# Patient Record
Sex: Female | Born: 1935 | Race: White | Hispanic: No | Smoking: Never smoker
Health system: Southern US, Community
[De-identification: ages and names within clinical notes are randomized; demographics above are authoritative.]

## PROBLEM LIST (undated history)

## (undated) ENCOUNTER — Emergency Department (HOSPITAL_COMMUNITY): Admission: EM | Payer: Self-pay | Source: Home / Self Care

## (undated) DIAGNOSIS — J449 Chronic obstructive pulmonary disease, unspecified: Secondary | ICD-10-CM

## (undated) DIAGNOSIS — I4892 Unspecified atrial flutter: Secondary | ICD-10-CM

## (undated) DIAGNOSIS — K219 Gastro-esophageal reflux disease without esophagitis: Secondary | ICD-10-CM

## (undated) DIAGNOSIS — Z0389 Encounter for observation for other suspected diseases and conditions ruled out: Secondary | ICD-10-CM

## (undated) DIAGNOSIS — C44301 Unspecified malignant neoplasm of skin of nose: Secondary | ICD-10-CM

## (undated) DIAGNOSIS — M25512 Pain in left shoulder: Secondary | ICD-10-CM

## (undated) DIAGNOSIS — I5042 Chronic combined systolic (congestive) and diastolic (congestive) heart failure: Secondary | ICD-10-CM

## (undated) DIAGNOSIS — F419 Anxiety disorder, unspecified: Secondary | ICD-10-CM

## (undated) DIAGNOSIS — I350 Nonrheumatic aortic (valve) stenosis: Secondary | ICD-10-CM

## (undated) DIAGNOSIS — I639 Cerebral infarction, unspecified: Secondary | ICD-10-CM

## (undated) DIAGNOSIS — J189 Pneumonia, unspecified organism: Secondary | ICD-10-CM

## (undated) DIAGNOSIS — M1990 Unspecified osteoarthritis, unspecified site: Secondary | ICD-10-CM

## (undated) DIAGNOSIS — M419 Scoliosis, unspecified: Secondary | ICD-10-CM

## (undated) DIAGNOSIS — I4821 Permanent atrial fibrillation: Secondary | ICD-10-CM

## (undated) DIAGNOSIS — S06309A Unspecified focal traumatic brain injury with loss of consciousness of unspecified duration, initial encounter: Secondary | ICD-10-CM

## (undated) DIAGNOSIS — IMO0001 Reserved for inherently not codable concepts without codable children: Secondary | ICD-10-CM

## (undated) DIAGNOSIS — J309 Allergic rhinitis, unspecified: Secondary | ICD-10-CM

## (undated) DIAGNOSIS — IMO0002 Reserved for concepts with insufficient information to code with codable children: Secondary | ICD-10-CM

## (undated) HISTORY — DX: Permanent atrial fibrillation: I48.21

## (undated) HISTORY — DX: Pain in left shoulder: M25.512

## (undated) HISTORY — PX: TONSILLECTOMY: SUR1361

## (undated) HISTORY — DX: Encounter for observation for other suspected diseases and conditions ruled out: Z03.89

## (undated) HISTORY — PX: MOHS SURGERY: SUR867

## (undated) HISTORY — DX: Nonrheumatic aortic (valve) stenosis: I35.0

## (undated) HISTORY — DX: Reserved for inherently not codable concepts without codable children: IMO0001

## (undated) HISTORY — DX: Chronic combined systolic (congestive) and diastolic (congestive) heart failure: I50.42

## (undated) HISTORY — PX: DILATION AND CURETTAGE OF UTERUS: SHX78

## (undated) HISTORY — DX: Cerebral infarction, unspecified: I63.9

## (undated) HISTORY — PX: CATARACT EXTRACTION W/ INTRAOCULAR LENS  IMPLANT, BILATERAL: SHX1307

## (undated) HISTORY — DX: Unspecified atrial flutter: I48.92

---

## 1997-08-26 ENCOUNTER — Ambulatory Visit (HOSPITAL_COMMUNITY): Admission: RE | Admit: 1997-08-26 | Discharge: 1997-08-26 | Payer: Self-pay | Admitting: Internal Medicine

## 1998-03-21 ENCOUNTER — Ambulatory Visit (HOSPITAL_BASED_OUTPATIENT_CLINIC_OR_DEPARTMENT_OTHER): Admission: RE | Admit: 1998-03-21 | Discharge: 1998-03-21 | Payer: Self-pay | Admitting: Plastic Surgery

## 1998-07-01 HISTORY — PX: SHOULDER ARTHROSCOPY: SHX128

## 1998-09-12 ENCOUNTER — Encounter: Payer: Self-pay | Admitting: Internal Medicine

## 1998-09-12 ENCOUNTER — Ambulatory Visit (HOSPITAL_COMMUNITY): Admission: RE | Admit: 1998-09-12 | Discharge: 1998-09-12 | Payer: Self-pay | Admitting: Internal Medicine

## 1999-01-08 ENCOUNTER — Other Ambulatory Visit: Admission: RE | Admit: 1999-01-08 | Discharge: 1999-01-08 | Payer: Self-pay | Admitting: Internal Medicine

## 1999-09-26 ENCOUNTER — Encounter: Payer: Self-pay | Admitting: Internal Medicine

## 1999-09-26 ENCOUNTER — Ambulatory Visit (HOSPITAL_COMMUNITY): Admission: RE | Admit: 1999-09-26 | Discharge: 1999-09-26 | Payer: Self-pay | Admitting: Internal Medicine

## 2000-01-18 ENCOUNTER — Ambulatory Visit (HOSPITAL_COMMUNITY): Admission: RE | Admit: 2000-01-18 | Discharge: 2000-01-18 | Payer: Self-pay | Admitting: Internal Medicine

## 2000-09-15 ENCOUNTER — Ambulatory Visit (HOSPITAL_COMMUNITY): Admission: RE | Admit: 2000-09-15 | Discharge: 2000-09-15 | Payer: Self-pay | Admitting: General Surgery

## 2001-02-06 ENCOUNTER — Other Ambulatory Visit: Admission: RE | Admit: 2001-02-06 | Discharge: 2001-02-06 | Payer: Self-pay | Admitting: Internal Medicine

## 2001-05-15 ENCOUNTER — Encounter: Admission: RE | Admit: 2001-05-15 | Discharge: 2001-05-15 | Payer: Self-pay | Admitting: Internal Medicine

## 2001-05-15 ENCOUNTER — Encounter: Payer: Self-pay | Admitting: Internal Medicine

## 2001-06-03 ENCOUNTER — Encounter: Admission: RE | Admit: 2001-06-03 | Discharge: 2001-06-03 | Payer: Self-pay | Admitting: Internal Medicine

## 2001-06-03 ENCOUNTER — Encounter: Payer: Self-pay | Admitting: Internal Medicine

## 2001-07-01 HISTORY — PX: HERNIA REPAIR: SHX51

## 2001-12-10 ENCOUNTER — Encounter: Admission: RE | Admit: 2001-12-10 | Discharge: 2001-12-10 | Payer: Self-pay | Admitting: Orthopedic Surgery

## 2001-12-10 ENCOUNTER — Encounter: Payer: Self-pay | Admitting: Orthopedic Surgery

## 2001-12-15 ENCOUNTER — Ambulatory Visit (HOSPITAL_BASED_OUTPATIENT_CLINIC_OR_DEPARTMENT_OTHER): Admission: RE | Admit: 2001-12-15 | Discharge: 2001-12-15 | Payer: Self-pay | Admitting: Orthopedic Surgery

## 2002-07-06 ENCOUNTER — Ambulatory Visit (HOSPITAL_BASED_OUTPATIENT_CLINIC_OR_DEPARTMENT_OTHER): Admission: RE | Admit: 2002-07-06 | Discharge: 2002-07-06 | Payer: Self-pay | Admitting: Orthopaedic Surgery

## 2003-11-15 ENCOUNTER — Other Ambulatory Visit: Admission: RE | Admit: 2003-11-15 | Discharge: 2003-11-15 | Payer: Self-pay | Admitting: Obstetrics and Gynecology

## 2004-02-13 ENCOUNTER — Encounter: Admission: RE | Admit: 2004-02-13 | Discharge: 2004-02-13 | Payer: Self-pay | Admitting: Orthopedic Surgery

## 2004-02-15 ENCOUNTER — Ambulatory Visit (HOSPITAL_COMMUNITY): Admission: RE | Admit: 2004-02-15 | Discharge: 2004-02-15 | Payer: Self-pay | Admitting: Orthopedic Surgery

## 2004-02-15 ENCOUNTER — Ambulatory Visit (HOSPITAL_BASED_OUTPATIENT_CLINIC_OR_DEPARTMENT_OTHER): Admission: RE | Admit: 2004-02-15 | Discharge: 2004-02-15 | Payer: Self-pay | Admitting: Orthopedic Surgery

## 2005-11-08 ENCOUNTER — Encounter: Admission: RE | Admit: 2005-11-08 | Discharge: 2005-11-08 | Payer: Self-pay | Admitting: Internal Medicine

## 2005-11-27 ENCOUNTER — Encounter: Admission: RE | Admit: 2005-11-27 | Discharge: 2005-11-27 | Payer: Self-pay | Admitting: Internal Medicine

## 2006-09-18 ENCOUNTER — Ambulatory Visit (HOSPITAL_COMMUNITY): Admission: RE | Admit: 2006-09-18 | Discharge: 2006-09-18 | Payer: Self-pay | Admitting: Internal Medicine

## 2006-12-25 ENCOUNTER — Encounter: Admission: RE | Admit: 2006-12-25 | Discharge: 2006-12-25 | Payer: Self-pay | Admitting: Internal Medicine

## 2008-01-07 ENCOUNTER — Encounter: Admission: RE | Admit: 2008-01-07 | Discharge: 2008-01-07 | Payer: Self-pay | Admitting: *Deleted

## 2009-01-13 ENCOUNTER — Encounter: Admission: RE | Admit: 2009-01-13 | Discharge: 2009-01-13 | Payer: Self-pay | Admitting: *Deleted

## 2009-06-28 ENCOUNTER — Ambulatory Visit: Payer: Self-pay | Admitting: Internal Medicine

## 2009-06-28 ENCOUNTER — Telehealth: Payer: Self-pay | Admitting: Internal Medicine

## 2009-06-28 ENCOUNTER — Ambulatory Visit (HOSPITAL_BASED_OUTPATIENT_CLINIC_OR_DEPARTMENT_OTHER): Admission: RE | Admit: 2009-06-28 | Discharge: 2009-06-28 | Payer: Self-pay | Admitting: Internal Medicine

## 2009-06-28 ENCOUNTER — Ambulatory Visit: Payer: Self-pay | Admitting: Radiology

## 2009-06-28 DIAGNOSIS — M5430 Sciatica, unspecified side: Secondary | ICD-10-CM

## 2009-06-28 DIAGNOSIS — Z85828 Personal history of other malignant neoplasm of skin: Secondary | ICD-10-CM

## 2009-06-28 DIAGNOSIS — R0989 Other specified symptoms and signs involving the circulatory and respiratory systems: Secondary | ICD-10-CM | POA: Insufficient documentation

## 2009-06-28 DIAGNOSIS — R519 Headache, unspecified: Secondary | ICD-10-CM | POA: Insufficient documentation

## 2009-06-28 DIAGNOSIS — M25519 Pain in unspecified shoulder: Secondary | ICD-10-CM | POA: Insufficient documentation

## 2009-06-28 DIAGNOSIS — J309 Allergic rhinitis, unspecified: Secondary | ICD-10-CM | POA: Insufficient documentation

## 2009-06-28 DIAGNOSIS — R0609 Other forms of dyspnea: Secondary | ICD-10-CM

## 2009-06-28 DIAGNOSIS — R51 Headache: Secondary | ICD-10-CM

## 2009-07-04 ENCOUNTER — Ambulatory Visit: Payer: Self-pay | Admitting: Internal Medicine

## 2009-07-05 ENCOUNTER — Encounter: Payer: Self-pay | Admitting: Internal Medicine

## 2009-07-19 ENCOUNTER — Encounter: Payer: Self-pay | Admitting: Internal Medicine

## 2009-08-03 ENCOUNTER — Encounter: Payer: Self-pay | Admitting: Internal Medicine

## 2009-08-03 ENCOUNTER — Encounter: Admission: RE | Admit: 2009-08-03 | Discharge: 2009-08-03 | Payer: Self-pay | Admitting: Sports Medicine

## 2009-08-28 ENCOUNTER — Encounter: Admission: RE | Admit: 2009-08-28 | Discharge: 2009-08-28 | Payer: Self-pay | Admitting: Sports Medicine

## 2009-10-09 ENCOUNTER — Encounter: Payer: Self-pay | Admitting: Internal Medicine

## 2009-10-09 LAB — CONVERTED CEMR LAB
Basophils Absolute: 0.1 10*3/uL (ref 0.0–0.1)
Basophils Relative: 1 % (ref 0–1)
Bilirubin, Direct: 0.1 mg/dL (ref 0.0–0.3)
Calcium: 9.5 mg/dL (ref 8.4–10.5)
Casts: NONE SEEN /lpf
Cholesterol: 247 mg/dL — ABNORMAL HIGH (ref 0–200)
Eosinophils Absolute: 0.3 10*3/uL (ref 0.0–0.7)
Glucose, Bld: 97 mg/dL (ref 70–99)
HDL: 48 mg/dL (ref >39)
Lymphs Abs: 1 10*3/uL (ref 0.7–4.0)
Monocytes Relative: 16 % — ABNORMAL HIGH (ref 3–12)
Neutro Abs: 5.3 10*3/uL (ref 1.7–7.7)
Platelets: 370 10*3/uL (ref 150–400)
Protein, ur: NEGATIVE mg/dL
RBC / HPF: NONE SEEN (ref <3)
Squamous Epithelial / LPF: NONE SEEN /lpf
Total Bilirubin: 0.5 mg/dL (ref 0.3–1.2)
Total CHOL/HDL Ratio: 5.1
Triglycerides: 150 mg/dL — ABNORMAL HIGH (ref <150)
Urine Glucose: NEGATIVE mg/dL
Urobilinogen, UA: 0.2 (ref 0.0–1.0)
WBC: 8 10*3/uL (ref 4.0–10.5)
pH: 6.5 (ref 5.0–8.0)

## 2009-10-10 ENCOUNTER — Telehealth: Payer: Self-pay | Admitting: Internal Medicine

## 2009-10-12 ENCOUNTER — Ambulatory Visit (HOSPITAL_BASED_OUTPATIENT_CLINIC_OR_DEPARTMENT_OTHER): Admission: RE | Admit: 2009-10-12 | Discharge: 2009-10-12 | Payer: Self-pay | Admitting: Internal Medicine

## 2009-10-12 ENCOUNTER — Telehealth: Payer: Self-pay | Admitting: Internal Medicine

## 2009-10-12 ENCOUNTER — Ambulatory Visit: Payer: Self-pay | Admitting: Internal Medicine

## 2009-10-12 ENCOUNTER — Ambulatory Visit: Payer: Self-pay | Admitting: Interventional Radiology

## 2009-10-12 DIAGNOSIS — R079 Chest pain, unspecified: Secondary | ICD-10-CM

## 2009-10-25 ENCOUNTER — Ambulatory Visit (HOSPITAL_COMMUNITY): Admission: RE | Admit: 2009-10-25 | Discharge: 2009-10-25 | Payer: Self-pay | Admitting: Internal Medicine

## 2009-10-26 ENCOUNTER — Telehealth (INDEPENDENT_AMBULATORY_CARE_PROVIDER_SITE_OTHER): Payer: Self-pay | Admitting: *Deleted

## 2009-10-31 ENCOUNTER — Ambulatory Visit (HOSPITAL_COMMUNITY): Admission: RE | Admit: 2009-10-31 | Discharge: 2009-10-31 | Payer: Self-pay | Admitting: Internal Medicine

## 2009-10-31 ENCOUNTER — Ambulatory Visit: Payer: Self-pay

## 2009-10-31 ENCOUNTER — Ambulatory Visit: Payer: Self-pay | Admitting: Cardiology

## 2009-11-02 ENCOUNTER — Ambulatory Visit: Payer: Self-pay | Admitting: Internal Medicine

## 2009-11-03 ENCOUNTER — Encounter: Payer: Self-pay | Admitting: Internal Medicine

## 2009-11-06 ENCOUNTER — Telehealth: Payer: Self-pay | Admitting: Internal Medicine

## 2009-11-16 ENCOUNTER — Ambulatory Visit: Payer: Self-pay | Admitting: Internal Medicine

## 2009-11-16 LAB — CONVERTED CEMR LAB

## 2010-01-03 ENCOUNTER — Encounter: Admission: RE | Admit: 2010-01-03 | Discharge: 2010-01-03 | Payer: Self-pay | Admitting: Internal Medicine

## 2010-01-08 ENCOUNTER — Telehealth: Payer: Self-pay | Admitting: Internal Medicine

## 2010-01-09 ENCOUNTER — Encounter: Payer: Self-pay | Admitting: Internal Medicine

## 2010-01-09 ENCOUNTER — Encounter: Admission: RE | Admit: 2010-01-09 | Discharge: 2010-01-09 | Payer: Self-pay | Admitting: Internal Medicine

## 2010-03-29 ENCOUNTER — Ambulatory Visit: Payer: Self-pay | Admitting: Internal Medicine

## 2010-03-29 DIAGNOSIS — F411 Generalized anxiety disorder: Secondary | ICD-10-CM

## 2010-03-29 LAB — CONVERTED CEMR LAB
Calcium: 9.6 mg/dL (ref 8.4–10.5)
Creatinine, Ser: 0.79 mg/dL (ref 0.40–1.20)
HCT: 45.1 % (ref 36.0–46.0)
MCHC: 33.5 g/dL (ref 30.0–36.0)
Magnesium: 2.1 mg/dL (ref 1.5–2.5)
Platelets: 283 10*3/uL (ref 150–400)
WBC: 8.1 10*3/uL (ref 4.0–10.5)

## 2010-03-30 ENCOUNTER — Encounter: Payer: Self-pay | Admitting: Internal Medicine

## 2010-04-04 ENCOUNTER — Ambulatory Visit: Payer: Self-pay | Admitting: Internal Medicine

## 2010-04-05 ENCOUNTER — Telehealth: Payer: Self-pay | Admitting: Internal Medicine

## 2010-04-09 ENCOUNTER — Telehealth: Payer: Self-pay | Admitting: Internal Medicine

## 2010-04-18 ENCOUNTER — Encounter: Payer: Self-pay | Admitting: Internal Medicine

## 2010-04-18 ENCOUNTER — Ambulatory Visit: Payer: Self-pay | Admitting: Internal Medicine

## 2010-05-01 ENCOUNTER — Telehealth: Payer: Self-pay | Admitting: Internal Medicine

## 2010-05-18 ENCOUNTER — Ambulatory Visit: Payer: Self-pay | Admitting: Internal Medicine

## 2010-05-18 ENCOUNTER — Encounter: Payer: Self-pay | Admitting: Internal Medicine

## 2010-05-23 ENCOUNTER — Telehealth: Payer: Self-pay | Admitting: Internal Medicine

## 2010-05-31 DIAGNOSIS — M25512 Pain in left shoulder: Secondary | ICD-10-CM

## 2010-05-31 HISTORY — DX: Pain in left shoulder: M25.512

## 2010-06-08 ENCOUNTER — Encounter
Admission: RE | Admit: 2010-06-08 | Discharge: 2010-06-08 | Payer: Self-pay | Source: Home / Self Care | Attending: Sports Medicine | Admitting: Sports Medicine

## 2010-06-08 ENCOUNTER — Encounter: Payer: Self-pay | Admitting: Internal Medicine

## 2010-06-26 ENCOUNTER — Encounter: Payer: Self-pay | Admitting: Internal Medicine

## 2010-06-27 ENCOUNTER — Telehealth: Payer: Self-pay | Admitting: Internal Medicine

## 2010-06-29 ENCOUNTER — Encounter
Admission: RE | Admit: 2010-06-29 | Discharge: 2010-06-29 | Payer: Self-pay | Source: Home / Self Care | Attending: Internal Medicine | Admitting: Internal Medicine

## 2010-06-29 LAB — HM MAMMOGRAPHY

## 2010-07-03 ENCOUNTER — Telehealth: Payer: Self-pay | Admitting: Internal Medicine

## 2010-07-12 ENCOUNTER — Ambulatory Visit
Admission: RE | Admit: 2010-07-12 | Discharge: 2010-07-12 | Payer: Self-pay | Source: Home / Self Care | Attending: Internal Medicine | Admitting: Internal Medicine

## 2010-07-22 ENCOUNTER — Encounter: Payer: Self-pay | Admitting: Internal Medicine

## 2010-07-22 ENCOUNTER — Encounter: Payer: Self-pay | Admitting: Obstetrics and Gynecology

## 2010-07-22 ENCOUNTER — Encounter: Payer: Self-pay | Admitting: *Deleted

## 2010-08-02 NOTE — Progress Notes (Signed)
Summary: Blood pressure status update  Phone Note Call from Patient Call back at Home Phone [phone]   Caller: Patient Call For: D. Thomos Lemons DO Summary of Call: patient called and left voice message stating she was advised to call back regarding her blood pressure. She states her blood pressure was 100/80, 81/64, 133/65 on Saturday and yesterday 106/71. Her message states the medication is causing her severe fatigue and she wanted to know if she should continue with taking the medication as prescribed or should she stop Initial call taken by: Glendell Docker CMA,  July 03, 2010 11:50 AM  Follow-up for Phone Call        I suggest pt go back to taking 25 mg of metoprolol.  needs OV within 1 week Follow-up by: D. Thomos Lemons DO,  July 03, 2010 6:12 PM  Additional Follow-up for Phone Call Additional follow up Details #1::        call was returned to patient at [phone], she has been informed per Dr Artist Pais instructions. She has scheduled follow up for Thursday 1/12 @ 11 am with Dr Artist Pais Additional Follow-up by: Glendell Docker CMA,  July 04, 2010 10:54 AM    New/Updated Medications: METOPROLOL SUCCINATE 25 MG XR24H-TAB (METOPROLOL SUCCINATE) one by mouth once daily Prescriptions: METOPROLOL SUCCINATE 25 MG XR24H-TAB (METOPROLOL SUCCINATE) one by mouth once daily  #30 x 1   Entered and Authorized by:   D. Thomos Lemons DO   Signed by:   D. Thomos Lemons DO on 07/03/2010   Method used:   Electronically to        [id number] (retail)       [address]       [place], [place]  [address]       Ph: [phone] or [phone]       Fax: [phone]   RxID:   [id number]

## 2010-08-02 NOTE — Miscellaneous (Signed)
Summary: Orders Update pft charges  Clinical Lists Changes  Orders: Added new Service order of Carbon Monoxide diffusing w/capacity (94720) - Signed Added new Service order of Lung Volumes (94240) - Signed Added new Service order of Spirometry (Pre & Post) (94060) - Signed 

## 2010-08-02 NOTE — Progress Notes (Signed)
Summary: Mammogram Follow up   Phone Note Outgoing Call   Summary of Call: please make sure pt was contacted re:  abnormal mammogram Initial call taken by: D. Thomos Lemons DO,  January 08, 2010 11:57 PM  Follow-up for Phone Call        call placed to patient at [phone], she states she was contacted, and is scheduled to follow up today 01/09/2010. She was asked to call back with an update when completed. Patient verbalized understanding and agrees Follow-up by: Glendell Docker CMA,  January 09, 2010 8:41 AM

## 2010-08-02 NOTE — Progress Notes (Signed)
  Phone Note Outgoing Call   Summary of Call: call pt - bone scan and rib xray suggests left 11 th rib fracture Initial call taken by: D. Thomos Lemons DO,  October 26, 2009 8:10 AM  Follow-up for Phone Call        Pt aware Follow-up by: Payton Spark CMA,  October 26, 2009 9:12 AM

## 2010-08-02 NOTE — Op Note (Signed)
Summary: Lumbar Injection/[facility]  Lumbar Injection/[facility]   Imported By: Lanelle Bal 07/31/2009 13:41:29  _____________________________________________________________________  External Attachment:    Type:   Image     Comment:   External Document

## 2010-08-02 NOTE — Assessment & Plan Note (Signed)
Summary: RIGHT LEG PAIN/HEA   Vital Signs:  Patient profile:   77 year old female Weight:      148.75 pounds BMI:     28.21 O2 Sat:      98 % on Room air Temp:     97.8 degrees F oral Pulse rate:   72 / minute Pulse rhythm:   regular Resp:     18 per minute BP sitting:   126 / 72  (right arm) Cuff size:   regular  Vitals Entered By: Glendell Docker CMA (July 04, 2009 9:11 AM)  O2 Flow:  Room air  Primary Care Provider:  D. Thomos Lemons DO  CC:  Right Leg Pain.  History of Present Illness: Right Leg Pain- form knee down to thigh sharp constant pain for the past 2 weeks with increase in intensity.  no sign improvement with prednisone.  Preventive Screening-Counseling & Management  Alcohol-Tobacco     Alcohol drinks/day: 1     Alcohol type: all     Alcohol Counseling: not indicated; patient does not drink     Smoking Status: never     Tobacco Counseling: not indicated; no tobacco use  Caffeine-Diet-Exercise     Caffeine use/day: None     Caffeine Counseling: not indicated; caffeine use is not excessive or problematic     Does Patient Exercise: yes     Type of exercise: walking  Allergies (verified): No Known Drug Allergies  Past History:  Past Medical History: Chickenpox Allergic rhinitis Skin cancer, hx of (nose)  Headache  Past Surgical History: Hernia 2003   Social History: Caffeine use/day:  None  Physical Exam  General:  alert, well-developed, and well-nourished.   Lungs:  normal respiratory effort, normal breath sounds, no crackles, and no wheezes.   Heart:  normal rate, regular rhythm, and no gallop.   Msk:  tenderness of right sciatic notch.  mild decrease in ROM of left shoulder  right knee - normal ROM,  no swelling,  no joint instabilty Neurologic:  right patellar reflex is diminished.   Impression & Recommendations:  Problem # 1:  SCIATICA, RIGHT (ICD-724.3) Assessment Unchanged  no improvement with prednisone.  she reports right  leg is getting weaker.  patellar reflex is diminished on right side.  arrange MRI of LS spine.  she is claustrophobic.  low dose alprazolam 15-20 mins before MRI.    Orders: Orthopedic Referral (Ortho) Radiology Referral (Radiology)  Problem # 2:  SHOULDER PAIN, LEFT (ICD-719.41)  left shoulder x ray shows - There are prominent degenerative changes of the [facility] joint with preservation of the subacromial space.  If there is clinical concern regarding a rotator cuff injury, MRI may be of help.  she has hx of right shoulder surgery.  she has been relying on left arm.    Refer to [facility] ortho for further evaluation.  I would like for them to also review MRI of LS spine and evaluate right sciatica  Orders: Orthopedic Referral (Ortho)  Complete Medication List: 1)  Multivitamins Tabs (Multiple vitamin) .... Once daily 2)  Calcium 1500 Mg Tabs (Calcium carbonate) .... Once daily 3)  Fexofenadine Hcl 180 Mg Tabs (Fexofenadine hcl) .Marland Kitchen.. 1 once daily as needed allergies 4)  Prednisone 10 Mg Tabs (Prednisone) .... 3 tabs by mouth once daily x 3 days, 2 tabs by mouth once daily x 3 days, 1 tab by mouth once daily x 3 days 5)  Alprazolam 0.25 Mg Tabs (Alprazolam) .... One tab  15-20 mins before mri Prescriptions: ALPRAZOLAM 0.25 MG TABS (ALPRAZOLAM) one tab 15-20 mins before MRI  #5 x 0   Entered and Authorized by:   D. Thomos Lemons DO   Signed by:   D. Thomos Lemons DO on 07/04/2009   Method used:   Print then Give to Patient   RxID:   [id number]    Immunization History:  Tetanus/Td Immunization History:    Tetanus/Td:  historical (10/13/2000)  Influenza Immunization History:    Influenza:  historical (05/16/2009)  Pneumovax Immunization History:    Pneumovax:  historical (11/14/1998)  Zostavax History:    Zostavax # 1:  zostavax (01/20/2007)    Preventive Care Screening  Last Flu Shot:    Date:  05/16/2009    Results:  Historical   Bone Density:    Date:  03/24/2007     Results:  normal std dev  Colonoscopy:    Date:  03/24/2007    Results:  normal   Last Tetanus Booster:    Date:  10/13/2000    Results:  Historical   Last Pneumovax:    Date:  11/14/1998    Results:  Historical    Current Allergies (reviewed today): No known allergies

## 2010-08-02 NOTE — Assessment & Plan Note (Signed)
Summary: FOLLOW UP/[facility]   Vital Signs:  Patient profile:   77 year old female Height:      61 inches Weight:      143 pounds BMI:     27.12 O2 Sat:      96 % on Room air Pulse rate:   78 / minute BP sitting:   130 / 82  (left arm) Cuff size:   regular  Vitals Entered By: Payton Spark CMA (May 18, 2010 10:07 AM)  O2 Flow:  Room air CC: 1 mo f/u.    Primary Care Provider:  [name] DO  CC:  1 mo f/u. Marland Kitchen  History of Present Illness:  77 y/o white female with htn and afib for follow pt not taking aspirin regularly.  some stomach  upset  BP at home 130/82  Current Medications (verified): 1)  Fexofenadine Hcl 180 Mg Tabs (Fexofenadine Hcl) .... Take 1 Tablet By Mouth Once A Day As Needed 2)  Metoprolol Succinate 25 Mg Xr24h-Tab (Metoprolol Succinate) .... One By Mouth Once Daily 3)  Ecotrin 325 Mg Tbec (Aspirin) .... One By Mouth Once Daily  Allergies (verified): No Known Drug Allergies  Past History:  Past Medical History: Chickenpox  Allergic rhinitis  Skin cancer, hx of (nose)    Headache  Diagnosed with mild asthma 2000 - tried advair after bronchospasm after exposure to cats  Past Surgical History: Hernia 2003    Decompression, left median nerve  Right shoulder arthroscopic acromioplasty  Family History: Unknown       Social History: Married Never Smoked Alcohol use-yes Drug use-no Regular exercise-yes        Physical Exam  General:  alert, well-developed, and well-nourished.   Lungs:  normal respiratory effort and normal breath sounds.   Heart:  normal rate, no gallop, and irregular rhythm.   Extremities:  No lower extremity edema   Impression & Recommendations:  Problem # 1:  ATRIAL FIBRILLATION (ICD-427.31) Assessment Unchanged pt not able to take aspirin consistently due to stomach upset rate is controlled pt advised to reconsider taking coumadin   The following medications were removed from the medication list:  Ecotrin 325 Mg Tbec (Aspirin) ..... One by mouth once daily Her updated medication list for this problem includes:    Metoprolol Succinate 50 Mg Xr24h-tab (Metoprolol succinate) ..... One by mouth once daily  Problem # 2:  ALLERGIC RHINITIS (ICD-477.9) Assessment: Improved  Her updated medication list for this problem includes:    Fexofenadine Hcl 180 Mg Tabs (Fexofenadine hcl) .Marland Kitchen... Take 1 tablet by mouth once a day as needed  Complete Medication List: 1)  Fexofenadine Hcl 180 Mg Tabs (Fexofenadine hcl) .... Take 1 tablet by mouth once a day as needed 2)  Metoprolol Succinate 50 Mg Xr24h-tab (Metoprolol succinate) .... One by mouth once daily  Other Orders: EKG w/ Interpretation (93000)  Patient Instructions: 1)  Please schedule a follow-up appointment in 2 months. Prescriptions: FEXOFENADINE HCL 180 MG TABS (FEXOFENADINE HCL) Take 1 tablet by mouth once a day as needed  #90 x 3   Entered and Authorized by:   D. Thomos Lemons DO   Signed by:   D. Thomos Lemons DO on 05/18/2010   Method used:   Electronically to        [facility]* (retail)             ,          Ph: [phone]  Fax: [phone]   RxID:   [id number] METOPROLOL SUCCINATE 50 MG XR24H-TAB (METOPROLOL SUCCINATE) one by mouth once daily  #30 x 3   Entered and Authorized by:   D. Thomos Lemons DO   Signed by:   D. Thomos Lemons DO on 05/18/2010   Method used:   Electronically to        [id number] (retail)       [address]       [place], [place]  [address]       Ph: [phone] or [phone]       Fax: [phone]   RxID:   [id number]    Orders Added: 1)  EKG w/ Interpretation [93000] 2)  Est. Patient Level III [84166]

## 2010-08-02 NOTE — Assessment & Plan Note (Signed)
Summary: BLOOD PRESSURE F/U-DK   Vital Signs:  Patient profile:   77 year old female Height:      61 inches Weight:      145.50 pounds BMI:     27.59 O2 Sat:      95 % on Room air Temp:     97.9 degrees F oral Pulse rate:   77 / minute Resp:     18 per minute BP sitting:   122 / 70  (right arm) Cuff size:   regular  Vitals Entered By: Glendell Docker CMA (July 12, 2010 11:35 AM)  O2 Flow:  Room air CC: Blood Pressure follow up, atrial fibrillation follow-up Is Patient Diabetic? No Pain Assessment Patient in pain? no      Comments discuss blood pressure, and would like to stop taking medication, she tore left rotator cuff on December 6th, she states she is having severe     Primary Care Provider:  D. Thomos Lemons DO  CC:  Blood Pressure follow up and atrial fibrillation follow-up.  History of Present Illness:       This is an 77 year old female who presents for atrial fibrillation follow-up.  The patient has no history of palpitations, shortness of breath, chest pain with exertion, and syncope.  The patient reports not on coumadin-patient refused.  metoprolol causing fatigue  injured left rotator cuff 12/6  Preventive Screening-Counseling & Management  Alcohol-Tobacco     Smoking Status: never  Allergies (verified): No Known Drug Allergies  Past History:  Past Medical History: Chickenpox  Allergic rhinitis  Skin cancer, hx of (nose)    Headache   Diagnosed with mild asthma 2000 - tried advair after bronchospasm after exposure to cats  Past Surgical History: Hernia 2003    Decompression, left median nerve  Right shoulder arthroscopic acromioplasty   Family History: Unknown        Social History: Married Never Smoked Alcohol use-yes  Drug use-no Regular exercise-yes        Physical Exam  General:  alert, well-developed, and well-nourished.   Lungs:  normal respiratory effort and normal breath sounds.   Heart:  normal rate, no gallop, and  irregular rhythm.   Extremities:  No lower extremity edema   Impression & Recommendations:  Problem # 1:  ATRIAL FIBRILLATION (ICD-427.31) pt having difficulty tolerating metoprolol due to fatigue and ? skin irritation change to bisoprolol pt does not want to take coumadin she stopped aspirin due to stomach upset  The following medications were removed from the medication list:    Metoprolol Succinate 25 Mg Xr24h-tab (Metoprolol succinate) ..... One by mouth once daily Her updated medication list for this problem includes:    Bisoprolol Fumarate 5 Mg Tabs (Bisoprolol fumarate) .Marland Kitchen... 1/2 tab by mouth once daily  Complete Medication List: 1)  Bisoprolol Fumarate 5 Mg Tabs (Bisoprolol fumarate) .... 1/2 tab by mouth once daily  Patient Instructions: 1)  Please schedule a follow-up appointment in 1 month. Prescriptions: BISOPROLOL FUMARATE 5 MG TABS (BISOPROLOL FUMARATE) 1/2 tab by mouth once daily  #30 x 2   Entered and Authorized by:   D. Thomos Lemons DO   Signed by:   D. Thomos Lemons DO on 07/12/2010   Method used:   Electronically to        [id number] (retail)       [address]       [place], [place]  [address]       Ph: [phone]  or [phone]       Fax: [phone]   RxID:   [id number]    Orders Added: 1)  Est. Patient Level III [47096]    Current Allergies (reviewed today): No known allergies

## 2010-08-02 NOTE — Miscellaneous (Signed)
Summary: Orders Update  Clinical Lists Changes  Problems: Added new problem of HEALTH MAINTENANCE EXAM (ICD-V70.0) Orders: Added new Test order of TLB-CBC Platelet - w/Differential (85025-CBCD) - Signed Added new Test order of TLB-BMP (Basic Metabolic Panel-BMET) (80048-METABOL) - Signed Added new Test order of TLB-Lipid Panel (80061-LIPID) - Signed Added new Test order of TLB-Hepatic/Liver Function Pnl (80076-HEPATIC) - Signed Added new Test order of TLB-TSH (Thyroid Stimulating Hormone) (84443-TSH) - Signed Added new Test order of T-Urinalysis (04540-98119) - Signed

## 2010-08-02 NOTE — Assessment & Plan Note (Signed)
Summary: Follow up [facility]   Vital Signs:  Patient profile:   77 year old female Height:      61 inches Weight:      141 pounds BMI:     26.74 O2 Sat:      93 % on Room air Temp:     97.9 degrees F oral Pulse rate:   76 / minute Pulse rhythm:   regular Resp:     26 per minute BP sitting:   120 / 70  (right arm)  Vitals Entered By: Glendell Docker CMA (October 12, 2009 2:40 PM)  O2 Flow:  Room air CC: Rm 2- CPX   Primary Care Provider:  [name] DO  CC:  Rm 2- CPX.  History of Present Illness: 77 y/o white female for f/u.   pt seen by ortho. MRI of LS spine reviewed she had several epidual injection with no sign improvement pain now intermittent. worse with prolonged sitting  also c/o left upper rib pain.  upper and lower ribs are tender to touch no rash  Allergies (verified): No Known Drug Allergies  Past History:  Past Medical History: Chickenpox Allergic rhinitis Skin cancer, hx of (nose)   Headache  Past Surgical History: Hernia 2003    Social History: Married Never Smoked Alcohol use-yes Drug use-no Regular exercise-yes    Review of Systems       she c/o chronic fatigue  Physical Exam  General:  alert, well-developed, and well-nourished.   Chest Wall:  left upper rib pain near axilla Lungs:  normal respiratory effort, normal breath sounds, no crackles, and no wheezes.   Heart:  normal rate, regular rhythm, no murmur, and no gallop.   Abdomen:  soft, non-tender, and normal bowel sounds.   Msk:  tenderness along sacral notch / piriformis muscle Extremities:  No lower extremity edema  Psych:  normally interactive, good eye contact, not anxious appearing, and not depressed appearing.     Impression & Recommendations:  Problem # 1:  RIB PAIN, LEFT SIDED (ICD-786.50) Pt notes left upper rib tenderness.  no skin rash.  check cxr and bone scan.  she notes chronic fatigue Orders: T-2 View CXR, Same Day (71020.5TC) Radiology Referral  (Radiology)  Problem # 2:  DYSPNEA ON EXERTION (ICD-786.09) Persistent DOE.  check 2 D Echo and PFTs  Orders: Echo Referral (Echo) Pulmonary Referral (Pulmonary)  Problem # 3:  SCIATICA, RIGHT (ICD-724.3) Assessment: Unchanged Pt seen by ortho.  no signifcant improvement with epidural injections.  trial of low dose gabapentin.  we discussed stretching exercises for piriformis  Complete Medication List: 1)  Multivitamins Tabs (Multiple vitamin) .... Once daily 2)  Calcium 1500 Mg Tabs (Calcium carbonate) .... Once daily 3)  Fexofenadine Hcl 180 Mg Tabs (Fexofenadine hcl) .Marland Kitchen.. 1 once daily as needed allergies 4)  Gabapentin 100 Mg Caps (Gabapentin) .... One by mouth qhs  Patient Instructions: 1)  Please schedule a follow-up appointment in 2 months. Prescriptions: GABAPENTIN 100 MG CAPS (GABAPENTIN) one by mouth qhs  #30 x 2   Entered and Authorized by:   D. Thomos Lemons DO   Signed by:   D. Thomos Lemons DO on 10/12/2009   Method used:   Electronically to        [facility] (retail)       [address]       [place], [place]  [address]       Ph: [phone] or [phone]  Fax: [phone]   RxID:   [id number]   Current Allergies (reviewed today): No known allergies

## 2010-08-02 NOTE — Assessment & Plan Note (Signed)
Summary: np6/ flutter./ pt has medicare. uhc/ gd   Visit Type:  Initial Consult/ a flutter Primary Ellyn Rubiano:  [name] DO  CC:  asa bothers pt stomach- sob.  History of Present Illness: Caroline Myers is referred today by Dr. Artist Pais for evaluation of atrial fibrillation.  She has been well in the past.  She denies HTN, thyroid problems ETOH abuse or CHF.  The patient does not have palpitations.  She does have some subjective dyspnea at times but also notes that she walks about 30 minutes a day 4-7 times a week.  She was found on routine followup to be in atrial fibrillation.  She is referred for additional evaluation.  She has not had syncope.  No peripheral edema.  A recent 2D echo demonstrated normal LV function with mild LAE. No valvular problems. Note a normal TSH.  Current Medications (verified): 1)  Multivitamins  Tabs (Multiple Vitamin) .... Once Daily 2)  Fexofenadine Hcl 180 Mg Tabs (Fexofenadine Hcl) .... Take 1 Tablet By Mouth Once A Day As Needed 3)  Eye-Vite Plus Lutein  Caps (Multiple Vitamins-Minerals) .... Take 1 Capsule By Mouth Two Times A Day 4)  Proair Hfa 108 (90 Base) Mcg/act Aers (Albuterol Sulfate) .... 2 Puffs 4 Times A Day As Needed 5)  Diltiazem Hcl 30 Mg Tabs (Diltiazem Hcl) .... One By Mouth Two Times A Day  Allergies (verified): No Known Drug Allergies  Past History:  Past Medical History: Last updated: 11/16/2009 Chickenpox Allergic rhinitis  Skin cancer, hx of (nose)   Headache Diagnosed with mild asthma 2000 - tried advair after bronchospasm after exposure to cats  Past Surgical History: Last updated: 04/03/2010 Hernia 2003    Decompression, left median nerve Right shoulder arthroscopic acromioplasty  Family History: Last updated: 11/16/2009 Unknown   Social History: Last updated: 11/16/2009 Married Never Smoked Alcohol use-yes Drug use-no Regular exercise-yes     Review of Systems       All systems reviewed and negative except as  noted in the HPI.  Vital Signs:  Patient profile:   77 year old female Height:      61 inches Weight:      142 pounds BMI:     26.93 Pulse rate:   811 / minute BP sitting:   110 / 69  (left arm) Cuff size:   regular  Vitals Entered By: Burnett Kanaris, CNA (April 04, 2010 9:38 AM)  Physical Exam  General:  alert, well-developed, and well-nourished.   Head:  normocephalic and atraumatic.   Eyes:  pupils equal, pupils round, and pupils reactive to light.   Mouth:  Oral mucosa and oropharynx without lesions or exudates.  Neck:  No deformities, masses, or tenderness noted.no carotid bruits.   Lungs:  normal respiratory effort, normal breath sounds, no crackles, and no wheezes.   Heart:  normal rate, regular rhythm, no murmur, and no gallop.   Abdomen:  soft, non-tender, and normal bowel sounds.   Pulses:  pulses normal in all 4 extremities Extremities:  No clubbing or cyanosis. Neurologic:  Alert and oriented x 3.   EKG  Procedure date:  04/04/2010  Findings:      Atrial fibrillation with a controlled ventricular response rate of: 81.  Impression & Recommendations:  Problem # 1:  ATRIAL FIBRILLATION (ICD-427.31) Her symptoms are minimal at this point.   I have discussed in detail the importance of rate control and prevention of stroke.  I have recommended we change her cardizem to  120/day (long acting). Also, I have discussed the different anti-coagulation options with the patient and have recommended she start coumadin.  She will be referred initially to our coumadin clinic though Dr. Artist Pais may wish to manage this in the future.  Of note the computer generator interpretation of her arrhythmia was atrial flutter and while she may also have flutter, her rhythm is coarse atrial fibrillation. The following medications were removed from the medication list:    Sb Aspirin Ec 325 Mg Tbec (Aspirin) ..... One by mouth once daily Her updated medication list for this problem includes:     Warfarin Sodium 3 Mg Tabs (Warfarin sodium) ..... Once daily  Problem # 2:  DYSPNEA ON EXERTION (ICD-786.09) Her symptoms appear to be very mild.  Her atrial fib could be partially responsible and to this end I have asked her to increase the cardizem to hopefully improve her rate control and reduce her very mild dyspnea. The following medications were removed from the medication list:    Sb Aspirin Ec 325 Mg Tbec (Aspirin) ..... One by mouth once daily    Diltiazem Hcl 30 Mg Tabs (Diltiazem hcl) ..... One by mouth two times a day Her updated medication list for this problem includes:    Cardizem Cd 120 Mg Xr24h-cap (Diltiazem hcl coated beads) ..... Once daily  Other Orders: EKG w/ Interpretation (93000)  Patient Instructions: 1)  Your physician recommends that you schedule a follow-up appointment in: 4 months 2)  Your physician has recommended you make the following change in your medication: Start Coumadin 3 mg daily. Coumadin clinic will call you to schedule an appointment to check blood work. Stop Diltiazem. Start Cardizem 120 mg dialy.  Prescriptions: CARDIZEM CD 120 MG XR24H-CAP (DILTIAZEM HCL COATED BEADS) once daily  #90 x 3   Entered by:   Claris Gladden RN   Authorized by:   Laren Boom, MD, [facility]   Signed by:   Claris Gladden RN on 04/04/2010   Method used:   Electronically to        [id number] (retail)       [address]       [place], [place]  [address]       Ph: [phone] or [phone]       Fax: [phone]   RxID:   [id number] WARFARIN SODIUM 3 MG TABS (WARFARIN SODIUM) once daily  #90 x 3   Entered by:   Claris Gladden RN   Authorized by:   Laren Boom, MD, [facility]   Signed by:   Claris Gladden RN on 04/04/2010   Method used:   Electronically to        [facility] (retail)       [address]       [place], [place]  [address]       Ph: [phone] or [phone]       Fax: [phone]   RxID:   [id number]

## 2010-08-02 NOTE — Assessment & Plan Note (Signed)
Summary: 1 month follow up/mhf   Vital Signs:  Patient profile:   77 year old female Weight:      142.50 pounds BMI:     27.02 O2 Sat:      95 % on Room air Temp:     98.0 degrees F oral Pulse rate:   67 / minute Pulse rhythm:   irregular Resp:     22 per minute BP sitting:   110 / 70  (left arm) Cuff size:   regular  Vitals Entered By: Glendell Docker CMA (April 18, 2010 10:09 AM)  O2 Flow:  Room air CC: 1 month follow up Is Patient Diabetic? No Pain Assessment Patient in pain? no        Primary Care [place] Stannard:  [name] DO  CC:  1 month follow up.  History of Present Illness: 77 y/o white female for f/u pt prev found to have afib / aflutter pt seen by Dr. Ladona Ridgel he recommended pt start coumadin pt having reservations about starting anticoagulation  pt started on cardizem she did not switch to once daily cardizem  she is still having intermittent shortness of breath  Preventive Screening-Counseling & Management  Alcohol-Tobacco     Smoking Status: never  Allergies: No Known Drug Allergies  Past History:  Past Medical History: Chickenpox  Allergic rhinitis  Skin cancer, hx of (nose)    Headache Diagnosed with mild asthma 2000 - tried advair after bronchospasm after exposure to cats  Family History: Unknown      Social History: Married Never Smoked Alcohol use-yes Drug use-no Regular exercise-yes       Physical Exam  General:  alert, well-developed, and well-nourished.   Lungs:  normal respiratory effort and normal breath sounds.   Heart:  normal rate, irregular rhythm, and no gallop.   Extremities:  No lower extremity edema   Impression & Recommendations:  Problem # 1:  ATRIAL FIBRILLATION (ICD-427.31) pt does not want to take coumadin.  we discussed risk of stroke.  [place] score is 1.  use aspirin.   pt never started higher dose of cardizem.   switch to metoprolol for rate control.  The following medications were removed from  the medication list:    Warfarin Sodium 3 Mg Tabs (Warfarin sodium) ..... Once daily    Cardizem Cd 120 Mg Xr24h-cap (Diltiazem hcl coated beads) ..... Once daily Her updated medication list for this problem includes:    Metoprolol Succinate 25 Mg Xr24h-tab (Metoprolol succinate) ..... One by mouth once daily    Ecotrin 325 Mg Tbec (Aspirin) ..... One by mouth once daily  Orders: EKG w/ Interpretation (93000)  Complete Medication List: 1)  Fexofenadine Hcl 180 Mg Tabs (Fexofenadine hcl) .... Take 1 tablet by mouth once a day as needed 2)  Metoprolol Succinate 25 Mg Xr24h-tab (Metoprolol succinate) .... One by mouth once daily 3)  Ecotrin 325 Mg Tbec (Aspirin) .... One by mouth once daily  Patient Instructions: 1)  Please schedule a follow-up appointment in 1 month. Prescriptions: METOPROLOL SUCCINATE 25 MG XR24H-TAB (METOPROLOL SUCCINATE) one by mouth once daily  #30 x 2   Entered and Authorized by:   D. Thomos Lemons DO   Signed by:   D. Thomos Lemons DO on 04/18/2010   Method used:   Electronically to        [facility] (ret[facility])       [address]       [place], [place]  [address]  Ph: [phone] or [phone]       Fax: [phone]   RxID:   [id number]    Orders Added: 1)  EKG w/ Interpretation [93000] 2)  Est. Patient Level III [88416]

## 2010-08-02 NOTE — Progress Notes (Signed)
Summary: Discontinue with Metoprolo  Phone Note Call from Patient Call back at Home Phone [phone]   Caller: Patient Call For: D. Thomos Lemons DO Reason for Call: Referral Summary of Call: patient called and left voice message stating she would like to stop taking the medication Metoprolol that was prescribed for A-Fib. Her message states that she has had a increase over the past month in itching, light headed, and and she has not been sleeping well since she has started the medication.  Initial call taken by: Glendell Docker CMA,  June 27, 2010 2:22 PM  Follow-up for Phone Call        (pharmacy was contacted and corrected refills on below medication to 0 refills)  Please call patient and let her know that she may stop metoprolol, but should go back on cardizem as listed below and follow up with Dr.  Artist Pais in 2 weeks.  Check BP daily, call if BP < 100/80. Follow-up by: Lemont Fillers FNP,  June 27, 2010 4:41 PM  Additional Follow-up for Phone Call Additional follow up Details #1::        call was returned to patient at [phone], she has been advised per Sandford Craze instructions. Patient has verbalized understanding and agrees as instructed Additional Follow-up by: Glendell Docker CMA,  June 27, 2010 4:48 PM    New/Updated Medications: CARDIZEM 30 MG TABS (DILTIAZEM HCL) one tablet by mouth two times a day Prescriptions: CARDIZEM 30 MG TABS (DILTIAZEM HCL) one tablet by mouth two times a day  #60 x 9   Entered and Authorized by:   Lemont Fillers FNP   Signed by:   Lemont Fillers FNP on 06/27/2010   Method used:   Electronically to        [facility]       [address]       [place], [place]  [address]       Ph: [phone] or [phone]       Fax: [phone]   RxID:   [id number]

## 2010-08-02 NOTE — Progress Notes (Signed)
  Phone Note Outgoing Call   Summary of Call: call pt - cxr stable .  no rib abnormality noted by radiologist Initial call taken by: D. Thomos Lemons DO,  October 12, 2009 5:22 PM  Follow-up for Phone Call        Advised pt no rib abdnormalities  Follow-up by: Lannette Donath,  October 13, 2009 8:32 AM

## 2010-08-02 NOTE — Progress Notes (Signed)
Summary: Symptoms  Phone Note Outgoing Call   Summary of Call: call pt - is she having any symtpoms of urinary tract infection? Initial call taken by: D. Thomos Lemons DO,  October 10, 2009 1:25 PM  Follow-up for Phone Call        attempted to contact patient at [phone] line busy x 3  Follow-up by: Glendell Docker CMA,  October 10, 2009 3:06 PM  Additional Follow-up for Phone Call Additional follow up Details #1::        call placed to patient at [phone],patient husband states she was not available he expects her shortly and will have her call when she gets in Additional Follow-up by: Glendell Docker CMA,  October 10, 2009 4:14 PM    Additional Follow-up for Phone Call Additional follow up Details #2::    SW pt, she is not having symptoms of UTI but did mention she is on the last dosage of Clotimazole for thrush in her month. Said thrush seems better but she doesn't feel like she's completely over it Diane Tomerlin  October 10, 2009 4:38 PM  Additional Follow-up for Phone Call Additional follow up Details #3:: Details for Additional Follow-up Action Taken: I advise pt increase fluid intake and start cranberry tabs  Additional Follow-up by: D. Thomos Lemons DO,  October 11, 2009 12:27 PM  patient advised per Dr Artist Pais instructions Glendell Docker CMA  October 11, 2009 2:06 PM

## 2010-08-02 NOTE — Letter (Signed)
   [facility] [address] [place], [place]  [address]  [place] Phone: [phone]      March 30, 2010   [facility] [address] [place], [place] [address]  RE:  LAB RESULTS  Dear  Ms. Leinbach,  The following is an interpretation of your most recent lab tests.  Please take note of any instructions provided or changes to medications that have resulted from your lab work.  ELECTROLYTES:  Good - no changes needed  KIDNEY FUNCTION TESTS:  Good - no changes needed   THYROID STUDIES:  Thyroid studies normal TSH: 1.779     CBC:  Good - no changes needed       Sincerely Yours,    Dr. Thomos Lemons  Appended Document:  mailed

## 2010-08-02 NOTE — Consult Note (Signed)
Summary: [facility]  [facility]   Imported By: Lanelle Bal 07/17/2009 12:46:34  _____________________________________________________________________  External Attachment:    Type:   Image     Comment:   External Document

## 2010-08-02 NOTE — Assessment & Plan Note (Signed)
Summary: FOLLOW UP OF PFT AND ECHO / TF,CMA   Vital Signs:  Patient profile:   77 year old female Height:      61 inches Weight:      143 pounds BMI:     27.12 O2 Sat:      96 % on Room air Temp:     98.3 degrees F oral Pulse rate:   68 / minute Pulse rhythm:   irregular Resp:     16 per minute BP sitting:   106 / 76  (right arm) Cuff size:   regular  Vitals Entered By: Glendell Docker CMA (Nov 16, 2009 10:06 AM)  O2 Flow:  Room air CC: Rm 3- Follow up on test results     Last PAP Result Declined   Primary Care Provider:  [name] DO  CC:  Rm 3- Follow up on test results.  History of Present Illness: 77 year old white female for followup regarding left rib pain and shortness of breath Bone scan revealed uptake in inferior anterior left ribs.  This was felt to be secondary to left 11th rib fracture.  Patient still occasionally getting pain left chest area and sternum.  We reviewed results of pulmonary function tests and 2-D echocardiogram  Patient report previous concern for asthma.  She was tried on Advair in the past with no improvement. She has started walking program and is feeling better  IMPRESSION: Focal uptake within the inferior anterior left ribs.  When correlated with plain films, felt to be attributed to an nondisplaced anterior left eleventh rib fracture.  2-D echo-     - Normal LV size and systolic function, EF 55%. Mild focal basal       septal hypertrophy. Mild aortic regurgitation. Normal RV size and       systolic function, normal PA pressure.       grade 2 diastolic dysfunction       PFT's shows moderate obstructive lung disease.  especially small airway.  No signficant response to bronchodilator.  she can perform most ADL w/o issue.  history of right leg pain secondary to lumbar radiculopathy - gabapentin helps         Allergies (verified): No Known Drug Allergies  Past History:  Past Medical History: Chickenpox Allergic rhinitis    Skin cancer, hx of (nose)   Headache Diagnosed with mild asthma 2000 - tried advair after bronchospasm after exposure to cats  Past Surgical History: Hernia 2003     Family History: Unknown   Social History: Married Never Smoked Alcohol use-yes Drug use-no Regular exercise-yes     Physical Exam  General:  alert, well-developed, and well-nourished.   Lungs:  normal respiratory effort, normal breath sounds, no crackles, and no wheezes.   Heart:  normal rate, regular rhythm, no murmur, and no gallop.   Extremities:  No lower extremity edema  Neurologic:  cranial nerves II-XII intact and gait normal.   Psych:  normally interactive, good eye contact, not anxious appearing, and not depressed appearing.     Impression & Recommendations:  Problem # 1:  DYSPNEA ON EXERTION (ICD-786.09)  dyspnea on exertion is multifactorial. She has grade 2 diastolic dysfunction and moderate obstruction - PFTs. Unfortunately I do not think her blood pressure will tolerate ARB or calcium channel blocker. She has previous history of asthma. She did not respond to Advair in the past. we discussed regular walking program (mild interval training)  Her updated medication list for this problem includes:  Proair Hfa 108 (90 Base) Mcg/act Aers (Albuterol sulfate) .Marland Kitchen... 2 puffs 4 times a day as needed  Orders: Prescription Created Electronically [phone])  Problem # 2:  RIB PAIN, LEFT SIDED (ICD-786.50) bone  scan and rib x rays consistent with nondisplaced left 11th rib fracture.   rule out osteoporosis - schedule DEXA scan  Problem # 3:  SCIATICA, RIGHT (ICD-724.3) good response to gabapentin. Continue as needed  Complete Medication List: 1)  Multivitamins Tabs (Multiple vitamin) .... Once daily 2)  Calcium 1500 Mg Tabs (Calcium carbonate) .... Once daily 3)  Fexofenadine Hcl 180 Mg Tabs (Fexofenadine hcl) .Marland Kitchen.. 1 once daily as needed allergies 4)  Gabapentin 100 Mg Caps (Gabapentin) .... One by  mouth qhs 5)  Eye-vite Plus Lutein Caps (Multiple vitamins-minerals) .... Take 1 capsule by mouth two times a day 6)  Proair Hfa 108 (90 Base) Mcg/act Aers (Albuterol sulfate) .... 2 puffs 4 times a day as needed  Patient Instructions: 1)  Schedule screening DEXA scan 2)  Please schedule a follow-up appointment in 6 months. Prescriptions: PROAIR HFA 108 (90 BASE) MCG/ACT AERS (ALBUTEROL SULFATE) 2 puffs 4 times a day as needed  #1 x 3   Entered and Authorized by:   D. Thomos Lemons DO   Signed by:   D. Thomos Lemons DO on 11/17/2009   Method used:   Electronically to        [facility]       [address]       [place], [place]  [address]       Ph: [phone] or [phone]       Fax: [phone]   RxID:   [id number]   Current Allergies (reviewed today): No known allergies    Preventive Care Screening  Pap Smear:    Date:  11/16/2009    Results:  Declined

## 2010-08-02 NOTE — Progress Notes (Signed)
Summary: Don't want to take Coumadin  Phone Note Call from Patient Call back at Home Phone [phone]   Caller: Patient Reason for Call: Talk to Nurse Details for Reason: Don't want to take coumadin.  Summary of Call: Spoke with Ms. Bologna to set up New Coumadin appt.  Pt stated she is not going to take this med.  Wants to talk with her PCP.  Initial call taken by: Omar Person,  April 09, 2010 9:43 AM  Follow-up for Phone Call        has f/u appt w/ Dr. Artist Pais 10/27 Follow-up by: Claris Gladden RN,  April 09, 2010 9:50 AM  Additional Follow-up for Phone Call Additional follow up Details #1::        spoke w/pt and she expressed concerns about wanting a second opinion. explained to her that her rhythm has changed since last EKG in December and that there is greater risk of stroke w/A-Fib.  She expressed understanding. She also has been under a lot of pressure lately r/t spouse illness. she will f/u w/Dr. Artist Pais and adv [place] if she wants to start coumadin or another medicine.  Additional Follow-up by: Claris Gladden RN,  April 09, 2010 10:57 AM    Additional Follow-up for Phone Call Additional follow up Details #2::    Dr Ladona Ridgel aware Dennis Bast, RN, BSN  April 09, 2010 11:11 AM

## 2010-08-02 NOTE — Progress Notes (Signed)
Summary: Allegra Refill  Phone Note Call from Patient Call back at Home Phone [phone]   Caller: Patient Call For: D. Thomos Lemons DO Summary of Call: patient called and left voice message requesting a refill on Allegra. Initial call taken by: Glendell Docker CMA,  May 01, 2010 2:58 PM  Follow-up for Phone Call        call was placed to patient at [phone], no answer. A detailed voice message was left for patient informing her  rx was filled in September. She should call her pharmacy for refill Follow-up by: Glendell Docker CMA,  May 01, 2010 3:08 PM

## 2010-08-02 NOTE — Progress Notes (Signed)
Summary: EKG request  Phone Note Call from Patient Call back at Home Phone [phone]   Caller: Patient Summary of Call: Mailed copy of EKG from 06/28/09 & 03/29/10 per pt request to home address Initial call taken by: Lannette Donath,  April 05, 2010 1:36 PM

## 2010-08-02 NOTE — Assessment & Plan Note (Signed)
Summary: aniexity/dt   Vital Signs:  Patient profile:   77 year old female Height:      61 inches Weight:      143.50 pounds BMI:     27.21 O2 Sat:      97 % on Room air Temp:     97.6 degrees F oral Pulse rhythm:   irregular Resp:     16 per minute BP sitting:   110 / 80  (left arm) Cuff size:   regular  Vitals Entered By: Glendell Docker CMA (March 29, 2010 10:40 AM)  O2 Flow:  Room air Is Patient Diabetic? No Pain Assessment Patient in pain? no      Comments refill on Allegra, discuss anxiety- concerned with the thought that she could faint at anytime, mammogram-   Primary Care Provider:  [name] DO   History of Present Illness: 77 y/o white female for f/u pt c/o of anxiety " a lot going on" happily married overall her children doing if she is going somewhere, she tends to worry  her symptoms are chronic  still having intermittent shortness of breath  Preventive Screening-Counseling & Management  Alcohol-Tobacco     Smoking Status: never  EKG  Procedure date:  03/29/2010  Findings:      abnormal:  Atrial flutter with a ventricular response rate of:  87 RSR or QR pattern in V1 suggests right ventricular conduction delay. anteroseptal infarct, age undetermined.  Allergies (verified): No Known Drug Allergies  Past History:  Past Medical History: Chickenpox  Allergic rhinitis  Skin cancer, hx of (nose)   Headache Diagnosed with mild asthma 2000 - tried advair after bronchospasm after exposure to cats  Past Surgical History: Hernia 2003      Family History: Unknown     Social History: Married Never Smoked Alcohol use-yes Drug use-no Regular exercise-yes      Physical Exam  General:  alert, well-developed, and well-nourished.   Lungs:  normal respiratory effort, normal breath sounds, no crackles, and no wheezes.   Heart:  normal rate, no gallop, and irregular rhythm.   Psych:  slightly anxious.     Impression &  Recommendations:  Problem # 1:  ATRIAL FLUTTER (ICD-427.32) 77 y/o presents with anxiety symptoms.  afib or flutter may be contributing.  start cardizem for rate control.  refer to Dr. Ladona Ridgel for further eval and tx Her updated medication list for this problem includes:    Warfarin Sodium 3 Mg Tabs (Warfarin sodium) ..... Once daily    Cardizem Cd 120 Mg Xr24h-cap (Diltiazem hcl coated beads) ..... Once daily  Orders: T-Basic Metabolic Panel [phone]) T-Magnesium [phone]) T-TSH [phone]) T-T4, Free [phone]) T-CBC No Diff ([id number]) Cardiology Referral (Cardiology)  Problem # 2:  ANXIETY (ICD-300.00) If persistent symptoms after tx for a fib , we discused starting SSRI  Complete Medication List: 1)  Multivitamins Tabs (Multiple vitamin) .... Once daily 2)  Fexofenadine Hcl 180 Mg Tabs (Fexofenadine hcl) .... Take 1 tablet by mouth once a day as needed 3)  Eye-vite Plus Lutein Caps (Multiple vitamins-minerals) .... Take 1 capsule by mouth two times a day 4)  Proair Hfa 108 (90 Base) Mcg/act Aers (Albuterol sulfate) .... 2 puffs 4 times a day as needed 5)  Warfarin Sodium 3 Mg Tabs (Warfarin sodium) .... Once daily 6)  Cardizem Cd 120 Mg Xr24h-cap (Diltiazem hcl coated beads) .... Once daily  Other Orders: EKG w/ Interpretation (93000) Influenza Vaccine MCR (56387) Flu Vaccine 80yrs + MEDICARE PATIENTS (  [id number])  Patient Instructions: 1)  Please schedule a follow-up appointment in 1 month. Prescriptions: FEXOFENADINE HCL 180 MG TABS (FEXOFENADINE HCL) Take 1 tablet by mouth once a day as needed  #90 x 3   Entered and Authorized by:   D. Thomos Lemons DO   Signed by:   D. Thomos Lemons DO on 03/29/2010   Method used:   Electronically to        [facility]       [address]       [place], [place]  [address]       Ph: [phone] or [phone]       Fax: [phone]   RxID:   [id number] DILTIAZEM HCL 30 MG TABS (DILTIAZEM HCL) one by mouth two  times a day  #60 x 1   Entered and Authorized by:   D. Thomos Lemons DO   Signed by:   D. Thomos Lemons DO on 03/29/2010   Method used:   Electronically to        [facility]       [address]       [place], [place]  [address]       Ph: [phone] or [phone]       Fax: [phone]   RxID:   [id number]   Current Allergies (reviewed today): No known allergies      Immunizations Administered:  Influenza Vaccine # 1:    Vaccine Type: Fluvax MCR    Site: left deltoid    Mfr: [employer]oSmithKline    Dose: 0.5 ml    Route: IM    Given by: Glendell Docker CMA    Exp. Date: 12/29/2010    Lot #: [id number]    VIS given: 01/23/10 version given March 29, 2010.  Flu Vaccine Consent Questions:    Do you have a history of severe allergic reactions to this vaccine? no    Any prior history of allergic reactions to egg and/or gelatin? no    Do you have a sensitivity to the preservative Thimersol? no    Do you have a past history of Guillan-Barre Syndrome? no    Do you currently have an acute febrile illness? no    Have you ever had a severe reaction to latex? no    Vaccine information given and explained to patient? yes    Are you currently pregnant? no     Vital Signs:  Patient Profile:   77 year old female Height:     61 inches Weight:      143.50 pounds BMI:     27.21 O2 Sat:      97 % Temp:     97.6 degrees F oral Pulse rhythm:   irregular Resp:     16 per minute BP sitting:   110 / 80 Cuff size:   regular

## 2010-08-02 NOTE — Progress Notes (Signed)
Summary: EKG COPY  Phone Note Call from Patient Call back at Home Phone [phone]   Caller: Patient Summary of Call: patient called  and left voice message requesting a copy of her EKG report mailed to her at her home address. Copy of report printed and mailed to patient to address on file Initial call taken by: Glendell Docker CMA,  May 23, 2010 2:03 PM

## 2010-08-02 NOTE — Progress Notes (Signed)
Summary: PFT/Echo results  Phone Note Call from Patient Call back at [phone]   Caller: Patient Call For: D. Thomos Lemons DO Summary of Call: Patient called wanting to know the results of her PFT and Echo. Please advise?  Mervin Kung CMA  Nov 06, 2009 4:19 PM   Follow-up for Phone Call        PFT's shows moderate obstructive lung disease.  especially small airway.  No signficant response to bronchodilator. 2 D echo - showed normal systolic function,  ejection fraction is normal.  It did show diastolic dysfunction  I suggest f/u appointment within one week where I can explain findings in more detail Follow-up by: D. Thomos Lemons DO,  Nov 06, 2009 4:42 PM  Additional Follow-up for Phone Call Additional follow up Details #1::        Left message with pt's husband for pt to call me back.  Mervin Kung CMA  Nov 07, 2009 3:45 PM   Spoke to pt and advised her we need to schedule f/u to discuss results in detail.  Appt. scheduled for 11/16/09 @ 10am.  Mervin Kung CMA  Nov 08, 2009 9:07 AM

## 2010-08-13 ENCOUNTER — Encounter: Payer: Self-pay | Admitting: Internal Medicine

## 2010-08-13 ENCOUNTER — Ambulatory Visit (INDEPENDENT_AMBULATORY_CARE_PROVIDER_SITE_OTHER): Payer: Medicare Other | Admitting: Internal Medicine

## 2010-08-13 DIAGNOSIS — M25519 Pain in unspecified shoulder: Secondary | ICD-10-CM

## 2010-08-13 DIAGNOSIS — I4891 Unspecified atrial fibrillation: Secondary | ICD-10-CM

## 2010-08-22 ENCOUNTER — Other Ambulatory Visit: Payer: Self-pay | Admitting: Internal Medicine

## 2010-08-22 ENCOUNTER — Ambulatory Visit (INDEPENDENT_AMBULATORY_CARE_PROVIDER_SITE_OTHER): Payer: Medicare Other | Admitting: Family

## 2010-08-22 ENCOUNTER — Encounter: Payer: Self-pay | Admitting: Family

## 2010-08-22 ENCOUNTER — Telehealth: Payer: Self-pay | Admitting: Family

## 2010-08-22 ENCOUNTER — Ambulatory Visit (HOSPITAL_BASED_OUTPATIENT_CLINIC_OR_DEPARTMENT_OTHER)
Admission: RE | Admit: 2010-08-22 | Discharge: 2010-08-22 | Disposition: A | Discharge status: Home / Self Care | Payer: Medicare Other | Source: Ambulatory Visit | Attending: Internal Medicine | Admitting: Internal Medicine

## 2010-08-22 DIAGNOSIS — R059 Cough, unspecified: Secondary | ICD-10-CM | POA: Insufficient documentation

## 2010-08-22 DIAGNOSIS — R0602 Shortness of breath: Secondary | ICD-10-CM | POA: Insufficient documentation

## 2010-08-22 DIAGNOSIS — R05 Cough: Secondary | ICD-10-CM

## 2010-08-22 DIAGNOSIS — I509 Heart failure, unspecified: Secondary | ICD-10-CM

## 2010-08-22 DIAGNOSIS — I517 Cardiomegaly: Secondary | ICD-10-CM | POA: Insufficient documentation

## 2010-08-22 DIAGNOSIS — J90 Pleural effusion, not elsewhere classified: Secondary | ICD-10-CM | POA: Insufficient documentation

## 2010-08-27 ENCOUNTER — Telehealth: Payer: Self-pay | Admitting: Family

## 2010-08-28 NOTE — Assessment & Plan Note (Signed)
Summary: chest congestion and coughing/ss--rm 5   Vital Signs:  Patient profile:   77 year old female Height:      61 inches Weight:      147 pounds BMI:     27.88 O2 Sat:      94 % on Room air Temp:     98.7 degrees F oral Pulse rate:   66 / minute Pulse rhythm:   regular Resp:     18 per minute BP sitting:   116 / 80  (right arm) Cuff size:   regular  Vitals Entered By: Mervin Kung CMA (AAMA) (August 22, 2010 1:20 PM)  O2 Flow:  Room air CC: Pt states she has had a dry cough, shortness of breath and runny nose x 1 week Is Patient Diabetic? No Pain Assessment Patient in pain? no      Comments Has tried fexofenadine with minimal relief.  Nicki Guadalajara Fergerson CMA Duncan Dull)  August 22, 2010 1:26 PM    Primary Care Provider:  [name] DO  CC:  Pt states she has had a dry cough and shortness of breath and runny nose x 1 week.  History of Present Illness: Ms.  Sandell is an 77 year old female who presents today with complaint of dry cough. Symptoms started about 1 week ago.   Cough is associated with shortness of breath, DOE and clear nasal discharge. Cough is not associated with wheezing or fever. Patient describes her SOB as moderate to severe in nature. Symptoms are improved by rest.  Allergies (verified): No Known Drug Allergies  Past History:  Past Medical History: Last updated: 07/12/2010 Chickenpox  Allergic rhinitis  Skin cancer, hx of (nose)    Headache   Diagnosed with mild asthma 2000 - tried advair after bronchospasm after exposure to cats  Past Surgical History: Last updated: 07/12/2010 Hernia 2003    Decompression, left median nerve  Right shoulder arthroscopic acromioplasty   Review of Systems       several episodes of heart "pounding" in the lat 1-2 days- brief.  +pleuritic chest pain worse with inspiration  Physical Exam  General:  Well-developed,well-nourished,in no acute distress; alert,appropriate and cooperative throughout  examination Head:  Normocephalic and atraumatic without obvious abnormalities. No apparent alopecia or balding. Lungs:  diminished breath sounds at bases.  No wheezing, no crackles.  Mild SOB noted after ambulation, but no increased work of breathing.  Heart:  grade 2 systolic murmur, U9/W1, RRR Extremities:  No peripheral edema is noted.     Impression & Recommendations:  Problem # 1:  CHF, MILD (ICD-428.0) Assessment New CXR today notes + pulmonary vascular congestion. O2 sat stable on RA- 94%.  EKG performed today in the office notes NSR- rate controlled with beta blocker. Review of 2-d echo performed last June notes aortic sclerosis, grade 2 diastolic dysfunction and normal LVEF.   Will plan to repeat 2-D echo, initiate furosemide (40 mg today followed by 20mg  daily). Pt to f/u in 1 week with Dr. Artist Pais.  (Will need BMET that day to check potassium.)Will also arrange a follow up consultation with [facility].  Pt with known hx of PAF/flutter who has reportedly declined anticoagulation or aspirin therapy.  Pt instructed to monitor weight as outlined in pt instructions. Her updated medication list for this problem includes:    Bisoprolol Fumarate 5 Mg Tabs (Bisoprolol fumarate) .Marland Kitchen... 1/2 tab by mouth once daily    Furosemide 40 Mg Tabs (Furosemide) ..... One tablet by mouth daily  Orders: Misc. Referral (Misc. Ref) Cardiology Referral (Cardiology) Prescription Created Electronically [phone])  Complete Medication List: 1)  Bisoprolol Fumarate 5 Mg Tabs (Bisoprolol fumarate) .... 1/2 tab by mouth once daily 2)  Multivitamins Tabs (Multiple vitamin) .... Take 1 tablet by mouth once a day 3)  Furosemide 40 Mg Tabs (Furosemide) .... One tablet by mouth daily  Other Orders: CXR- 2view (CXR)  Patient Instructions: 1)  Take 40mg  of furosemide today, followed by 1/2 tablet (20mg ) once daily.  2)  You will be contacted about your echocardiogram. 3)  You will be contacted about your  referral with Dr. Jens Som. 4)  Call if you develop worsening shortness of breath- go to ER if severe. 5)  Weigh yourself daily and call if you gain greater than 2-3 pounds. Prescriptions: FUROSEMIDE 40 MG TABS (FUROSEMIDE) one tablet by mouth daily  #30 x 0   Entered and Authorized by:   Lemont Fillers FNP   Signed by:   Lemont Fillers FNP on 08/22/2010   Method used:   Electronically to        [id number] (retail)       [address]       [place], [place]  [address]       Ph: [phone] or [phone]       Fax: [phone]   RxID:   [id number]    Orders Added: 1)  CXR- 2view [CXR] 2)  Misc. Referral [Misc. Ref] 3)  Cardiology Referral [Cardiology] 4)  Est. Patient Level IV [66440] 5)  Prescription Created Electronically [id number]    Current Allergies (reviewed today): No known allergies

## 2010-08-28 NOTE — Assessment & Plan Note (Signed)
Summary: 1 mth follow up/ss   Vital Signs:  Patient profile:   77 year old female Height:      61 inches Weight:      147.75 pounds O2 Sat:      98 % on Room air Temp:     97.3 degrees F oral Pulse rate:   73 / minute Resp:     18 per minute BP sitting:   100 / 70  (right arm) Cuff size:   regular  Vitals Entered By: Glendell Docker CMA (August 13, 2010 10:02 AM)  O2 Flow:  Room air CC: 1 Month Follow up , atrial fibrillation follow-up Is Patient Diabetic? No Pain Assessment Patient in pain? no      Comments blood pressue medication is working well, still has some irritation   Primary Care Provider:  D. Thomos Lemons DO  CC:  1 Month Follow up  and atrial fibrillation follow-up.  History of Present Illness:       This is an 77 year old female who presents for atrial fibrillation follow-up.  The patient has no history of shortness of breath, chest pain with exertion, and syncope.  TThe patient reports not on coumadin-patient refused.  Review of offending agents reveals no caffeine use.  she is tolerating bisoprolol well  shoulder pain - she is not sure whether she should consider shoulder surgery.  mild improvement with PT  Preventive Screening-Counseling & Management  Alcohol-Tobacco     Smoking Status: never  Allergies (verified): No Known Drug Allergies  Past History:  Past Medical History: Chickenpox  Allergic rhinitis  Skin cancer, hx of (nose)    Headache   Diagnosed with mild asthma 2000 - tried advair after bronchospasm after exposure to cats Left shoulder pain with rotator cuff tear  05-2010 (Dr. [name] Has)   MRI of left shoulder  Atrial fibrillation - pt does not want anticoagulation  Physical Exam  General:  alert, well-developed, and well-nourished.   Lungs:  normal respiratory effort and normal breath sounds.   Heart:  normal rate, no gallop, and irregular rhythm.   Extremities:  No lower extremity edema   Impression &  Recommendations:  Problem # 1:  ATRIAL FIBRILLATION (ICD-427.31) rate controlled. she does not want to take coumadin she did not tolerate aspirin due to GI side effects  Her updated medication list for this problem includes:    Bisoprolol Fumarate 5 Mg Tabs (Bisoprolol fumarate) .Marland Kitchen... 1/2 tab by mouth once daily  Problem # 2:  SHOULDER PAIN, LEFT (ICD-719.41) patient is followed by Dr. [name] Has patient process of completing physical therapy Patient unsure whether she wants to proceed with left shoulder surgery Patient considering second opinion  She had MRI of left shoulder on June 08, 2010 It showed moderate glenohumeral osteoarthritis and severe a.c. joint osteoarthritis with acquired type III acromion Full-thickness critical zones to their native tendon tear measuring 4 x 4 mm with severe supraspinatus tendinopathy and fraying Essentially complete subscapularis tear with 2.5 cm of retraction. Severe fatty atrophy Long head biceps tendon tear Glenohumeral effusion and synovitis  Complete Medication List: 1)  Bisoprolol Fumarate 5 Mg Tabs (Bisoprolol fumarate) .... 1/2 tab by mouth once daily 2)  Multivitamins Tabs (Multiple vitamin) .... Take 1 tablet by mouth once a day 3)  Furosemide 40 Mg Tabs (Furosemide) .... One tablet by mouth daily  Patient Instructions: 1)  Please schedule a follow-up appointment in 6 months - (30 minutes) Prescriptions: BISOPROLOL FUMARATE 5 MG TABS (BISOPROLOL FUMARATE) 1/2  tab by mouth once daily  #30 x 5   Entered and Authorized by:   D. Thomos Lemons DO   Signed by:   D. Thomos Lemons DO on 08/13/2010   Method used:   Electronically to        [id number] ([facility]       [address]       [place], [place]  [address]       Ph: [phone] or [phone]       Fax: [phone]   RxID:   [id number]    Orders Added: 1)  Est. Patient Level III [59563]       Current Allergies (reviewed today): No known allergies

## 2010-08-30 ENCOUNTER — Ambulatory Visit: Payer: Medicare Other | Admitting: Internal Medicine

## 2010-08-31 ENCOUNTER — Ambulatory Visit (INDEPENDENT_AMBULATORY_CARE_PROVIDER_SITE_OTHER): Payer: Medicare Other | Admitting: Internal Medicine

## 2010-08-31 ENCOUNTER — Encounter: Payer: Self-pay | Admitting: Internal Medicine

## 2010-08-31 DIAGNOSIS — R079 Chest pain, unspecified: Secondary | ICD-10-CM

## 2010-08-31 DIAGNOSIS — I4891 Unspecified atrial fibrillation: Secondary | ICD-10-CM

## 2010-08-31 DIAGNOSIS — I509 Heart failure, unspecified: Secondary | ICD-10-CM

## 2010-08-31 LAB — CONVERTED CEMR LAB
Creatinine, Ser: 1.1 mg/dL (ref 0.40–1.20)
Potassium: 4.6 meq/L (ref 3.5–5.3)
Sodium: 139 meq/L (ref 135–145)

## 2010-09-03 ENCOUNTER — Telehealth: Payer: Self-pay | Admitting: Internal Medicine

## 2010-09-04 ENCOUNTER — Other Ambulatory Visit (HOSPITAL_COMMUNITY): Payer: Medicare Other

## 2010-09-04 ENCOUNTER — Ambulatory Visit (HOSPITAL_COMMUNITY): Payer: Medicare Other | Attending: Internal Medicine

## 2010-09-04 ENCOUNTER — Telehealth: Payer: Self-pay | Admitting: Internal Medicine

## 2010-09-04 DIAGNOSIS — I359 Nonrheumatic aortic valve disorder, unspecified: Secondary | ICD-10-CM | POA: Insufficient documentation

## 2010-09-04 DIAGNOSIS — R0609 Other forms of dyspnea: Secondary | ICD-10-CM | POA: Insufficient documentation

## 2010-09-04 DIAGNOSIS — I509 Heart failure, unspecified: Secondary | ICD-10-CM

## 2010-09-04 DIAGNOSIS — R0989 Other specified symptoms and signs involving the circulatory and respiratory systems: Secondary | ICD-10-CM | POA: Insufficient documentation

## 2010-09-06 NOTE — Progress Notes (Signed)
Summary: ? questions  Phone Note Call from Patient Call back at Home Phone [phone]   Caller: Patient Call For: D. Thomos Lemons DO Summary of Call: pt called wanting to speak to [facility] about how long she should be on diaretic. Also, she would like to know if she is contagious? please assist. Initial call taken by: Elba Barman,  August 27, 2010 12:03 PM  Follow-up for Phone Call        Spoke to pt and scheduled 1 week f/u with Dr Artist Pais for 08/30/10 @ 2:15pm.  Advised pt her CXR did not show bronchitis and she does not appear to be contagious unless she develops a fever. Advised pt I would call her tomorrow re: need for diuretic. Please advise. Nicki Guadalajara Fergerson CMA Duncan Dull)  August 27, 2010 2:24 PM   Additional Follow-up for Phone Call Additional follow up Details #1::        Patient should continue lasix 20mg  (half tablet) once daily until she sees Dr. Artist Pais. Additional Follow-up by: Lemont Fillers FNP,  August 27, 2010 8:11 PM    Additional Follow-up for Phone Call Additional follow up Details #2::    Left message with pt's husband to have pt return my call. Nicki Guadalajara Fergerson CMA Duncan Dull)  August 28, 2010 9:28 AM   pt returned phone call. [phone]. please assist.  Elba Barman,  August 28, 2010 2:46 PM  Returned pt call and received busy signal. Will try again later. Nicki Guadalajara Fergerson CMA Duncan Dull)  August 28, 2010 3:17 PM   Pt notified. Nicki Guadalajara Fergerson CMA Duncan Dull)  August 28, 2010 3:46 PM

## 2010-09-11 NOTE — Progress Notes (Signed)
Summary: Lab results  Phone Note Outgoing Call   Summary of Call: please call patient-electrolytes, kidney function, congestive heart failure blood tests all normal Initial call taken by: D. Thomos Lemons DO,  September 03, 2010 12:56 PM  Follow-up for Phone Call        call placed to patient at [phone], line busy x3  Glendell Docker [facility]  September 03, 2010 5:38 PM   Additional Follow-up for Phone Call Additional follow up Details #1::        call placed to patient at [phone] , she was informed per Dr Artist Pais instructions Additional Follow-up by: Glendell Docker CMA,  September 04, 2010 8:51 AM

## 2010-09-11 NOTE — Progress Notes (Signed)
Summary: Diuretic  Phone Note Call from Patient Call back at Home Phone [phone]   Caller: Patient Call For: D. Thomos Lemons DO Summary of Call: patient called and left voice message wanting to know if she needed to continue with the Diuretic. She states she is only taking 1/2 right now. Initial call taken by: Glendell Docker CMA,  September 04, 2010 11:07 AM  Follow-up for Phone Call        yes continue taking diurectic.   plz clarify whether pt taking 1/2 of 40 mg or 1/2 of 20 mg of lasix Follow-up by: D. Thomos Lemons DO,  September 04, 2010 2:25 PM  Additional Follow-up for Phone Call Additional follow up Details #1::        call placed to patient at [phone], no answer. A detailed voice message was left for patient to return call regarding diuretic Additional Follow-up by: Glendell Docker CMA,  September 04, 2010 3:27 PM    Additional Follow-up for Phone Call Additional follow up Details #2::    call returned by patient, she has verified that she is taking 20mg  of Lasix. She wanted to make Dr Artist Pais aware that she has her echo done today Follow-up by: Glendell Docker CMA,  September 04, 2010 5:16 PM  Additional Follow-up for Phone Call Additional follow up Details #3:: Details for Additional Follow-up Action Taken: call pt - 2D echo results will be reviewed by cardiologist at her ov with Dr. Jens Som Additional Follow-up by: D. Thomos Lemons DO,  September 06, 2010 4:47 PM  call placed to patient at [phone], she was informed per Dr Artist Pais instructions Glendell Docker CMA  September 06, 2010 4:57 PM

## 2010-09-11 NOTE — Progress Notes (Signed)
  Phone Note Outgoing Call   Summary of Call: Please call patient and arrange a follow up appointment with Dr. Artist Pais in 1 week. Initial call taken by: Lemont Fillers FNP,  August 22, 2010 3:51 PM  Follow-up for Phone Call        have called each day and have been unable to reach patient Caroline Myers  September 07, 2010 8:22 AM

## 2010-09-12 ENCOUNTER — Encounter: Payer: Self-pay | Admitting: Cardiology

## 2010-09-12 ENCOUNTER — Institutional Professional Consult (permissible substitution) (INDEPENDENT_AMBULATORY_CARE_PROVIDER_SITE_OTHER): Payer: Medicare Other | Admitting: Cardiology

## 2010-09-12 ENCOUNTER — Telehealth: Payer: Self-pay | Admitting: Internal Medicine

## 2010-09-12 DIAGNOSIS — I4891 Unspecified atrial fibrillation: Secondary | ICD-10-CM

## 2010-09-12 DIAGNOSIS — I359 Nonrheumatic aortic valve disorder, unspecified: Secondary | ICD-10-CM

## 2010-09-18 NOTE — Progress Notes (Signed)
Summary: Medication question  Phone Note Call from Patient Call back at Home Phone [phone]   Caller: Patient Call For: D. Thomos Lemons DO Summary of Call: patient called and left voice message stating she was seen by Dr Jens Som today. She states she asked him about continuting the Furosemide  and was advised to check with Dr Artist Pais. Patient states that she has had a dry mouth and unusual tingling in her body, and she would like to stop taking the medication if it is ok with Dr Artist Pais Initial call taken by: Glendell Docker CMA,  September 12, 2010 11:24 AM  Follow-up for Phone Call        I suggest she take 1/2 dose of furosemide or 10 mg Follow-up by: D. Thomos Lemons DO,  September 12, 2010 12:00 PM  Additional Follow-up for Phone Call Additional follow up Details #1::        call placed to patient at [phone], she was informed per Dr Artist Pais instructions. Additional Follow-up by: Glendell Docker CMA,  September 12, 2010 12:47 PM    New/Updated Medications: FUROSEMIDE 20 MG TABS (FUROSEMIDE) one half tab by mouth once daily

## 2010-09-18 NOTE — Assessment & Plan Note (Signed)
Summary: re-est pt seen Caroline Myers in past. dx: mild chf. per Caroline Myers office...   Visit Type:  Initial Consult Primary Provider:  [name] DO   History of Present Illness: 77 year old female previously seen by Dr. Ladona Myers for followup of atrial fibrillation and mild congestive heart failure. She apparently had a cardiac catheterization in 2008 but I do not have those records available. Apparently no significant coronary disease noted. Last echocardiogram was performed in March 2012 and revealed normal LV function with mild left ventricular hypertrophy. There was mild aortic stenosis with mild to moderate aortic insufficiency. The left atrium was mildly dilated. Patient does have dyspnea on exertion which has been present for 2-3 years. It is relieved with rest. There is no orthopnea, PND, pedal edema, palpitations, syncope. She also notices a chest heaviness with exertion relieved with rest. It is not pleuritic. It can also occur with lying on her left side.  Current Medications (verified): 1)  Bisoprolol Fumarate 5 Mg Tabs (Bisoprolol Fumarate) .... 1/2 Tab By Mouth Once Daily 2)  Furosemide 20 Mg Tabs (Furosemide) .... One By Mouth Once Daily  Allergies (verified): No Known Drug Allergies  Past History:  Past Medical History: Chickenpox  Allergic rhinitis  Skin cancer, hx of (nose)     Diagnosed with mild asthma 2000 - tried advair after bronchospasm after exposure to cats Left shoulder pain with rotator cuff tear  05-2010 (Dr. [name] Has) Atrial fibrillation - pt does not want anticoagulation Aortic stenosis/aortic insufficiency  Past Surgical History: Reviewed history from 07/12/2010 and no changes required. Hernia 2003    Decompression, left median nerve  Right shoulder arthroscopic acromioplasty   Social History: Reviewed history from 07/12/2010 and no changes required. Married Never Smoked Alcohol use-yes  Drug use-no Regular exercise-yes        Review of Systems   no fevers or chills, productive cough, hemoptysis, dysphasia, odynophagia, melena, hematochezia, dysuria, hematuria, rash, seizure activity, orthopnea, PND, pedal edema, claudication. Remaining systems are negative.   Vital Signs:  Patient profile:   77 year old female Height:      61 inches Weight:      143 pounds BMI:     27.12 Pulse rate:   68 / minute Pulse rhythm:   regular Resp:     18 per minute BP sitting:   120 / 70  (left arm) Cuff size:   large  Vitals Entered By: Vikki Ports (September 12, 2010 9:20 AM)  Physical Exam  General:  Well developed/well nourished in NAD Skin warm/dry Patient not depressed No peripheral clubbing Back-normal HEENT-normal/normal eyelids Neck supple/normal carotid upstroke bilaterally; no bruits; no JVD; no thyromegaly chest - CTA/ normal expansion CV - RRR/normal S1 and S2; no murmurs, rubs or gallops;  PMI nondisplaced Abdomen -NT/ND, no HSM, no mass, + bowel sounds, no bruit 2+ femoral pulses, no bruits Ext-no edema, chords, 2+ DP Neuro-grossly nonfocal     EKG  Procedure date:  08/22/2010  Findings:      Sinus with first degree AV block and incomplete right bundle branch block.  Impression & Recommendations:  Problem # 1:  ATRIAL FIBRILLATION (ICD-427.31) Pt has documented paroxysmal atrial fibrillation. I do not think she is particularly symptomatic. We had long discussions in the office today (> 30 minutes spent with patient) concerning this arrhythmia. We will continue with bisoprolol for rate control. We also discussed the risk of an embolic event including CVA. Her embolic risk factors include age greater than 76 and female sex. I  therefore recommended Coumadin. We also discussed Pradaxa. She declined both of these therapies and understands the risk of an embolic event. I recommended enteric-coated aspirin 325 mg p.o. daily. She was hesitant as her husband has a history of ulcer disease and she was concerned with her risk.  However she is agreeable to aspirin at this point. Note previous TSH normal. The following medications were removed from the medication list:    Aspirin Ec 325 Mg Tbec (Aspirin) ..... One by mouth once daily Her updated medication list for this problem includes:    Bisoprolol Fumarate 5 Mg Tabs (Bisoprolol fumarate) .Marland Kitchen... 1/2 tab by mouth once daily    Aspirin Ec 325 Mg Tbec (Aspirin) .Marland Kitchen... Take one tablet by mouth daily  Problem # 2:  CHEST PAIN (ICD-786.50) We will obtain previous catheterization results from 2008. I recommended a Myoview but the patient declined. The following medications were removed from the medication list:    Aspirin Ec 325 Mg Tbec (Aspirin) ..... One by mouth once daily Her updated medication list for this problem includes:    Bisoprolol Fumarate 5 Mg Tabs (Bisoprolol fumarate) .Marland Kitchen... 1/2 tab by mouth once daily    Aspirin Ec 325 Mg Tbec (Aspirin) .Marland Kitchen... Take one tablet by mouth daily  Problem # 3:  CHF, MILD (ICD-428.0) Patient will continue on low-dose diuretic. She does not wish to continue this long-term. If her dyspnea does not improve then this can be discontinued. I've asked her to followup with her primary care concerning this. Note a previous BNP was only mildly elevated. The following medications were removed from the medication list:    Aspirin Ec 325 Mg Tbec (Aspirin) ..... One by mouth once daily Her updated medication list for this problem includes:    Bisoprolol Fumarate 5 Mg Tabs (Bisoprolol fumarate) .Marland Kitchen... 1/2 tab by mouth once daily    Furosemide 20 Mg Tabs (Furosemide) ..... One by mouth once daily    Aspirin Ec 325 Mg Tbec (Aspirin) .Marland Kitchen... Take one tablet by mouth daily  Problem # 4:  ANXIETY (ICD-300.00) Patient very anxious in the office today.  Patient Instructions: 1)  Your physician has recommended you make the following change in your medication: START ASPIRIN 325MG  ONCE DAILY 2)  Your physician wants you to follow-up in:6 MONTHS   You will  receive a reminder letter in the mail two months in advance. If you don't receive a letter, please call our office to schedule the follow-up appointment. Prescriptions: BISOPROLOL FUMARATE 5 MG TABS (BISOPROLOL FUMARATE) 1/2 tab by mouth once daily  #90 x 4   Entered by:   Deliah Goody, RN   Authorized by:   Ferman Hamming, MD, [facility]   Signed by:   Deliah Goody, RN on 09/12/2010   Method used:   Faxed to ...       [facility] (mail-order)             , [place]         Ph: [phone]       Fax: [phone]   RxID:   [id number]

## 2010-09-18 NOTE — Assessment & Plan Note (Signed)
Summary: 1 week follow up.   Vital Signs:  Patient profile:   77 year old female Height:      61 inches Weight:      144.75 pounds BMI:     27.45 O2 Sat:      96 % on Room air Temp:     98.0 degrees F oral Pulse rate:   68 / minute Resp:     18 per minute BP sitting:   100 / 70  (right arm) Cuff size:   regular  Vitals Entered By: Glendell Docker CMA (August 31, 2010 10:15 AM)  O2 Flow:  Room air CC: 1 Week Follow up Is Patient Diabetic? No Comments no concerns   Primary Care Provider:  [name] DO  CC:  1 Week Follow up.  History of Present Illness:   77 year old white female with paroxysmal atrial fibrillation for followup  Patient previously seen by nurse practitioner for dry cough and dyspnea on exertion Chest x-ray was suggestive of volume overload - patient started on Lasix 40 mg   dyspnea has improved   she mentions she has had on and off chest pain   patient experienced similar symptoms in the past and was worked up by cardiologist  she reports negative workup.  Preventive Screening-Counseling & Management  Alcohol-Tobacco     Smoking Status: never  Allergies (verified): No Known Drug Allergies  Past History:  Past Medical History: Chickenpox   Allergic rhinitis  Skin cancer, hx of (nose)    Headache   Diagnosed with mild asthma 2000 - tried advair after bronchospasm after exposure to cats Left shoulder pain with rotator cuff tear  05-2010 (Dr. [name] Has)   MRI of left shoulder  Atrial fibrillation - pt does not want anticoagulation  Physical Exam  General:  alert, well-developed, and well-nourished.   Lungs:  normal respiratory effort, normal breath sounds, no crackles, and no wheezes.   Heart:  normal rate, regular rhythm, and no gallop.     Impression & Recommendations:  Problem # 1:  CHF, MILD (ICD-428.0) Assessment Improved  Her updated medication list for this problem includes:    Bisoprolol Fumarate 5 Mg Tabs (Bisoprolol fumarate)  .Marland Kitchen... 1/2 tab by mouth once daily    Furosemide 20 Mg Tabs (Furosemide) ..... One half tab by mouth once daily    Aspirin Ec 325 Mg Tbec (Aspirin) .Marland Kitchen... Take one tablet by mouth daily  Orders: T-Basic Metabolic Panel [phone]) T-BNP  (B Natriuretic Peptide) [phone])  Problem # 2:  ATRIAL FIBRILLATION (ICD-427.31) rate controlled. declines anticoagulation pt afraid of asprin therapy due to risk of bleeding she has not taken on regular basis pt agrees to try aspirin  Her updated medication list for this problem includes:    Bisoprolol Fumarate 5 Mg Tabs (Bisoprolol fumarate) .Marland Kitchen... 1/2 tab by mouth once daily    Aspirin Ec 325 Mg Tbec (Aspirin) .Marland Kitchen... Take one tablet by mouth daily  Problem # 3:  CHEST PAIN (ICD-786.50) agree with cardiology eval  Complete Medication List: 1)  Bisoprolol Fumarate 5 Mg Tabs (Bisoprolol fumarate) .... 1/2 tab by mouth once daily 2)  Furosemide 20 Mg Tabs (Furosemide) .... One half tab by mouth once daily 3)  Aspirin Ec 325 Mg Tbec (Aspirin) .... Take one tablet by mouth daily  Patient Instructions: 1)  Please schedule a follow-up appointment in 2 months. Prescriptions: FUROSEMIDE 20 MG TABS (FUROSEMIDE) one by mouth once daily  #90 x 1   Entered and Authorized  by:   [name] DO   Signed by:   D. Thomos Lemons DO on 08/31/2010   Method used:   Electronically to        [id number] (retail)       [address]       [place], [place]  [address]       Ph: [phone] or [phone]       Fax: [phone]   RxID:   [id number]    Orders Added: 1)  T-Basic Metabolic Panel [phone] 2)  T-BNP  (B Natriuretic Peptide) [[phone]] 3)  Est. Patient Level III [34742]    Current Allergies (reviewed today): No known allergies

## 2010-09-24 ENCOUNTER — Telehealth: Payer: Self-pay | Admitting: *Deleted

## 2010-09-24 NOTE — Telephone Encounter (Signed)
Patient called and she is requesting clarification on her Furosemide. She would like to know if she is to take 10mg  or 20mg 

## 2010-09-25 NOTE — Telephone Encounter (Signed)
Take 10 mg

## 2010-09-25 NOTE — Telephone Encounter (Signed)
Call returned to patient at [phone], she was informed per Dr Artist Pais instructions,  She is to take 10mg . Patient has verbalized understanding

## 2010-10-01 ENCOUNTER — Encounter: Payer: Self-pay | Admitting: Internal Medicine

## 2010-10-12 ENCOUNTER — Encounter: Payer: Self-pay | Admitting: Family

## 2010-10-12 ENCOUNTER — Encounter: Payer: Self-pay | Admitting: Internal Medicine

## 2010-10-12 ENCOUNTER — Ambulatory Visit (INDEPENDENT_AMBULATORY_CARE_PROVIDER_SITE_OTHER): Payer: Medicare Other | Admitting: Family

## 2010-10-12 VITALS — BP 124/74 | HR 60 | Temp 98.1°F | Resp 20 | Ht 60.98 in | Wt 143.0 lb

## 2010-10-12 DIAGNOSIS — Z0000 Encounter for general adult medical examination without abnormal findings: Secondary | ICD-10-CM | POA: Insufficient documentation

## 2010-10-12 DIAGNOSIS — J40 Bronchitis, not specified as acute or chronic: Secondary | ICD-10-CM | POA: Insufficient documentation

## 2010-10-12 MED ORDER — BENZONATATE 100 MG PO CAPS: 100.0000 mg | ORAL_CAPSULE | Freq: Four times a day (QID) | ORAL | Status: AC | PRN

## 2010-10-12 MED ORDER — AZITHROMYCIN 250 MG PO TABS: ORAL_TABLET | ORAL | Status: DC

## 2010-10-12 MED ORDER — AZITHROMYCIN 250 MG PO TABS: ORAL_TABLET | ORAL | Status: AC

## 2010-10-12 NOTE — Progress Notes (Signed)
  Subjective:    Patient ID: Caroline Myers, female    DOB: Aug 22, 1923, 77 y.o.   MRN: [id number]  HPI  [name] is a 77 yr old female who presents today with chief complaint of cough.  She reports that her symptoms started yesterday and is associated pleuritic chest pain.   She denies associated fever. Husband was recently treated for bronchitis and is taking abx.  She took allegra yesterday for her allergy symptoms, but has not taken anything for cough.  Cough is coarse but she has not visualized any of the sputum.  She denies previous history of similar symptoms.  Reports that she has never been a smoker.     Review of Systems    see HPI  Past Medical History  Diagnosis Date  . Allergy     allergic rhinitis  . Cancer     history of skin cancer of the nose  . Asthma 2000    mild. tried Advair after bronchospasm after exposure to cats  . Shoulder pain, left 05/2010    with rotator cuff tear--Dr [name] Has  . A-fib     pt does not want anticoagulation  . Routine general medical examination at a health care facility   . Aortic stenosis     with aortic insufficiency    History   Social History  . Marital Status: Married    Spouse Name: N/A    Number of Children: N/A  . Years of Education: N/A   Occupational History  . Not on file.   Social History Main Topics  . Smoking status: Never Smoker   . Smokeless tobacco: Never Used  . Alcohol Use: Yes  . Drug Use: No  . Sexually Active: Not on file   Other Topics Concern  . Not on file   Social History Narrative   Regular exercise: yes    Past Surgical History  Procedure Date  . Hernia repair 2003  . Acromioplasty     right shoulder, arthroscopic    No family history on file.  No Known Allergies  Current Outpatient Prescriptions on File Prior to Visit  Medication Sig Dispense Refill  . aspirin 325 MG EC tablet Take 325 mg by mouth daily.        . bisoprolol (ZEBETA) 5 MG tablet Take 1/2 tablet by mouth  daily.       . furosemide (LASIX) 20 MG tablet Take 1/2 tablet by mouth daily.         BP 124/74  Pulse 60  Temp(Src) 98.1 F (36.7 C) (Oral)  Resp 20  Ht 5' 0.98" (1.549 m)  Wt 143 lb (64.864 kg)  BMI 27.03 kg/m2  SpO2 98%    Objective:   Physical Exam  Constitutional:       Elderly white female, awake, alert and in NAD  HENT:  Head: Normocephalic and atraumatic.  Right Ear: Tympanic membrane normal.  Left Ear: Tympanic membrane normal.  Mouth/Throat: Oropharynx is clear and moist. No posterior oropharyngeal erythema.  Eyes: Conjunctivae are normal. Pupils are equal, round, and reactive to light.  Cardiovascular: Normal rate and regular rhythm.   Pulmonary/Chest: Effort normal and breath sounds normal.          Assessment & Plan:

## 2010-10-12 NOTE — Patient Instructions (Signed)
Call if symptoms worsen or do not improve. Follow up with Dr. Artist Pais in 1 week.

## 2010-10-12 NOTE — Assessment & Plan Note (Signed)
77 yr old female with symptoms consistent with bronchitis.  I did recommend that she undergo chest x-ray today which she declines.  Will treat with zithromax and PRN tessalon.  Pt was instructed to go to the ED over the weekend if symptoms worsen, otherwise, follow up in 1 week with Dr. Artist Pais.  Pt verbalizes understanding.

## 2010-10-19 ENCOUNTER — Encounter: Payer: Self-pay | Admitting: Internal Medicine

## 2010-10-19 ENCOUNTER — Ambulatory Visit: Payer: Medicare Other | Admitting: Internal Medicine

## 2010-10-19 ENCOUNTER — Ambulatory Visit (INDEPENDENT_AMBULATORY_CARE_PROVIDER_SITE_OTHER): Payer: Medicare Other | Admitting: Internal Medicine

## 2010-10-19 VITALS — BP 118/70 | HR 58 | Temp 97.9°F | Resp 16 | Wt 141.0 lb

## 2010-10-19 DIAGNOSIS — L509 Urticaria, unspecified: Secondary | ICD-10-CM

## 2010-10-19 DIAGNOSIS — I509 Heart failure, unspecified: Secondary | ICD-10-CM

## 2010-10-19 MED ORDER — METOPROLOL SUCCINATE ER 25 MG PO TB24: 25.0000 mg | ORAL_TABLET | Freq: Every day | ORAL | Status: DC

## 2010-10-19 NOTE — Progress Notes (Signed)
  Subjective:    Patient ID: Caroline Myers, female    DOB: 10/12/1923, 77 y.o.   MRN: [id number]  HPI  77 y/o white female with PAFib eports intermittent urticaria.  Her symptoms worse with minimal scratching.  She notes symptoms associated with starting bisoprolol.  Stopped aspirin due to GI side effects.  She took for 2 months.  Review of Systems    no other new environmental exposure Past Medical History  Diagnosis Date  . Allergy     allergic rhinitis  . Cancer     history of skin cancer of the nose  . Asthma 2000    mild. tried Advair after bronchospasm after exposure to cats  . Shoulder pain, left 05/2010    with rotator cuff tear--Dr [name] Has  . A-fib     pt does not want anticoagulation  . Routine general medical examination at a health care facility   . Aortic stenosis     with aortic insufficiency    History   Social History  . Marital Status: Married    Spouse Name: N/A    Number of Children: N/A  . Years of Education: N/A   Occupational History  . Not on file.   Social History Main Topics  . Smoking status: Never Smoker   . Smokeless tobacco: Never Used  . Alcohol Use: Yes  . Drug Use: No  . Sexually Active: Not on file   Other Topics Concern  . Not on file   Social History Narrative   Regular exercise: yes    Past Surgical History  Procedure Date  . Hernia repair 2003  . Acromioplasty     right shoulder, arthroscopic    No family history on file.  No Known Allergies  Current Outpatient Prescriptions on File Prior to Visit  Medication Sig Dispense Refill  . furosemide (LASIX) 20 MG tablet Take 1/2 tablet by mouth daily.       . benzonatate (TESSALON PERLES) 100 MG capsule Take 1 capsule (100 mg total) by mouth every 6 (six) hours as needed for cough.  30 capsule  0    BP 118/70  Pulse 58  Temp(Src) 97.9 F (36.6 C) (Oral)  Resp 16  Wt 141 lb (63.957 kg)  SpO2 97%    Objective:   Physical Exam     Constitutional: Appears  well-developed and well-nourished. No distress.  Mouth/Throat: Oropharynx is clear and moist.  Eyes: Conjunctivae are normal. Pupils are equal, round, and reactive to light.  Neck: Normal range of motion. Neck supple. No thyromegaly present.       No carotid bruit  Cardiovascular: Normal rate, regular rhythm and normal heart sounds.  Exam reveals no gallop and no friction rub.   No murmur heard. Pulmonary/Chest: Effort normal and breath sounds normal.  No wheezes. No rales.  Skin: mild redness of skin of chest and abdomin   Assessment & Plan:

## 2010-10-20 LAB — BASIC METABOLIC PANEL WITH GFR
Calcium: 9.6 mg/dL (ref 8.4–10.5)
GFR, Est African American: >60 mL/min (ref >60)
GFR, Est Non African American: >60 mL/min (ref >60)
Glucose, Bld: 88 mg/dL (ref 70–99)
Sodium: 138 mEq/L (ref 135–145)

## 2010-10-20 LAB — BRAIN NATRIURETIC PEPTIDE: Brain Natriuretic Peptide: 119.1 pg/mL — ABNORMAL HIGH (ref 0.0–100.0)

## 2010-10-22 ENCOUNTER — Telehealth: Payer: Self-pay | Admitting: Internal Medicine

## 2010-10-22 NOTE — Telephone Encounter (Signed)
Call pt - electrolytes and kidney function are normal

## 2010-10-22 NOTE — Telephone Encounter (Signed)
Call was placed to patient at  [phone],Caroline Myers was informed per Dr Artist Pais instructions. Caroline Myers states that Caroline Myers has a lot going on this month and Caroline Myers would like to continue taking the Bisoprolol at 10 mg, for this month and discuss changing medication at a later time. Caroline Myers states Caroline Myers does not want to start the new medication just yet

## 2010-10-22 NOTE — Telephone Encounter (Signed)
Noted  

## 2010-10-23 NOTE — Telephone Encounter (Signed)
Call placed to patient at [phone] no answer, A detailed voice message was left informing patient Dr Artist Pais aware of her decision. She was advised to call back if any changes were made.

## 2010-11-02 ENCOUNTER — Encounter: Payer: Self-pay | Admitting: Internal Medicine

## 2010-11-02 ENCOUNTER — Ambulatory Visit (INDEPENDENT_AMBULATORY_CARE_PROVIDER_SITE_OTHER): Payer: Medicare Other | Admitting: Internal Medicine

## 2010-11-02 DIAGNOSIS — R109 Unspecified abdominal pain: Secondary | ICD-10-CM

## 2010-11-02 LAB — CBC WITH DIFFERENTIAL/PLATELET
Basophils Relative: 1 % (ref 0–1)
Eosinophils Absolute: 0.3 10*3/uL (ref 0.0–0.7)
Eosinophils Relative: 4 % (ref 0–5)
HCT: 44.3 % (ref 36.0–46.0)
Hemoglobin: 14.8 g/dL (ref 12.0–15.0)
MCH: 32.4 pg (ref 26.0–34.0)
MCHC: 33.4 g/dL (ref 30.0–36.0)
MCV: 96.9 fL (ref 78.0–100.0)
Monocytes Absolute: 1 10*3/uL (ref 0.1–1.0)
Monocytes Relative: 13 % — ABNORMAL HIGH (ref 3–12)
RDW: 13 % (ref 11.5–15.5)

## 2010-11-02 LAB — HEPATIC FUNCTION PANEL
AST: 22 U/L (ref 0–37)
Alkaline Phosphatase: 104 U/L (ref 39–117)
Indirect Bilirubin: 0.5 mg/dL (ref 0.0–0.9)
Total Bilirubin: 0.6 mg/dL (ref 0.3–1.2)

## 2010-11-02 LAB — BASIC METABOLIC PANEL WITH GFR
CO2: 30 mEq/L (ref 19–32)
Chloride: 100 mEq/L (ref 96–112)
GFR, Est Non African American: >60 mL/min (ref >60)
Potassium: 4.8 mEq/L (ref 3.5–5.3)

## 2010-11-02 MED ORDER — SUCRALFATE 1 GM/10ML PO SUSP: 1.0000 g | Freq: Two times a day (BID) | ORAL | Status: DC

## 2010-11-02 MED ORDER — ESOMEPRAZOLE MAGNESIUM 40 MG PO CPDR: 40.0000 mg | DELAYED_RELEASE_CAPSULE | Freq: Every day | ORAL | Status: DC

## 2010-11-02 NOTE — Progress Notes (Signed)
  Subjective:    Patient ID: Caroline Myers, female    DOB: 02/19/1924, 77 y.o.   MRN: [id number]  HPI    Review of Systems     Past Medical History  Diagnosis Date  . Allergy     allergic rhinitis  . Cancer     history of skin cancer of the nose  . Asthma 2000    mild. tried Advair after bronchospasm after exposure to cats  . Shoulder pain, left 05/2010    with rotator cuff tear--Dr [name] Has  . A-fib     pt does not want anticoagulation  . Routine general medical examination at a health care facility   . Aortic stenosis     with aortic insufficiency    History   Social History  . Marital Status: Married    Spouse Name: N/A    Number of Children: N/A  . Years of Education: N/A   Occupational History  . Not on file.   Social History Main Topics  . Smoking status: Never Smoker   . Smokeless tobacco: Never Used  . Alcohol Use: Yes  . Drug Use: No  . Sexually Active: Not on file   Other Topics Concern  . Not on file   Social History Narrative   Regular exercise: yes    Past Surgical History  Procedure Date  . Hernia repair 2003  . Acromioplasty     right shoulder, arthroscopic    No family history on file.  No Known Allergies  Current Outpatient Prescriptions on File Prior to Visit  Medication Sig Dispense Refill  . furosemide (LASIX) 20 MG tablet Take 1/2 tablet by mouth daily.       . benzonatate (TESSALON PERLES) 100 MG capsule Take 1 capsule (100 mg total) by mouth every 6 (six) hours as needed for cough.  30 capsule  0  . loratadine (CLARITIN) 10 MG tablet Take 1 tablet (10 mg total) by mouth daily.  30 tablet  11  . metoprolol succinate (TOPROL-XL) 25 MG 24 hr tablet Take 1 tablet (25 mg total) by mouth daily.  30 tablet  3    BP 98/50  Pulse 61  Temp(Src) 97.9 F (36.6 C) (Oral)  Resp 20  Wt 144 lb (65.318 kg)  SpO2 98%    Objective:   Physical Exam        Assessment & Plan:

## 2010-11-02 NOTE — Patient Instructions (Signed)
Please call our office if your symptoms do not improve or gets worse.  

## 2010-11-06 ENCOUNTER — Ambulatory Visit (HOSPITAL_BASED_OUTPATIENT_CLINIC_OR_DEPARTMENT_OTHER)
Admission: RE | Admit: 2010-11-06 | Discharge: 2010-11-06 | Disposition: A | Discharge status: Home / Self Care | Payer: Medicare Other | Source: Ambulatory Visit | Attending: Internal Medicine | Admitting: Internal Medicine

## 2010-11-06 ENCOUNTER — Encounter: Payer: Self-pay | Admitting: Internal Medicine

## 2010-11-06 DIAGNOSIS — I714 Abdominal aortic aneurysm, without rupture, unspecified: Secondary | ICD-10-CM | POA: Insufficient documentation

## 2010-11-06 DIAGNOSIS — R1011 Right upper quadrant pain: Secondary | ICD-10-CM | POA: Insufficient documentation

## 2010-11-06 DIAGNOSIS — L509 Urticaria, unspecified: Secondary | ICD-10-CM | POA: Insufficient documentation

## 2010-11-06 NOTE — Assessment & Plan Note (Signed)
Improved.  Pt appears euvolemic.  Continue low dose lasix. Monitor BMET

## 2010-11-06 NOTE — Assessment & Plan Note (Signed)
Pt experiencing intermittent urticaria possibly related to  Bisoprolol.  Switch to metoprolol.  Continue to use zyrtec as needed

## 2010-11-16 ENCOUNTER — Ambulatory Visit (INDEPENDENT_AMBULATORY_CARE_PROVIDER_SITE_OTHER): Payer: Medicare Other | Admitting: Internal Medicine

## 2010-11-16 ENCOUNTER — Encounter: Payer: Self-pay | Admitting: Internal Medicine

## 2010-11-16 ENCOUNTER — Other Ambulatory Visit: Payer: Self-pay | Admitting: *Deleted

## 2010-11-16 VITALS — BP 110/70 | HR 49 | Temp 98.3°F | Resp 18 | Wt 143.0 lb

## 2010-11-16 DIAGNOSIS — Z0289 Encounter for other administrative examinations: Secondary | ICD-10-CM

## 2010-11-16 DIAGNOSIS — K219 Gastro-esophageal reflux disease without esophagitis: Secondary | ICD-10-CM

## 2010-11-16 MED ORDER — ESOMEPRAZOLE MAGNESIUM 40 MG PO CPDR: 40.0000 mg | DELAYED_RELEASE_CAPSULE | Freq: Every day | ORAL | Status: DC

## 2010-11-16 NOTE — Op Note (Signed)
NAME:  Caroline Myers, Caroline Myers                        ACCOUNT NO.:  [id number]   MEDICAL RECORD NO.:  [id number]                   PATIENT TYPE:  AMB   LOCATION:  DSC                                  FACILITY:  [facility]   PHYSICIAN:  Cindee Salt, M.D.                    DATE OF BIRTH:  1923/09/23   DATE OF PROCEDURE:  02/15/2004  DATE OF DISCHARGE:                                 OPERATIVE REPORT   PREOPERATIVE DIAGNOSIS:  Carpal tunnel syndrome, left hand.   POSTOPERATIVE DIAGNOSIS:  Carpal tunnel syndrome, left hand.   OPERATION PERFORMED:  Decompression, left median nerve.   SURGEON:  Cindee Salt, M.D.   ASSISTANTCarolyne Fiscal.   ANESTHESIA:  Forearm based IV regional.   INDICATIONS FOR PROCEDURE:  The patient is a 77 year old female with a  history of carpal tunnel syndrome, EMG nerve conduction studies positive,  which has not responded to conservative treatment.   DESCRIPTION OF PROCEDURE:  The patient was brought to the operating room  where forearm based IV regional anesthetic was carried out without  difficulty.  She was prepped using DuraPrep in supine position, left arm  free, longitudinal incision was made in the palm and carried down through  subcutaneous tissue.  Bleeders were electrocauterized.  Palmar fascia was  split.  Superficial palmar arch identified.  The flexor to the ring and  little finger identified to the ulnar side of the medial nerve.  The carpal  retinaculum was incised with sharp dissection.  Right angle and Sewell  retractor placed between the skin and forearm fascia.  The fascia was  released for approximately 1.5 cm proximal to the wrist crease under direct  vision.  Canal was explored and no further lesions were identified.  The  wound was irrigated.  Skin was closed with interrupted 5-0 nylon sutures.  Sterile compressive dressing and splint applied.  The patient tolerated the  procedure well and was taken to the recovery room for observation in  satisfactory condition.  She is discharged to home to return to the [facility] [place] in one week on Vicodin.                                               Cindee Salt, M.D.    GK/MEDQ  D:  02/15/2004  T:  02/15/2004  Job:  [id number]

## 2010-11-16 NOTE — Op Note (Signed)
[facility]  Patient:    Caroline Myers, Caroline Myers                         MRN: [id number] Proc. Date: 09/15/00 Attending:  Angelia Mould. Derrell Lolling, M.D. CC:         Winn Jock. Earl Gala, M.D.  Aura Dials, M.D.   Operative Report  PREOPERATIVE DIAGNOSIS:  Right femoral hernia.  POSTOPERATIVE DIAGNOSIS:  Right femoral hernia.  PROCEDURE PERFORMED:  Repair of right femoral hernia with mesh (McVay repair).  SURGEON:  Angelia Mould. Derrell Lolling, M.D.  INDICATIONS:  This is a 77 year old white female who recently developed moderately severe pain and a bulge in her right groin.  She had some lower abdominal cramping and nausea at that time.  The lump got much smaller and she felt much better.  On exam, she has a small femoral hernia.  She is brought to the operating room electively for repair of her right groin hernia.  FINDINGS:  The patient had an incarcerated right femoral hernia containing fatty tissue.  We were able to reduce this without much difficulty.  The round ligament was taken down and a search was made for an indirect and also for a direct inguinal hernia, but she did not appear to have any significant inguinal hernia.  DESCRIPTION OF PROCEDURE:  The patient was brought to the operating room and placed supine on the operating table.  She was monitored and sedated by the anesthesia department.  Xylocaine 1% with epinephrine was used as a local infiltration anesthetic.  A linear incision was made overlying the inguinal ligament.  Dissection was carried down through the subcutaneous tissue.  The aponeurosis of the external oblique was identified, incised in the direction of its fibers opening up the small external inguinal ring.  The external oblique was retracted.  The round ligament and associated small vessels were isolated and divided at the level of the pubic tubercle.  The round ligament was then dissected back to the level of the internal inguinal ring.  It  was dissected free from lipoma.  This area was resected and the stump of that was ligated with a 3-0 Vicryl tie.  There did not appear to be an indirect hernia sac.  The round ligament was clamped and divided at the level of the internal ring and tied off with a 2-0 Vicryl tie.  We then took down the inguinal ligament medially.  We identified and incarcerated the femoral hernia and reduced the contents.  We dissected out and exposed Coopers ligament very carefully.  After reducing the femoral hernia this way, we felt that the best way to repair the hernia would be an onlay graft of polypropylene mesh in a McVay repair.  A 3 inch x 6 inch piece of polypropylene mesh was then brought to the operative field, and cut into an appropriate elliptical shape.  The mesh was sutured in place with interrupted mattress suture of #1 Surgilon.  The mesh was sutured so as to generously overlap the fascia at the pubic tubercle and along Coopers ligament inferiorly.  Two sutures were placed superiorly in Coopers ligament and a third transition stitch was placed through the mesh, into Coopers ligament, and then transitioning out to the inguinal ligament, and then back through the mesh.  After all of Coopers ligament sutures were placed, they were then individually tied under direct vision.  A few other sutures were placed through the mesh and into the inguinal  ligament laterally, and several interrupted mattress sutures of the Surgilon were placed superiorly to secure the mesh to the internal oblique and transversus abdominis muscles above. This provided a very secure repair of the inguinal floor as well as the femoral space was obliterated.  Hemostasis was excellent.  The wound was irrigated with saline.  The external oblique was closed with a running suture of 2-0 Vicryl, Scarpas fascia was closed with 3-0 Vicryl, and the skin closed with a running subcuticular suture of 4-0 Vicryl and Steri-Strips.  A  clean bandage was placed and the patient taken to the recovery room in stable condition.  Estimated blood loss was about 10 cc.  Complications were none. Sponge, needle, and instrument counts were correct. DD:  09/15/00 TD:  09/16/00 Job: [id number]

## 2010-11-16 NOTE — Op Note (Signed)
[facility]  Patient:    Caroline Myers, Caroline Myers Visit Number: [id number] MRN: [id number]          Service Type: DSU Location: [facility] Attending Physician:  Ronne Binning Dictated by:   Nicki Reaper, M.D. Proc. Date: 12/15/01 Admit Date:  12/15/2001   CC:         Nicki Reaper, M.D. 2 copies   Operative Report  PREOPERATIVE DIAGNOSIS:  Carpal tunnel syndrome, right hand.  POSTOPERATIVE DIAGNOSIS:  Carpal tunnel syndrome, right hand.  OPERATION:  Decompression of right median nerve.  SURGEON:  Nicki Reaper, M.D.  ANESTHESIA:  Forearm based IV regional.  ANESTHESIOLOGIST:  Dr. Katrinka Blazing.  HISTORY OF PRESENT ILLNESS:  The patient is a 77 year old female with a history of carpal tunnel syndrome, EMG nerve conductions positive, which has not responded to conservative treatment.  DESCRIPTION OF PROCEDURE:  The patient was brought to the operating room where a forearm based IV regional anesthetic was carried out without difficulty. She was prepped and draped using Betadine scrubbing solution.  With the right arm free, a longitudinal incision was made at the palm, carried down through the subcutaneous tissue.  This termination of the old scar, the palmar fascia was removed to prevent further contracture.  The dissection was carried down splitting the remainder of the palmar fascia.  The superficial palmar arch was identified, along with the flexor tendon of the ring and little finger to the ulnar side of the median nerve.  The carpal retinaculum was incised with sharp dissection.  A right angle and Sewel retractor were placed between skin and forearm fascia, the fascia was released for approximately 3 cm proximal to the wrist crease under direct vision.  Canal was explored, no further lesions were identified.  The wound was irrigated.  Skin was closed with interrupted 5-0 nylon sutures.  A sterile compressive dressing and splint was applied.   The patient tolerated the procedure well, was taken to the recovery room for observation in satisfactory condition.  FOLLOWUP:  She is discharged home to return to the [facility] [place] in one week.  DISCHARGE MEDICATIONS: 1. Vicodin. 2. Keflex. Dictated by:   Nicki Reaper, M.D. Attending Physician:  Ronne Binning DD:  12/15/01 TD:  12/16/01 Job: [id number]

## 2010-11-16 NOTE — Op Note (Signed)
NAME:  Caroline Myers, Caroline Myers                        ACCOUNT NO.:  [id number]   MEDICAL RECORD NO.:  [id number]                   PATIENT TYPE:  AMB   LOCATION:  DSC                                  FACILITY:  [facility]   PHYSICIAN:  Lubertha Basque. Jerl Santos, M.D.             DATE OF BIRTH:  May 26, 1924   DATE OF PROCEDURE:  07/06/2002  DATE OF DISCHARGE:                                 OPERATIVE REPORT   PREOPERATIVE DIAGNOSIS:  1. Right shoulder impingement.  2. Right shoulder AC degeneration.  3. Right shoulder biceps tear.  4. Right shoulder rotator cuff tear.   POSTOPERATIVE DIAGNOSIS:  1. Right shoulder impingement.  2. Right shoulder AC degeneration.  3. Right shoulder biceps tear.  4. Right shoulder rotator cuff tear.   PROCEDURE:  1. Right shoulder arthroscopic acromioplasty.  2. Right shoulder arthroscopic AC resection.  3. Right shoulder arthroscopic debridement.   ANESTHESIA:  General and block.   SURGEON:  Lubertha Basque. Jerl Santos, M.D.   ASSISTANT:  Lindwood Qua, P.A.   INDICATIONS FOR PROCEDURE:  The patient is a 77 year old woman with a long  history of right shoulder pain. This did respond transiently to an  injection, but she has been left with pain at rest and pain with activity.  An MRI scan shows a conglomeration of significant findings as listed above.  She has a massive rotator cuff tear and is offered a simple debridement at  this point in hopes of improving her situation.  The procedure had been  discussed with the patient and informed operative consent was obtained after  discussion of possible complications of, reaction to anesthesia, and  infection.   DESCRIPTION OF PROCEDURE:  The patient was taken to the operating room where  general anesthesia was applied without difficulty.  She was also given a  block in the preanesthesia area.  She was positioned in the beach chair  position and prepped and draped in the usual sterile fashion.  After  administration of  preoperative IV antibiotics, an arthroscopy of the right  shoulder was done through a total of three portals.  Glenohumeral joint  showed significant degenerative change on the humeral head with grade IV  small areas seen.  She also had some flaps of articular cartilage addressed  with a chondroplasty back to stable structures.  There was a stump of the  biceps tendon present on the joint and this was removed. She had a massive  rotator cuff tear which I debrided back to the area of the glenoid.  There  was a sharp subacromial morphology addressed with a brief acromioplasty back  to a flat surface.  I released the coracoacromial ligament, but did not  resect this structure.  She had a very sharp spur also under her AC joint  and a formal AC decompression was done through the additional anterior  portal. The shoulder was thoroughly irrigated followed by placement of  Marcaine  with epinephrine and morphine. Simple sutures of nylon were used to  loosely reapproximate the portals followed by Adaptic and dry gauze dressing  with tape.  Estimated blood loss and intraoperative fluids were obtained  from anesthesia records.   DISPOSITION:  The patient was extubated in the operating room and taken to  the recovery room in stable condition.   PLAN:  She will go home the same day and follow up in the office in less  than a week.  I will contact her by phone tonight.                                               Lubertha Basque Jerl Santos, M.D.    PGD/MEDQ  D:  07/06/2002  T:  07/06/2002  Job:  [id number]

## 2010-11-16 NOTE — Progress Notes (Signed)
  Subjective:    Patient ID: Caroline Myers, female    DOB: August 19, 1923, 77 y.o.   MRN: [id number]  HPI    Review of Systems  Past Medical History  Diagnosis Date  . Allergy     allergic rhinitis  . Cancer     history of skin cancer of the nose  . Asthma 2000    mild. tried Advair after bronchospasm after exposure to cats  . Shoulder pain, left 05/2010    with rotator cuff tear--Dr [name] Has  . Paroxysmal atrial fibrillation     pt does not want anticoagulation  . Aortic stenosis     mild stenosis. Mild to moderate regurgitation    History   Social History  . Marital Status: Married    Spouse Name: N/A    Number of Children: N/A  . Years of Education: N/A   Occupational History  . Not on file.   Social History Main Topics  . Smoking status: Never Smoker   . Smokeless tobacco: Never Used  . Alcohol Use: Yes  . Drug Use: No  . Sexually Active: Not on file   Other Topics Concern  . Not on file   Social History Narrative   Regular exercise: yes    Past Surgical History  Procedure Date  . Hernia repair 2003  . Acromioplasty     right shoulder, arthroscopic    No family history on file.  No Known Allergies  Current Outpatient Prescriptions on File Prior to Visit  Medication Sig Dispense Refill  . bisoprolol (ZEBETA) 10 MG tablet Take 10 mg by mouth daily.        Marland Kitchen esomeprazole (NEXIUM) 40 MG capsule Take 1 capsule (40 mg total) by mouth daily.  30 capsule  0  . furosemide (LASIX) 20 MG tablet Take 1/2 tablet by mouth daily.       . benzonatate (TESSALON PERLES) 100 MG capsule Take 1 capsule (100 mg total) by mouth every 6 (six) hours as needed for cough.  30 capsule  0  . loratadine (CLARITIN) 10 MG tablet Take 1 tablet (10 mg total) by mouth daily.  30 tablet  11  . metoprolol succinate (TOPROL-XL) 25 MG 24 hr tablet Take 1 tablet (25 mg total) by mouth daily.  30 tablet  3  . sucralfate (CARAFATE) 1 GM/10ML suspension Take 10 mLs (1 g total) by mouth 2  (two) times daily.  420 mL  0    BP 80/60  Pulse 49  Temp(Src) 98.3 F (36.8 C) (Oral)  Resp 18  Wt 143 lb (64.864 kg)  SpO2 99%       Objective:   Physical Exam        Assessment & Plan:

## 2010-11-27 ENCOUNTER — Telehealth: Payer: Self-pay | Admitting: *Deleted

## 2010-11-27 MED ORDER — ESOMEPRAZOLE MAGNESIUM 40 MG PO CPDR: 40.0000 mg | DELAYED_RELEASE_CAPSULE | Freq: Every day | ORAL | Status: DC

## 2010-11-27 MED ORDER — FUROSEMIDE 20 MG PO TABS: ORAL_TABLET | ORAL | Status: DC

## 2010-11-27 NOTE — Telephone Encounter (Signed)
Pt called stating she needed a refill on nexium and furosemide. Called [facility] and spoke to Belle. Gave Nexium 40mg  1 qd #30 x no refills. Furosemide 20mg  1/2 tablet daily # 15 x no refills. Pt notified.

## 2010-12-14 ENCOUNTER — Ambulatory Visit: Payer: Medicare Other | Admitting: Family

## 2010-12-14 ENCOUNTER — Ambulatory Visit (INDEPENDENT_AMBULATORY_CARE_PROVIDER_SITE_OTHER): Payer: Medicare Other | Admitting: Family Medicine

## 2010-12-14 ENCOUNTER — Encounter: Payer: Self-pay | Admitting: Family Medicine

## 2010-12-14 ENCOUNTER — Ambulatory Visit: Payer: Medicare Other | Admitting: Internal Medicine

## 2010-12-14 VITALS — BP 115/69 | HR 60 | Temp 97.5°F | Resp 16 | Wt 143.0 lb

## 2010-12-14 DIAGNOSIS — I4891 Unspecified atrial fibrillation: Secondary | ICD-10-CM

## 2010-12-14 MED ORDER — VERAPAMIL HCL ER 120 MG PO TBCR: 120.0000 mg | EXTENDED_RELEASE_TABLET | Freq: Every day | ORAL | Status: DC

## 2010-12-14 NOTE — Progress Notes (Signed)
OFFICE NOTE  12/14/2010  CC:  Chief Complaint  Patient presents with  . Hypertension  . Medication Refill    dicuss changing medication     HPI:   Patient is a 77 y.o. Caucasian female who is here for f/u atrial fibrillation (paroxysmal). Was on metoprolol--too much itching.  Changed to bisoprolol, unclear why but changed back to metoprolol--feels itchy still and says she feels fatigued (she's not sure if this is from metop or furosemide).  Asks if she can get of meds/switch meds. Reviewed stroke risk in a-fib; patient resistant to the idea of taking coumadin.  Tried aspirin for months but had so much GI upset that she required nexium daily and still had symptoms.  She has therefore stopped aspirin and is hesitant to start any anticoagulant despite known stroke risk with a-fib. Reviewed home bp log: some normal and some 80s-90s systolic, HR 60s mostly. Reviewed cardiac echo done 08/2010: normal LV function, mild LAE, mild AS, mild/mod AR. No mitral valve disease.  Pertinent PMH:  PAF Anxiety DOE  MEDS;   Outpatient Prescriptions Prior to Visit  Medication Sig Dispense Refill  . esomeprazole (NEXIUM) 40 MG capsule Take 1 capsule (40 mg total) by mouth daily.  30 capsule  0  . furosemide (LASIX) 20 MG tablet Take 1/2 tablet by mouth daily.  15 tablet  0  . metoprolol succinate (TOPROL-XL) 25 MG 24 hr tablet Take 1 tablet (25 mg total) by mouth daily.  30 tablet  3  . benzonatate (TESSALON PERLES) 100 MG capsule Take 1 capsule (100 mg total) by mouth every 6 (six) hours as needed for cough.  30 capsule  0  . loratadine (CLARITIN) 10 MG tablet Take 1 tablet (10 mg total) by mouth daily.  30 tablet  11    PE: Blood pressure 115/69, pulse 60, temperature 97.5 F (36.4 C), temperature source Oral, resp. rate 16, weight 143 lb (64.864 kg). Gen: Alert, well appearing.  Patient is oriented to person, place, time, and situation. Neck: supple, ROM full.  Carotids 2+ bilat, without bruit.   No lymphadenopathy, thyromegaly, or mass. Chest: symmetric expansion, nonlabored respirations.  Clear and equal breath sounds in all lung fields.   CV: RRR with occasional ectopic beat, 2/6 systolic murmur at base.  Peripheral pulses 2+ and symmetric. EXT: no clubbing, cyanosis, or edema.    IMPRESSION AND PLAN:  ATRIAL FIBRILLATION Intolerant of two beta blockers. Will change to a trial of verapamil 120 mg qd. She may d/c her lasix. She is going to take some time to think more about the question of anticoagulation, possibly discuss it with her children, f/u and rediscuss this in 35mo.  I think she would be fine for coumadin, but she is mentally against this med. Pradaxa is another possibility, as she has no hemodynamically significant valvular disease and no prosthetic heart valves. I mentioned the possibility of getting her back in to see the cardiologist for expert opinion/recommendations surrounding this issue of anticoagulation and she chose to hold off on this at this time.     FOLLOW UP:  Return in about 1 month (around 01/13/2011).

## 2010-12-14 NOTE — Assessment & Plan Note (Signed)
Intolerant of two beta blockers. Will change to a trial of verapamil 120 mg qd. She may d/c her lasix. She is going to take some time to think more about the question of anticoagulation, possibly discuss it with her children, f/u and rediscuss this in 27mo.  I think she would be fine for coumadin, but she is mentally against this med. Pradaxa is another possibility, as she has no hemodynamically significant valvular disease and no prosthetic heart valves. I mentioned the possibility of getting her back in to see the cardiologist for expert opinion/recommendations surrounding this issue of anticoagulation and she chose to hold off on this at this time.

## 2010-12-18 NOTE — Telephone Encounter (Signed)
No action required.

## 2011-01-04 ENCOUNTER — Encounter: Payer: Self-pay | Admitting: Cardiology

## 2011-01-23 ENCOUNTER — Ambulatory Visit: Payer: Medicare Other | Admitting: Internal Medicine

## 2011-02-04 ENCOUNTER — Telehealth: Payer: Self-pay | Admitting: Family Medicine

## 2011-02-04 NOTE — Telephone Encounter (Signed)
Pls call her and have her arrange f/u appt with whatever provider she wants (I saw her in June when I was filling in at [facility], and I see that she cancelled her 01/23/11 follow up visit there.  I want her to know that she should still get follow up regarding her atrial fibrillation and the question of anticoagulation).  Thank you--PM

## 2011-03-27 ENCOUNTER — Telehealth: Payer: Self-pay | Admitting: Family Medicine

## 2011-03-27 NOTE — Telephone Encounter (Signed)
Talked to patient on phone today to make sure she had arrangements for f/u of her a-fib and the question of anticoagulation for this. She informed me that since I saw her in June at the [facility] office she has found a new primary care doctor.--PM

## 2011-04-04 ENCOUNTER — Telehealth: Payer: Self-pay | Admitting: *Deleted

## 2011-04-04 NOTE — Telephone Encounter (Signed)
Lab is requesting additional dx code for [facility] 10/19/10 for BNP. 428.0 will not cover test. Is there another supporting dx code that we could use?

## 2011-04-05 NOTE — Telephone Encounter (Signed)
Use shortness of breath code

## 2011-04-05 NOTE — Telephone Encounter (Signed)
Spoke to [employer] and provided 786.05. Code will cover test.

## 2011-07-18 DIAGNOSIS — R0609 Other forms of dyspnea: Secondary | ICD-10-CM | POA: Diagnosis not present

## 2011-07-18 DIAGNOSIS — I4891 Unspecified atrial fibrillation: Secondary | ICD-10-CM | POA: Diagnosis not present

## 2011-07-18 DIAGNOSIS — M545 Low back pain: Secondary | ICD-10-CM | POA: Diagnosis not present

## 2011-08-01 ENCOUNTER — Other Ambulatory Visit (HOSPITAL_COMMUNITY): Payer: Self-pay | Admitting: Geriatric Medicine

## 2011-08-01 DIAGNOSIS — Z1231 Encounter for screening mammogram for malignant neoplasm of breast: Secondary | ICD-10-CM

## 2011-08-02 ENCOUNTER — Ambulatory Visit (HOSPITAL_COMMUNITY)
Admission: RE | Admit: 2011-08-02 | Discharge: 2011-08-02 | Disposition: A | Discharge status: Home / Self Care | Payer: Medicare Other | Source: Ambulatory Visit | Attending: Geriatric Medicine | Admitting: Geriatric Medicine

## 2011-08-02 DIAGNOSIS — Z1231 Encounter for screening mammogram for malignant neoplasm of breast: Secondary | ICD-10-CM | POA: Insufficient documentation

## 2011-08-08 DIAGNOSIS — M545 Low back pain: Secondary | ICD-10-CM | POA: Diagnosis not present

## 2011-08-13 DIAGNOSIS — M545 Low back pain: Secondary | ICD-10-CM | POA: Diagnosis not present

## 2011-08-21 DIAGNOSIS — M545 Low back pain: Secondary | ICD-10-CM | POA: Diagnosis not present

## 2011-08-28 DIAGNOSIS — M545 Low back pain: Secondary | ICD-10-CM | POA: Diagnosis not present

## 2011-09-03 DIAGNOSIS — Z961 Presence of intraocular lens: Secondary | ICD-10-CM | POA: Diagnosis not present

## 2011-09-03 DIAGNOSIS — H02839 Dermatochalasis of unspecified eye, unspecified eyelid: Secondary | ICD-10-CM | POA: Diagnosis not present

## 2011-09-03 DIAGNOSIS — H52209 Unspecified astigmatism, unspecified eye: Secondary | ICD-10-CM | POA: Diagnosis not present

## 2011-09-03 DIAGNOSIS — D2310 Other benign neoplasm of skin of unspecified eyelid, including canthus: Secondary | ICD-10-CM | POA: Diagnosis not present

## 2011-09-05 DIAGNOSIS — M48061 Spinal stenosis, lumbar region without neurogenic claudication: Secondary | ICD-10-CM | POA: Diagnosis not present

## 2011-09-18 ENCOUNTER — Other Ambulatory Visit: Payer: Self-pay | Admitting: Sports Medicine

## 2011-09-18 DIAGNOSIS — M5430 Sciatica, unspecified side: Secondary | ICD-10-CM | POA: Diagnosis not present

## 2011-09-18 DIAGNOSIS — M545 Low back pain: Secondary | ICD-10-CM

## 2011-09-22 ENCOUNTER — Ambulatory Visit
Admission: RE | Admit: 2011-09-22 | Discharge: 2011-09-22 | Disposition: A | Discharge status: Home / Self Care | Payer: Medicare Other | Source: Ambulatory Visit | Attending: Sports Medicine | Admitting: Sports Medicine

## 2011-09-22 DIAGNOSIS — M5137 Other intervertebral disc degeneration, lumbosacral region: Secondary | ICD-10-CM | POA: Diagnosis not present

## 2011-09-22 DIAGNOSIS — M47817 Spondylosis without myelopathy or radiculopathy, lumbosacral region: Secondary | ICD-10-CM | POA: Diagnosis not present

## 2011-09-22 DIAGNOSIS — B370 Candidal stomatitis: Secondary | ICD-10-CM | POA: Diagnosis not present

## 2011-09-22 DIAGNOSIS — M5126 Other intervertebral disc displacement, lumbar region: Secondary | ICD-10-CM | POA: Diagnosis not present

## 2011-09-22 DIAGNOSIS — M545 Low back pain: Secondary | ICD-10-CM

## 2011-09-25 ENCOUNTER — Other Ambulatory Visit: Payer: Self-pay | Admitting: Sports Medicine

## 2011-09-25 DIAGNOSIS — M549 Dorsalgia, unspecified: Secondary | ICD-10-CM

## 2011-09-26 ENCOUNTER — Ambulatory Visit
Admission: RE | Admit: 2011-09-26 | Discharge: 2011-09-26 | Disposition: A | Discharge status: Home / Self Care | Payer: Medicare Other | Source: Ambulatory Visit | Attending: Sports Medicine | Admitting: Sports Medicine

## 2011-09-26 VITALS — BP 164/73 | HR 51

## 2011-09-26 DIAGNOSIS — M5126 Other intervertebral disc displacement, lumbar region: Secondary | ICD-10-CM

## 2011-09-26 DIAGNOSIS — M549 Dorsalgia, unspecified: Secondary | ICD-10-CM

## 2011-09-26 DIAGNOSIS — M47817 Spondylosis without myelopathy or radiculopathy, lumbosacral region: Secondary | ICD-10-CM | POA: Diagnosis not present

## 2011-09-26 MED ORDER — IOHEXOL 180 MG/ML  SOLN
1.0000 mL | Freq: Once | INTRAMUSCULAR | Status: AC | PRN
  Administered 2011-09-26: 1 mL via EPIDURAL

## 2011-09-26 MED ORDER — METHYLPREDNISOLONE ACETATE 40 MG/ML INJ SUSP (RADIOLOG
120.0000 mg | Freq: Once | INTRAMUSCULAR | Status: AC
  Administered 2011-09-26: 120 mg via EPIDURAL

## 2011-09-26 NOTE — Discharge Instructions (Signed)

## 2011-10-02 ENCOUNTER — Other Ambulatory Visit: Payer: Self-pay | Admitting: Sports Medicine

## 2011-10-02 DIAGNOSIS — M549 Dorsalgia, unspecified: Secondary | ICD-10-CM

## 2011-10-11 ENCOUNTER — Ambulatory Visit
Admission: RE | Admit: 2011-10-11 | Discharge: 2011-10-11 | Disposition: A | Discharge status: Home / Self Care | Payer: Medicare Other | Source: Ambulatory Visit | Attending: Sports Medicine | Admitting: Sports Medicine

## 2011-10-11 VITALS — BP 138/62 | HR 57

## 2011-10-11 DIAGNOSIS — M47817 Spondylosis without myelopathy or radiculopathy, lumbosacral region: Secondary | ICD-10-CM | POA: Diagnosis not present

## 2011-10-11 DIAGNOSIS — M5126 Other intervertebral disc displacement, lumbar region: Secondary | ICD-10-CM | POA: Diagnosis not present

## 2011-10-11 DIAGNOSIS — M5430 Sciatica, unspecified side: Secondary | ICD-10-CM

## 2011-10-11 DIAGNOSIS — M4120 Other idiopathic scoliosis, site unspecified: Secondary | ICD-10-CM | POA: Diagnosis not present

## 2011-10-11 DIAGNOSIS — M549 Dorsalgia, unspecified: Secondary | ICD-10-CM

## 2011-10-11 MED ORDER — METHYLPREDNISOLONE ACETATE 40 MG/ML INJ SUSP (RADIOLOG
120.0000 mg | Freq: Once | INTRAMUSCULAR | Status: AC
  Administered 2011-10-11: 120 mg via EPIDURAL

## 2011-10-11 MED ORDER — IOHEXOL 180 MG/ML  SOLN
1.0000 mL | Freq: Once | INTRAMUSCULAR | Status: AC | PRN
  Administered 2011-10-11: 1 mL via EPIDURAL

## 2011-10-17 DIAGNOSIS — L821 Other seborrheic keratosis: Secondary | ICD-10-CM | POA: Diagnosis not present

## 2011-10-17 DIAGNOSIS — Z85828 Personal history of other malignant neoplasm of skin: Secondary | ICD-10-CM | POA: Diagnosis not present

## 2011-10-17 DIAGNOSIS — L570 Actinic keratosis: Secondary | ICD-10-CM | POA: Diagnosis not present

## 2011-10-18 DIAGNOSIS — I4891 Unspecified atrial fibrillation: Secondary | ICD-10-CM | POA: Diagnosis not present

## 2011-10-21 DIAGNOSIS — M48061 Spinal stenosis, lumbar region without neurogenic claudication: Secondary | ICD-10-CM | POA: Diagnosis not present

## 2011-10-21 DIAGNOSIS — IMO0002 Reserved for concepts with insufficient information to code with codable children: Secondary | ICD-10-CM | POA: Diagnosis not present

## 2011-11-18 DIAGNOSIS — IMO0002 Reserved for concepts with insufficient information to code with codable children: Secondary | ICD-10-CM | POA: Diagnosis not present

## 2011-12-09 DIAGNOSIS — IMO0002 Reserved for concepts with insufficient information to code with codable children: Secondary | ICD-10-CM | POA: Diagnosis not present

## 2011-12-11 DIAGNOSIS — F411 Generalized anxiety disorder: Secondary | ICD-10-CM | POA: Diagnosis not present

## 2011-12-25 DIAGNOSIS — F411 Generalized anxiety disorder: Secondary | ICD-10-CM | POA: Diagnosis not present

## 2011-12-31 DIAGNOSIS — M545 Low back pain: Secondary | ICD-10-CM | POA: Diagnosis not present

## 2012-01-16 DIAGNOSIS — Z79899 Other long term (current) drug therapy: Secondary | ICD-10-CM | POA: Diagnosis not present

## 2012-01-16 DIAGNOSIS — M545 Low back pain: Secondary | ICD-10-CM | POA: Diagnosis not present

## 2012-01-16 DIAGNOSIS — F411 Generalized anxiety disorder: Secondary | ICD-10-CM | POA: Diagnosis not present

## 2012-01-16 DIAGNOSIS — I4891 Unspecified atrial fibrillation: Secondary | ICD-10-CM | POA: Diagnosis not present

## 2012-01-16 DIAGNOSIS — E7800 Pure hypercholesterolemia, unspecified: Secondary | ICD-10-CM | POA: Diagnosis not present

## 2012-01-16 DIAGNOSIS — Z0000 Encounter for general adult medical examination without abnormal findings: Secondary | ICD-10-CM | POA: Diagnosis not present

## 2012-01-20 DIAGNOSIS — Z79899 Other long term (current) drug therapy: Secondary | ICD-10-CM | POA: Diagnosis not present

## 2012-01-20 DIAGNOSIS — E7800 Pure hypercholesterolemia, unspecified: Secondary | ICD-10-CM | POA: Diagnosis not present

## 2012-01-24 DIAGNOSIS — R0602 Shortness of breath: Secondary | ICD-10-CM | POA: Diagnosis not present

## 2012-02-07 DIAGNOSIS — IMO0002 Reserved for concepts with insufficient information to code with codable children: Secondary | ICD-10-CM | POA: Diagnosis not present

## 2012-02-12 ENCOUNTER — Other Ambulatory Visit: Payer: Self-pay | Admitting: Neurological Surgery

## 2012-03-11 ENCOUNTER — Encounter (HOSPITAL_COMMUNITY): Payer: Self-pay | Admitting: Pharmacy Technician

## 2012-03-13 ENCOUNTER — Encounter (HOSPITAL_COMMUNITY): Admission: RE | Admit: 2012-03-13 | Payer: Medicare Other | Source: Ambulatory Visit

## 2012-03-20 ENCOUNTER — Encounter (HOSPITAL_COMMUNITY): Admission: RE | Admit: 2012-03-20 | Payer: Medicare Other | Source: Ambulatory Visit

## 2012-03-20 ENCOUNTER — Encounter (HOSPITAL_COMMUNITY)
Admission: RE | Admit: 2012-03-20 | Discharge: 2012-03-20 | Disposition: A | Discharge status: Home / Self Care | Payer: Medicare Other | Source: Ambulatory Visit | Attending: Neurological Surgery | Admitting: Neurological Surgery

## 2012-03-20 ENCOUNTER — Other Ambulatory Visit: Payer: Self-pay | Admitting: Neurological Surgery

## 2012-03-20 ENCOUNTER — Encounter (HOSPITAL_COMMUNITY): Payer: Self-pay

## 2012-03-20 HISTORY — DX: Anxiety disorder, unspecified: F41.9

## 2012-03-20 HISTORY — DX: Unspecified osteoarthritis, unspecified site: M19.90

## 2012-03-20 HISTORY — DX: Gastro-esophageal reflux disease without esophagitis: K21.9

## 2012-03-20 LAB — BASIC METABOLIC PANEL
BUN: 24 mg/dL — ABNORMAL HIGH (ref 6–23)
Calcium: 9.4 mg/dL (ref 8.4–10.5)
Chloride: 102 mEq/L (ref 96–112)
Creatinine, Ser: 0.8 mg/dL (ref 0.50–1.10)
GFR calc Af Amer: 76 mL/min — ABNORMAL LOW (ref >90)
GFR calc non Af Amer: 65 mL/min — ABNORMAL LOW (ref >90)

## 2012-03-20 LAB — CBC WITH DIFFERENTIAL/PLATELET
Basophils Absolute: 0.1 10*3/uL (ref 0.0–0.1)
Basophils Relative: 1 % (ref 0–1)
Lymphocytes Relative: 13 % (ref 12–46)
MCHC: 33.4 g/dL (ref 30.0–36.0)
Monocytes Absolute: 1.1 10*3/uL — ABNORMAL HIGH (ref 0.1–1.0)
Neutro Abs: 6.4 10*3/uL (ref 1.7–7.7)
Neutrophils Relative %: 70 % (ref 43–77)
Platelets: 265 10*3/uL (ref 150–400)
RDW: 13.2 % (ref 11.5–15.5)
WBC: 9.2 10*3/uL (ref 4.0–10.5)

## 2012-03-20 LAB — PROTIME-INR
INR: 1.01 (ref 0.00–1.49)
Prothrombin Time: 13.2 seconds (ref 11.6–15.2)

## 2012-03-20 LAB — SURGICAL PCR SCREEN
MRSA, PCR: NEGATIVE
Staphylococcus aureus: NEGATIVE

## 2012-03-20 NOTE — Consult Note (Addendum)
Anesthesia Consult:  Patient is a 78 year old female scheduled for right L4-5 microdiskectomy on 04/01/12 by Dr. Yetta Barre.    History includes non-smoker, paroxysmal afib, AS (mild AS with mild to moderate AI by March 2012 echo), chronic DOE, GERD, anxiety, asthma.  PCP is Dr. Merlene Laughter.    Cardiologist is Dr. Eldridge Dace with [facility].  (She was previously followed by Dr. Jens Som at [facility].)  She was last seen on 10/18/11. Her last EKG at [facility] on 01/24/12 showed SB with first degree AVB, frequent PACs, anteroseptal infarct (age undetermined).       She had an echo at [facility] on 09/04/10 that showed mild LVH, normal LV systolic function, EF 55% to 65%, normal wall motion, mild AS, mild to moderate AR, aortic valve area: 1.55 cm 2 (VTI), aortic valve area: 1.67 cm 2 (Vmax), LA mildly dilated, no ASD or PFO noted.  Mild TR, trivial MR/PR.   Her last stress test was on 10/02/06 and showed normal perfusion, but she developed chest pain.  She subsequently had a cardiac cath on 11/05/06 (Dr. Amil Amen; see CV Proc tab) that showed widely patent/normal coronary arteries, intact LV size and global systolic function.  Medical therapy was recommended.  EKG on 03/20/12 showed aflutter with variable AV block, LAD, anterior infarct (age undetermined).  Although she reported a history of PAF, she had not had any recent afib/flutter or EKG.  She denies chest pain, SOB at rest, palpitations, presyncope, or significant LE edema.  Exam showed a pleasant caucasian female in no acute distress.  No conversational dyspnea.  Heart RRR, no significant murmur auscultated.  No bruits noted.  Lungs clear.  No LE edema.  Due to finding of aflutter on EKG (and no available [facility] records at that time of patient's PAT appointment), I called and spoke with Dr. Hoyle Barr nurse [name].  I notified her of patient's planned procedure.  She will have Dr. Eldridge Dace review to determine if he feels patient  should be seen preoperatively or if any new recommendations.  She is not on anticoagulation therapy other than ASA which is to be stopped 5-7 days preoperatively.  I told Ms. Lorenzo to contact EMS if any new chest pain, progressive SOB, etc.  Erie Noe at Dr. Yetta Barre' office updated.    CXR on 01/24/12 [facility]) showed no acute cardiopulmonary abnormalities.  Labs noted.  I'll follow-up once I get additional input from Dr. Eldridge Dace.  Shonna Chock, PA-C 03/20/12 1745  Addendum: 03/30/12 0945 Dr. Eldridge Dace has reviewed Ms. Muska's pre-operative EKG showing atrial flutter and faxed over a note with his recommendations (under Cardiology tab in her physical chart).  She has not wanted Coumadin therapy in the past.  Dr. Eldridge Dace would like her to continue bisoprolol in the perioperative period.  As long as she remains asymptomatic, he did not feel that she would need any further cardiac work-up before surgery.  Currently, she is rate controlled, but he states that Anesthesia may need IV B-blockers to control her heart rate if that becomes an issue.

## 2012-03-20 NOTE — Pre-Procedure Instructions (Addendum)
20 Caroline Myers  03/20/2012   Your procedure is scheduled on:  10/2/.13  Report to [facility] at 1015 AM.  Call this number if you have problems the morning of surgery: [phone]   Remember:   Do not eat food or drink:After Midnight.    Take these medicines the morning of surgery with A SIP OF WATER:bisoprolol, zoloft, tramadol  STOP aspirin any nsaids, herbal meds, ,blood thinners 03/26/12   Do not wear jewelry, make-up or nail polish.  Do not wear lotions, powders, or perfumes. You may wear deodorant.  Do not shave 48 hours prior to surgery. Men may shave face and neck.  Do not bring valuables to the hospital.  Contacts, dentures or bridgework may not be worn into surgery.  Leave suitcase in the car. After surgery it may be brought to your room.  For patients admitted to the hospital, checkout time is 11:00 AM the day of discharge.   Patients discharged the day of surgery will not be allowed to drive home.  Name and phone number of your driver:robert spouse  [phone]  Special Instructions: read instruction sheet given to you at preadmit   Please read over the following fact sheets that you were given: Pain Booklet, Coughing and Deep Breathing, MRSA Information and Surgical Site Infection Prevention

## 2012-03-20 NOTE — Progress Notes (Addendum)
req'd notes, any tests from dr Eldridge Dace, also cxr from dr stoneking Orders dated 8/13 in epic. New orders to be entered. Spoke with vanessa at office. Have patient sign permit day of surgery. Revonda Standard pa called to review ekg and see patient if necessary.

## 2012-03-31 MED ORDER — CEFAZOLIN SODIUM-DEXTROSE 2-3 GM-% IV SOLR
2.0000 g | INTRAVENOUS | Status: AC
  Administered 2012-04-01: 2 g via INTRAVENOUS
  Filled 2012-03-31 (×2): qty 50

## 2012-04-01 ENCOUNTER — Encounter (HOSPITAL_COMMUNITY): Payer: Self-pay | Admitting: *Deleted

## 2012-04-01 ENCOUNTER — Inpatient Hospital Stay (HOSPITAL_COMMUNITY): Payer: Medicare Other

## 2012-04-01 ENCOUNTER — Encounter (HOSPITAL_COMMUNITY): Payer: Self-pay | Admitting: Neurological Surgery

## 2012-04-01 ENCOUNTER — Encounter (HOSPITAL_COMMUNITY): Payer: Self-pay | Admitting: Vascular Surgery

## 2012-04-01 ENCOUNTER — Inpatient Hospital Stay (HOSPITAL_COMMUNITY): Payer: Medicare Other | Admitting: Vascular Surgery

## 2012-04-01 ENCOUNTER — Encounter (HOSPITAL_COMMUNITY)
Admission: RE | Disposition: A | Discharge status: Home / Self Care | Payer: Self-pay | Source: Ambulatory Visit | Attending: Neurological Surgery

## 2012-04-01 ENCOUNTER — Inpatient Hospital Stay (HOSPITAL_COMMUNITY)
Admission: RE | Admit: 2012-04-01 | Discharge: 2012-04-02 | DRG: 491 | Disposition: A | Discharge status: Home / Self Care | Payer: Medicare Other | Source: Ambulatory Visit | Attending: Neurological Surgery | Admitting: Neurological Surgery

## 2012-04-01 DIAGNOSIS — M549 Dorsalgia, unspecified: Secondary | ICD-10-CM | POA: Diagnosis not present

## 2012-04-01 DIAGNOSIS — I4891 Unspecified atrial fibrillation: Secondary | ICD-10-CM | POA: Diagnosis not present

## 2012-04-01 DIAGNOSIS — R0602 Shortness of breath: Secondary | ICD-10-CM | POA: Diagnosis not present

## 2012-04-01 DIAGNOSIS — M5126 Other intervertebral disc displacement, lumbar region: Secondary | ICD-10-CM | POA: Diagnosis not present

## 2012-04-01 DIAGNOSIS — K219 Gastro-esophageal reflux disease without esophagitis: Secondary | ICD-10-CM | POA: Diagnosis not present

## 2012-04-01 DIAGNOSIS — Q762 Congenital spondylolisthesis: Secondary | ICD-10-CM | POA: Diagnosis not present

## 2012-04-01 DIAGNOSIS — IMO0002 Reserved for concepts with insufficient information to code with codable children: Secondary | ICD-10-CM | POA: Diagnosis not present

## 2012-04-01 DIAGNOSIS — M48061 Spinal stenosis, lumbar region without neurogenic claudication: Secondary | ICD-10-CM | POA: Diagnosis present

## 2012-04-01 DIAGNOSIS — M545 Low back pain: Secondary | ICD-10-CM | POA: Diagnosis not present

## 2012-04-01 DIAGNOSIS — Z9889 Other specified postprocedural states: Secondary | ICD-10-CM

## 2012-04-01 HISTORY — PX: LUMBAR LAMINECTOMY/DECOMPRESSION MICRODISCECTOMY: SHX5026

## 2012-04-01 SURGERY — LUMBAR LAMINECTOMY/DECOMPRESSION MICRODISCECTOMY 1 LEVEL
Anesthesia: General | Site: Back | Laterality: Right | Wound class: Clean

## 2012-04-01 MED ORDER — ONDANSETRON HCL 4 MG/2ML IJ SOLN
INTRAMUSCULAR | Status: DC | PRN
  Administered 2012-04-01: 4 mg via INTRAVENOUS

## 2012-04-01 MED ORDER — SERTRALINE HCL 25 MG PO TABS
37.5000 mg | ORAL_TABLET | Freq: Every morning | ORAL | Status: DC
  Filled 2012-04-01: qty 1.5

## 2012-04-01 MED ORDER — BUPIVACAINE HCL (PF) 0.25 % IJ SOLN
INTRAMUSCULAR | Status: DC | PRN
  Administered 2012-04-01: 13 mL

## 2012-04-01 MED ORDER — SODIUM CHLORIDE 0.9 % IR SOLN
Status: DC | PRN
  Administered 2012-04-01: 09:00:00

## 2012-04-01 MED ORDER — LIDOCAINE HCL (CARDIAC) 20 MG/ML IV SOLN
INTRAVENOUS | Status: DC | PRN
  Administered 2012-04-01: 50 mg via INTRAVENOUS

## 2012-04-01 MED ORDER — DEXAMETHASONE SODIUM PHOSPHATE 10 MG/ML IJ SOLN
10.0000 mg | INTRAMUSCULAR | Status: AC
  Administered 2012-04-01: 10 mg via INTRAVENOUS
  Filled 2012-04-01: qty 1

## 2012-04-01 MED ORDER — ASPIRIN EC 81 MG PO TBEC
81.0000 mg | DELAYED_RELEASE_TABLET | Freq: Every morning | ORAL | Status: DC
  Administered 2012-04-01: 81 mg via ORAL
  Filled 2012-04-01 (×2): qty 1

## 2012-04-01 MED ORDER — TRAMADOL HCL 50 MG PO TABS
50.0000 mg | ORAL_TABLET | Freq: Every morning | ORAL | Status: DC
  Administered 2012-04-01: 50 mg via ORAL
  Filled 2012-04-01 (×2): qty 1

## 2012-04-01 MED ORDER — HYDROMORPHONE HCL PF 1 MG/ML IJ SOLN: 0.2500 mg | INTRAMUSCULAR | Status: DC | PRN

## 2012-04-01 MED ORDER — FENTANYL CITRATE 0.05 MG/ML IJ SOLN
INTRAMUSCULAR | Status: DC | PRN
  Administered 2012-04-01: 100 ug via INTRAVENOUS

## 2012-04-01 MED ORDER — MENTHOL 3 MG MT LOZG
1.0000 | LOZENGE | OROMUCOSAL | Status: DC | PRN
  Filled 2012-04-01: qty 9

## 2012-04-01 MED ORDER — ACETAMINOPHEN 650 MG RE SUPP: 650.0000 mg | RECTAL | Status: DC | PRN

## 2012-04-01 MED ORDER — DEXAMETHASONE 4 MG PO TABS
4.0000 mg | ORAL_TABLET | Freq: Four times a day (QID) | ORAL | Status: DC
  Administered 2012-04-01 – 2012-04-02 (×3): 4 mg via ORAL
  Filled 2012-04-01 (×7): qty 1

## 2012-04-01 MED ORDER — ROCURONIUM BROMIDE 100 MG/10ML IV SOLN
INTRAVENOUS | Status: DC | PRN
  Administered 2012-04-01: 40 mg via INTRAVENOUS

## 2012-04-01 MED ORDER — PROPOFOL 10 MG/ML IV BOLUS
INTRAVENOUS | Status: DC | PRN
  Administered 2012-04-01: 160 mg via INTRAVENOUS

## 2012-04-01 MED ORDER — ONDANSETRON HCL 4 MG/2ML IJ SOLN: 4.0000 mg | INTRAMUSCULAR | Status: DC | PRN

## 2012-04-01 MED ORDER — METHYLPREDNISOLONE ACETATE 80 MG/ML IJ SUSP
INTRAMUSCULAR | Status: DC | PRN
  Administered 2012-04-01: 80 mg

## 2012-04-01 MED ORDER — DEXAMETHASONE SODIUM PHOSPHATE 10 MG/ML IJ SOLN
INTRAMUSCULAR | Status: AC
  Filled 2012-04-01: qty 1

## 2012-04-01 MED ORDER — HEMOSTATIC AGENTS (NO CHARGE) OPTIME
TOPICAL | Status: DC | PRN
  Administered 2012-04-01: 1 via TOPICAL

## 2012-04-01 MED ORDER — SODIUM CHLORIDE 0.9 % IV SOLN
INTRAVENOUS | Status: AC
  Filled 2012-04-01: qty 500

## 2012-04-01 MED ORDER — ACETAMINOPHEN 325 MG PO TABS
650.0000 mg | ORAL_TABLET | ORAL | Status: DC | PRN
  Administered 2012-04-01: 650 mg via ORAL
  Filled 2012-04-01: qty 2

## 2012-04-01 MED ORDER — PHENYLEPHRINE HCL 10 MG/ML IJ SOLN
INTRAMUSCULAR | Status: DC | PRN
  Administered 2012-04-01 (×2): 80 ug via INTRAVENOUS
  Administered 2012-04-01: 40 ug via INTRAVENOUS
  Administered 2012-04-01 (×2): 80 ug via INTRAVENOUS

## 2012-04-01 MED ORDER — POTASSIUM CHLORIDE IN NACL 20-0.9 MEQ/L-% IV SOLN
INTRAVENOUS | Status: DC
  Filled 2012-04-01 (×3): qty 1000

## 2012-04-01 MED ORDER — SODIUM CHLORIDE 0.9 % IJ SOLN
3.0000 mL | Freq: Two times a day (BID) | INTRAMUSCULAR | Status: DC
  Administered 2012-04-01: 3 mL via INTRAVENOUS

## 2012-04-01 MED ORDER — SODIUM CHLORIDE 0.9 % IV SOLN: 250.0000 mL | INTRAVENOUS | Status: DC

## 2012-04-01 MED ORDER — PHENOL 1.4 % MT LIQD
1.0000 | OROMUCOSAL | Status: DC | PRN
  Administered 2012-04-01: 1 via OROMUCOSAL
  Filled 2012-04-01: qty 177

## 2012-04-01 MED ORDER — BACITRACIN 50000 UNITS IM SOLR
INTRAMUSCULAR | Status: AC
  Filled 2012-04-01: qty 1

## 2012-04-01 MED ORDER — FENTANYL CITRATE 0.05 MG/ML IJ SOLN
INTRAMUSCULAR | Status: AC
  Filled 2012-04-01: qty 2

## 2012-04-01 MED ORDER — MORPHINE SULFATE 2 MG/ML IJ SOLN: 1.0000 mg | INTRAMUSCULAR | Status: DC | PRN

## 2012-04-01 MED ORDER — SENNA 8.6 MG PO TABS
1.0000 | ORAL_TABLET | Freq: Two times a day (BID) | ORAL | Status: DC
  Administered 2012-04-01 (×2): 8.6 mg via ORAL
  Filled 2012-04-01 (×4): qty 1

## 2012-04-01 MED ORDER — BISOPROLOL FUMARATE 5 MG PO TABS
2.5000 mg | ORAL_TABLET | Freq: Every morning | ORAL | Status: DC
  Filled 2012-04-01: qty 0.5

## 2012-04-01 MED ORDER — NEOSTIGMINE METHYLSULFATE 1 MG/ML IJ SOLN
INTRAMUSCULAR | Status: DC | PRN
  Administered 2012-04-01: 4 mg via INTRAVENOUS

## 2012-04-01 MED ORDER — EPHEDRINE SULFATE 50 MG/ML IJ SOLN
INTRAMUSCULAR | Status: DC | PRN
  Administered 2012-04-01 (×5): 10 mg via INTRAVENOUS

## 2012-04-01 MED ORDER — THROMBIN 5000 UNITS EX KIT
PACK | CUTANEOUS | Status: DC | PRN
  Administered 2012-04-01 (×3): 5000 [IU] via TOPICAL

## 2012-04-01 MED ORDER — CEFAZOLIN SODIUM 1-5 GM-% IV SOLN
1.0000 g | Freq: Three times a day (TID) | INTRAVENOUS | Status: AC
  Administered 2012-04-01 (×2): 1 g via INTRAVENOUS
  Filled 2012-04-01 (×2): qty 50

## 2012-04-01 MED ORDER — 0.9 % SODIUM CHLORIDE (POUR BTL) OPTIME
TOPICAL | Status: DC | PRN
  Administered 2012-04-01: 1000 mL

## 2012-04-01 MED ORDER — GLYCOPYRROLATE 0.2 MG/ML IJ SOLN
INTRAMUSCULAR | Status: DC | PRN
  Administered 2012-04-01: 0.6 mg via INTRAVENOUS

## 2012-04-01 MED ORDER — ARTIFICIAL TEARS OP OINT
TOPICAL_OINTMENT | OPHTHALMIC | Status: DC | PRN
  Administered 2012-04-01: 1 via OPHTHALMIC

## 2012-04-01 MED ORDER — FENTANYL CITRATE 0.05 MG/ML IJ SOLN
INTRAMUSCULAR | Status: DC | PRN
  Administered 2012-04-01 (×2): 50 ug via INTRAVENOUS

## 2012-04-01 MED ORDER — DEXAMETHASONE SODIUM PHOSPHATE 4 MG/ML IJ SOLN
4.0000 mg | Freq: Four times a day (QID) | INTRAMUSCULAR | Status: DC
  Administered 2012-04-01: 4 mg via INTRAVENOUS
  Filled 2012-04-01 (×5): qty 1

## 2012-04-01 MED ORDER — METOCLOPRAMIDE HCL 5 MG/ML IJ SOLN: 10.0000 mg | Freq: Once | INTRAMUSCULAR | Status: DC | PRN

## 2012-04-01 MED ORDER — SODIUM CHLORIDE 0.9 % IJ SOLN: 3.0000 mL | INTRAMUSCULAR | Status: DC | PRN

## 2012-04-01 MED ORDER — LACTATED RINGERS IV SOLN
INTRAVENOUS | Status: DC | PRN
  Administered 2012-04-01 (×2): via INTRAVENOUS

## 2012-04-01 SURGICAL SUPPLY — 56 items
ADH SKN CLS APL DERMABOND .7 (GAUZE/BANDAGES/DRESSINGS) ×1
ADH SKN CLS LQ APL DERMABOND (GAUZE/BANDAGES/DRESSINGS) ×1
APL SKNCLS STERI-STRIP NONHPOA (GAUZE/BANDAGES/DRESSINGS) ×1
BAG DECANTER FOR FLEXI CONT (MISCELLANEOUS) ×2 IMPLANT
BENZOIN TINCTURE PRP APPL 2/3 (GAUZE/BANDAGES/DRESSINGS) ×2 IMPLANT
BUR MATCHSTICK NEURO 3.0 LAGG (BURR) ×2 IMPLANT
CANISTER SUCTION 2500CC (MISCELLANEOUS) ×2 IMPLANT
CLOTH BEACON ORANGE TIMEOUT ST (SAFETY) ×2 IMPLANT
CONT SPEC 4OZ CLIKSEAL STRL BL (MISCELLANEOUS) ×2 IMPLANT
DERMABOND ADHESIVE PROPEN (GAUZE/BANDAGES/DRESSINGS) ×1
DERMABOND ADVANCED (GAUZE/BANDAGES/DRESSINGS) ×1
DERMABOND ADVANCED .7 DNX12 (GAUZE/BANDAGES/DRESSINGS) IMPLANT
DERMABOND ADVANCED .7 DNX6 (GAUZE/BANDAGES/DRESSINGS) IMPLANT
DRAPE LAPAROTOMY 100X72X124 (DRAPES) ×2 IMPLANT
DRAPE MICROSCOPE ZEISS OPMI (DRAPES) ×2 IMPLANT
DRAPE POUCH INSTRU U-SHP 10X18 (DRAPES) ×2 IMPLANT
DRAPE SURG 17X23 STRL (DRAPES) ×2 IMPLANT
DRESSING TELFA 8X3 (GAUZE/BANDAGES/DRESSINGS) ×2 IMPLANT
DRSG OPSITE 4X5.5 SM (GAUZE/BANDAGES/DRESSINGS) ×2 IMPLANT
DURAPREP 26ML APPLICATOR (WOUND CARE) ×2 IMPLANT
ELECT REM PT RETURN 9FT ADLT (ELECTROSURGICAL) ×2
ELECTRODE REM PT RTRN 9FT ADLT (ELECTROSURGICAL) ×1 IMPLANT
GAUZE SPONGE 4X4 16PLY XRAY LF (GAUZE/BANDAGES/DRESSINGS) IMPLANT
GLOVE BIO SURGEON STRL SZ8 (GLOVE) ×3 IMPLANT
GLOVE BIOGEL PI IND STRL 8.5 (GLOVE) IMPLANT
GLOVE BIOGEL PI INDICATOR 8.5 (GLOVE) ×1
GLOVE INDICATOR 8.5 STRL (GLOVE) ×1 IMPLANT
GLOVE SURG SS PI 8.0 STRL IVOR (GLOVE) ×2 IMPLANT
GOWN BRE IMP SLV AUR LG STRL (GOWN DISPOSABLE) IMPLANT
GOWN BRE IMP SLV AUR XL STRL (GOWN DISPOSABLE) ×4 IMPLANT
GOWN STRL REIN 2XL LVL4 (GOWN DISPOSABLE) IMPLANT
HEMOSTAT POWDER KIT SURGIFOAM (HEMOSTASIS) ×1 IMPLANT
KIT BASIN OR (CUSTOM PROCEDURE TRAY) ×2 IMPLANT
KIT ROOM TURNOVER OR (KITS) ×2 IMPLANT
NDL HYPO 18GX1.5 BLUNT FILL (NEEDLE) IMPLANT
NDL HYPO 25X1 1.5 SAFETY (NEEDLE) ×1 IMPLANT
NDL SPNL 20GX3.5 QUINCKE YW (NEEDLE) IMPLANT
NEEDLE HYPO 18GX1.5 BLUNT FILL (NEEDLE) ×2 IMPLANT
NEEDLE HYPO 25X1 1.5 SAFETY (NEEDLE) ×2 IMPLANT
NEEDLE SPNL 20GX3.5 QUINCKE YW (NEEDLE) IMPLANT
NEEDLE SPNL 22GX3.5 QUINCKE BK (NEEDLE) ×2 IMPLANT
NS IRRIG 1000ML POUR BTL (IV SOLUTION) ×2 IMPLANT
PACK LAMINECTOMY NEURO (CUSTOM PROCEDURE TRAY) ×2 IMPLANT
PAD ARMBOARD 7.5X6 YLW CONV (MISCELLANEOUS) ×6 IMPLANT
RUBBERBAND STERILE (MISCELLANEOUS) ×4 IMPLANT
SPONGE SURGIFOAM ABS GEL SZ50 (HEMOSTASIS) ×2 IMPLANT
STRIP CLOSURE SKIN 1/2X4 (GAUZE/BANDAGES/DRESSINGS) ×2 IMPLANT
SUT VIC AB 0 CT1 18XCR BRD8 (SUTURE) ×1 IMPLANT
SUT VIC AB 0 CT1 8-18 (SUTURE) ×2
SUT VIC AB 2-0 CP2 18 (SUTURE) ×2 IMPLANT
SUT VIC AB 3-0 SH 8-18 (SUTURE) ×2 IMPLANT
SYR 20ML ECCENTRIC (SYRINGE) ×2 IMPLANT
SYR 5ML LL (SYRINGE) ×1 IMPLANT
TOWEL OR 17X24 6PK STRL BLUE (TOWEL DISPOSABLE) ×2 IMPLANT
TOWEL OR 17X26 10 PK STRL BLUE (TOWEL DISPOSABLE) ×2 IMPLANT
WATER STERILE IRR 1000ML POUR (IV SOLUTION) ×2 IMPLANT

## 2012-04-01 NOTE — Op Note (Signed)
04/01/2012  10:28 AM  PATIENT:  Caroline Myers  78 y.o. 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 Wynetta Emery  ANESTHESIA:   General  EBL: Less than 50 ml  Total I/O In: 1300 [I.V.:1300] Out: 45 [Blood:45]  BLOOD ADMINISTERED:none  DRAINS: none   SPECIMEN:  No Specimen  INDICATION FOR PROCEDURE: This is an 78 year old female who presented with severe right L4 radicular pain. She tried medical management and injection therapy for several months without relief. She had a mild spondylolisthesis at this level but did not move in flexion-extension. I recommended extraforaminal decompression and discectomy at L4-5 on the right. She had a second opinion is suggested she should undergo lumbar fusion at this level, but I really wanted to try to avoid such a large operation and her at all possible as the risks of that operation would not be insignificant in her. Therefore we decided to proceed with simple extraforaminal decompression and microdiscectomy in the hopes of improving her pain. Patient understood the risks, benefits, and alternatives and potential outcomes and wished to proceed.  PROCEDURE DETAILS: The patient was taken to the operating room and after induction of adequate generalized endotracheal anesthesia, the patient was rolled into the prone position on the Wilson frame and all pressure points were padded. The lumbar region was cleaned and then prepped with DuraPrep and draped in the usual sterile fashion. 5 cc of local anesthesia was injected and then a dorsal midline incision was made and carried down to the lumbo sacral fascia. The fascia was opened and the paraspinous musculature was taken down in a subperiosteal fashion to expose L4-5 on the right.  Intraoperative x-ray confirmed my level, and then I continued my dissection out over the lateral part of the facets of L4-5 and L3-4. Identified the transverse process of L4. There was a large extraforaminal loose osteophyte that was removed . I then used the high-speed drill to drill the superior part of the facet at L4 from the right as well as the lateral pars. I undercut the extraforaminal space the overgrown part of the facet which had grown superiorly to compress the L4 nerve root. The operating microscope was brought into the field. I used it for extra illumination and magnification to open the yellow ligament removed to decompress the L4 nerve root. The disc space was identified and incised and a discectomy was performed utilizing microscopic dissection and pituitary rongeurs.  The nerve root was well decompressed. . I then palpated with a coronary dilator along the nerve root and into the foramen to assure adequate decompression. I felt no more compression of the nerve root. I irrigated with saline solution containing bacitracin. Achieved hemostasis with bipolar cautery, lined the dura with Gelfoam, and then closed the fascia with 0 Vicryl. I closed the subcutaneous tissues with 2-0 Vicryl and the subcuticular tissues with 3-0 Vicryl. The skin was then closed with benzoin and Steri-Strips. The drapes were removed, a sterile dressing was applied. The patient was awakened from general anesthesia and transferred to the recovery room in stable condition. At the end of the procedure all sponge, needle and instrument counts were correct.   PLAN OF CARE: Admit to inpatient   PATIENT DISPOSITION:  PACU - hemodynamically stable.   Delay start of Pharmacological VTE agent (>24hrs) due to surgical blood  loss or risk of bleeding:  yes

## 2012-04-01 NOTE — Anesthesia Preprocedure Evaluation (Addendum)
Anesthesia Evaluation  Patient identified by MRN, date of birth, ID band Patient awake    Reviewed: Allergy & Precautions, H&P , NPO status , Patient's Chart, lab work & pertinent test results, reviewed documented beta blocker date and time   History of Anesthesia Complications Negative for: history of anesthetic complications  Airway Mallampati: II TM Distance: >3 FB Neck ROM: Limited    Dental  (+) Teeth Intact, Dental Advisory Given and Implants,    Pulmonary shortness of breath and with exertion, asthma ,  breath sounds clear to auscultation        Cardiovascular +CHF + dysrhythmias Atrial Fibrillation Rhythm:regular     Neuro/Psych  Headaches, PSYCHIATRIC DISORDERS Anxiety  Neuromuscular disease    GI/Hepatic Neg liver ROS, GERD-  Controlled,  Endo/Other  negative endocrine ROS  Renal/GU negative Renal ROS  negative genitourinary   Musculoskeletal  (+) Arthritis -, Osteoarthritis,    Abdominal   Peds  Hematology negative hematology ROS (+)   Anesthesia Other Findings See surgeon's H&P   Reproductive/Obstetrics negative OB ROS                         Anesthesia Physical Anesthesia Plan  ASA: III  Anesthesia Plan: General   Post-op Pain Management:    Induction: Intravenous  Airway Management Planned: Oral ETT  Additional Equipment:   Intra-op Plan:   Post-operative Plan: Extubation in OR  Informed Consent: I have reviewed the patients History and Physical, chart, labs and discussed the procedure including the risks, benefits and alternatives for the proposed anesthesia with the patient or authorized representative who has indicated his/her understanding and acceptance.   Dental Advisory Given  Plan Discussed with: CRNA and Surgeon  Anesthesia Plan Comments:         Anesthesia Quick Evaluation

## 2012-04-01 NOTE — Anesthesia Postprocedure Evaluation (Signed)
Anesthesia Post Note  Patient: Caroline Myers  Procedure(s) Performed: Procedure(s) (LRB): LUMBAR LAMINECTOMY/DECOMPRESSION MICRODISCECTOMY 1 LEVEL (Right)  Anesthesia type: general  Patient location: PACU  Post pain: Pain level controlled  Post assessment: Patient's Cardiovascular Status Stable  Last Vitals:  Filed Vitals:   04/01/12 1034  BP: 133/91  Pulse:   Temp: 36.1 C  Resp: 16    Post vital signs: Reviewed and stable  Level of consciousness: sedated  Complications: No apparent anesthesia complications

## 2012-04-01 NOTE — H&P (Signed)
Subjective: Patient is a 78 y.o. female admitted for Extraforaminal microdiskectomy. Onset of symptoms was several months ago, gradually worsening since that time.  The pain is rated severe, and is located at the across the lower back and radiates to RLE. The pain is described as aching and throbbing and occurs all day. The symptoms have been progressive. Symptoms are exacerbated by exercise. MRI or CT showed R L4-5 foraminal stenosis.  Past Medical History  Diagnosis Date  . Allergy     allergic rhinitis  . Cancer     history of skin cancer of the nose  . Asthma 2000    mild. tried Advair after bronchospasm after exposure to cats  . Shoulder pain, left 05/2010    with rotator cuff tear--Dr Caroline Myers  . Paroxysmal atrial fibrillation     pt does not want anticoagulation  . Aortic stenosis     EF normal.  Mild aortic stenosis. Mild to moderate aortic regurgitation  . Anxiety   . Shortness of breath     occ  . GERD (gastroesophageal reflux disease)     occ  . Arthritis     Past Surgical History  Procedure Date  . Hernia repair 2003  . Acromioplasty 2000    right shoulder, arthroscopic    Prior to Admission medications   Medication Sig Start Date End Date Taking? Authorizing Provider  acetaminophen (TYLENOL) 325 MG tablet Take 325-650 mg by mouth every 6 (six) hours as needed. For pain   Yes Historical Provider, MD  aspirin EC 81 MG tablet Take 81 mg by mouth every morning.   Yes Historical Provider, MD  bisoprolol (ZEBETA) 5 MG tablet Take 2.5 mg by mouth every morning.   Yes Historical Provider, MD  sertraline (ZOLOFT) 25 MG tablet Take 37.5 mg by mouth every morning.   Yes Historical Provider, MD  traMADol (ULTRAM) 50 MG tablet Take 50 mg by mouth every morning.   Yes Historical Provider, MD   Allergies  Allergen Reactions  . Tape Other (See Comments)    Rips skin    History  Substance Use Topics  . Smoking status: Never Smoker   . Smokeless tobacco: Never Used   Comment: occ wine  . Alcohol Use: Yes    History reviewed. No pertinent family history.   Review of Systems  Positive ROS: neg  All other systems have been reviewed and were otherwise negative with the exception of those mentioned in the HPI and as above.  Objective: Vital signs in last 24 hours: Temp:  [97.8 F (36.6 C)] 97.8 F (36.6 C) (10/02 0703) Pulse Rate:  [76] 76  (10/02 0703) Resp:  [18] 18  (10/02 0703) SpO2:  [97 %] 97 % (10/02 0703)  General Appearance: Alert, cooperative, no distress, appears stated age Head: Normocephalic, without obvious abnormality, atraumatic Eyes: PERRL, conjunctiva/corneas clear, EOM's intact, fundi benign, both eyes      Ears: Normal TM's and external ear canals, both ears Throat: Lips, mucosa, and tongue normal; teeth and gums normal Neck: Supple, symmetrical, trachea midline, no adenopathy; thyroid: No enlargement/tenderness/nodules; no carotid bruit or JVD Back: Symmetric, no curvature, ROM normal, no CVA tenderness Lungs: Clear to auscultation bilaterally, respirations unlabored Heart: Regular rate and rhythm, S1 and S2 normal, no murmur, rub or gallop Abdomen: Soft, non-tender, bowel sounds active all four quadrants, no masses, no organomegaly Extremities: Extremities normal, atraumatic, no cyanosis or edema Pulses: 2+ and symmetric all extremities Skin: Skin color, texture, turgor normal, no rashes or  lesions  NEUROLOGIC:   Mental status: Alert and oriented x4,  no aphasia, good attention span, fund of knowledge, and memory Motor Exam - grossly normal Sensory Exam - grossly normal Reflexes: 1+ Coordination - grossly normal Gait - grossly normal Balance - grossly normal Cranial Nerves: I: smell Not tested  II: visual acuity  OS: nl    OD: nl  II: visual fields Full to confrontation  II: pupils Equal, round, reactive to light  III,VII: ptosis None  III,IV,VI: extraocular muscles  Full ROM  V: mastication Normal  V: facial  light touch sensation  Normal  V,VII: corneal reflex  Present  VII: facial muscle function - upper  Normal  VII: facial muscle function - lower Normal  VIII: hearing Not tested  IX: soft palate elevation  Normal  IX,X: gag reflex Present  XI: trapezius strength  5/5  XI: sternocleidomastoid strength 5/5  XI: neck flexion strength  5/5  XII: tongue strength  Normal    Data Review Lab Results  Component Value Date   WBC 9.2 03/20/2012   HGB 14.6 03/20/2012   HCT 43.7 03/20/2012   MCV 98.6 03/20/2012   PLT 265 03/20/2012   Lab Results  Component Value Date   NA 139 03/20/2012   K 4.6 03/20/2012   CL 102 03/20/2012   CO2 29 03/20/2012   BUN 24* 03/20/2012   CREATININE 0.80 03/20/2012   GLUCOSE 102* 03/20/2012   Lab Results  Component Value Date   INR 1.01 03/20/2012    Assessment/Plan: Patient admitted for R extraforaminal stenosis and r leg pain. Patient Myers failed conservative therapy.  I explained the condition and procedure (R L4-5 extraforaminal decompression) to the patient and answered any questions.  Patient wishes to proceed with procedure as planned. Understands risks/ benefits and typical outcomes of procedure.   Caroline Myers S 04/01/2012 8:30 AM

## 2012-04-01 NOTE — Transfer of Care (Signed)
Immediate Anesthesia Transfer of Care Note  Patient: Caroline Myers  Procedure(s) Performed: Procedure(s) (LRB) with comments: LUMBAR LAMINECTOMY/DECOMPRESSION MICRODISCECTOMY 1 LEVEL (Right) - Right Lumbar four-five extraforaminal microdiskectomy   Patient Location: PACU  Anesthesia Type: General  Level of Consciousness: awake, alert  and oriented  Airway & Oxygen Therapy: Patient Spontanous Breathing and Patient connected to nasal cannula oxygen  Post-op Assessment: Report given to PACU RN and Post -op Vital signs reviewed and stable  Post vital signs: Reviewed and stable  Complications: No apparent anesthesia complications

## 2012-04-01 NOTE — Preoperative (Signed)
Beta Blockers   Reason not to administer Beta Blockers:Not Applicable 

## 2012-04-02 ENCOUNTER — Encounter (HOSPITAL_COMMUNITY): Payer: Self-pay | Admitting: Neurological Surgery

## 2012-04-02 MED ORDER — TRAMADOL HCL 50 MG PO TABS: 50.0000 mg | ORAL_TABLET | Freq: Four times a day (QID) | ORAL | Status: DC | PRN

## 2012-04-02 NOTE — Discharge Summary (Signed)
Physician Discharge Summary  Patient ID: Caroline Myers MRN: [id number] DOB/AGE: 76-Apr-1925 76 y.o.  Admit date: 04/01/2012 Discharge date: 04/02/2012  Admission Diagnoses: lumbar radiculopathy    Discharge Diagnoses: same   Discharged Condition: good  Hospital Course: The patient was admitted on 04/01/2012 and taken to the operating room where the patient underwent lumbar microdiskectomy. The patient tolerated the procedure well and was taken to the recovery room and then to the floor in stable condition. The hospital course was routine. There were no complications. The wound remained clean dry and intact. Pt had appropriate back soreness. No complaints of leg pain or new N/T/W. The patient remained afebrile with stable vital signs, and tolerated a regular diet. The patient continued to increase activities, and pain was well controlled with oral pain medications.   Consults: None  Significant Diagnostic Studies:  Results for orders placed during the hospital encounter of 03/20/12  BASIC METABOLIC PANEL      Component Value Range   Sodium 139  135 - 145 mEq/L   Potassium 4.6  3.5 - 5.1 mEq/L   Chloride 102  96 - 112 mEq/L   CO2 29  19 - 32 mEq/L   Glucose, Bld 102 (*) 70 - 99 mg/dL   BUN 24 (*) 6 - 23 mg/dL   Creatinine, Ser 4.09  0.50 - 1.10 mg/dL   Calcium 9.4  8.4 - 81.1 mg/dL   GFR calc non Af Amer 65 (*) >90 mL/min   GFR calc Af Amer 76 (*) >90 mL/min  CBC WITH DIFFERENTIAL      Component Value Range   WBC 9.2  4.0 - 10.5 K/uL   RBC 4.43  3.87 - 5.11 MIL/uL   Hemoglobin 14.6  12.0 - 15.0 g/dL   HCT 91.4  78.2 - 95.6 %   MCV 98.6  78.0 - 100.0 fL   MCH 33.0  26.0 - 34.0 pg   MCHC 33.4  30.0 - 36.0 g/dL   RDW 21.3  08.6 - 57.8 %   Platelets 265  150 - 400 K/uL   Neutrophils Relative 70  43 - 77 %   Neutro Abs 6.4  1.7 - 7.7 K/uL   Lymphocytes Relative 13  12 - 46 %   Lymphs Abs 1.2  0.7 - 4.0 K/uL   Monocytes Relative 12  3 - 12 %   Monocytes Absolute 1.1 (*) 0.1  - 1.0 K/uL   Eosinophils Relative 4  0 - 5 %   Eosinophils Absolute 0.4  0.0 - 0.7 K/uL   Basophils Relative 1  0 - 1 %   Basophils Absolute 0.1  0.0 - 0.1 K/uL  PROTIME-INR      Component Value Range   Prothrombin Time 13.2  11.6 - 15.2 seconds   INR 1.01  0.00 - 1.49  SURGICAL PCR SCREEN      Component Value Range   MRSA, PCR NEGATIVE  NEGATIVE   Staphylococcus aureus NEGATIVE  NEGATIVE    Dg Lumbar Spine 2-3 Views  04/01/2012  *RADIOLOGY REPORT*  Clinical Data: Back pain  LUMBAR SPINE - 2-3 VIEW  Comparison: Multiple priors  Findings: Film #1 demonstrates a needle at L4-L5.  Film #2 demonstrates a slightly angled probe directed toward midportion L4, and right angled probe directed most closely toward L4-5  IMPRESSION:  As above.   Original Report Authenticated By: Elsie Stain, M.D.     Antibiotics:  Anti-infectives     Start  Dose/Rate Route Frequency Ordered Stop   04/01/12 1600   ceFAZolin (ANCEF) IVPB 1 g/50 mL premix        1 g 100 mL/hr over 30 Minutes Intravenous Every 8 hours 04/01/12 1201 04/02/12 0004   04/01/12 0917   bacitracin 50,000 Units in sodium chloride irrigation 0.9 % 500 mL irrigation  Status:  Discontinued          As needed 04/01/12 0918 04/01/12 1030   04/01/12 0819   bacitracin 16109 UNITS injection     Comments: KEY, JENNIFER: cabinet override         04/01/12 0819 04/01/12 2029   04/01/12 0000   ceFAZolin (ANCEF) IVPB 2 g/50 mL premix        2 g 100 mL/hr over 30 Minutes Intravenous 60 min pre-op 03/31/12 1500 04/01/12 0836          Discharge Exam: Blood pressure 99/78, pulse 88, temperature 97.6 F (36.4 C), temperature source Oral, resp. rate 18, SpO2 96.00%. Neurologic: Grossly normal incision CDI  Discharge Medications:     Medication List     As of 04/02/2012  8:03 AM    TAKE these medications         acetaminophen 325 MG tablet   Commonly known as: TYLENOL   Take 325-650 mg by mouth every 6 (six) hours as needed. For  pain      aspirin EC 81 MG tablet   Take 81 mg by mouth every morning.      bisoprolol 5 MG tablet   Commonly known as: ZEBETA   Take 2.5 mg by mouth every morning.      sertraline 25 MG tablet   Commonly known as: ZOLOFT   Take 37.5 mg by mouth every morning.      traMADol 50 MG tablet   Commonly known as: ULTRAM   Take 1 tablet (50 mg total) by mouth every 6 (six) hours as needed for pain.        Disposition: home  Final Dx: lumbar extraforaminal microdiskectomy L4-5      Discharge Orders    Future Orders Please Complete By Expires   Diet - low sodium heart healthy      Increase activity slowly      Discharge instructions      Comments:   No bending, twisting or lifting Walk twice per day, but take it easy   Call MD for:  temperature >100.4      Call MD for:  persistant nausea and vomiting      Call MD for:  severe uncontrolled pain      Call MD for:  redness, tenderness, or signs of infection (pain, swelling, redness, odor or green/yellow discharge around incision site)      Call MD for:  difficulty breathing, headache or visual disturbances         Follow-up Information    Follow up with Bayley Yarborough S, MD. Schedule an appointment as soon as possible for a visit in 2 weeks.   Contact information:   [address] [place] [address] [phone]           Signed: Tia Alert 04/02/2012, 8:03 AM

## 2012-04-02 NOTE — Plan of Care (Signed)
Problem: Consults Goal: Diagnosis - Spinal Surgery Outcome: Completed/Met Date Met:  04/02/12 Microdiscectomy     

## 2012-04-18 DIAGNOSIS — H1045 Other chronic allergic conjunctivitis: Secondary | ICD-10-CM | POA: Diagnosis not present

## 2012-05-01 DIAGNOSIS — H0019 Chalazion unspecified eye, unspecified eyelid: Secondary | ICD-10-CM | POA: Diagnosis not present

## 2012-05-14 DIAGNOSIS — Z23 Encounter for immunization: Secondary | ICD-10-CM | POA: Diagnosis not present

## 2012-06-16 DIAGNOSIS — I4891 Unspecified atrial fibrillation: Secondary | ICD-10-CM | POA: Diagnosis not present

## 2012-06-16 DIAGNOSIS — F411 Generalized anxiety disorder: Secondary | ICD-10-CM | POA: Diagnosis not present

## 2012-07-02 DIAGNOSIS — H00019 Hordeolum externum unspecified eye, unspecified eyelid: Secondary | ICD-10-CM | POA: Diagnosis not present

## 2012-07-09 DIAGNOSIS — R1310 Dysphagia, unspecified: Secondary | ICD-10-CM | POA: Diagnosis not present

## 2012-07-16 ENCOUNTER — Other Ambulatory Visit: Payer: Self-pay | Admitting: Gastroenterology

## 2012-07-16 DIAGNOSIS — R1310 Dysphagia, unspecified: Secondary | ICD-10-CM

## 2012-07-20 DIAGNOSIS — IMO0002 Reserved for concepts with insufficient information to code with codable children: Secondary | ICD-10-CM | POA: Diagnosis not present

## 2012-07-21 ENCOUNTER — Ambulatory Visit
Admission: RE | Admit: 2012-07-21 | Discharge: 2012-07-21 | Disposition: A | Discharge status: Home / Self Care | Payer: Medicare Other | Source: Ambulatory Visit | Attending: Gastroenterology | Admitting: Gastroenterology

## 2012-07-21 DIAGNOSIS — R1310 Dysphagia, unspecified: Secondary | ICD-10-CM

## 2012-07-21 DIAGNOSIS — K228 Other specified diseases of esophagus: Secondary | ICD-10-CM | POA: Diagnosis not present

## 2012-07-30 DIAGNOSIS — K224 Dyskinesia of esophagus: Secondary | ICD-10-CM | POA: Diagnosis not present

## 2012-08-04 DIAGNOSIS — H02139 Senile ectropion of unspecified eye, unspecified eyelid: Secondary | ICD-10-CM | POA: Diagnosis not present

## 2012-08-04 DIAGNOSIS — Z961 Presence of intraocular lens: Secondary | ICD-10-CM | POA: Diagnosis not present

## 2012-08-17 DIAGNOSIS — J069 Acute upper respiratory infection, unspecified: Secondary | ICD-10-CM | POA: Diagnosis not present

## 2012-08-19 DIAGNOSIS — R05 Cough: Secondary | ICD-10-CM | POA: Diagnosis not present

## 2012-08-19 DIAGNOSIS — J209 Acute bronchitis, unspecified: Secondary | ICD-10-CM | POA: Diagnosis not present

## 2012-08-24 DIAGNOSIS — J309 Allergic rhinitis, unspecified: Secondary | ICD-10-CM | POA: Diagnosis not present

## 2012-08-24 DIAGNOSIS — J45909 Unspecified asthma, uncomplicated: Secondary | ICD-10-CM | POA: Diagnosis not present

## 2012-09-09 DIAGNOSIS — R42 Dizziness and giddiness: Secondary | ICD-10-CM | POA: Diagnosis not present

## 2012-09-09 DIAGNOSIS — H6980 Other specified disorders of Eustachian tube, unspecified ear: Secondary | ICD-10-CM | POA: Diagnosis not present

## 2012-09-18 DIAGNOSIS — H8110 Benign paroxysmal vertigo, unspecified ear: Secondary | ICD-10-CM | POA: Diagnosis not present

## 2012-10-26 DIAGNOSIS — L821 Other seborrheic keratosis: Secondary | ICD-10-CM | POA: Diagnosis not present

## 2012-10-26 DIAGNOSIS — Z85828 Personal history of other malignant neoplasm of skin: Secondary | ICD-10-CM | POA: Diagnosis not present

## 2012-10-26 DIAGNOSIS — L570 Actinic keratosis: Secondary | ICD-10-CM | POA: Diagnosis not present

## 2012-11-02 DIAGNOSIS — B351 Tinea unguium: Secondary | ICD-10-CM | POA: Diagnosis not present

## 2012-11-02 DIAGNOSIS — M79609 Pain in unspecified limb: Secondary | ICD-10-CM | POA: Diagnosis not present

## 2012-11-02 DIAGNOSIS — M216X9 Other acquired deformities of unspecified foot: Secondary | ICD-10-CM | POA: Diagnosis not present

## 2012-11-03 DIAGNOSIS — M48061 Spinal stenosis, lumbar region without neurogenic claudication: Secondary | ICD-10-CM | POA: Diagnosis not present

## 2012-11-03 DIAGNOSIS — M5430 Sciatica, unspecified side: Secondary | ICD-10-CM | POA: Diagnosis not present

## 2012-11-13 DIAGNOSIS — R1110 Vomiting, unspecified: Secondary | ICD-10-CM | POA: Diagnosis not present

## 2013-01-05 DIAGNOSIS — H52209 Unspecified astigmatism, unspecified eye: Secondary | ICD-10-CM | POA: Diagnosis not present

## 2013-01-05 DIAGNOSIS — Z961 Presence of intraocular lens: Secondary | ICD-10-CM | POA: Diagnosis not present

## 2013-01-05 DIAGNOSIS — H2640 Unspecified secondary cataract: Secondary | ICD-10-CM | POA: Diagnosis not present

## 2013-01-10 DIAGNOSIS — H00019 Hordeolum externum unspecified eye, unspecified eyelid: Secondary | ICD-10-CM | POA: Diagnosis not present

## 2013-01-13 DIAGNOSIS — H00019 Hordeolum externum unspecified eye, unspecified eyelid: Secondary | ICD-10-CM | POA: Diagnosis not present

## 2013-01-22 DIAGNOSIS — Z0000 Encounter for general adult medical examination without abnormal findings: Secondary | ICD-10-CM | POA: Diagnosis not present

## 2013-01-22 DIAGNOSIS — J309 Allergic rhinitis, unspecified: Secondary | ICD-10-CM | POA: Diagnosis not present

## 2013-01-29 DIAGNOSIS — M545 Low back pain, unspecified: Secondary | ICD-10-CM | POA: Diagnosis not present

## 2013-01-29 DIAGNOSIS — M2170 Unequal limb length (acquired), unspecified site: Secondary | ICD-10-CM | POA: Diagnosis not present

## 2013-03-22 DIAGNOSIS — B351 Tinea unguium: Secondary | ICD-10-CM | POA: Diagnosis not present

## 2013-03-22 DIAGNOSIS — M79609 Pain in unspecified limb: Secondary | ICD-10-CM | POA: Diagnosis not present

## 2013-04-05 DIAGNOSIS — H00019 Hordeolum externum unspecified eye, unspecified eyelid: Secondary | ICD-10-CM | POA: Diagnosis not present

## 2013-04-19 DIAGNOSIS — Z23 Encounter for immunization: Secondary | ICD-10-CM | POA: Diagnosis not present

## 2013-04-22 DIAGNOSIS — R0989 Other specified symptoms and signs involving the circulatory and respiratory systems: Secondary | ICD-10-CM | POA: Diagnosis not present

## 2013-04-22 DIAGNOSIS — R0609 Other forms of dyspnea: Secondary | ICD-10-CM | POA: Diagnosis not present

## 2013-04-22 DIAGNOSIS — J45909 Unspecified asthma, uncomplicated: Secondary | ICD-10-CM | POA: Diagnosis not present

## 2013-05-20 DIAGNOSIS — R5381 Other malaise: Secondary | ICD-10-CM | POA: Diagnosis not present

## 2013-05-20 DIAGNOSIS — R5383 Other fatigue: Secondary | ICD-10-CM | POA: Diagnosis not present

## 2013-05-24 DIAGNOSIS — M79609 Pain in unspecified limb: Secondary | ICD-10-CM | POA: Diagnosis not present

## 2013-05-24 DIAGNOSIS — B351 Tinea unguium: Secondary | ICD-10-CM | POA: Diagnosis not present

## 2013-06-01 DIAGNOSIS — M545 Low back pain: Secondary | ICD-10-CM | POA: Diagnosis not present

## 2013-06-03 DIAGNOSIS — M545 Low back pain: Secondary | ICD-10-CM | POA: Diagnosis not present

## 2013-06-08 DIAGNOSIS — M545 Low back pain: Secondary | ICD-10-CM | POA: Diagnosis not present

## 2013-06-10 DIAGNOSIS — M545 Low back pain: Secondary | ICD-10-CM | POA: Diagnosis not present

## 2013-07-19 DIAGNOSIS — J45909 Unspecified asthma, uncomplicated: Secondary | ICD-10-CM | POA: Diagnosis not present

## 2013-07-26 ENCOUNTER — Inpatient Hospital Stay (HOSPITAL_COMMUNITY)
Admission: EM | Admit: 2013-07-26 | Discharge: 2013-07-28 | DRG: 066 | Disposition: A | Discharge status: Home / Self Care | Payer: Medicare Other | Attending: Internal Medicine | Admitting: Internal Medicine

## 2013-07-26 ENCOUNTER — Emergency Department (HOSPITAL_COMMUNITY): Payer: Medicare Other

## 2013-07-26 ENCOUNTER — Encounter (HOSPITAL_COMMUNITY): Payer: Self-pay | Admitting: Emergency Medicine

## 2013-07-26 DIAGNOSIS — R4182 Altered mental status, unspecified: Secondary | ICD-10-CM | POA: Diagnosis not present

## 2013-07-26 DIAGNOSIS — F411 Generalized anxiety disorder: Secondary | ICD-10-CM

## 2013-07-26 DIAGNOSIS — Z7982 Long term (current) use of aspirin: Secondary | ICD-10-CM | POA: Diagnosis not present

## 2013-07-26 DIAGNOSIS — R0609 Other forms of dyspnea: Secondary | ICD-10-CM

## 2013-07-26 DIAGNOSIS — G459 Transient cerebral ischemic attack, unspecified: Secondary | ICD-10-CM

## 2013-07-26 DIAGNOSIS — J45909 Unspecified asthma, uncomplicated: Secondary | ICD-10-CM | POA: Diagnosis present

## 2013-07-26 DIAGNOSIS — K219 Gastro-esophageal reflux disease without esophagitis: Secondary | ICD-10-CM | POA: Diagnosis present

## 2013-07-26 DIAGNOSIS — I6340 Cerebral infarction due to embolism of unspecified cerebral artery: Principal | ICD-10-CM | POA: Diagnosis present

## 2013-07-26 DIAGNOSIS — M5430 Sciatica, unspecified side: Secondary | ICD-10-CM

## 2013-07-26 DIAGNOSIS — M129 Arthropathy, unspecified: Secondary | ICD-10-CM | POA: Diagnosis present

## 2013-07-26 DIAGNOSIS — M25519 Pain in unspecified shoulder: Secondary | ICD-10-CM

## 2013-07-26 DIAGNOSIS — I6350 Cerebral infarction due to unspecified occlusion or stenosis of unspecified cerebral artery: Secondary | ICD-10-CM | POA: Diagnosis not present

## 2013-07-26 DIAGNOSIS — J309 Allergic rhinitis, unspecified: Secondary | ICD-10-CM

## 2013-07-26 DIAGNOSIS — Z8679 Personal history of other diseases of the circulatory system: Secondary | ICD-10-CM

## 2013-07-26 DIAGNOSIS — I359 Nonrheumatic aortic valve disorder, unspecified: Secondary | ICD-10-CM | POA: Diagnosis present

## 2013-07-26 DIAGNOSIS — J40 Bronchitis, not specified as acute or chronic: Secondary | ICD-10-CM

## 2013-07-26 DIAGNOSIS — I4891 Unspecified atrial fibrillation: Secondary | ICD-10-CM

## 2013-07-26 DIAGNOSIS — I639 Cerebral infarction, unspecified: Secondary | ICD-10-CM

## 2013-07-26 DIAGNOSIS — E785 Hyperlipidemia, unspecified: Secondary | ICD-10-CM

## 2013-07-26 DIAGNOSIS — R51 Headache: Secondary | ICD-10-CM

## 2013-07-26 DIAGNOSIS — R079 Chest pain, unspecified: Secondary | ICD-10-CM

## 2013-07-26 DIAGNOSIS — R0989 Other specified symptoms and signs involving the circulatory and respiratory systems: Secondary | ICD-10-CM

## 2013-07-26 DIAGNOSIS — Z85828 Personal history of other malignant neoplasm of skin: Secondary | ICD-10-CM

## 2013-07-26 DIAGNOSIS — Z0000 Encounter for general adult medical examination without abnormal findings: Secondary | ICD-10-CM

## 2013-07-26 DIAGNOSIS — I672 Cerebral atherosclerosis: Secondary | ICD-10-CM | POA: Diagnosis not present

## 2013-07-26 DIAGNOSIS — R05 Cough: Secondary | ICD-10-CM | POA: Diagnosis not present

## 2013-07-26 DIAGNOSIS — I509 Heart failure, unspecified: Secondary | ICD-10-CM

## 2013-07-26 DIAGNOSIS — I4892 Unspecified atrial flutter: Secondary | ICD-10-CM

## 2013-07-26 DIAGNOSIS — R059 Cough, unspecified: Secondary | ICD-10-CM | POA: Diagnosis not present

## 2013-07-26 LAB — POCT I-STAT TROPONIN I: Troponin i, poc: 0.02 ng/mL (ref 0.00–0.08)

## 2013-07-26 LAB — DIFFERENTIAL
BASOS PCT: 1 % (ref 0–1)
Basophils Absolute: 0 10*3/uL (ref 0.0–0.1)
EOS ABS: 0.4 10*3/uL (ref 0.0–0.7)
EOS PCT: 6 % — AB (ref 0–5)
LYMPHS ABS: 1.6 10*3/uL (ref 0.7–4.0)
Lymphocytes Relative: 22 % (ref 12–46)
MONO ABS: 0.9 10*3/uL (ref 0.1–1.0)
MONOS PCT: 12 % (ref 3–12)
Neutro Abs: 4.4 10*3/uL (ref 1.7–7.7)
Neutrophils Relative %: 60 % (ref 43–77)

## 2013-07-26 LAB — CBC
HCT: 40.8 % (ref 36.0–46.0)
HEMOGLOBIN: 13.7 g/dL (ref 12.0–15.0)
MCH: 33.1 pg (ref 26.0–34.0)
MCHC: 33.6 g/dL (ref 30.0–36.0)
MCV: 98.6 fL (ref 78.0–100.0)
Platelets: 229 10*3/uL (ref 150–400)
RBC: 4.14 MIL/uL (ref 3.87–5.11)
RDW: 12.9 % (ref 11.5–15.5)
WBC: 7.3 10*3/uL (ref 4.0–10.5)

## 2013-07-26 LAB — COMPREHENSIVE METABOLIC PANEL
ALBUMIN: 3.4 g/dL — AB (ref 3.5–5.2)
ALK PHOS: 106 U/L (ref 39–117)
ALT: 16 U/L (ref 0–35)
AST: 28 U/L (ref 0–37)
BUN: 25 mg/dL — AB (ref 6–23)
CALCIUM: 9.1 mg/dL (ref 8.4–10.5)
CO2: 26 mEq/L (ref 19–32)
CREATININE: 0.81 mg/dL (ref 0.50–1.10)
Chloride: 97 mEq/L (ref 96–112)
GFR calc non Af Amer: 63 mL/min — ABNORMAL LOW (ref >90)
GFR, EST AFRICAN AMERICAN: 74 mL/min — AB (ref >90)
GLUCOSE: 88 mg/dL (ref 70–99)
Potassium: 4.8 mEq/L (ref 3.7–5.3)
Sodium: 137 mEq/L (ref 137–147)
TOTAL PROTEIN: 7.4 g/dL (ref 6.0–8.3)
Total Bilirubin: 0.4 mg/dL (ref 0.3–1.2)

## 2013-07-26 LAB — RAPID URINE DRUG SCREEN, HOSP PERFORMED
Amphetamines: NOT DETECTED
BENZODIAZEPINES: NOT DETECTED
Barbiturates: NOT DETECTED
COCAINE: NOT DETECTED
OPIATES: NOT DETECTED
Tetrahydrocannabinol: NOT DETECTED

## 2013-07-26 LAB — POCT I-STAT, CHEM 8
BUN: 31 mg/dL — AB (ref 6–23)
CHLORIDE: 99 meq/L (ref 96–112)
CREATININE: 0.9 mg/dL (ref 0.50–1.10)
Calcium, Ion: 1.13 mmol/L (ref 1.13–1.30)
GLUCOSE: 85 mg/dL (ref 70–99)
HCT: 46 % (ref 36.0–46.0)
Hemoglobin: 15.6 g/dL — ABNORMAL HIGH (ref 12.0–15.0)
POTASSIUM: 4.6 meq/L (ref 3.7–5.3)
SODIUM: 136 meq/L — AB (ref 137–147)
TCO2: 28 mmol/L (ref 0–100)

## 2013-07-26 LAB — PROTIME-INR
INR: 0.97 (ref 0.00–1.49)
PROTHROMBIN TIME: 12.7 s (ref 11.6–15.2)

## 2013-07-26 LAB — APTT: aPTT: 33 seconds (ref 24–37)

## 2013-07-26 LAB — GLUCOSE, CAPILLARY: Glucose-Capillary: 98 mg/dL (ref 70–99)

## 2013-07-26 LAB — ETHANOL: Alcohol, Ethyl (B): <11 mg/dL (ref 0–11)

## 2013-07-26 LAB — TROPONIN I

## 2013-07-26 NOTE — ED Provider Notes (Signed)
CSN: [id number]     Arrival date & time 07/26/13  2029 History   First MD Initiated Contact with Patient 07/26/13 2033     Chief Complaint  Patient presents with  . Altered Mental Status  . Headache   (Consider location/radiation/quality/duration/timing/severity/associated sxs/prior Treatment) HPI Comments: Patient is an 80 year old female past medical history significant for paroxysmal atrial fibrillation, aortic stenosis, anxiety, GERD, arthritis, asthma presenting to the emergency department for 2 episodes of confusion. The first episode occurred around 2 PM while the patient was cooking. According to the patient and her husband she was using the wrong end of the spoon while she was cooking and could not figure out how to turn the spoon around. The patient states that this confusion passed and then returned around 6:30PM. The patient states she was trying to read and could not process what the words were saying. The husband also states the patient was speaking clearly, but with incorrect words. This has also passed. Denies any fevers, nausea, vomiting, CP, abdominal pain, SOB, numbness or weakness. No familial history of CVAs.    Past Medical History  Diagnosis Date  . Allergy     allergic rhinitis  . Cancer     history of skin cancer of the nose  . Asthma 2000    mild. tried Advair after bronchospasm after exposure to cats  . Shoulder pain, left 05/2010    with rotator cuff tear--Dr Alfonso Ramus  . Paroxysmal atrial fibrillation     pt does not want anticoagulation  . Aortic stenosis     EF normal.  Mild aortic stenosis. Mild to moderate aortic regurgitation  . Anxiety   . Shortness of breath     occ  . GERD (gastroesophageal reflux disease)     occ  . Arthritis    Past Surgical History  Procedure Laterality Date  . Hernia repair  2003  . Acromioplasty  2000    right shoulder, arthroscopic  . Lumbar laminectomy/decompression microdiscectomy  04/01/2012    Procedure: LUMBAR  LAMINECTOMY/DECOMPRESSION MICRODISCECTOMY 1 LEVEL;  Surgeon: Eustace Moore, MD;  Location: [facility];  Service: Neurosurgery;  Laterality: Right;  Right Lumbar four-five extraforaminal microdiskectomy    History reviewed. No pertinent family history. History  Substance Use Topics  . Smoking status: Never Smoker   . Smokeless tobacco: Never Used     Comment: occ wine  . Alcohol Use: Yes   OB History   Grav Para Term Preterm Abortions TAB SAB Ect Mult Living                 Review of Systems  Constitutional: Negative for fever and chills.  Respiratory: Negative for shortness of breath.   Cardiovascular: Negative for chest pain.  Gastrointestinal: Negative for nausea, vomiting and abdominal pain.  Neurological: Negative for weakness and numbness.       Aphasia   All other systems reviewed and are negative.    Allergies  Tape  Home Medications   Current Outpatient Rx  Name  Route  Sig  Dispense  Refill  . acetaminophen (TYLENOL) 325 MG tablet   Oral   Take 325-650 mg by mouth every 6 (six) hours as needed. For pain         . aspirin EC 81 MG tablet   Oral   Take 81 mg by mouth every morning.         . bisoprolol (ZEBETA) 5 MG tablet   Oral   Take 2.5 mg by  mouth every morning.         . Multiple Vitamin (MULTIVITAMIN WITH MINERALS) TABS tablet   Oral   Take 1 tablet by mouth daily.         . sertraline (ZOLOFT) 25 MG tablet   Oral   Take 37.5 mg by mouth every morning.          BP 107/69  Pulse 50  Temp(Src) 97.8 F (36.6 C) (Oral)  Resp 23  SpO2 96% Physical Exam  Constitutional: She is oriented to person, place, and time. She appears well-developed and well-nourished. No distress.  HENT:  Head: Normocephalic and atraumatic.  Right Ear: External ear normal.  Left Ear: External ear normal.  Nose: Nose normal.  Mouth/Throat: Oropharynx is clear and moist. No oropharyngeal exudate.  Eyes: Conjunctivae and EOM are normal. Pupils are equal,  round, and reactive to light.  Neck: Normal range of motion. Neck supple.  Cardiovascular: Regular rhythm and intact distal pulses.  Bradycardia present.   Murmur heard.  Systolic murmur is present  Pulmonary/Chest: Effort normal and breath sounds normal. No respiratory distress.  Abdominal: Soft. There is no tenderness.  Neurological: She is alert and oriented to person, place, and time. She has normal strength. No cranial nerve deficit or sensory deficit. Gait normal. GCS eye subscore is 4. GCS verbal subscore is 5. GCS motor subscore is 6.  No pronator drift. Bilateral heel-knee-shin intact.  Skin: Skin is warm and dry. She is not diaphoretic.    ED Course  Procedures (including critical care time) Medications - No data to display  Labs Review Labs Reviewed  DIFFERENTIAL - Abnormal; Notable for the following:    Eosinophils Relative 6 (*)    All other components within normal limits  COMPREHENSIVE METABOLIC PANEL - Abnormal; Notable for the following:    BUN 25 (*)    Albumin 3.4 (*)    GFR calc non Af Amer 63 (*)    GFR calc Af Amer 74 (*)    All other components within normal limits  POCT I-STAT, CHEM 8 - Abnormal; Notable for the following:    Sodium 136 (*)    BUN 31 (*)    Hemoglobin 15.6 (*)    All other components within normal limits  ETHANOL  PROTIME-INR  APTT  CBC  TROPONIN I  URINE RAPID DRUG SCREEN (HOSP PERFORMED)  GLUCOSE, CAPILLARY  URINALYSIS, ROUTINE W REFLEX MICROSCOPIC  POCT I-STAT TROPONIN I   Imaging Review Dg Chest 2 View  07/26/2013   CLINICAL DATA:  Cough, confusion  EXAM: CHEST  2 VIEW  COMPARISON:  08/19/2012  FINDINGS: Enlarged cardiac silhouette. Aortic atherosclerosis and mild tortuosity. Interstitial prominence and peripheral reticular opacities. Mild retrocardiac opacity. No pleural effusion or pneumothorax. Rightward curvature of the thoracic spine with multilevel degenerative changes. Osteopenia. Otherwise limited osseous evaluation.   IMPRESSION: Enlarged cardiac silhouette is similar prior.  Interstitial prominence is at least in part chronic. Though a superimposed atypical/viral infection or interstitial edema is suspected.  Mild retrocardiac opacity; atelectasis/scarring versus mild infiltrate.   Electronically Signed   By: Carlos Levering M.D.   On: 07/26/2013 23:15   Ct Head Wo Contrast  07/26/2013   CLINICAL DATA:  Altered mental status, headache  EXAM: CT HEAD WITHOUT CONTRAST  TECHNIQUE: Contiguous axial images were obtained from the base of the skull through the vertex without intravenous contrast.  COMPARISON:  None.  FINDINGS: Mild prominence of the sulci, cisterns, and ventricles, in keeping with volume loss.  Slight prominence of the extra-axial space versus CSF attenuation extra-axial collection along the left anterolateral frontal lobe without significant underlying mass effect. Moderate periventricular and subcortical white matter hypodensities, a nonspecific finding often seen in the setting of chronic microangiopathic change. Evidence of prior right cerebellar lacunar infarction. No definite CT evidence of acute infarction. No intraparenchymal hemorrhage. The visualized paranasal sinuses and mastoid air cells are predominantly clear.  IMPRESSION: Volume loss and white matter changes as above.  Evidence of prior right cerebellar lacunar infarction. No definite CT evidence of an acute intracranial abnormality.  There may be small left frontal subdural hygroma however no significant underlying mass effect.  MRI recommended if concern for acute ischemia/pathology persists.   Electronically Signed   By: Carlos Levering M.D.   On: 07/26/2013 22:26    EKG Interpretation    Date/Time:  Monday July 26 2013 20:40:23 EST Ventricular Rate:  46 PR Interval:  247 QRS Duration: 85 QT Interval:  515 QTC Calculation: 450 R Axis:   -31 Text Interpretation:  Age not entered, assumed to be  79 years old for purpose of ECG  interpretation Sinus bradycardia Atrial premature complex Prolonged PR interval Probable left atrial enlargement Left axis deviation Probable anteroseptal infarct, old No significant change since 2002 Confirmed by WARD  DO, KRISTEN ([id number]) on 07/26/2013 8:49:38 PM            MDM   1. TIA (transient ischemic attack)     Filed Vitals:   07/26/13 2221  BP: 107/69  Pulse:   Temp: 97.8 F (36.6 C)  Resp: 23   Afebrile, NAD, non-toxic appearing, AAOx4. No neuro focal deficits. Symptoms concerning for TIA. All symptoms are resolved at this time and patient is at baseline. Labs reviewed. EKG reviewed. CT scan reviewed. Patient case consuls up with Dr. Nicole Kindred neurologist who recommends admission. Patient will be transferred to [facility] for TIA workup. The patient appears reasonably stabilized for admission considering the current resources, flow, and capabilities available in the ED at this time, and I doubt any other [facility] requiring further screening and/or treatment in the ED prior to admission.Patient d/w with Dr. Leonides Schanz, agrees with plan.      Harlow Mares, PA-C 07/27/13 0011

## 2013-07-26 NOTE — ED Notes (Signed)
Pt bradycardiac on monitor MD and PA aware.

## 2013-07-26 NOTE — ED Provider Notes (Signed)
Medical screening examination/treatment/procedure(s) were conducted as a shared visit with non-physician practitioner(s) and myself.  I personally evaluated the patient during the encounter.  EKG Interpretation    Date/Time:  Monday July 26 2013 20:40:23 EST Ventricular Rate:  46 PR Interval:  247 QRS Duration: 85 QT Interval:  515 QTC Calculation: 450 R Axis:   -31 Text Interpretation:  Age not entered, assumed to be  79 years old for purpose of ECG interpretation Sinus bradycardia Atrial premature complex Prolonged PR interval Probable left atrial enlargement Left axis deviation Probable anteroseptal infarct, old No significant change since 2002 Confirmed by Lamoine Fredricksen  DO, Sandrine Bloodsworth ([id number]) on 07/26/2013 8:49:38 PM            Pt is a 80 y.o. female with a history of atrial fibrillation not on anticoagulation who presents emergency department with episode of confusion and difficulty using her upper extremities and difficulty speaking that occurred earlier today. Patient is now back to her baseline. She has no complaints. She is hemodynamically stable. Neurologically intact. Will obtain today workup and admit.  [place], DO 07/26/13 2138

## 2013-07-26 NOTE — ED Notes (Signed)
At [facility] pt became confused and was trying to use the opposite end of a spoon to stir things as she was cooking. Husband states that this went away and came back at around 6:30pm. Pt states that she was trying to read and the words looked jumbled. Also noted odd feeling in her head.

## 2013-07-27 ENCOUNTER — Inpatient Hospital Stay (HOSPITAL_COMMUNITY): Payer: Medicare Other

## 2013-07-27 ENCOUNTER — Encounter (HOSPITAL_COMMUNITY): Payer: Self-pay | Admitting: Internal Medicine

## 2013-07-27 DIAGNOSIS — G459 Transient cerebral ischemic attack, unspecified: Secondary | ICD-10-CM

## 2013-07-27 DIAGNOSIS — F411 Generalized anxiety disorder: Secondary | ICD-10-CM | POA: Diagnosis not present

## 2013-07-27 DIAGNOSIS — I6350 Cerebral infarction due to unspecified occlusion or stenosis of unspecified cerebral artery: Secondary | ICD-10-CM | POA: Diagnosis not present

## 2013-07-27 DIAGNOSIS — I359 Nonrheumatic aortic valve disorder, unspecified: Secondary | ICD-10-CM

## 2013-07-27 DIAGNOSIS — I4891 Unspecified atrial fibrillation: Secondary | ICD-10-CM | POA: Diagnosis not present

## 2013-07-27 DIAGNOSIS — Z8679 Personal history of other diseases of the circulatory system: Secondary | ICD-10-CM | POA: Diagnosis not present

## 2013-07-27 DIAGNOSIS — I672 Cerebral atherosclerosis: Secondary | ICD-10-CM | POA: Diagnosis not present

## 2013-07-27 LAB — URINALYSIS, ROUTINE W REFLEX MICROSCOPIC
Bilirubin Urine: NEGATIVE
GLUCOSE, UA: NEGATIVE mg/dL
HGB URINE DIPSTICK: NEGATIVE
KETONES UR: NEGATIVE mg/dL
Nitrite: NEGATIVE
PH: 6.5 (ref 5.0–8.0)
Protein, ur: NEGATIVE mg/dL
Specific Gravity, Urine: 1.009 (ref 1.005–1.030)
Urobilinogen, UA: 0.2 mg/dL (ref 0.0–1.0)

## 2013-07-27 LAB — LIPID PANEL
CHOL/HDL RATIO: 3.7 ratio
CHOLESTEROL: 191 mg/dL (ref 0–200)
HDL: 51 mg/dL (ref >39)
LDL Cholesterol: 119 mg/dL — ABNORMAL HIGH (ref 0–99)
TRIGLYCERIDES: 104 mg/dL (ref <150)
VLDL: 21 mg/dL (ref 0–40)

## 2013-07-27 LAB — COMPREHENSIVE METABOLIC PANEL
ALBUMIN: 3.2 g/dL — AB (ref 3.5–5.2)
ALK PHOS: 97 U/L (ref 39–117)
ALT: 14 U/L (ref 0–35)
AST: 21 U/L (ref 0–37)
BUN: 19 mg/dL (ref 6–23)
CHLORIDE: 100 meq/L (ref 96–112)
CO2: 26 mEq/L (ref 19–32)
Calcium: 8.9 mg/dL (ref 8.4–10.5)
Creatinine, Ser: 0.82 mg/dL (ref 0.50–1.10)
GFR calc Af Amer: 72 mL/min — ABNORMAL LOW (ref >90)
GFR calc non Af Amer: 63 mL/min — ABNORMAL LOW (ref >90)
Glucose, Bld: 90 mg/dL (ref 70–99)
POTASSIUM: 3.9 meq/L (ref 3.7–5.3)
SODIUM: 139 meq/L (ref 137–147)
TOTAL PROTEIN: 6.7 g/dL (ref 6.0–8.3)
Total Bilirubin: 0.7 mg/dL (ref 0.3–1.2)

## 2013-07-27 LAB — CBC
HEMATOCRIT: 41.9 % (ref 36.0–46.0)
Hemoglobin: 14.3 g/dL (ref 12.0–15.0)
MCH: 33.3 pg (ref 26.0–34.0)
MCHC: 34.1 g/dL (ref 30.0–36.0)
MCV: 97.4 fL (ref 78.0–100.0)
PLATELETS: 226 10*3/uL (ref 150–400)
RBC: 4.3 MIL/uL (ref 3.87–5.11)
RDW: 12.8 % (ref 11.5–15.5)
WBC: 7.3 10*3/uL (ref 4.0–10.5)

## 2013-07-27 LAB — HEMOGLOBIN A1C
Hgb A1c MFr Bld: 5.6 % (ref <5.7)
MEAN PLASMA GLUCOSE: 114 mg/dL (ref <117)

## 2013-07-27 LAB — URINE MICROSCOPIC-ADD ON

## 2013-07-27 LAB — AMMONIA: Ammonia: 35 umol/L (ref 11–60)

## 2013-07-27 MED ORDER — ACETAMINOPHEN 325 MG PO TABS
650.0000 mg | ORAL_TABLET | ORAL | Status: DC | PRN
  Administered 2013-07-28: 650 mg via ORAL
  Filled 2013-07-27: qty 2

## 2013-07-27 MED ORDER — BISOPROLOL FUMARATE 5 MG PO TABS
2.5000 mg | ORAL_TABLET | Freq: Every morning | ORAL | Status: DC
  Filled 2013-07-27: qty 0.5

## 2013-07-27 MED ORDER — ASPIRIN 325 MG PO TABS
325.0000 mg | ORAL_TABLET | Freq: Every day | ORAL | Status: DC
  Administered 2013-07-27: 325 mg via ORAL
  Filled 2013-07-27: qty 1

## 2013-07-27 MED ORDER — ENOXAPARIN SODIUM 40 MG/0.4ML ~~LOC~~ SOLN
40.0000 mg | SUBCUTANEOUS | Status: DC
  Administered 2013-07-27: 40 mg via SUBCUTANEOUS
  Filled 2013-07-27: qty 0.4

## 2013-07-27 MED ORDER — ACETAMINOPHEN 325 MG PO TABS: 325.0000 mg | ORAL_TABLET | Freq: Four times a day (QID) | ORAL | Status: DC | PRN

## 2013-07-27 MED ORDER — APIXABAN 5 MG PO TABS
5.0000 mg | ORAL_TABLET | Freq: Two times a day (BID) | ORAL | Status: DC
  Administered 2013-07-27 – 2013-07-28 (×3): 5 mg via ORAL
  Filled 2013-07-27 (×4): qty 1

## 2013-07-27 MED ORDER — ADULT MULTIVITAMIN W/MINERALS CH
1.0000 | ORAL_TABLET | Freq: Every day | ORAL | Status: DC
  Administered 2013-07-27 – 2013-07-28 (×2): 1 via ORAL
  Filled 2013-07-27 (×2): qty 1

## 2013-07-27 MED ORDER — SODIUM CHLORIDE 0.9 % IV SOLN
INTRAVENOUS | Status: AC
  Administered 2013-07-27: 03:00:00 via INTRAVENOUS

## 2013-07-27 MED ORDER — SERTRALINE HCL 25 MG PO TABS
37.5000 mg | ORAL_TABLET | Freq: Every morning | ORAL | Status: DC
  Administered 2013-07-27 – 2013-07-28 (×2): 37.5 mg via ORAL
  Filled 2013-07-27 (×2): qty 1.5

## 2013-07-27 NOTE — H&P (Signed)
[facility] History and Physical  Caroline Myers RSW:[id number] DOB: 10/20/1923 DOA: 07/26/2013  Referring physician: ER physician. PCP: Mathews Argyle, MD  Chief Complaint: Visual changes and confusion.  HPI: Caroline Myers is a 80 y.o. female with history of paroxysmal atrial fibrillation not on Coumadin, history of CHF presently on no medications, anxiety, history of asthma presents to the ER because of patient had previous episode of reading difficulty and confusion. Patient states around 2 PM yesterday patient was reading when patient suddenly felt she was not able to make sense of what she was reading and that sensation lasted for around one hour and got resolved by itself. Patient the evening was found to be confused and using the wrong utensils while cooking and eating by her husband. Patient was brought to the ER and CT head did not show anything acute. As a patient is alert and awake oriented to time place and person and patient is nonfocal. Patient has been admitted for further observation and management. Patient denies any chest pain or shortness of breath fever chills headache nausea vomiting abdominal pain or diarrhea.   Review of Systems: As presented in the history of presenting illness, rest negative.  Past Medical History  Diagnosis Date  . Allergy     allergic rhinitis  . Cancer     history of skin cancer of the nose  . Asthma 2000    mild. tried Advair after bronchospasm after exposure to cats  . Shoulder pain, left 05/2010    with rotator cuff tear--Dr Alfonso Ramus  . Paroxysmal atrial fibrillation     pt does not want anticoagulation  . Aortic stenosis     EF normal.  Mild aortic stenosis. Mild to moderate aortic regurgitation  . Anxiety   . Shortness of breath     occ  . GERD (gastroesophageal reflux disease)     occ  . Arthritis    Past Surgical History  Procedure Laterality Date  . Hernia repair  2003  . Acromioplasty  2000    right shoulder,  arthroscopic  . Lumbar laminectomy/decompression microdiscectomy  04/01/2012    Procedure: LUMBAR LAMINECTOMY/DECOMPRESSION MICRODISCECTOMY 1 LEVEL;  Surgeon: Eustace Moore, MD;  Location: [place] [facility];  Service: Neurosurgery;  Laterality: Right;  Right Lumbar four-five extraforaminal microdiskectomy    Social History:  reports that she has never smoked. She has never used smokeless tobacco. She reports that she drinks alcohol. She reports that she does not use illicit drugs. Where does patient live home. Can patient participate in ADLs? Yes.  Allergies  Allergen Reactions  . Tape Other (See Comments)    Rips skin    Family History:  Family History  Problem Relation Age of Onset  . Arrhythmia Father       Prior to Admission medications   Medication Sig Start Date End Date Taking? Authorizing Provider  acetaminophen (TYLENOL) 325 MG tablet Take 325-650 mg by mouth every 6 (six) hours as needed. For pain   Yes Historical Provider, MD  aspirin EC 81 MG tablet Take 81 mg by mouth every morning.   Yes Historical Provider, MD  bisoprolol (ZEBETA) 5 MG tablet Take 2.5 mg by mouth every morning.   Yes Historical Provider, MD  Multiple Vitamin (MULTIVITAMIN WITH MINERALS) TABS tablet Take 1 tablet by mouth daily.   Yes Historical Provider, MD  sertraline (ZOLOFT) 25 MG tablet Take 37.5 mg by mouth every morning.   Yes Historical Provider, MD    Physical Exam:  Filed Vitals:   07/26/13 2041 07/26/13 2221  BP: 152/73 107/69  Pulse: 50   Temp:  97.8 F (36.6 C)  TempSrc:  Oral  Resp: 25 23  SpO2: 95% 96%     General:  Well-developed well-nourished.  Eyes: Anicteric no pallor.  ENT: No discharge from the ears eyes nose mouth.  Neck: No mass felt.  Cardiovascular: S1-S2 heard.  Respiratory: No rhonchi or crepitations.  Abdomen: Soft nontender bowel sounds present.  Skin: No rash.  Musculoskeletal: No edema.  Psychiatric: Appears normal.  Neurologic: Alert awake oriented  to time place and person. Moves all extremities 5 x 5. No facial asymmetry. Tongue is midline.  Labs on Admission:  Basic Metabolic Panel:  Recent Labs Lab 07/26/13 2047 07/26/13 2125  NA 137 136*  K 4.8 4.6  CL 97 99  CO2 26  --   GLUCOSE 88 85  BUN 25* 31*  CREATININE 0.81 0.90  CALCIUM 9.1  --    Liver Function Tests:  Recent Labs Lab 07/26/13 2047  AST 28  ALT 16  ALKPHOS 106  BILITOT 0.4  PROT 7.4  ALBUMIN 3.4*   No results found for this basename: LIPASE, AMYLASE,  in the last 168 hours No results found for this basename: AMMONIA,  in the last 168 hours CBC:  Recent Labs Lab 07/26/13 2047 07/26/13 2125  WBC 7.3  --   NEUTROABS 4.4  --   HGB 13.7 15.6*  HCT 40.8 46.0  MCV 98.6  --   PLT 229  --    Cardiac Enzymes:  Recent Labs Lab 07/26/13 2047  TROPONINI <0.30    BNP (last 3 results) No results found for this basename: PROBNP,  in the last 8760 hours CBG:  Recent Labs Lab 07/26/13 2045  GLUCAP 98    Radiological Exams on Admission: Dg Chest 2 View  07/26/2013   CLINICAL DATA:  Cough, confusion  EXAM: CHEST  2 VIEW  COMPARISON:  08/19/2012  FINDINGS: Enlarged cardiac silhouette. Aortic atherosclerosis and mild tortuosity. Interstitial prominence and peripheral reticular opacities. Mild retrocardiac opacity. No pleural effusion or pneumothorax. Rightward curvature of the thoracic spine with multilevel degenerative changes. Osteopenia. Otherwise limited osseous evaluation.  IMPRESSION: Enlarged cardiac silhouette is similar prior.  Interstitial prominence is at least in part chronic. Though a superimposed atypical/viral infection or interstitial edema is suspected.  Mild retrocardiac opacity; atelectasis/scarring versus mild infiltrate.   Electronically Signed   By: Carlos Levering M.D.   On: 07/26/2013 23:15   Ct Head Wo Contrast  07/26/2013   CLINICAL DATA:  Altered mental status, headache  EXAM: CT HEAD WITHOUT CONTRAST  TECHNIQUE: Contiguous  axial images were obtained from the base of the skull through the vertex without intravenous contrast.  COMPARISON:  None.  FINDINGS: Mild prominence of the sulci, cisterns, and ventricles, in keeping with volume loss. Slight prominence of the extra-axial space versus CSF attenuation extra-axial collection along the left anterolateral frontal lobe without significant underlying mass effect. Moderate periventricular and subcortical white matter hypodensities, a nonspecific finding often seen in the setting of chronic microangiopathic change. Evidence of prior right cerebellar lacunar infarction. No definite CT evidence of acute infarction. No intraparenchymal hemorrhage. The visualized paranasal sinuses and mastoid air cells are predominantly clear.  IMPRESSION: Volume loss and white matter changes as above.  Evidence of prior right cerebellar lacunar infarction. No definite CT evidence of an acute intracranial abnormality.  There may be small left frontal subdural hygroma however no significant underlying  mass effect.  MRI recommended if concern for acute ischemia/pathology persists.   Electronically Signed   By: Carlos Levering M.D.   On: 07/26/2013 22:26    EKG: Independently reviewed. Normal sinus rhythm.  Assessment/Plan Principal Problem:   TIA (transient ischemic attack) Active Problems:   Atrial fibrillation   History of CHF (congestive heart failure)   1. Possible TIA - patient has had brief episode of reading difficulty and after which patient also was later found to be confused. Given her transient symptoms on-call neurologist Dr. Nicole Kindred wants to wrap for TIA. Patient will be transferred to [facility] for further workup and patient is agreeable to transfer. I have placed patient on neurochecks, swallow evaluation. Get MRI/MRA brain, 2-D echo carotid Doppler lipid panel and hemoglobin A1c. Patient is at present include atrial fibrillation for which patient has not wanted to be on  anticoagulant. Presently patient is on aspirin. Check ammonia levels. 2. History of paroxysmal atrial fibrillation - presently in sinus rhythm and rate controlled. Continue bisoprolol. Patient is on aspirin and does not want to be on anticoagulants. 3. History of CHF as per previous cardiac notes and has had normal cardiac catheter and 2-D echo previously - presently asymptomatic at rest. Check 2-D echo. 4. History of asthma - presently not wheezing.  I have reviewed patient's old charts and labs.  Code Status: Full code.  Family Communication: None.  Disposition Plan: Admit for observation.    Altamese Deguire N. [facility] Pager [phone].  If 7PM-7AM, please contact night-coverage [email or web address] Password [facility] 07/27/2013, 12:53 AM

## 2013-07-27 NOTE — Progress Notes (Signed)
   CARE MANAGEMENT NOTE 07/27/2013  Patient:  Caroline Myers, Caroline Myers   Account Number:  [id number]  Date Initiated:  07/27/2013  Documentation initiated by:  Olga Coaster  Subjective/Objective Assessment:   ADMITTED WITH CVA     Action/Plan:   CM FOLLOWING FOR DCP   Anticipated DC Date:  07/31/2013   Anticipated DC Plan:  AWAITING FOR PT/OT EVAL FOR DISPOSITION     DC Planning Services  CM consult          Status of service:  In process, will continue to follow Medicare Important Message given?  NA - LOS <3 / Initial given by admissions (If response is "NO", the following Medicare IM given date fields will be blank)  Per UR Regulation:  Reviewed for med. necessity/level of care/duration of stay  Comments:  1/27/2015Mindi Slicker RN,BSN,MHA [phone]

## 2013-07-27 NOTE — Progress Notes (Signed)
Stroke Team Progress Note  HISTORY HPI: Caroline Myers is an 80 y.o. female history of atrial fibrillation not on anticoagulation who experienced an episode of difficulty with reading comprehension for about one hour starting at 2 PM on 07/26/2013. At around 6 PM she was noted by husband to be confused and was using cooking and eating utensils appropriately. The time she arrived in the emergency room deficits have resolved. She has no previous history of stroke nor TIA. She takes aspirin 81 mg per day. She reportedly has refused anticoagulation with Coumadin. CT scan of her head showed no acute intracranial abnormality. NIH stroke score was 0. Patient was not administerd TPA secondary to rapidly resolving deficits . She was admitted for further evaluation and treatment.  SUBJECTIVE Her echo tech(s) are at the bedside.  Overall she feels her condition is gradually improving.   OBJECTIVE Most recent Vital Signs: Filed Vitals:   07/26/13 2041 07/26/13 2221 07/27/13 0156 07/27/13 0520  BP: 152/73 107/69 145/64 145/54  Pulse: 50  43 47  Temp:  97.8 F (36.6 C) 97.8 F (36.6 C) 97.8 F (36.6 C)  TempSrc:  Oral Oral Oral  Resp: 25 23 20 20   Height:   5\' 5"  (1.651 m)   Weight:   64.2 kg (141 lb 8.6 oz)   SpO2: 95% 96% 95% 91%   CBG (last 3)   Recent Labs  07/26/13 2045  GLUCAP 98    IV Fluid Intake:   . sodium chloride 50 mL/hr at 07/27/13 0315    MEDICATIONS  . aspirin  325 mg Oral Daily  . enoxaparin (LOVENOX) injection  40 mg Subcutaneous Q24H  . multivitamin with minerals  1 tablet Oral Daily  . sertraline  37.5 mg Oral q morning - 10a   PRN:  acetaminophen  Diet:  Cardiac thin liquids Activity:   Bathroom privileges with assistance DVT Prophylaxis:  Lovenox 40 mg sq daily   CLINICALLY SIGNIFICANT STUDIES Basic Metabolic Panel:   Recent Labs Lab 07/26/13 2047 07/26/13 2125 07/27/13 0650  NA 137 136* 139  K 4.8 4.6 3.9  CL 97 99 100  CO2 26  --  26  GLUCOSE 88  85 90  BUN 25* 31* 19  CREATININE 0.81 0.90 0.82  CALCIUM 9.1  --  8.9   Liver Function Tests:   Recent Labs Lab 07/26/13 2047 07/27/13 0650  AST 28 21  ALT 16 14  ALKPHOS 106 97  BILITOT 0.4 0.7  PROT 7.4 6.7  ALBUMIN 3.4* 3.2*   CBC:   Recent Labs Lab 07/26/13 2047 07/26/13 2125 07/27/13 0650  WBC 7.3  --  7.3  NEUTROABS 4.4  --   --   HGB 13.7 15.6* 14.3  HCT 40.8 46.0 41.9  MCV 98.6  --  97.4  PLT 229  --  226   Coagulation:   Recent Labs Lab 07/26/13 2047  LABPROT 12.7  INR 0.97   Cardiac Enzymes:   Recent Labs Lab 07/26/13 2047  TROPONINI <0.30   Urinalysis:   Recent Labs Lab 07/26/13 2308  COLORURINE YELLOW  LABSPEC 1.009  PHURINE 6.5  GLUCOSEU NEGATIVE  HGBUR NEGATIVE  BILIRUBINUR NEGATIVE  KETONESUR NEGATIVE  PROTEINUR NEGATIVE  UROBILINOGEN 0.2  NITRITE NEGATIVE  LEUKOCYTESUR TRACE*   Lipid Panel    Component Value Date/Time   CHOL 191 07/27/2013 0650   TRIG 104 07/27/2013 0650   HDL 51 07/27/2013 0650   CHOLHDL 3.7 07/27/2013 0650   VLDL 21 07/27/2013 0650  LDLCALC 119* 07/27/2013 0650   HgbA1C  No results found for this basename: HGBA1C    Urine Drug Screen:     Component Value Date/Time   LABOPIA NONE DETECTED 07/26/2013 2308   COCAINSCRNUR NONE DETECTED 07/26/2013 2308   LABBENZ NONE DETECTED 07/26/2013 2308   AMPHETMU NONE DETECTED 07/26/2013 2308   THCU NONE DETECTED 07/26/2013 2308   LABBARB NONE DETECTED 07/26/2013 2308    Alcohol Level:   Recent Labs Lab 07/26/13 2047  ETH <11    CT of the brain  07/26/2013    Volume loss and white matter changes as above.  Evidence of prior right cerebellar lacunar infarction. No definite CT evidence of an acute intracranial abnormality.  There may be small left frontal subdural hygroma however no significant underlying mass effect.   MRI of the brain  07/27/2013   1. Small acute right cerebellar infarct. No mass effect or hemorrhage. 2. Several small acute to subacute lacunar  infarcts in the left frontal lobe white matter, favor synchronous small vessel disease rather than embolic disease. 3. Underlying chronic small vessel disease, including a small chronic left cerebellar lacunar infarct.   MRA of the brain  07/27/2013   1. Patent distal vertebral and basilar arteries, the left vertebral terminates in PICA. 2. Moderate to severe bilateral PCA stenosis with preserved distal flow. 3. Mildly dolichoectatic anterior circulation.     2D Echocardiogram  EF 55-60% with no source of embolus.   Carotid Doppler    CXR  07/26/2013   Enlarged cardiac silhouette is similar prior.  Interstitial prominence is at least in part chronic. Though a superimposed atypical/viral infection or interstitial edema is suspected.  Mild retrocardiac opacity; atelectasis/scarring versus mild infiltrate.  EKG  sinus bradycardia. For complete results please see formal report.   Therapy Recommendations   Physical Exam   Pleasant elderly caucasian lady not in distress.Awake alert. Afebrile. Head is nontraumatic. Neck is supple without bruit. Hearing is diminished. Cardiac exam no murmur or gallop. Lungs are clear to auscultation. Distal pulses are well felt. Neurological Exam ;  Awake  Alert oriented x 3. Normal speech and language.eye movements full without nystagmus.fundi were not visualized. Vision acuity and fields appear normal. Hearing is normal. Palatal movements are normal. Face symmetric. Tongue midline. Normal strength, tone, reflexes and coordination. Normal sensation. Gait deferred. ASSESSMENT Caroline Myers is a 80 y.o. female presenting with transient difficulty with reading comprehension as well as transient confusion. Imaging confirms a small right cerebellar infarct - this infarct does not correlate with her symptoms. She likely has a small left brain embolic infarct that is not seen on MRI based on symptoms. Infarct felt to be embolic secondary to known atrial fibrillation.  On  aspirin 81 mg orally every day prior to admission. Now on aspirin 325 mg orally every day for secondary stroke prevention. Patient with resultant dizziness. Work up underway.   Hx atrial fibrillation, not on anticoagulation prior to pt choice Hyperlipidemia, LDL 119, on no statin PTA, now on no statin, goal LDL < 100 (< 70 for diabetics)  Aortic stenosis  Anxiety. On [facility] day # 1  TREATMENT/PLAN  Add eliquis 5 mg po bid for secondary stroke prevention.  Stop aspirin  Add statin - i added zocor 20 mg daily  Burnetta Sabin, MSN, RN, ANVP-BC, ANP-BC, GNP-BC [name] Pager: [phone] 07/27/2013 11:33 AM  I have personally obtained a history, examined the patient, evaluated imaging results, and formulated the  assessment and plan of care. I agree with the above. Antony Contras, MD

## 2013-07-27 NOTE — Consult Note (Signed)
Referring Physician: Dr. Eliseo Squires    Chief Complaint: Transient difficulty with reading comprehension as well as transient confusion.  HPI: Caroline Myers is an 80 y.o. female history of atrial fibrillation not on anticoagulation who experienced an episode of difficulty with reading comprehension for about one hour starting at 2 PM on 07/26/2013. At around 6 PM she was noted by husband to be confused and was using cooking and eating utensils appropriately. The time she arrived in the emergency room deficits have resolved. She has no previous history of stroke nor TIA. She takes aspirin 81 mg per day. She reportedly has refused anticoagulation with Coumadin. CT scan of her head showed no acute intracranial abnormality. NIH stroke score was 0.  LSN:  tPA Given: No: Rapidly resolving deficits MRankin: 0  Past Medical History  Diagnosis Date  . Allergy     allergic rhinitis  . Cancer     history of skin cancer of the nose  . Asthma 2000    mild. tried Advair after bronchospasm after exposure to cats  . Shoulder pain, left 05/2010    with rotator cuff tear--Dr Alfonso Ramus  . Paroxysmal atrial fibrillation     pt does not want anticoagulation  . Aortic stenosis     EF normal.  Mild aortic stenosis. Mild to moderate aortic regurgitation  . Anxiety   . Shortness of breath     occ  . GERD (gastroesophageal reflux disease)     occ  . Arthritis     Family History  Problem Relation Age of Onset  . Arrhythmia Father      Medications: I have reviewed the patient's current medications.  ROS: History obtained from chart review and the patient  General ROS: negative for - chills, fatigue, fever, night sweats, weight gain or weight loss Psychological ROS: negative for - behavioral disorder, hallucinations, memory difficulties, mood swings or suicidal ideation Ophthalmic ROS: negative for - blurry vision, double vision, eye pain or loss of vision ENT ROS: negative for - epistaxis, nasal  discharge, oral lesions, sore throat, tinnitus or vertigo Allergy and Immunology ROS: negative for - hives or itchy/watery eyes Hematological and Lymphatic ROS: negative for - bleeding problems, bruising or swollen lymph nodes Endocrine ROS: negative for - galactorrhea, hair pattern changes, polydipsia/polyuria or temperature intolerance Respiratory ROS: negative for - cough, hemoptysis, shortness of breath or wheezing Cardiovascular ROS: negative for - chest pain, dyspnea on exertion, edema or irregular heartbeat Gastrointestinal ROS: negative for - abdominal pain, diarrhea, hematemesis, nausea/vomiting or stool incontinence Genito-Urinary ROS: negative for - dysuria, hematuria, incontinence or urinary frequency/urgency Musculoskeletal ROS: negative for - joint swelling or muscular weakness Neurological ROS: as noted in HPI Dermatological ROS: negative for rash and skin lesion changes  Physical Examination: Blood pressure 145/64, pulse 43, temperature 97.8 F (36.6 C), temperature source Oral, resp. rate 20, height 5\' 5"  (1.651 m), weight 64.2 kg (141 lb 8.6 oz), SpO2 95.00%.  Neurologic Examination: Mental Status: Alert, oriented, thought content appropriate.  Speech fluent without evidence of aphasia. Able to follow commands without difficulty. Cranial Nerves: II-Visual fields were normal. III/IV/VI-Pupils were equal and reacted. Extraocular movements were full and conjugate.    V/VII-no facial numbness and no facial weakness. VIII-normal. X-normal speech and symmetrical palatal movement. Motor: 5/5 bilaterally with normal tone and bulk Sensory: Normal throughout. Deep Tendon Reflexes: 1+ and symmetric. Plantars: Mute bilaterally Cerebellar: Normal finger-to-nose testing. Carotid auscultation: Normal  Dg Chest 2 View  07/26/2013   CLINICAL DATA:  Cough,  confusion  EXAM: CHEST  2 VIEW  COMPARISON:  08/19/2012  FINDINGS: Enlarged cardiac silhouette. Aortic atherosclerosis and mild  tortuosity. Interstitial prominence and peripheral reticular opacities. Mild retrocardiac opacity. No pleural effusion or pneumothorax. Rightward curvature of the thoracic spine with multilevel degenerative changes. Osteopenia. Otherwise limited osseous evaluation.  IMPRESSION: Enlarged cardiac silhouette is similar prior.  Interstitial prominence is at least in part chronic. Though a superimposed atypical/viral infection or interstitial edema is suspected.  Mild retrocardiac opacity; atelectasis/scarring versus mild infiltrate.   Electronically Signed   By: Carlos Levering M.D.   On: 07/26/2013 23:15   Ct Head Wo Contrast  07/26/2013   CLINICAL DATA:  Altered mental status, headache  EXAM: CT HEAD WITHOUT CONTRAST  TECHNIQUE: Contiguous axial images were obtained from the base of the skull through the vertex without intravenous contrast.  COMPARISON:  None.  FINDINGS: Mild prominence of the sulci, cisterns, and ventricles, in keeping with volume loss. Slight prominence of the extra-axial space versus CSF attenuation extra-axial collection along the left anterolateral frontal lobe without significant underlying mass effect. Moderate periventricular and subcortical white matter hypodensities, a nonspecific finding often seen in the setting of chronic microangiopathic change. Evidence of prior right cerebellar lacunar infarction. No definite CT evidence of acute infarction. No intraparenchymal hemorrhage. The visualized paranasal sinuses and mastoid air cells are predominantly clear.  IMPRESSION: Volume loss and white matter changes as above.  Evidence of prior right cerebellar lacunar infarction. No definite CT evidence of an acute intracranial abnormality.  There may be small left frontal subdural hygroma however no significant underlying mass effect.  MRI recommended if concern for acute ischemia/pathology persists.   Electronically Signed   By: Carlos Levering M.D.   On: 07/26/2013 22:26    Assessment: 80  y.o. female with a history of atrial fibrillation, presenting with probable recurrent transient ischemic attacks.  Stroke Risk Factors - atrial fibrillation  Plan: 1. HgbA1c, fasting lipid panel 2. MRI, MRA  of the brain without contrast 3. Speech consult 4. Echocardiogram 5. Carotid dopplers 6. Prophylactic therapy-Antiplatelet med: Aspirin if patient continues to refuse anticoagulation 7. Risk factor modification 8. Telemetry monitoring   C.R. Nicole Kindred, MD [facility] [phone]  07/27/2013, 6:05 AM

## 2013-07-27 NOTE — Progress Notes (Signed)
  Echocardiogram 2D Echocardiogram has been performed.  Nakisha Chai, [place] 07/27/2013, 11:59 AM

## 2013-07-27 NOTE — Progress Notes (Signed)
VASCULAR LAB PRELIMINARY  PRELIMINARY  PRELIMINARY  PRELIMINARY  Carotid duplex  completed.    Preliminary report:  No significant ICA stenosis noted bilaterally.  Right vertebral artery flow antegrade.  Lt vertebral artery not found.  Jasmin Winberry, RVT 07/27/2013, 4:02 PM

## 2013-07-27 NOTE — Progress Notes (Signed)
Patient admitted by Dr. Raliegh Ip.  MRI/carotids/echo pending.  Patient is brady- will d/c bb and watch HR. Neuro consulted  Eulogio Bear

## 2013-07-28 DIAGNOSIS — I4891 Unspecified atrial fibrillation: Secondary | ICD-10-CM | POA: Diagnosis not present

## 2013-07-28 DIAGNOSIS — E785 Hyperlipidemia, unspecified: Secondary | ICD-10-CM

## 2013-07-28 DIAGNOSIS — I6350 Cerebral infarction due to unspecified occlusion or stenosis of unspecified cerebral artery: Secondary | ICD-10-CM | POA: Diagnosis not present

## 2013-07-28 DIAGNOSIS — G459 Transient cerebral ischemic attack, unspecified: Secondary | ICD-10-CM | POA: Diagnosis not present

## 2013-07-28 MED ORDER — APIXABAN 5 MG PO TABS: 5.0000 mg | ORAL_TABLET | Freq: Two times a day (BID) | ORAL | Status: DC

## 2013-07-28 MED ORDER — SIMVASTATIN 20 MG PO TABS
20.0000 mg | ORAL_TABLET | Freq: Every day | ORAL | Status: DC
  Administered 2013-07-28: 20 mg via ORAL
  Filled 2013-07-28: qty 1

## 2013-07-28 MED ORDER — SIMVASTATIN 20 MG PO TABS: 20.0000 mg | ORAL_TABLET | Freq: Every day | ORAL | Status: DC

## 2013-07-28 NOTE — Progress Notes (Signed)
PT Cancellation and Discharge Note  Patient Details Name: Caroline Myers MRN: [id number] DOB: [birth year]   Cancelled Treatment:    Reason Eval/Treat Not Completed: PT screened, no needs identified, will sign off.  Spoke with OT [facility]), and pt is ambulating independently and has no acute PT needs at this time.  Will sign off.     Aquilla Voiles, Thornton Papas 07/28/2013, 12:03 PM

## 2013-07-28 NOTE — Progress Notes (Signed)
Talked to patient about Eliquis; patient stated that she is currently on the medication and does not have a problem with the copay; patient has Medicare part A &B and [facility]; CM gave the patient a free 30 day trial of Eliquis hospital to home kit; Mindi Slicker RN,BSN,MHA [phone]

## 2013-07-28 NOTE — Evaluation (Signed)
Occupational Therapy Evaluation and Discharge Summary Patient Details Name: Caroline Myers MRN: [id number] DOB: [birth year] Today's Date: 07/28/2013 Time: 3244-0102 OT Time Calculation (min): 30 min  OT Assessment / Plan / Recommendation History of present illness Pt is an 80 yo female admitted with an episode at home of mild confusion while cooking and inability to read.  Pt found to have a TIA.     Clinical Impression   Pt admitted with the above dianosis and seems to have returned to at or close to baseline.  Pt walked in hallway with no LOB and no AD.  Pt with occasional word finding deficits but functional.  Pt answered all questions quickly and appropriately.  Signs and symptoms of a stroke reviewed.    OT Assessment  Patient does not need any further OT services    Follow Up Recommendations  No OT follow up;Supervision - Intermittent    Barriers to Discharge      Equipment Recommendations  None recommended by OT    Recommendations for Other Services    Frequency       Precautions / Restrictions Precautions Precautions: None Precaution Comments: Pt reports feeling unsteady on her feet at times prior to her episode and took one fall last year.  Pt steady walking in hallway and declined being educated on use of a cane. Restrictions Weight Bearing Restrictions: No   Pertinent Vitals/Pain Pt with no c/o pain.    ADL  Eating/Feeding: Performed;Independent Where Assessed - Eating/Feeding: Chair Grooming: Performed;Independent Where Assessed - Grooming: Unsupported standing Upper Body Bathing: Simulated;Set up Where Assessed - Upper Body Bathing: Unsupported sitting Lower Body Bathing: Simulated;Set up Where Assessed - Lower Body Bathing: Unsupported sit to stand Upper Body Dressing: Performed;Independent Where Assessed - Upper Body Dressing: Unsupported sitting Lower Body Dressing: Performed;Supervision/safety Where Assessed - Lower Body Dressing: Unsupported sit to  stand Toilet Transfer: Performed;Independent Toilet Transfer Method: Stand pivot;Sit to Loss adjuster, chartered: Comfort height toilet Toileting - Clothing Manipulation and Hygiene: Performed;Independent Where Assessed - Toileting Clothing Manipulation and Hygiene: Standing Transfers/Ambulation Related to ADLs: Pt walked 500 ft in hallway with no LOB.  Pt feels she is very close to her baseline ambulation status. ADL Comments: Pt I with all adls.    OT Diagnosis:    OT Problem List:   OT Treatment Interventions:     OT Goals(Current goals can be found in the care plan section) Acute Rehab OT Goals Patient Stated Goal: to go home  Visit Information  Last OT Received On: 07/28/13 Assistance Needed: +1 History of Present Illness: Pt is an 80 yo female admitted with an episode at home of mild confusion while cooking and inability to read.  Pt found to have a TIA.         Prior [place] expects to be discharged to:: Private residence Living Arrangements: Spouse/significant other Available Help at Discharge: Available 24 hours/day Type of Home: House Home Access: Stairs to enter [employer] of Steps: 1 Home Layout: One level Home Equipment: Walker - 2 wheels;Grab bars - toilet;Grab bars - tub/shower Prior Function Level of Independence: Independent Comments: Pt walks 30 min every day and drives on occasion. Communication Communication: Other (comment) (occasional word finding deficits.) Dominant Hand: Right         Vision/Perception Vision - History Baseline Vision: Wears glasses all the time Patient Visual Report: No change from baseline Vision - Assessment Vision Assessment: Vision tested Ocular Range of Motion: Within  Functional Limits Alignment/Gaze Preference: Within Defined Limits Tracking/Visual Pursuits: Able to track stimulus in all quads without difficulty Visual Fields: No apparent  deficits Perception Perception: Within Functional Limits Praxis Praxis: Intact   Cognition  Cognition Arousal/Alertness: Awake/alert Behavior During Therapy: WFL for tasks assessed/performed Overall Cognitive Status: Within Functional Limits for tasks assessed    Extremity/Trunk Assessment Upper Extremity Assessment Upper Extremity Assessment: Overall WFL for tasks assessed Lower Extremity Assessment Lower Extremity Assessment: Overall WFL for tasks assessed Cervical / Trunk Assessment Cervical / Trunk Assessment: Normal     Mobility Bed Mobility Overal bed mobility: Independent Transfers Overall transfer level: Independent Equipment used: None General transfer comment: Pt safe on her feet.     Exercise     Balance Balance Overall balance assessment: No apparent balance deficits (not formally assessed)   End of Session OT - End of Session Activity Tolerance: Patient tolerated treatment well Patient left: in bed;with call bell/phone within reach;with family/visitor present Nurse Communication: Mobility status  GO     Glenford Peers 07/28/2013, 11:53 AM [phone]

## 2013-07-28 NOTE — Discharge Summary (Signed)
Physician Discharge Summary  Caroline Myers KVQ:[id number] DOB: 1924/05/28 DOA: 07/26/2013  PCP: Ginette Otto, MD  Admit date: 07/26/2013 Discharge date: 07/28/2013  Time spent: 35 minutes  Recommendations for Outpatient Follow-up:  1. Cbc 1 month  Discharge Diagnoses:  Active Problems:   Atrial fibrillation   History of CHF (congestive heart failure)   CVA (cerebral vascular accident)   Discharge Condition: improved  Diet recommendation: cardiac  Filed Weights   07/27/13 0156  Weight: 64.2 kg (141 lb 8.6 oz)    History of present illness:  Caroline Myers is a 80 y.o. female with history of paroxysmal atrial fibrillation not on Coumadin, history of CHF presently on no medications, anxiety, history of asthma presents to the ER because of patient had previous episode of reading difficulty and confusion. Patient states around 2 PM yesterday patient was reading when patient suddenly felt she was not able to make sense of what she was reading and that sensation lasted for around one hour and got resolved by itself. Patient the evening was found to be confused and using the wrong utensils while cooking and eating by her husband. Patient was brought to the ER and CT head did not show anything acute. As a patient is alert and awake oriented to time place and person and patient is nonfocal. Patient has been admitted for further observation and management. Patient denies any chest pain or shortness of breath fever chills headache nausea vomiting abdominal pain or diarrhea.    Hospital Course:  Ms. Caroline Myers is a 80 y.o. female presenting with transient difficulty with reading comprehension as well as transient confusion. Imaging confirms a small right cerebellar infarct - this infarct does not correlate with her symptoms. She likely has a small left brain embolic infarct that is not seen on MRI based on symptoms. Infarct felt to be embolic secondary to known atrial  fibrillation. On aspirin 81 mg orally every day prior to admission. Now on eliquis 5 mg bid for secondary stroke prevention. Patient with resultant dizziness. Work up completed.  Hx atrial fibrillation, not on anticoagulation prior to pt choice Hyperlipidemia, LDL 119, on no statin PTA, now on zocor 20 mg daily, goal LDL < 100 (< 70 for diabetics) HgbA1C: 5.6 Aortic stenosis  Anxiety. On zoloft  Procedures: Echo: Study Conclusions  - Left ventricle: The cavity size was normal. Wall thickness was increased in a pattern of moderate LVH. Systolic function was normal. The estimated ejection fraction was in the range of 55% to 60%. Wall motion was normal; there were no regional wall motion abnormalities. - Aortic valve: There was mild stenosis. Trivial regurgitation. Valve area: 1.64cm^2(VTI). Valve area: 1.57cm^2 (Vmax). - Right atrium: The atrium was mildly dilated    Consultations:  neuro  Discharge Exam: Filed Vitals:   07/28/13 1041  BP: 128/82  Pulse: 61  Temp: 97.8 F (36.6 C)  Resp: 20    General: pleasant/cooperative- back to normal per family Cardiovascular: irr Respiratory: clear  Discharge Instructions      Discharge Orders   Future Orders Complete By Expires   Diet - low sodium heart healthy  As directed    Increase activity slowly  As directed        Medication List    STOP taking these medications       aspirin EC 81 MG tablet     bisoprolol 5 MG tablet  Commonly known as:  ZEBETA      TAKE these medications  acetaminophen 325 MG tablet  Commonly known as:  TYLENOL  Take 325-650 mg by mouth every 6 (six) hours as needed. For pain     apixaban 5 MG Tabs tablet  Commonly known as:  ELIQUIS  Take 1 tablet (5 mg total) by mouth 2 (two) times daily.     multivitamin with minerals Tabs tablet  Take 1 tablet by mouth daily.     sertraline 25 MG tablet  Commonly known as:  ZOLOFT  Take 37.5 mg by mouth every morning.     simvastatin  20 MG tablet  Commonly known as:  ZOCOR  Take 1 tablet (20 mg total) by mouth daily at 6 PM.       Allergies  Allergen Reactions  . Tape Other (See Comments)    Rips skin   Follow-up Information   Follow up with Forbes Cellar, MD. Schedule an appointment as soon as possible for a visit in 2 months. (stroke clinic)    Specialties:  Neurology, Radiology   Contact information:   [address] [place] [address] [phone]        The results of significant diagnostics from this hospitalization (including imaging, microbiology, ancillary and laboratory) are listed below for reference.    Significant Diagnostic Studies: Dg Chest 2 View  07/26/2013   CLINICAL DATA:  Cough, confusion  EXAM: CHEST  2 VIEW  COMPARISON:  08/19/2012  FINDINGS: Enlarged cardiac silhouette. Aortic atherosclerosis and mild tortuosity. Interstitial prominence and peripheral reticular opacities. Mild retrocardiac opacity. No pleural effusion or pneumothorax. Rightward curvature of the thoracic spine with multilevel degenerative changes. Osteopenia. Otherwise limited osseous evaluation.  IMPRESSION: Enlarged cardiac silhouette is similar prior.  Interstitial prominence is at least in part chronic. Though a superimposed atypical/viral infection or interstitial edema is suspected.  Mild retrocardiac opacity; atelectasis/scarring versus mild infiltrate.   Electronically Signed   By: Carlos Levering M.D.   On: 07/26/2013 23:15   Ct Head Wo Contrast  07/26/2013   CLINICAL DATA:  Altered mental status, headache  EXAM: CT HEAD WITHOUT CONTRAST  TECHNIQUE: Contiguous axial images were obtained from the base of the skull through the vertex without intravenous contrast.  COMPARISON:  None.  FINDINGS: Mild prominence of the sulci, cisterns, and ventricles, in keeping with volume loss. Slight prominence of the extra-axial space versus CSF attenuation extra-axial collection along the left anterolateral frontal  lobe without significant underlying mass effect. Moderate periventricular and subcortical white matter hypodensities, a nonspecific finding often seen in the setting of chronic microangiopathic change. Evidence of prior right cerebellar lacunar infarction. No definite CT evidence of acute infarction. No intraparenchymal hemorrhage. The visualized paranasal sinuses and mastoid air cells are predominantly clear.  IMPRESSION: Volume loss and white matter changes as above.  Evidence of prior right cerebellar lacunar infarction. No definite CT evidence of an acute intracranial abnormality.  There may be small left frontal subdural hygroma however no significant underlying mass effect.  MRI recommended if concern for acute ischemia/pathology persists.   Electronically Signed   By: Carlos Levering M.D.   On: 07/26/2013 22:26   Mri Brain Without Contrast  07/27/2013   CLINICAL DATA:  80 year old female with confusion and visual changes. Initial encounter. TIA.  EXAM: MRI HEAD WITHOUT CONTRAST  MRA HEAD WITHOUT CONTRAST  TECHNIQUE: Multiplanar, multiecho pulse sequences of the brain and surrounding structures were obtained without intravenous contrast. Angiographic images of the head were obtained using MRA technique without contrast.  COMPARISON:  Head CT without  contrast 07/26/2013.  FINDINGS: MRI HEAD FINDINGS  Linear somewhat wedge-shaped area of restricted diffusion in the posterior right cerebellum encompassing 11 x 14 mm. No other posterior fossa restricted diffusion.  Superimposed in the high left frontal lobe there are several small subcortical white matter foci of restricted diffusion (best seen on series 6, image 13 and series 600, image 13). No other supratentorial diffusion abnormality identified.  Major intracranial vascular flow voids are preserved.  Patchy and confluent cerebral white matter T2 and FLAIR hyperintensity. T2 heterogeneity in the deep gray matter nuclei, moderate for age, mostly felt related  to perivascular spaces. Normal signal in the brainstem. Chronic lacunar infarct in the left inferior cerebellum.  No definite intracranial hemorrhage. No ventriculomegaly. No intracranial mass effect. Negative pituitary, and cervicomedullary junction. Degenerative changes in the visible cervical spine. Normal bone marrow signal. Postoperative changes to the globes. Visualized paranasal sinuses and mastoids are clear. Visible internal auditory structures appear normal.  MRA HEAD FINDINGS  Antegrade flow in the posterior circulation. The distal left vertebral artery is mildly non dominant and terminates in the left PICA which is patent. Distal right vertebral artery appears within normal limits, an the right PICA is patent. Basilar artery is patent with mild irregularity but no stenosis. Fetal type bilateral PCA origins. SCA origins are patent. There are small focal moderate to severe stenoses in the bilateral proximal PCA is (left P1 and right P2 series 505, image 10), but preserved distal PCA flow.  Antegrade flow in both ICA siphons which are mildly dolichoectatic. No ICA stenosis. Ophthalmic and posterior communicating artery origins are within normal limits. Normal carotid termini, MCA and ACA origins. Normal anterior communicating artery. Bilateral visualized ACA and MCA branches are within normal limits. Small left lenticulostriate infundibulum.  IMPRESSION: 1. Small acute right cerebellar infarct. No mass effect or hemorrhage. 2. Several small acute to subacute lacunar infarcts in the left frontal lobe white matter, favor synchronous small vessel disease rather than embolic disease. 3. Underlying chronic small vessel disease, including a small chronic left cerebellar lacunar infarct. 4. Patent distal vertebral and basilar arteries, the left vertebral terminates in PICA. 5. Moderate to severe bilateral PCA stenosis with preserved distal flow. 6. Mildly dolichoectatic anterior circulation.   Electronically  Signed   By: Lars Pinks M.D.   On: 07/27/2013 11:48   Mr Jodene Nam Head/brain Wo Cm  07/27/2013   CLINICAL DATA:  80 year old female with confusion and visual changes. Initial encounter. TIA.  EXAM: MRI HEAD WITHOUT CONTRAST  MRA HEAD WITHOUT CONTRAST  TECHNIQUE: Multiplanar, multiecho pulse sequences of the brain and surrounding structures were obtained without intravenous contrast. Angiographic images of the head were obtained using MRA technique without contrast.  COMPARISON:  Head CT without contrast 07/26/2013.  FINDINGS: MRI HEAD FINDINGS  Linear somewhat wedge-shaped area of restricted diffusion in the posterior right cerebellum encompassing 11 x 14 mm. No other posterior fossa restricted diffusion.  Superimposed in the high left frontal lobe there are several small subcortical white matter foci of restricted diffusion (best seen on series 6, image 13 and series 600, image 13). No other supratentorial diffusion abnormality identified.  Major intracranial vascular flow voids are preserved.  Patchy and confluent cerebral white matter T2 and FLAIR hyperintensity. T2 heterogeneity in the deep gray matter nuclei, moderate for age, mostly felt related to perivascular spaces. Normal signal in the brainstem. Chronic lacunar infarct in the left inferior cerebellum.  No definite intracranial hemorrhage. No ventriculomegaly. No intracranial mass effect. Negative pituitary, and  cervicomedullary junction. Degenerative changes in the visible cervical spine. Normal bone marrow signal. Postoperative changes to the globes. Visualized paranasal sinuses and mastoids are clear. Visible internal auditory structures appear normal.  MRA HEAD FINDINGS  Antegrade flow in the posterior circulation. The distal left vertebral artery is mildly non dominant and terminates in the left PICA which is patent. Distal right vertebral artery appears within normal limits, an the right PICA is patent. Basilar artery is patent with mild irregularity  but no stenosis. Fetal type bilateral PCA origins. SCA origins are patent. There are small focal moderate to severe stenoses in the bilateral proximal PCA is (left P1 and right P2 series 505, image 10), but preserved distal PCA flow.  Antegrade flow in both ICA siphons which are mildly dolichoectatic. No ICA stenosis. Ophthalmic and posterior communicating artery origins are within normal limits. Normal carotid termini, MCA and ACA origins. Normal anterior communicating artery. Bilateral visualized ACA and MCA branches are within normal limits. Small left lenticulostriate infundibulum.  IMPRESSION: 1. Small acute right cerebellar infarct. No mass effect or hemorrhage. 2. Several small acute to subacute lacunar infarcts in the left frontal lobe white matter, favor synchronous small vessel disease rather than embolic disease. 3. Underlying chronic small vessel disease, including a small chronic left cerebellar lacunar infarct. 4. Patent distal vertebral and basilar arteries, the left vertebral terminates in PICA. 5. Moderate to severe bilateral PCA stenosis with preserved distal flow. 6. Mildly dolichoectatic anterior circulation.   Electronically Signed   By: Lars Pinks M.D.   On: 07/27/2013 11:48    Microbiology: No results found for this or any previous visit (from the past 240 hour(s)).   Labs: Basic Metabolic Panel:  Recent Labs Lab 07/26/13 2047 07/26/13 2125 07/27/13 0650  NA 137 136* 139  K 4.8 4.6 3.9  CL 97 99 100  CO2 26  --  26  GLUCOSE 88 85 90  BUN 25* 31* 19  CREATININE 0.81 0.90 0.82  CALCIUM 9.1  --  8.9   Liver Function Tests:  Recent Labs Lab 07/26/13 2047 07/27/13 0650  AST 28 21  ALT 16 14  ALKPHOS 106 97  BILITOT 0.4 0.7  PROT 7.4 6.7  ALBUMIN 3.4* 3.2*   No results found for this basename: LIPASE, AMYLASE,  in the last 168 hours  Recent Labs Lab 07/27/13 0650  AMMONIA 35   CBC:  Recent Labs Lab 07/26/13 2047 07/26/13 2125 07/27/13 0650  WBC 7.3   --  7.3  NEUTROABS 4.4  --   --   HGB 13.7 15.6* 14.3  HCT 40.8 46.0 41.9  MCV 98.6  --  97.4  PLT 229  --  226   Cardiac Enzymes:  Recent Labs Lab 07/26/13 2047  TROPONINI <0.30   BNP: BNP (last 3 results) No results found for this basename: PROBNP,  in the last 8760 hours CBG:  Recent Labs Lab 07/26/13 2045  GLUCAP 98       Signed:  Nishat Livingston  [facility] 07/28/2013, 12:25 PM

## 2013-07-28 NOTE — Progress Notes (Signed)
Stroke Team Progress Note  HISTORY HPI: Caroline Myers is an 80 y.o. female history of atrial fibrillation not on anticoagulation who experienced an episode of difficulty with reading comprehension for about one hour starting at 2 PM on 07/26/2013. At around 6 PM she was noted by husband to be confused and was using cooking and eating utensils appropriately. The time she arrived in the emergency room deficits have resolved. She has no previous history of stroke nor TIA. She takes aspirin 81 mg per day. She reportedly has refused anticoagulation with Coumadin. CT scan of her head showed no acute intracranial abnormality. NIH stroke score was 0. Patient was not administerd TPA secondary to rapidly resolving deficits . She was admitted for further evaluation and treatment.  SUBJECTIVE Female at bedside. Pt with questions addressed by Dr. Leonie Man.  OBJECTIVE Most recent Vital Signs: Filed Vitals:   07/27/13 2054 07/28/13 0147 07/28/13 0522 07/28/13 1041  BP: 125/71 145/85 90/74 128/82  Pulse: 52 63 51 61  Temp: 97.5 F (36.4 C) 98.3 F (36.8 C) 97.4 F (36.3 C) 97.8 F (36.6 C)  TempSrc: Oral Oral Oral Oral  Resp: 18 20 20 20   Height:      Weight:      SpO2: 98% 92% 93% 96%   CBG (last 3)   Recent Labs  07/26/13 2045  GLUCAP 98    IV Fluid Intake:      MEDICATIONS  . apixaban  5 mg Oral BID  . multivitamin with minerals  1 tablet Oral Daily  . sertraline  37.5 mg Oral q morning - 10a  . simvastatin  20 mg Oral q1800   PRN:  acetaminophen  Diet:  Cardiac thin liquids Activity:   Bathroom privileges with assistance DVT Prophylaxis:  Lovenox 40 mg sq daily   CLINICALLY SIGNIFICANT STUDIES Basic Metabolic Panel:   Recent Labs Lab 07/26/13 2047 07/26/13 2125 07/27/13 0650  NA 137 136* 139  K 4.8 4.6 3.9  CL 97 99 100  CO2 26  --  26  GLUCOSE 88 85 90  BUN 25* 31* 19  CREATININE 0.81 0.90 0.82  CALCIUM 9.1  --  8.9   Liver Function Tests:   Recent Labs Lab  07/26/13 2047 07/27/13 0650  AST 28 21  ALT 16 14  ALKPHOS 106 97  BILITOT 0.4 0.7  PROT 7.4 6.7  ALBUMIN 3.4* 3.2*   CBC:   Recent Labs Lab 07/26/13 2047 07/26/13 2125 07/27/13 0650  WBC 7.3  --  7.3  NEUTROABS 4.4  --   --   HGB 13.7 15.6* 14.3  HCT 40.8 46.0 41.9  MCV 98.6  --  97.4  PLT 229  --  226   Coagulation:   Recent Labs Lab 07/26/13 2047  LABPROT 12.7  INR 0.97   Cardiac Enzymes:   Recent Labs Lab 07/26/13 2047  TROPONINI <0.30   Urinalysis:   Recent Labs Lab 07/26/13 2308  COLORURINE YELLOW  LABSPEC 1.009  PHURINE 6.5  GLUCOSEU NEGATIVE  HGBUR NEGATIVE  BILIRUBINUR NEGATIVE  KETONESUR NEGATIVE  PROTEINUR NEGATIVE  UROBILINOGEN 0.2  NITRITE NEGATIVE  LEUKOCYTESUR TRACE*   Lipid Panel    Component Value Date/Time   CHOL 191 07/27/2013 0650   TRIG 104 07/27/2013 0650   HDL 51 07/27/2013 0650   CHOLHDL 3.7 07/27/2013 0650   VLDL 21 07/27/2013 0650   LDLCALC 119* 07/27/2013 0650   HgbA1C  Lab Results  Component Value Date   HGBA1C 5.6 07/27/2013  Urine Drug Screen:     Component Value Date/Time   LABOPIA NONE DETECTED 07/26/2013 2308   COCAINSCRNUR NONE DETECTED 07/26/2013 2308   LABBENZ NONE DETECTED 07/26/2013 2308   AMPHETMU NONE DETECTED 07/26/2013 2308   THCU NONE DETECTED 07/26/2013 2308   LABBARB NONE DETECTED 07/26/2013 2308    Alcohol Level:   Recent Labs Lab 07/26/13 2047  ETH <11    CT of the brain  07/26/2013    Volume loss and white matter changes as above.  Evidence of prior right cerebellar lacunar infarction. No definite CT evidence of an acute intracranial abnormality.  There may be small left frontal subdural hygroma however no significant underlying mass effect.   MRI of the brain  07/27/2013   1. Small acute right cerebellar infarct. No mass effect or hemorrhage. 2. Several small acute to subacute lacunar infarcts in the left frontal lobe white matter, favor synchronous small vessel disease rather than embolic  disease. 3. Underlying chronic small vessel disease, including a small chronic left cerebellar lacunar infarct.   MRA of the brain  07/27/2013   1. Patent distal vertebral and basilar arteries, the left vertebral terminates in PICA. 2. Moderate to severe bilateral PCA stenosis with preserved distal flow. 3. Mildly dolichoectatic anterior circulation.     2D Echocardiogram  EF 55-60% with no source of embolus.   Carotid Doppler  No significant ICA stenosis noted bilaterally. Right vertebral artery flow antegrade. Lt vertebral artery not found.   CXR  07/26/2013   Enlarged cardiac silhouette is similar prior.  Interstitial prominence is at least in part chronic. Though a superimposed atypical/viral infection or interstitial edema is suspected.  Mild retrocardiac opacity; atelectasis/scarring versus mild infiltrate.  EKG  sinus bradycardia. For complete results please see formal report.   Therapy Recommendations   Physical Exam   Pleasant elderly caucasian lady not in distress.Awake alert. Afebrile. Head is nontraumatic. Neck is supple without bruit. Hearing is diminished. Cardiac exam no murmur or gallop. Lungs are clear to auscultation. Distal pulses are well felt. Neurological Exam ;  Awake  Alert oriented x 3. Normal speech and language.eye movements full without nystagmus.fundi were not visualized. Vision acuity and fields appear normal. Hearing is normal. Palatal movements are normal. Face symmetric. Tongue midline. Normal strength, tone, reflexes and coordination. Normal sensation. Gait deferred.  ASSESSMENT Ms. Caroline Myers is a 80 y.o. female presenting with transient difficulty with reading comprehension as well as transient confusion. Imaging confirms a small right cerebellar infarct - this infarct does not correlate with her symptoms. She likely has a small left brain embolic infarct that is not seen on MRI based on symptoms. Infarct felt to be embolic secondary to known atrial  fibrillation.  On aspirin 81 mg orally every day prior to admission. Now on eliquis 5 mg bid for secondary stroke prevention. Patient with resultant dizziness. Work up completed.   Hx atrial fibrillation, not on anticoagulation prior to pt choice Hyperlipidemia, LDL 119, on no statin PTA, now on zocor 20 mg daily, goal LDL < 100 (< 70 for diabetics)  Aortic stenosis  Anxiety. On [facility] day # 2  TREATMENT/PLAN  Continue eliquis 5 mg po bid for secondary stroke prevention.  Therapy evals  Continue statin at discharge  No further stroke workup indicated.  Disposition per therapy recommendations Patient has a 10-15% risk of having another stroke over the next year, the highest risk is within 2 weeks of the most recent stroke/TIA (risk of having  a stroke following a stroke or TIA is the same). Ongoing risk factor control by Primary Care Physician Stroke Service will sign off. Please call should any needs arise. Follow up with Dr. Leonie Man, [facility], in 2 months.  Burnetta Sabin, MSN, RN, ANVP-BC, ANP-BC, [facility] Pager: [phone] 07/28/2013 11:04 AM  I have personally obtained a history, examined the patient, evaluated imaging results, and formulated the assessment and plan of care. I agree with the above.  Antony Contras, MD

## 2013-08-02 DIAGNOSIS — I679 Cerebrovascular disease, unspecified: Secondary | ICD-10-CM | POA: Diagnosis not present

## 2013-08-02 DIAGNOSIS — E7800 Pure hypercholesterolemia, unspecified: Secondary | ICD-10-CM | POA: Diagnosis not present

## 2013-08-19 ENCOUNTER — Encounter: Payer: Self-pay | Admitting: Neurology

## 2013-08-23 ENCOUNTER — Telehealth: Payer: Self-pay | Admitting: Neurology

## 2013-08-23 NOTE — Telephone Encounter (Signed)
Patient calling to state she would like to be seen sooner than scheduled 11/16/13 appointment with Dr. Leonie Man. Patient states her left ankle and foot has been swollen for the past 2 weeks and she is concerned. Please call.

## 2013-08-23 NOTE — Telephone Encounter (Signed)
ok 

## 2013-08-23 NOTE — Telephone Encounter (Signed)
Appt made for 09-24-13 with L Lam, NP at 1030.  She is aware of Dr. Leonie Man not on site, but available via phone.  Has had 2 wk L ankle / now to foot swelling.  She noted no bruising or trauma .   I taking Eliquis.  I told her I would mention this to Dr. Leonie Man, re: med, but would have Dr. Harrington Challenger evaluate (pcp).

## 2013-08-31 DIAGNOSIS — R197 Diarrhea, unspecified: Secondary | ICD-10-CM | POA: Diagnosis not present

## 2013-08-31 DIAGNOSIS — I4891 Unspecified atrial fibrillation: Secondary | ICD-10-CM | POA: Diagnosis not present

## 2013-09-24 ENCOUNTER — Encounter: Payer: Self-pay | Admitting: Nurse Practitioner

## 2013-09-24 ENCOUNTER — Ambulatory Visit (INDEPENDENT_AMBULATORY_CARE_PROVIDER_SITE_OTHER): Payer: Medicare Other | Admitting: Nurse Practitioner

## 2013-09-24 ENCOUNTER — Encounter (INDEPENDENT_AMBULATORY_CARE_PROVIDER_SITE_OTHER): Payer: Self-pay

## 2013-09-24 VITALS — BP 125/75 | HR 56 | Ht 64.0 in | Wt 144.0 lb

## 2013-09-24 DIAGNOSIS — I6350 Cerebral infarction due to unspecified occlusion or stenosis of unspecified cerebral artery: Secondary | ICD-10-CM

## 2013-09-24 DIAGNOSIS — I4891 Unspecified atrial fibrillation: Secondary | ICD-10-CM | POA: Diagnosis not present

## 2013-09-24 DIAGNOSIS — I639 Cerebral infarction, unspecified: Secondary | ICD-10-CM

## 2013-09-24 NOTE — Progress Notes (Signed)
PATIENT: Caroline Myers DOB: [birth year]  REASON FOR VISIT: hospital stroke follow up HISTORY FROM: patient  HISTORY OF PRESENT ILLNESS: Caroline Myers is an 80 y.o. female history of atrial fibrillation not on anticoagulation who experienced an episode of difficulty with reading comprehension for about one hour starting at 2 PM on 07/26/2013. At around 6 PM she was noted by husband to be confused and was using cooking and eating utensils inappropriately. The time she arrived in the emergency room deficits have resolved. She has no previous history of stroke nor TIA. She takes aspirin 81 mg per day. She reportedly has refused anticoagulation with Coumadin. CT scan of her head showed no acute intracranial abnormality. NIH stroke score was 0. Patient was not administerd TPA secondary to rapidly resolving deficits . She was admitted for further evaluation and treatment. MRI of the brain showed small acute right cerebellar infarct. No mass effect or hemorrhage.  Several small acute to subacute lacunar infarcts in the left frontal lobe white matter, favor synchronous small vessel disease rather than embolic disease.  Underlying chronic small vessel disease, including a small chronic left cerebellar lacunar infarct.  MRA of the brain showed patent distal vertebral and basilar arteries, the left vertebral terminates in PICA. Moderate to severe bilateral PCA stenosis with preserved distal flow. Mildly dolichoectatic anterior circulation. 2D Echocardiogram showed an EF 55-60% with no source of embolus.  Upon discharge she was started on Eliquis daily, and she has tolerated this well.  They are unhappy about the cost.  She had no lasting deficits and has no repeat symptoms.  REVIEW OF SYSTEMS: Full 14 system review of systems performed and notable only for: No complaints.  ALLERGIES: Allergies  Allergen Reactions  . Tape Other (See Comments)    Rips skin    HOME MEDICATIONS: Outpatient  Prescriptions Prior to Visit  Medication Sig Dispense Refill  . acetaminophen (TYLENOL) 325 MG tablet Take 325-650 mg by mouth every 6 (six) hours as needed. For pain      . apixaban (ELIQUIS) 5 MG TABS tablet Take 1 tablet (5 mg total) by mouth 2 (two) times daily.  60 tablet  0  . Multiple Vitamin (MULTIVITAMIN WITH MINERALS) TABS tablet Take 1 tablet by mouth daily.      . sertraline (ZOLOFT) 25 MG tablet Take 37.5 mg by mouth every morning.      . simvastatin (ZOCOR) 20 MG tablet Take 1 tablet (20 mg total) by mouth daily at 6 PM.  30 tablet  0   No facility-administered medications prior to visit.     PHYSICAL EXAM  Filed Vitals:   09/24/13 1044  BP: 125/75  Pulse: 56  Height: 5\' 4"  (1.626 m)  Weight: 144 lb (65.318 kg)   Body mass index is 24.71 kg/(m^2).  Generalized: Well developed, in no acute distress  Head: normocephalic and atraumatic. Oropharynx benign  Neck: Supple, no carotid bruits  Cardiac: Irrgular rate and rhythm, systolic murmur 2/6 Musculoskeletal: No deformity   Neurological examination  Mentation: Alert oriented to time, place, history taking. Follows all commands speech and language fluent Cranial nerve II-XII: Fundoscopic exam not done. Pupils were equal round reactive to light extraocular movements were full, visual field were full on confrontational test. Facial sensation and strength were normal. hearing was intact to finger rubbing bilaterally. Uvula tongue midline. head turning and shoulder shrug and were normal and symmetric.Tongue protrusion into cheek strength was normal. Motor: The motor testing reveals 5 over 5  strength of all 4 extremities. Good symmetric motor tone is noted throughout.  Sensory: Sensory testing is intact to pinprick, soft touch on all 4 extremities. No evidence of extinction is noted.  Coordination: Cerebellar testing reveals good finger-nose-finger and heel-to-shin bilaterally.  Gait and station: Gait is normal. Tandem gait is  normal. Romberg is negative. No drift is seen.  Reflexes: Deep tendon reflexes are symmetric and normal bilaterally. Toes are downgoing bilaterally.   DIAGNOSTIC DATA (LABS, IMAGING, TESTING) - I reviewed patient records, labs, notes, testing and imaging myself where available.  Lab Results  Component Value Date   HGBA1C 5.6 07/27/2013   ASSESSMENT AND PLAN 80 y.o. year old female  has a past medical history of Allergy; Cancer; Asthma (2000); Shoulder pain, left (05/2010); Paroxysmal atrial fibrillation; Aortic stenosis; Anxiety; Shortness of breath; GERD (gastroesophageal reflux disease); and Arthritis here for follow up of cerebellar infarct on 07/26/13.  Infarct due to known atrial fibrillation.  Patient was on only aspirin previously.  She has no residual deficits.  PLAN: Continue Eliquis  for atrial fibrillation and secondary stroke prevention and maintain strict control of hypertension with blood pressure goal below 130/90,  And  LDL cholesterol goal below 100 mg/dL.  Followup in the future with me in 6 months.  Philmore Pali, MSN, NP-C 09/24/2013, 10:59 AM [facility] [address] [place], [place] [address] [phone]  Note: This document was prepared with digital dictation and possible smart phrase technology. Any transcriptional errors that result from this process are unintentional.

## 2013-09-24 NOTE — Patient Instructions (Signed)
PLAN: Continue Eliquis  for atrial fibrillation and secondary stroke prevention and maintain strict control of hypertension with blood pressure goal below 130/90,  And  LDL cholesterol goal below 100 mg/dL.  Followup in the future with me in 6 months.  STROKE/TIA INSTRUCTIONS SMOKING Cigarette smoking nearly doubles your risk of having a stroke & is the single most alterable risk factor  If you smoke or have smoked in the last 12 months, you are advised to quit smoking for your health.  Most of the excess cardiovascular risk related to smoking disappears within a year of stopping.  Ask you doctor about anti-smoking medications  [place] Quit Line: 1-800-QUIT NOW  Free Smoking Cessation Classes [phone]  CHOLESTEROL Know your levels; limit fat & cholesterol in your diet  Lab Results  Component Value Date   CHOL 191 07/27/2013   HDL 51 07/27/2013   LDLCALC 119* 07/27/2013   TRIG 104 07/27/2013   CHOLHDL 3.7 07/27/2013      Many patients benefit from treatment even if their cholesterol is at goal.  Goal: Total Cholesterol less than 160  Goal:  LDL less than 100  Goal:  HDL greater than 40  Goal:  Triglycerides less than 150  BLOOD PRESSURE American Stroke Association blood pressure target is less that 120/80 mm/Hg  Your discharge blood pressure is:  BP: 125/75 mmHg  Monitor your blood pressure  Limit your salt and alcohol intake  Many individuals will require more than one medication for high blood pressure  DIABETES (A1c is a blood sugar average for last 3 months) Goal A1c is under 7% (A1c is blood sugar average for last 3 months)  Diabetes: No known diagnosis of diabetes    Lab Results  Component Value Date   HGBA1C 5.6 07/27/2013    Your A1c can be lowered with medications, healthy diet, and exercise.  Check your blood sugar as directed by your physician  Call your physician if you experience unexplained or low blood sugars.  PHYSICAL ACTIVITY/REHABILITATION Goal is 30  minutes at least 4 days per week    Activity decreases your risk of heart attack and stroke and makes your heart stronger.  It helps control your weight and blood pressure; helps you relax and can improve your mood.  Participate in a regular exercise program.  Talk with your doctor about the best form of exercise for you (dancing, walking, swimming, cycling).  DIET/WEIGHT Goal is to maintain a healthy weight  Your height is:  Height: 5\' 4"  (162.6 cm) Your current weight is: Weight: 144 lb (65.318 kg) Your body Mass Index (BMI) is:  BMI (Calculated): 24.8  Following the type of diet specifically designed for you will help prevent another stroke.  Your goal Body Mass Index (BMI) is 19-24.  Healthy food habits can help reduce 3 risk factors for stroke:  High cholesterol, hypertension, and excess weight.

## 2013-10-18 DIAGNOSIS — S8000XA Contusion of unspecified knee, initial encounter: Secondary | ICD-10-CM | POA: Diagnosis not present

## 2013-10-21 DIAGNOSIS — I4891 Unspecified atrial fibrillation: Secondary | ICD-10-CM | POA: Diagnosis not present

## 2013-10-21 DIAGNOSIS — H00019 Hordeolum externum unspecified eye, unspecified eyelid: Secondary | ICD-10-CM | POA: Diagnosis not present

## 2013-10-21 DIAGNOSIS — K589 Irritable bowel syndrome without diarrhea: Secondary | ICD-10-CM | POA: Diagnosis not present

## 2013-10-25 DIAGNOSIS — M79609 Pain in unspecified limb: Secondary | ICD-10-CM | POA: Diagnosis not present

## 2013-10-25 DIAGNOSIS — B351 Tinea unguium: Secondary | ICD-10-CM | POA: Diagnosis not present

## 2013-10-28 ENCOUNTER — Encounter: Payer: Self-pay | Admitting: Interventional Cardiology

## 2013-10-28 ENCOUNTER — Ambulatory Visit (INDEPENDENT_AMBULATORY_CARE_PROVIDER_SITE_OTHER): Payer: Medicare Other | Admitting: Interventional Cardiology

## 2013-10-28 VITALS — BP 110/60 | HR 70 | Ht 65.5 in | Wt 141.0 lb

## 2013-10-28 DIAGNOSIS — I6350 Cerebral infarction due to unspecified occlusion or stenosis of unspecified cerebral artery: Secondary | ICD-10-CM

## 2013-10-28 DIAGNOSIS — I359 Nonrheumatic aortic valve disorder, unspecified: Secondary | ICD-10-CM | POA: Diagnosis not present

## 2013-10-28 DIAGNOSIS — G459 Transient cerebral ischemic attack, unspecified: Secondary | ICD-10-CM

## 2013-10-28 DIAGNOSIS — I4891 Unspecified atrial fibrillation: Secondary | ICD-10-CM | POA: Diagnosis not present

## 2013-10-28 DIAGNOSIS — R9430 Abnormal result of cardiovascular function study, unspecified: Secondary | ICD-10-CM | POA: Insufficient documentation

## 2013-10-28 DIAGNOSIS — I350 Nonrheumatic aortic (valve) stenosis: Secondary | ICD-10-CM | POA: Insufficient documentation

## 2013-10-28 NOTE — Patient Instructions (Addendum)
Your physician recommends that you continue on your current medications as directed. Please refer to the Current Medication list given to you today.  Check with your insurance company to see if Xarelto or Savaysa would be cheaper for you and let us know.   Your physician recommends that you return for lab work on 01/24/14 for cbc and bmet.  Your physician wants you to follow-up in: 6 months with Dr. Irish Lack.  You will receive a reminder letter in the mail two months in advance. If you don't receive a letter, please call our office to schedule the follow-up appointment.

## 2013-10-28 NOTE — Progress Notes (Signed)
Patient ID: Caroline Myers, female   DOB: 07-13-1923, 80 y.o.   MRN: [id number]    [place], [address] [place], [place]  [address] Phone: [phone] Fax:  [phone]  Date:  10/28/2013   ID:  Caroline Myers, DOB [birth year], MRN [id number]  PCP:   Melinda Crutch, MD      History of Present Illness: Caroline Myers is a 80 y.o. female who has AFib. She did not want to take coumadin. SHe took a baby aspirin. Indigestion resolved.  She had back surgery and then a TIA in Jan 15.  She was discharged from the hospital on Eliquis.  It is very expensive. Atrial Fibrillation F/U:  cough. no bleeding problems. Denies : Chest pain.  Dizziness.  Leg edema.  Orthopnea.  Palpitations.  Syncope.   She reports some DOE, usually ok when she walks.  Echo showed normal LV with normal valve function.  Mild carotid disease.  Mild AS.       Wt Readings from Last 3 Encounters:  10/28/13 141 lb (63.957 kg)  09/24/13 144 lb (65.318 kg)  07/27/13 141 lb 8.6 oz (64.2 kg)     Past Medical History  Diagnosis Date  . Allergy     allergic rhinitis  . Cancer     history of skin cancer of the nose  . Asthma 2000    mild. tried Advair after bronchospasm after exposure to cats  . Shoulder pain, left 05/2010    with rotator cuff tear--Dr Alfonso Ramus  . Paroxysmal atrial fibrillation     pt does not want anticoagulation  . Aortic stenosis     EF normal.  Mild aortic stenosis. Mild to moderate aortic regurgitation  . Anxiety   . Shortness of breath     occ  . GERD (gastroesophageal reflux disease)     occ  . Arthritis     Current Outpatient Prescriptions  Medication Sig Dispense Refill  . acetaminophen (TYLENOL) 325 MG tablet Take 325-650 mg by mouth every 6 (six) hours as needed. For pain      . apixaban (ELIQUIS) 5 MG TABS tablet Take 1 tablet (5 mg total) by mouth 2 (two) times daily.  60 tablet  0  . Multiple Vitamin (MULTIVITAMIN WITH MINERALS) TABS tablet Take 1 tablet by mouth  daily.      . sertraline (ZOLOFT) 25 MG tablet Take 37.5 mg by mouth every morning.       No current facility-administered medications for this visit.    Allergies:    Allergies  Allergen Reactions  . Tape Other (See Comments)    Rips skin    Social History:  The patient  reports that she has never smoked. She has never used smokeless tobacco. She reports that she drinks alcohol. She reports that she does not use illicit drugs.   Family History:  The patient's family history is not on file.   ROS:  Please see the history of present illness.  No nausea, vomiting.  No fevers, chills.  No focal weakness.  No dysuria. Occasional DOE   All other systems reviewed and negative.   PHYSICAL EXAM: VS:  BP 110/60  Pulse 70  Ht 5' 5.5" (1.664 m)  Wt 141 lb (63.957 kg)  BMI 23.10 kg/m2 Well nourished, well developed, in no acute distress HEENT: normal Neck: no JVD, no carotid bruits Cardiac:  normal S1, S2; RRR;  Lungs:  clear to auscultation bilaterally, no wheezing, rhonchi or rales  Abd: soft, nontender, no hepatomegaly Ext: no edema, tr DP pulses bilaterally Skin: warm and dry Neuro:   no focal abnormalities noted  EKG:  SB, NSST     ASSESSMENT AND PLAN:  AFib...paroxysmal with TIA/stroke. Will use Eliquis for stroke prevention.   Did not tolerate verapamil, or metoprolol. Some itching with her current meds.   Sinus bradycardia- when in NSR in Jan 2015.  HR 46.    Abnormal ECHO: LVH, mild AS.  Watch for sx of angina or syncope.   Signed, Mina Marble, MD, [facility] 10/28/2013 2:14 PM

## 2013-11-04 ENCOUNTER — Ambulatory Visit: Payer: Self-pay | Admitting: Neurology

## 2013-11-16 ENCOUNTER — Ambulatory Visit: Payer: Self-pay | Admitting: Neurology

## 2013-11-30 ENCOUNTER — Telehealth: Payer: Self-pay | Admitting: Neurology

## 2013-11-30 ENCOUNTER — Ambulatory Visit (HOSPITAL_COMMUNITY)
Admission: RE | Admit: 2013-11-30 | Discharge: 2013-11-30 | Disposition: A | Discharge status: Home / Self Care | Payer: Medicare Other | Source: Ambulatory Visit | Attending: Nurse Practitioner | Admitting: Nurse Practitioner

## 2013-11-30 ENCOUNTER — Other Ambulatory Visit: Payer: Self-pay | Admitting: Nurse Practitioner

## 2013-11-30 ENCOUNTER — Encounter (HOSPITAL_COMMUNITY): Payer: Self-pay

## 2013-11-30 DIAGNOSIS — M436 Torticollis: Secondary | ICD-10-CM

## 2013-11-30 DIAGNOSIS — R519 Headache, unspecified: Secondary | ICD-10-CM

## 2013-11-30 DIAGNOSIS — G319 Degenerative disease of nervous system, unspecified: Secondary | ICD-10-CM | POA: Insufficient documentation

## 2013-11-30 DIAGNOSIS — G9389 Other specified disorders of brain: Secondary | ICD-10-CM | POA: Insufficient documentation

## 2013-11-30 DIAGNOSIS — R51 Headache: Secondary | ICD-10-CM | POA: Diagnosis not present

## 2013-11-30 DIAGNOSIS — H53489 Generalized contraction of visual field, unspecified eye: Secondary | ICD-10-CM

## 2013-11-30 NOTE — Telephone Encounter (Signed)
Patient calling to state that she would like to speak with Jeani Hawking regarding her stiff neck and tunnel vision that she has sometimes, as well as headaches that she gets in the back of her head. Patient is unsure if she needs to schedule an appointment to discuss this. Please return call and advise.

## 2013-12-01 NOTE — Progress Notes (Signed)
Quick Note:  Spoke to patient and she had been notified that the CT did not show any signs of bleeding. She is in no pain right now, her neck is less stiff. I told her to call if she had any more issues and we will get her in sooner than September if needed. ______

## 2013-12-01 NOTE — Telephone Encounter (Signed)
Patient came to office at the end of the day, (Tuesday) with spouse and daughter.  She was concerned because she had a headache at the back of her head and tunnel vision for 3 days, Thurs, Fri and Saturday.  Now she has a stiff neck and feels like there are clamps on her head.  Spoke to [place] and Dr. Leonie Man, order was for a STAT CT of head at [facility], with results to be called into on call doctor, Dr. Krista Blue.  Dr. Krista Blue made aware of the order.  Spoke to CT department at [facility] to make them aware of order.  Then notified patient to go to [facility] stat for CT of head.  Patient expressed understanding.

## 2013-12-21 ENCOUNTER — Emergency Department (HOSPITAL_COMMUNITY)
Admission: EM | Admit: 2013-12-21 | Discharge: 2013-12-21 | Disposition: A | Discharge status: Home / Self Care | Payer: Medicare Other | Attending: Emergency Medicine | Admitting: Emergency Medicine

## 2013-12-21 ENCOUNTER — Emergency Department (HOSPITAL_COMMUNITY): Payer: Medicare Other

## 2013-12-21 ENCOUNTER — Encounter (HOSPITAL_COMMUNITY): Payer: Self-pay | Admitting: Emergency Medicine

## 2013-12-21 DIAGNOSIS — R000 Tachycardia, unspecified: Secondary | ICD-10-CM | POA: Insufficient documentation

## 2013-12-21 DIAGNOSIS — S60222A Contusion of left hand, initial encounter: Secondary | ICD-10-CM

## 2013-12-21 DIAGNOSIS — I4891 Unspecified atrial fibrillation: Secondary | ICD-10-CM | POA: Insufficient documentation

## 2013-12-21 DIAGNOSIS — IMO0002 Reserved for concepts with insufficient information to code with codable children: Secondary | ICD-10-CM | POA: Insufficient documentation

## 2013-12-21 DIAGNOSIS — K219 Gastro-esophageal reflux disease without esophagitis: Secondary | ICD-10-CM | POA: Diagnosis not present

## 2013-12-21 DIAGNOSIS — S1093XA Contusion of unspecified part of neck, initial encounter: Secondary | ICD-10-CM

## 2013-12-21 DIAGNOSIS — Z79899 Other long term (current) drug therapy: Secondary | ICD-10-CM | POA: Insufficient documentation

## 2013-12-21 DIAGNOSIS — F411 Generalized anxiety disorder: Secondary | ICD-10-CM | POA: Diagnosis not present

## 2013-12-21 DIAGNOSIS — S8000XA Contusion of unspecified knee, initial encounter: Secondary | ICD-10-CM | POA: Diagnosis not present

## 2013-12-21 DIAGNOSIS — S60229A Contusion of unspecified hand, initial encounter: Secondary | ICD-10-CM | POA: Diagnosis not present

## 2013-12-21 DIAGNOSIS — S0993XA Unspecified injury of face, initial encounter: Secondary | ICD-10-CM | POA: Diagnosis not present

## 2013-12-21 DIAGNOSIS — S6990XA Unspecified injury of unspecified wrist, hand and finger(s), initial encounter: Secondary | ICD-10-CM | POA: Insufficient documentation

## 2013-12-21 DIAGNOSIS — W19XXXA Unspecified fall, initial encounter: Secondary | ICD-10-CM

## 2013-12-21 DIAGNOSIS — Z85828 Personal history of other malignant neoplasm of skin: Secondary | ICD-10-CM | POA: Diagnosis not present

## 2013-12-21 DIAGNOSIS — Z7902 Long term (current) use of antithrombotics/antiplatelets: Secondary | ICD-10-CM | POA: Diagnosis not present

## 2013-12-21 DIAGNOSIS — S0003XA Contusion of scalp, initial encounter: Secondary | ICD-10-CM | POA: Diagnosis not present

## 2013-12-21 DIAGNOSIS — R51 Headache: Secondary | ICD-10-CM | POA: Diagnosis not present

## 2013-12-21 DIAGNOSIS — Y9389 Activity, other specified: Secondary | ICD-10-CM | POA: Insufficient documentation

## 2013-12-21 DIAGNOSIS — S8002XA Contusion of left knee, initial encounter: Secondary | ICD-10-CM

## 2013-12-21 DIAGNOSIS — S0180XA Unspecified open wound of other part of head, initial encounter: Secondary | ICD-10-CM | POA: Diagnosis not present

## 2013-12-21 DIAGNOSIS — S81009A Unspecified open wound, unspecified knee, initial encounter: Secondary | ICD-10-CM | POA: Diagnosis not present

## 2013-12-21 DIAGNOSIS — Y9289 Other specified places as the place of occurrence of the external cause: Secondary | ICD-10-CM | POA: Insufficient documentation

## 2013-12-21 DIAGNOSIS — S6000XA Contusion of unspecified finger without damage to nail, initial encounter: Secondary | ICD-10-CM | POA: Diagnosis not present

## 2013-12-21 DIAGNOSIS — S6980XA Other specified injuries of unspecified wrist, hand and finger(s), initial encounter: Secondary | ICD-10-CM | POA: Diagnosis not present

## 2013-12-21 DIAGNOSIS — J45909 Unspecified asthma, uncomplicated: Secondary | ICD-10-CM | POA: Diagnosis not present

## 2013-12-21 DIAGNOSIS — Z8739 Personal history of other diseases of the musculoskeletal system and connective tissue: Secondary | ICD-10-CM | POA: Insufficient documentation

## 2013-12-21 DIAGNOSIS — W010XXA Fall on same level from slipping, tripping and stumbling without subsequent striking against object, initial encounter: Secondary | ICD-10-CM | POA: Insufficient documentation

## 2013-12-21 DIAGNOSIS — S0083XA Contusion of other part of head, initial encounter: Secondary | ICD-10-CM | POA: Insufficient documentation

## 2013-12-21 DIAGNOSIS — M542 Cervicalgia: Secondary | ICD-10-CM | POA: Diagnosis not present

## 2013-12-21 DIAGNOSIS — S0990XA Unspecified injury of head, initial encounter: Secondary | ICD-10-CM | POA: Diagnosis not present

## 2013-12-21 NOTE — ED Notes (Signed)
Pt states she tripped and fell, striking left knee and forehead. No LOC. Pt takes eliquis for a-fib. Laceration of left forehead and abrasion to left knee. Hx of CVA July 26 2013.

## 2013-12-21 NOTE — ED Notes (Signed)
Pt returned from X-ray.  

## 2013-12-21 NOTE — Discharge Instructions (Signed)
Contusion °A contusion is a deep bruise. Contusions are the result of an injury that caused bleeding under the skin. The contusion may turn blue, purple, or yellow. Minor injuries will give you a painless contusion, but more severe contusions may stay painful and swollen for a few weeks.  °CAUSES  °A contusion is usually caused by a blow, trauma, or direct force to an area of the body. °SYMPTOMS  °· Swelling and redness of the injured area. °· Bruising of the injured area. °· Tenderness and soreness of the injured area. °· Pain. °DIAGNOSIS  °The diagnosis can be made by taking a history and physical exam. An X-ray, CT scan, or MRI may be needed to determine if there were any associated injuries, such as fractures. °TREATMENT  °Specific treatment will depend on what area of the body was injured. In general, the best treatment for a contusion is resting, icing, elevating, and applying cold compresses to the injured area. Over-the-counter medicines may also be recommended for pain control. Ask your caregiver what the best treatment is for your contusion. °HOME CARE INSTRUCTIONS  °· Put ice on the injured area. °¨ Put ice in a plastic bag. °¨ Place a towel between your skin and the bag. °¨ Leave the ice on for 15-20 minutes, 3-4 times a day, or as directed by your health care provider. °· Only take over-the-counter or prescription medicines for pain, discomfort, or fever as directed by your caregiver. Your caregiver may recommend avoiding anti-inflammatory medicines (aspirin, ibuprofen, and naproxen) for 48 hours because these medicines may increase bruising. °· Rest the injured area. °· If possible, elevate the injured area to reduce swelling. °SEEK IMMEDIATE MEDICAL CARE IF:  °· You have increased bruising or swelling. °· You have pain that is getting worse. °· Your swelling or pain is not relieved with medicines. °MAKE SURE YOU:  °· Understand these instructions. °· Will watch your condition. °· Will get help right  away if you are not doing well or get worse. °Document Released: 03/27/2005 Document Revised: 06/22/2013 Document Reviewed: 04/22/2011 °[employer]® Patient Information ©2015 [employer]. This information is not intended to replace advice given to you by your health care provider. Make sure you discuss any questions you have with your health care provider. ° °Facial or Scalp Contusion °A facial or scalp contusion is a deep bruise on the face or head. Injuries to the face and head generally cause a lot of swelling, especially around the eyes. Contusions are the result of an injury that caused bleeding under the skin. The contusion may turn blue, purple, or yellow. Minor injuries will give you a painless contusion, but more severe contusions may stay painful and swollen for a few weeks.  °CAUSES  °A facial or scalp contusion is caused by a blunt injury or trauma to the face or head area.  °SIGNS AND SYMPTOMS  °· Swelling of the injured area.   °· Discoloration of the injured area.   °· Tenderness, soreness, or pain in the injured area.   °DIAGNOSIS  °The diagnosis can be made by taking a medical history and doing a physical exam. An X-ray exam, CT scan, or MRI may be needed to determine if there are any associated injuries, such as broken bones (fractures). °TREATMENT  °Often, the best treatment for a facial or scalp contusion is applying cold compresses to the injured area. Over-the-counter medicines may also be recommended for pain control.  °HOME CARE INSTRUCTIONS  °· Only take over-the-counter or prescription medicines as directed by   your health care provider.   °· Apply ice to the injured area.   °¨ Put ice in a plastic bag.   °¨ Place a towel between your skin and the bag.   °¨ Leave the ice on for 20 minutes, 2-3 times a day.   °SEEK MEDICAL CARE IF: °· You have bite problems.   °· You have pain with chewing.   °· You are concerned about facial defects. °SEEK IMMEDIATE MEDICAL CARE IF: °· You have severe pain or  a headache that is not relieved by medicine.   °· You have unusual sleepiness, confusion, or personality changes.   °· You throw up (vomit).   °· You have a persistent nosebleed.   °· You have double vision or blurred vision.   °· You have fluid drainage from your nose or ear.   °· You have difficulty walking or using your arms or legs.   °MAKE SURE YOU:  °· Understand these instructions. °· Will watch your condition. °· Will get help right away if you are not doing well or get worse. °Document Released: 07/25/2004 Document Revised: 04/07/2013 Document Reviewed: 01/28/2013 °[employer]® Patient Information ©2015 [employer]. This information is not intended to replace advice given to you by your health care provider. Make sure you discuss any questions you have with your health care provider. ° °

## 2013-12-21 NOTE — ED Provider Notes (Signed)
CSN: [id number]     Arrival date & time 12/21/13  1031 History   First MD Initiated Contact with Patient 12/21/13 1042     Chief Complaint  Patient presents with  . Fall    takes blood thinner     (Consider location/radiation/quality/duration/timing/severity/associated sxs/prior Treatment) Patient is a 80 y.o. female presenting with fall. The history is provided by the patient.  Fall This is a new problem. Associated symptoms include headaches. Pertinent negatives include no chest pain, no abdominal pain and no shortness of breath.   patient tripped and fell. She landed onto her left side. No loss of consciousness. She has a mild headache. She has pain in her left knee and left little finger also. She is on Eliquis for atrial fibrillation. She also has mild neck pain. No numbness weakness. No confusion. No difficulty with vision. No chest or abdominal pain. Past Medical History  Diagnosis Date  . Allergy     allergic rhinitis  . Cancer     history of skin cancer of the nose  . Asthma 2000    mild. tried Advair after bronchospasm after exposure to cats  . Shoulder pain, left 05/2010    with rotator cuff tear--Dr Alfonso Ramus  . Paroxysmal atrial fibrillation     pt does not want anticoagulation  . Aortic stenosis     EF normal.  Mild aortic stenosis. Mild to moderate aortic regurgitation  . Anxiety   . Shortness of breath     occ  . GERD (gastroesophageal reflux disease)     occ  . Arthritis    Past Surgical History  Procedure Laterality Date  . Hernia repair  2003  . Acromioplasty  2000    right shoulder, arthroscopic  . Lumbar laminectomy/decompression microdiscectomy  04/01/2012    Procedure: LUMBAR LAMINECTOMY/DECOMPRESSION MICRODISCECTOMY 1 LEVEL;  Surgeon: Eustace Moore, MD;  Location: [facility];  Service: Neurosurgery;  Laterality: Right;  Right Lumbar four-five extraforaminal microdiskectomy    History reviewed. No pertinent family history. History  Substance Use  Topics  . Smoking status: Never Smoker   . Smokeless tobacco: Never Used     Comment: occ wine  . Alcohol Use: Yes   OB History   Grav Para Term Preterm Abortions TAB SAB Ect Mult Living                 Review of Systems  Constitutional: Negative for activity change and appetite change.  Eyes: Negative for pain.  Respiratory: Negative for chest tightness and shortness of breath.   Cardiovascular: Negative for chest pain and leg swelling.  Gastrointestinal: Negative for nausea, vomiting, abdominal pain and diarrhea.  Genitourinary: Negative for flank pain.  Musculoskeletal: Positive for neck pain. Negative for back pain and neck stiffness.  Skin: Positive for wound. Negative for rash.  Neurological: Positive for headaches. Negative for weakness and numbness.  Psychiatric/Behavioral: Negative for behavioral problems.      Allergies  Tape  Home Medications   Prior to Admission medications   Medication Sig Start Date End Date Taking? Authorizing Provider  apixaban (ELIQUIS) 5 MG TABS tablet Take 5 mg by mouth 2 (two) times daily.   Yes Historical Provider, MD  Multiple Vitamin (MULTIVITAMIN WITH MINERALS) TABS tablet Take 1 tablet by mouth every morning.    Yes Historical Provider, MD  sertraline (ZOLOFT) 25 MG tablet Take 37.5 mg by mouth every morning.   Yes Historical Provider, MD   BP 134/86  Pulse 68  Temp(Src)  98.1 F (36.7 C) (Oral)  Resp 17  SpO2 96% Physical Exam  Constitutional: She is oriented to person, place, and time. She appears well-developed and well-nourished.  HENT:  Hematoma abrasion to left forehead near left eye superiorly. No crepitance, there is some mild tenderness. There is a half centimeter shallow laceration below the left eye laterally. Extraocular movements intact. Face stable  Eyes: EOM are normal. Pupils are equal, round, and reactive to light.  Neck: Normal range of motion. Neck supple.  Mild pain to neck with right rotation. No numbness  or weakness. No crepitance or deformity. No step-off.  Cardiovascular:  Mild tachycardia.  Pulmonary/Chest: Effort normal and breath sounds normal.  Abdominal: Soft. There is no tenderness.  Musculoskeletal: She exhibits tenderness.  Tenderness to left hand over fifth MCP joint. Flexion extension intact at the IP and PIP joint. Sensation grossly over middle finger. Mild swelling over fifth MCP joint. No tenderness wrist. No tenderness of left elbow. There is mild abrasion to lateral left shoulder, but no tenderness and range of motion is intact. There is abrasion the anterior left knee with mild tenderness. Flexion extension intact. Knee appears stable.  Neurological: She is alert and oriented to person, place, and time.  Skin: Skin is warm. No rash noted.    ED Course  Procedures (including critical care time) Labs Review Labs Reviewed - No data to display  Imaging Review Ct Head Wo Contrast  12/21/2013   CLINICAL DATA:  80 year old female status post fall with laceration, headache and neck pain. History of stroke. Initial encounter.  EXAM: CT HEAD WITHOUT CONTRAST  CT MAXILLOFACIAL WITHOUT CONTRAST  CT CERVICAL SPINE WITHOUT CONTRAST  TECHNIQUE: Multidetector CT imaging of the head, cervical spine, and maxillofacial structures were performed using the standard protocol without intravenous contrast. Multiplanar CT image reconstructions of the cervical spine and maxillofacial structures were also generated.  COMPARISON:  Head CT 11/30/2013 and earlier.  FINDINGS: CT HEAD FINDINGS  Visualized paranasal sinuses and mastoids are clear. Small left of midline forehead scalp lipoma unchanged. No scalp hematoma identified. Calvarium intact.  Calcified atherosclerosis at the skull base. Stable cerebral volume. Stable asymmetric extra-axial CSF along the left frontal convexity. No midline shift, mass effect, or evidence of intracranial mass lesion. No acute intracranial hemorrhage identified. No  ventriculomegaly. Small chronic lacunar infarct in the left cerebellum is stable. Mild to moderate for age patchy white matter hypodensity is stable. No evidence of cortically based acute infarction identified.  CT MAXILLOFACIAL FINDINGS  Postoperative changes to the globes. Mild left periorbital superficial soft tissue thickening and swelling compatible with superficial soft tissue injury. Bilateral orbital walls remain intact. Paranasal sinuses are clear except for minor left maxillary alveolar recess mucosal thickening. Mandible appears intact. No acute facial fracture identified.  Visualized deep soft tissue spaces of the face are within normal limits.  CT CERVICAL SPINE FINDINGS  Reversal of cervical lordosis with diffusely advanced disc space loss and endplate degeneration. Visualized skull base is intact. No atlanto-occipital dissociation. Cervicothoracic junction alignment is within normal limits. Bilateral posterior element alignment is within normal limits.  No acute cervical spine fracture identified, but there is multilevel multifactorial cervical spinal stenosis from C2-C3 to C6-C7, and maximal at C5-C6 (at least moderate). A component of ossification of the posterior longitudinal ligament contributes to the C5-C6 stenosis.  Grossly intact visualized upper thoracic levels. Negative lung apices. Calcified atherosclerosis.  IMPRESSION: 1. Mild left periorbital soft tissue injury. No underlying fracture. 2.  No acute intracranial abnormality.  3. No acute fracture or listhesis identified in the cervical spine. Ligamentous injury is not excluded. 4. Widespread degenerative cervical spinal stenosis, at least moderate at C5-C6 and with a component of OPLL.   Electronically Signed   By: Lars Pinks M.D.   On: 12/21/2013 11:32   Ct Cervical Spine Wo Contrast  12/21/2013   CLINICAL DATA:  80 year old female status post fall with laceration, headache and neck pain. History of stroke. Initial encounter.  EXAM: CT  HEAD WITHOUT CONTRAST  CT MAXILLOFACIAL WITHOUT CONTRAST  CT CERVICAL SPINE WITHOUT CONTRAST  TECHNIQUE: Multidetector CT imaging of the head, cervical spine, and maxillofacial structures were performed using the standard protocol without intravenous contrast. Multiplanar CT image reconstructions of the cervical spine and maxillofacial structures were also generated.  COMPARISON:  Head CT 11/30/2013 and earlier.  FINDINGS: CT HEAD FINDINGS  Visualized paranasal sinuses and mastoids are clear. Small left of midline forehead scalp lipoma unchanged. No scalp hematoma identified. Calvarium intact.  Calcified atherosclerosis at the skull base. Stable cerebral volume. Stable asymmetric extra-axial CSF along the left frontal convexity. No midline shift, mass effect, or evidence of intracranial mass lesion. No acute intracranial hemorrhage identified. No ventriculomegaly. Small chronic lacunar infarct in the left cerebellum is stable. Mild to moderate for age patchy white matter hypodensity is stable. No evidence of cortically based acute infarction identified.  CT MAXILLOFACIAL FINDINGS  Postoperative changes to the globes. Mild left periorbital superficial soft tissue thickening and swelling compatible with superficial soft tissue injury. Bilateral orbital walls remain intact. Paranasal sinuses are clear except for minor left maxillary alveolar recess mucosal thickening. Mandible appears intact. No acute facial fracture identified.  Visualized deep soft tissue spaces of the face are within normal limits.  CT CERVICAL SPINE FINDINGS  Reversal of cervical lordosis with diffusely advanced disc space loss and endplate degeneration. Visualized skull base is intact. No atlanto-occipital dissociation. Cervicothoracic junction alignment is within normal limits. Bilateral posterior element alignment is within normal limits.  No acute cervical spine fracture identified, but there is multilevel multifactorial cervical spinal  stenosis from C2-C3 to C6-C7, and maximal at C5-C6 (at least moderate). A component of ossification of the posterior longitudinal ligament contributes to the C5-C6 stenosis.  Grossly intact visualized upper thoracic levels. Negative lung apices. Calcified atherosclerosis.  IMPRESSION: 1. Mild left periorbital soft tissue injury. No underlying fracture. 2.  No acute intracranial abnormality. 3. No acute fracture or listhesis identified in the cervical spine. Ligamentous injury is not excluded. 4. Widespread degenerative cervical spinal stenosis, at least moderate at C5-C6 and with a component of OPLL.   Electronically Signed   By: Lars Pinks M.D.   On: 12/21/2013 11:32   Dg Knee Complete 4 Views Left  12/21/2013   CLINICAL DATA:  80 year old female status post fall with lacerations and pain. Initial encounter.  EXAM: LEFT KNEE - COMPLETE 4+ VIEW  COMPARISON:  None.  FINDINGS: Extensive calcified atherosclerosis in the visible left lower extremity. Mild medial compartment joint space loss. Mild tricompartmental degenerative spurring. No joint effusion identified. Bone mineralization is within normal limits for age. No acute fracture or dislocation. There does appear to be soft tissue swelling overlying the patella.  IMPRESSION: No acute fracture or dislocation identified about the left knee. Anterior knee soft tissue swelling.   Electronically Signed   By: Lars Pinks M.D.   On: 12/21/2013 11:41   Dg Finger Little Left  12/21/2013   CLINICAL DATA:  80 year old female status post fall with pain  and bruising. Initial encounter.  EXAM: LEFT LITTLE FINGER 2+V  COMPARISON:  None.  FINDINGS: Three views of the left fifth finger. Bone mineralization is within normal limits for age. Mild distal joint space loss and moderate osteophytosis. No acute fracture or dislocation identified.  IMPRESSION: No acute fracture or dislocation identified about the left fifth finger.   Electronically Signed   By: Lars Pinks M.D.   On:  12/21/2013 11:42   Ct Maxillofacial Wo Cm  12/21/2013   CLINICAL DATA:  80 year old female status post fall with laceration, headache and neck pain. History of stroke. Initial encounter.  EXAM: CT HEAD WITHOUT CONTRAST  CT MAXILLOFACIAL WITHOUT CONTRAST  CT CERVICAL SPINE WITHOUT CONTRAST  TECHNIQUE: Multidetector CT imaging of the head, cervical spine, and maxillofacial structures were performed using the standard protocol without intravenous contrast. Multiplanar CT image reconstructions of the cervical spine and maxillofacial structures were also generated.  COMPARISON:  Head CT 11/30/2013 and earlier.  FINDINGS: CT HEAD FINDINGS  Visualized paranasal sinuses and mastoids are clear. Small left of midline forehead scalp lipoma unchanged. No scalp hematoma identified. Calvarium intact.  Calcified atherosclerosis at the skull base. Stable cerebral volume. Stable asymmetric extra-axial CSF along the left frontal convexity. No midline shift, mass effect, or evidence of intracranial mass lesion. No acute intracranial hemorrhage identified. No ventriculomegaly. Small chronic lacunar infarct in the left cerebellum is stable. Mild to moderate for age patchy white matter hypodensity is stable. No evidence of cortically based acute infarction identified.  CT MAXILLOFACIAL FINDINGS  Postoperative changes to the globes. Mild left periorbital superficial soft tissue thickening and swelling compatible with superficial soft tissue injury. Bilateral orbital walls remain intact. Paranasal sinuses are clear except for minor left maxillary alveolar recess mucosal thickening. Mandible appears intact. No acute facial fracture identified.  Visualized deep soft tissue spaces of the face are within normal limits.  CT CERVICAL SPINE FINDINGS  Reversal of cervical lordosis with diffusely advanced disc space loss and endplate degeneration. Visualized skull base is intact. No atlanto-occipital dissociation. Cervicothoracic junction  alignment is within normal limits. Bilateral posterior element alignment is within normal limits.  No acute cervical spine fracture identified, but there is multilevel multifactorial cervical spinal stenosis from C2-C3 to C6-C7, and maximal at C5-C6 (at least moderate). A component of ossification of the posterior longitudinal ligament contributes to the C5-C6 stenosis.  Grossly intact visualized upper thoracic levels. Negative lung apices. Calcified atherosclerosis.  IMPRESSION: 1. Mild left periorbital soft tissue injury. No underlying fracture. 2.  No acute intracranial abnormality. 3. No acute fracture or listhesis identified in the cervical spine. Ligamentous injury is not excluded. 4. Widespread degenerative cervical spinal stenosis, at least moderate at C5-C6 and with a component of OPLL.   Electronically Signed   By: Lars Pinks M.D.   On: 12/21/2013 11:32     EKG Interpretation None      MDM   Final diagnoses:  Fall, initial encounter  Facial contusion, initial encounter  Knee contusion, left, initial encounter  Hand contusion, left, initial encounter    Patient with fall on anticoagulation. X-rays and CTs reassuring. Doubt cervical injury. Will discharge home. Patient was given good followup instructions she is on anticoagulation.    Jasper Riling. Alvino Chapel, MD 12/21/13 1616

## 2013-12-21 NOTE — ED Notes (Signed)
Caroline Myers, Ortho tech called for splint application

## 2013-12-21 NOTE — ED Notes (Signed)
MD at bedside. 

## 2014-01-04 ENCOUNTER — Encounter: Payer: Self-pay | Admitting: Neurology

## 2014-01-04 ENCOUNTER — Ambulatory Visit (INDEPENDENT_AMBULATORY_CARE_PROVIDER_SITE_OTHER): Payer: Medicare Other | Admitting: Neurology

## 2014-01-04 DIAGNOSIS — G459 Transient cerebral ischemic attack, unspecified: Secondary | ICD-10-CM | POA: Diagnosis not present

## 2014-01-04 DIAGNOSIS — I6350 Cerebral infarction due to unspecified occlusion or stenosis of unspecified cerebral artery: Secondary | ICD-10-CM

## 2014-01-04 DIAGNOSIS — I639 Cerebral infarction, unspecified: Secondary | ICD-10-CM

## 2014-01-04 NOTE — Progress Notes (Signed)
PATIENT: Caroline Myers DOB: 03-16-1924  REASON FOR VISIT: hospital stroke follow up HISTORY FROM: patient  HISTORY OF PRESENT ILLNESS: Caroline Myers is an 80 y.o. female history of atrial fibrillation not on anticoagulation who experienced an episode of difficulty with reading comprehension for about one hour starting at 2 PM on 07/26/2013. At around 6 PM she was noted by husband to be confused and was using cooking and eating utensils inappropriately. The time she arrived in the emergency room deficits have resolved. She has no previous history of stroke nor TIA. She takes aspirin 81 mg per day. She reportedly has refused anticoagulation with Coumadin. CT scan of her head showed no acute intracranial abnormality. NIH stroke score was 0. Patient was not administerd TPA secondary to rapidly resolving deficits . She was admitted for further evaluation and treatment. MRI of the brain showed small acute right cerebellar infarct. No mass effect or hemorrhage.  Several small acute to subacute lacunar infarcts in the left frontal lobe white matter, favor synchronous small vessel disease rather than embolic disease.  Underlying chronic small vessel disease, including a small chronic left cerebellar lacunar infarct.  MRA of the brain showed patent distal vertebral and basilar arteries, the left vertebral terminates in PICA. Moderate to severe bilateral PCA stenosis with preserved distal flow. Mildly dolichoectatic anterior circulation. 2D Echocardiogram showed an EF 55-60% with no source of embolus.  Upon discharge she was started on Eliquis daily, and she has tolerated this well.  They are unhappy about the cost.  She had no lasting deficits and has no repeat symptoms. Update 01/04/2014 : She returns for followup after last visit 3 months ago. She continues to do well without recurrent stroke or TIA symptoms. She is tolerating eliquis well without significant bleeding, bruising or other side effects. She  convinced to have mild gait and balance difficulties but these are unchanged. She's not had any major falls. She has had no new health problems. REVIEW OF SYSTEMS: Full 14 system review of systems performed and notable only for: Shortness of breath, cough, neck stiffness, gait difficulty only and all other systems negative  ALLERGIES: Allergies  Allergen Reactions  . Tape Other (See Comments)    Rips skin    HOME MEDICATIONS: Outpatient Prescriptions Prior to Visit  Medication Sig Dispense Refill  . apixaban (ELIQUIS) 5 MG TABS tablet Take 5 mg by mouth 2 (two) times daily.      . Multiple Vitamin (MULTIVITAMIN WITH MINERALS) TABS tablet Take 1 tablet by mouth every morning.       . sertraline (ZOLOFT) 25 MG tablet Take 37.5 mg by mouth every morning.       No facility-administered medications prior to visit.     PHYSICAL EXAM  There were no vitals filed for this visit. There is no weight on file to calculate BMI.  Generalized: Frail elderly Caucasian lady, in no acute distress  Head: normocephalic and atraumatic. Oropharynx benign  Neck: Supple, no carotid bruits  Cardiac: Irrgular rate and rhythm, systolic murmur 2/6 Musculoskeletal: No deformity  There were no vitals filed for this visit.  Neurological examination  Mentation: Alert oriented to time, place, history taking. Follows all commands speech and language fluent Cranial nerve II-XII: Fundoscopic exam not done. Pupils were equal round reactive to light extraocular movements were full, visual field were full on confrontational test. Facial sensation and strength were normal. hearing was intact to finger rubbing bilaterally. Uvula tongue midline. head turning and shoulder shrug  and were normal and symmetric.Tongue protrusion into cheek strength was normal. Motor: The motor testing reveals 5 over 5 strength of all 4 extremities. Good symmetric motor tone is noted throughout.  Sensory: Sensory testing is intact to pinprick,  soft touch on all 4 extremities. No evidence of extinction is noted.  Coordination: Cerebellar testing reveals good finger-nose-finger and heel-to-shin bilaterally.  Gait and station: Gait is normal. Tandem gait is performed with mild difficulty . Romberg is negative.    Reflexes: Deep tendon reflexes are symmetric and normal bilaterally. Toes are downgoing bilaterally.   DIAGNOSTIC DATA (LABS, IMAGING, TESTING) - I reviewed patient records, labs, notes, testing and imaging myself where available.  Lab Results  Component Value Date   HGBA1C 5.6 07/27/2013   ASSESSMENT AND PLAN 80 y.o. 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 She has done well with only mild gait and balance residual deficits.  PLAN: I had a long discussion the patient and her husband regarding second of stroke prevention strategies and answered questions .Continue eliquis for secondary stroke prevention and maintain  adequate hydration. Check followup carotid ultrasound study. Return for followup in 6 months with [name], NP or call earlier if necessary.    Antony Contras, M.D.  01/04/2014, 3:24 PM [facility] [address], [place], [place] [address] [phone]  Note: This document was prepared with digital dictation and possible smart phrase technology. Any transcriptional errors that result from this process are unintentional.

## 2014-01-04 NOTE — Patient Instructions (Signed)
I had a long discussion the patient and her husband regarding second of stroke prevention strategies and answered questions .Continue eliquis for secondary stroke prevention and maintain  adequate hydration. Check followup carotid ultrasound study. Return for followup in 6 months with [name], NP or call earlier if necessary.  Stroke Prevention Some medical conditions and behaviors are associated with an increased chance of having a stroke. You may prevent a stroke by making healthy choices and managing medical conditions. HOW CAN I REDUCE MY RISK OF HAVING A STROKE?   Stay physically active. Get at least 30 minutes of activity on most or all days.  Do not smoke. It may also be helpful to avoid exposure to secondhand smoke.  Limit alcohol use. Moderate alcohol use is considered to be:  No more than 2 drinks per day for men.  No more than 1 drink per day for nonpregnant women.  Eat healthy foods. This involves  Eating 5 or more servings of fruits and vegetables a day.  Following a diet that addresses high blood pressure (hypertension), high cholesterol, diabetes, or obesity.  Manage your cholesterol levels.  A diet low in saturated fat, trans fat, and cholesterol and high in fiber may control cholesterol levels.  Take any prescribed medicines to control cholesterol as directed by your health care provider.  Manage your diabetes.  A controlled-carbohydrate, controlled-sugar diet is recommended to manage diabetes.  Take any prescribed medicines to control diabetes as directed by your health care provider.  Control your hypertension.  A low-salt (sodium), low-saturated fat, low-trans fat, and low-cholesterol diet is recommended to manage hypertension.  Take any prescribed medicines to control hypertension as directed by your health care provider.  Maintain a healthy weight.  A reduced-calorie, low-sodium, low-saturated fat, low-trans fat, low-cholesterol diet is recommended to  manage weight.  Stop drug abuse.  Avoid taking birth control pills.  Talk to your health care provider about the risks of taking birth control pills if you are over 22 years old, smoke, get migraines, or have ever had a blood clot.  Get evaluated for sleep disorders (sleep apnea).  Talk to your health care provider about getting a sleep evaluation if you snore a lot or have excessive sleepiness.  Take medicines as directed by your health care provider.  For some people, aspirin or blood thinners (anticoagulants) are helpful in reducing the risk of forming abnormal blood clots that can lead to stroke. If you have the irregular heart rhythm of atrial fibrillation, you should be on a blood thinner unless there is a good reason you cannot take them.  Understand all your medicine instructions.  Make sure that other other conditions (such as anemia or atherosclerosis) are addressed. SEEK IMMEDIATE MEDICAL CARE IF:   You have sudden weakness or numbness of the face, arm, or leg, especially on one side of the body.  Your face or eyelid droops to one side.  You have sudden confusion.  You have trouble speaking (aphasia) or understanding.  You have sudden trouble seeing in one or both eyes.  You have sudden trouble walking.  You have dizziness.  You have a loss of balance or coordination.  You have a sudden, severe headache with no known cause.  You have new chest pain or an irregular heartbeat. Any of these symptoms may represent a serious problem that is an emergency. Do not wait to see if the symptoms will go away. Get medical help at once. Call your local emergency services  (911  in [place] Do not drive yourself to the hospital. Document Released: 07/25/2004 Document Revised: 04/07/2013 Document Reviewed: 12/18/2012 [facility] Patient Information 2015 [place], [place]. This information is not intended to replace advice given to you by your health care provider. Make sure you discuss  any questions you have with your health care provider.

## 2014-01-11 ENCOUNTER — Ambulatory Visit (INDEPENDENT_AMBULATORY_CARE_PROVIDER_SITE_OTHER): Payer: Medicare Other

## 2014-01-11 DIAGNOSIS — I639 Cerebral infarction, unspecified: Secondary | ICD-10-CM

## 2014-01-11 DIAGNOSIS — I6350 Cerebral infarction due to unspecified occlusion or stenosis of unspecified cerebral artery: Secondary | ICD-10-CM

## 2014-01-19 ENCOUNTER — Telehealth: Payer: Self-pay | Admitting: Interventional Cardiology

## 2014-01-19 NOTE — Telephone Encounter (Signed)
Walk in pt Form " [place]" papers Dropped off gave to [name] 7.22.15/km

## 2014-01-24 ENCOUNTER — Other Ambulatory Visit (INDEPENDENT_AMBULATORY_CARE_PROVIDER_SITE_OTHER): Payer: Medicare Other

## 2014-01-24 DIAGNOSIS — I4891 Unspecified atrial fibrillation: Secondary | ICD-10-CM | POA: Diagnosis not present

## 2014-01-24 LAB — CBC
HCT: 44.1 % (ref 36.0–46.0)
HEMOGLOBIN: 14.8 g/dL (ref 12.0–15.0)
MCHC: 33.6 g/dL (ref 30.0–36.0)
MCV: 98.6 fl (ref 78.0–100.0)
PLATELETS: 236 10*3/uL (ref 150.0–400.0)
RBC: 4.48 Mil/uL (ref 3.87–5.11)
RDW: 13.6 % (ref 11.5–15.5)
WBC: 7.5 10*3/uL (ref 4.0–10.5)

## 2014-01-24 LAB — BASIC METABOLIC PANEL
BUN: 20 mg/dL (ref 6–23)
CALCIUM: 8.8 mg/dL (ref 8.4–10.5)
CO2: 26 meq/L (ref 19–32)
Chloride: 105 mEq/L (ref 96–112)
Creatinine, Ser: 0.9 mg/dL (ref 0.4–1.2)
GFR: 67.15 mL/min (ref >60.00)
GLUCOSE: 97 mg/dL (ref 70–99)
POTASSIUM: 4.1 meq/L (ref 3.5–5.1)
SODIUM: 139 meq/L (ref 135–145)

## 2014-01-26 DIAGNOSIS — I4891 Unspecified atrial fibrillation: Secondary | ICD-10-CM | POA: Diagnosis not present

## 2014-01-26 DIAGNOSIS — Z79899 Other long term (current) drug therapy: Secondary | ICD-10-CM | POA: Diagnosis not present

## 2014-01-26 DIAGNOSIS — E7800 Pure hypercholesterolemia, unspecified: Secondary | ICD-10-CM | POA: Diagnosis not present

## 2014-01-28 DIAGNOSIS — R0609 Other forms of dyspnea: Secondary | ICD-10-CM | POA: Diagnosis not present

## 2014-01-28 DIAGNOSIS — M25569 Pain in unspecified knee: Secondary | ICD-10-CM | POA: Diagnosis not present

## 2014-01-28 DIAGNOSIS — Z23 Encounter for immunization: Secondary | ICD-10-CM | POA: Diagnosis not present

## 2014-01-28 DIAGNOSIS — R0989 Other specified symptoms and signs involving the circulatory and respiratory systems: Secondary | ICD-10-CM | POA: Diagnosis not present

## 2014-01-28 DIAGNOSIS — R269 Unspecified abnormalities of gait and mobility: Secondary | ICD-10-CM | POA: Diagnosis not present

## 2014-01-28 DIAGNOSIS — Z0000 Encounter for general adult medical examination without abnormal findings: Secondary | ICD-10-CM | POA: Diagnosis not present

## 2014-01-28 DIAGNOSIS — I4891 Unspecified atrial fibrillation: Secondary | ICD-10-CM | POA: Diagnosis not present

## 2014-01-28 DIAGNOSIS — F411 Generalized anxiety disorder: Secondary | ICD-10-CM | POA: Diagnosis not present

## 2014-01-31 ENCOUNTER — Telehealth: Payer: Self-pay | Admitting: *Deleted

## 2014-01-31 DIAGNOSIS — H04129 Dry eye syndrome of unspecified lacrimal gland: Secondary | ICD-10-CM | POA: Diagnosis not present

## 2014-01-31 DIAGNOSIS — H5310 Unspecified subjective visual disturbances: Secondary | ICD-10-CM | POA: Diagnosis not present

## 2014-01-31 DIAGNOSIS — S8000XA Contusion of unspecified knee, initial encounter: Secondary | ICD-10-CM | POA: Diagnosis not present

## 2014-01-31 NOTE — Telephone Encounter (Signed)
I called and relayed the normal carotid doppler study results.  Repeat in 12 months. Pt verbalized understanding.  Pt confirmed next appt with LLam, NP in July 07, 2014 at 1430.

## 2014-02-02 ENCOUNTER — Telehealth: Payer: Self-pay | Admitting: Interventional Cardiology

## 2014-02-02 NOTE — Telephone Encounter (Signed)
Dr.Varanasi Completed [place] form faxed to [phone] Mailed Original To Pt home Address per her request.  8.5.15/km

## 2014-02-03 ENCOUNTER — Ambulatory Visit: Payer: Medicare Other | Attending: Family Medicine | Admitting: Physical Therapy

## 2014-02-03 DIAGNOSIS — M6281 Muscle weakness (generalized): Secondary | ICD-10-CM | POA: Insufficient documentation

## 2014-02-03 DIAGNOSIS — R269 Unspecified abnormalities of gait and mobility: Secondary | ICD-10-CM | POA: Insufficient documentation

## 2014-02-03 DIAGNOSIS — IMO0001 Reserved for inherently not codable concepts without codable children: Secondary | ICD-10-CM | POA: Diagnosis not present

## 2014-02-08 ENCOUNTER — Ambulatory Visit: Payer: Medicare Other | Admitting: Physical Therapy

## 2014-02-08 ENCOUNTER — Telehealth: Payer: Self-pay | Admitting: Interventional Cardiology

## 2014-02-08 ENCOUNTER — Other Ambulatory Visit: Payer: Self-pay | Admitting: Cardiology

## 2014-02-08 MED ORDER — APIXABAN 5 MG PO TABS: 5.0000 mg | ORAL_TABLET | Freq: Two times a day (BID) | ORAL | Status: DC

## 2014-02-08 NOTE — Telephone Encounter (Signed)
Walk In pt form " Sealed Envelope" gave to [name] 8.11.15/km

## 2014-02-10 ENCOUNTER — Ambulatory Visit: Payer: Medicare Other | Admitting: Physical Therapy

## 2014-02-10 DIAGNOSIS — R269 Unspecified abnormalities of gait and mobility: Secondary | ICD-10-CM | POA: Diagnosis not present

## 2014-02-10 DIAGNOSIS — M6281 Muscle weakness (generalized): Secondary | ICD-10-CM | POA: Diagnosis not present

## 2014-02-10 DIAGNOSIS — IMO0001 Reserved for inherently not codable concepts without codable children: Secondary | ICD-10-CM | POA: Diagnosis not present

## 2014-02-16 ENCOUNTER — Encounter: Payer: Medicare Other | Admitting: Physical Therapy

## 2014-02-18 ENCOUNTER — Encounter: Payer: Medicare Other | Admitting: Physical Therapy

## 2014-02-23 ENCOUNTER — Encounter: Payer: Medicare Other | Admitting: Physical Therapy

## 2014-02-23 ENCOUNTER — Telehealth: Payer: Self-pay | Admitting: Interventional Cardiology

## 2014-02-23 NOTE — Telephone Encounter (Signed)
New message    Patient calling need to speak with nurse did not disclose any information.

## 2014-02-23 NOTE — Telephone Encounter (Signed)
Pt called stating that she received a letter from [facility] stating they needed written rx and proof of income. I told pt this was faxed earlier this month and I would call to see what the status is.

## 2014-02-23 NOTE — Telephone Encounter (Addendum)
Called [employer] and they stated they didn't receive fax. Re-faxed proof of income and written rx for eliquis.

## 2014-02-25 ENCOUNTER — Encounter: Payer: Medicare Other | Admitting: Physical Therapy

## 2014-02-28 NOTE — Telephone Encounter (Signed)
Spoke with [employer] and they have finally received everything they need to process pts application. I called pt to let her know application is being processed and it can take 7+ business days.

## 2014-03-02 ENCOUNTER — Encounter: Payer: Medicare Other | Admitting: Physical Therapy

## 2014-03-02 DIAGNOSIS — Z23 Encounter for immunization: Secondary | ICD-10-CM | POA: Diagnosis not present

## 2014-03-02 DIAGNOSIS — F430 Acute stress reaction: Secondary | ICD-10-CM | POA: Diagnosis not present

## 2014-03-02 DIAGNOSIS — F411 Generalized anxiety disorder: Secondary | ICD-10-CM | POA: Diagnosis not present

## 2014-03-03 ENCOUNTER — Ambulatory Visit: Payer: Medicare Other | Admitting: Physical Therapy

## 2014-03-04 ENCOUNTER — Encounter: Payer: Medicare Other | Admitting: Physical Therapy

## 2014-03-15 DIAGNOSIS — L821 Other seborrheic keratosis: Secondary | ICD-10-CM | POA: Diagnosis not present

## 2014-03-15 DIAGNOSIS — Z85828 Personal history of other malignant neoplasm of skin: Secondary | ICD-10-CM | POA: Diagnosis not present

## 2014-03-15 DIAGNOSIS — D485 Neoplasm of uncertain behavior of skin: Secondary | ICD-10-CM | POA: Diagnosis not present

## 2014-03-15 DIAGNOSIS — D239 Other benign neoplasm of skin, unspecified: Secondary | ICD-10-CM | POA: Diagnosis not present

## 2014-03-15 DIAGNOSIS — D219 Benign neoplasm of connective and other soft tissue, unspecified: Secondary | ICD-10-CM | POA: Diagnosis not present

## 2014-03-15 DIAGNOSIS — C44319 Basal cell carcinoma of skin of other parts of face: Secondary | ICD-10-CM | POA: Diagnosis not present

## 2014-03-21 ENCOUNTER — Telehealth: Payer: Self-pay

## 2014-03-21 NOTE — Telephone Encounter (Signed)
LEFT A MESSAGE FOR PATIENT TO PICK UP ELIQUIS

## 2014-03-28 DIAGNOSIS — J45909 Unspecified asthma, uncomplicated: Secondary | ICD-10-CM | POA: Diagnosis not present

## 2014-03-29 ENCOUNTER — Ambulatory Visit: Payer: Self-pay | Admitting: Neurology

## 2014-03-31 DIAGNOSIS — B351 Tinea unguium: Secondary | ICD-10-CM | POA: Diagnosis not present

## 2014-03-31 DIAGNOSIS — M79675 Pain in left toe(s): Secondary | ICD-10-CM | POA: Diagnosis not present

## 2014-03-31 DIAGNOSIS — M79674 Pain in right toe(s): Secondary | ICD-10-CM | POA: Diagnosis not present

## 2014-05-02 DIAGNOSIS — M25552 Pain in left hip: Secondary | ICD-10-CM | POA: Diagnosis not present

## 2014-05-02 DIAGNOSIS — F430 Acute stress reaction: Secondary | ICD-10-CM | POA: Diagnosis not present

## 2014-05-05 ENCOUNTER — Encounter: Payer: Self-pay | Admitting: *Deleted

## 2014-05-06 ENCOUNTER — Encounter: Payer: Self-pay | Admitting: *Deleted

## 2014-05-27 DIAGNOSIS — R05 Cough: Secondary | ICD-10-CM | POA: Diagnosis not present

## 2014-06-06 DIAGNOSIS — H5319 Other subjective visual disturbances: Secondary | ICD-10-CM | POA: Diagnosis not present

## 2014-06-06 DIAGNOSIS — Z961 Presence of intraocular lens: Secondary | ICD-10-CM | POA: Diagnosis not present

## 2014-06-06 DIAGNOSIS — H02831 Dermatochalasis of right upper eyelid: Secondary | ICD-10-CM | POA: Diagnosis not present

## 2014-06-06 DIAGNOSIS — H04123 Dry eye syndrome of bilateral lacrimal glands: Secondary | ICD-10-CM | POA: Diagnosis not present

## 2014-06-27 ENCOUNTER — Telehealth: Payer: Self-pay | Admitting: *Deleted

## 2014-06-27 NOTE — Telephone Encounter (Signed)
Patient Assistance Program application and prescription for Eliquis faxed to [place].

## 2014-07-06 NOTE — Telephone Encounter (Signed)
Patient approved for Eliquis with Patient Assistance Program through 07/06/2015. Patient notified and advised she will be contacted as soon as shipment gets to the office for pick-up.

## 2014-07-07 ENCOUNTER — Ambulatory Visit: Payer: Medicare Other | Admitting: Neurology

## 2014-07-12 ENCOUNTER — Telehealth: Payer: Self-pay

## 2014-07-12 DIAGNOSIS — Z85828 Personal history of other malignant neoplasm of skin: Secondary | ICD-10-CM | POA: Diagnosis not present

## 2014-07-12 DIAGNOSIS — L723 Sebaceous cyst: Secondary | ICD-10-CM | POA: Diagnosis not present

## 2014-07-12 DIAGNOSIS — Z1389 Encounter for screening for other disorder: Secondary | ICD-10-CM | POA: Diagnosis not present

## 2014-07-12 NOTE — Telephone Encounter (Signed)
Called to let her know that her eliquis was here

## 2014-07-14 DIAGNOSIS — B351 Tinea unguium: Secondary | ICD-10-CM | POA: Diagnosis not present

## 2014-07-14 DIAGNOSIS — M79675 Pain in left toe(s): Secondary | ICD-10-CM | POA: Diagnosis not present

## 2014-07-14 DIAGNOSIS — M79674 Pain in right toe(s): Secondary | ICD-10-CM | POA: Diagnosis not present

## 2014-07-20 ENCOUNTER — Encounter: Payer: Self-pay | Admitting: Neurology

## 2014-07-20 ENCOUNTER — Ambulatory Visit (INDEPENDENT_AMBULATORY_CARE_PROVIDER_SITE_OTHER): Payer: Medicare Other | Admitting: Neurology

## 2014-07-20 VITALS — BP 103/64 | HR 78 | Ht 65.5 in | Wt 141.0 lb

## 2014-07-20 DIAGNOSIS — G44209 Tension-type headache, unspecified, not intractable: Secondary | ICD-10-CM | POA: Diagnosis not present

## 2014-07-20 NOTE — Progress Notes (Signed)
PATIENT: Caroline Myers DOB: [birth year]  REASON FOR VISIT: hospital stroke follow up HISTORY FROM: patient  HISTORY OF PRESENT ILLNESS: Caroline Myers is an 81 y.o. female history of atrial fibrillation not on anticoagulation who experienced an episode of difficulty with reading comprehension for about one hour starting at 2 PM on 07/26/2013. At around 6 PM she was noted by husband to be confused and was using cooking and eating utensils inappropriately. The time she arrived in the emergency room deficits have resolved. She has no previous history of stroke nor TIA. She takes aspirin 81 mg per day. She reportedly has refused anticoagulation with Coumadin. CT scan of her head showed no acute intracranial abnormality. NIH stroke score was 0. Patient was not administerd TPA secondary to rapidly resolving deficits . She was admitted for further evaluation and treatment. MRI of the brain showed small acute right cerebellar infarct. No mass effect or hemorrhage.  Several small acute to subacute lacunar infarcts in the left frontal lobe white matter, favor synchronous small vessel disease rather than embolic disease.  Underlying chronic small vessel disease, including a small chronic left cerebellar lacunar infarct.  MRA of the brain showed patent distal vertebral and basilar arteries, the left vertebral terminates in PICA. Moderate to severe bilateral PCA stenosis with preserved distal flow. Mildly dolichoectatic anterior circulation. 2D Echocardiogram showed an EF 55-60% with no source of embolus.  Upon discharge she was started on Eliquis daily, and she has tolerated this well.  They are unhappy about the cost.  She had no lasting deficits and has no repeat symptoms. Update 01/04/2014 : She returns for followup after last visit 3 months ago. She continues to do well without recurrent stroke or TIA symptoms. She is tolerating eliquis well without significant bleeding, bruising or other side effects. She  convinced to have mild gait and balance difficulties but these are unchanged. She's not had any major falls. She has had no new health problems. Update 07/20/2014 ; she returns for follow-up after last visit 6 months ago. She has not had any recurrent stroke or TIA symptoms. She however has been under significant stress due to her husband's sickness. She continues to have mild balance and gait difficulties and has had one fall since June last year. She also complains of occasional posterior headaches which are mild pressure-like in quality and 9 and 5/10 in severity. She does not need to take medications for these. She remains on eliquis which is tolerating well without significant bleeding and only minor bruising. She is also on Zoloft for depression but feels her depression is stable. She in the past used to meditate and 2 yolk but of late she has not managed to find time to do so. She does complain of mild short-term memory difficulties but these do not appear to be progressive or interfere with her activities of daily living.  REVIEW OF SYSTEMS: Full 14 system review of systems performed and notable only for: Runny nose, shortness of breath, easy bruising, memory loss, headache, weakness, anxiety, nervousness only and all other systems negative  ALLERGIES: Allergies  Allergen Reactions  . Tape Other (See Comments)    Rips skin    HOME MEDICATIONS: Outpatient Prescriptions Prior to Visit  Medication Sig Dispense Refill  . apixaban (ELIQUIS) 5 MG TABS tablet Take 1 tablet (5 mg total) by mouth 2 (two) times daily. 180 tablet 3  . sertraline (ZOLOFT) 25 MG tablet Take 37.5 mg by mouth every morning.    Marland Kitchen  Multiple Vitamin (MULTIVITAMIN WITH MINERALS) TABS tablet Take 1 tablet by mouth every morning.      No facility-administered medications prior to visit.     PHYSICAL EXAM  Filed Vitals:   07/20/14 1505  BP: 103/64  Pulse: 78  Height: 5' 5.5" (1.664 m)  Weight: 141 lb (63.957 kg)    Body mass index is 23.1 kg/(m^2).  Generalized: Frail elderly Caucasian lady, in no acute distress  Head: normocephalic and atraumatic. Oropharynx benign  Neck: Supple, no carotid bruits  Cardiac: Irrgular rate and rhythm, systolic murmur 2/6 Musculoskeletal: mild kyphoscoliotic deformity of back Filed Vitals:   07/20/14 1505  BP: 103/64  Pulse: 78    Neurological examination  Mentation: Alert oriented to time, place, history taking. Follows all commands speech and language fluent Cranial nerve II-XII: Fundoscopic exam not done. Pupils were equal round reactive to light extraocular movements were full, visual field were full on confrontational test. Facial sensation and strength were normal. hearing was intact to finger rubbing bilaterally. Uvula tongue midline. head turning and shoulder shrug and were normal and symmetric.Tongue protrusion into cheek strength was normal. Motor: The motor testing reveals 5 over 5 strength of all 4 extremities. Good symmetric motor tone is noted throughout.  Sensory: Sensory testing is intact to pinprick, soft touch on all 4 extremities. No evidence of extinction is noted.  Coordination: Cerebellar testing reveals good finger-nose-finger and heel-to-shin bilaterally.  Gait and station: Gait is normal. Tandem gait is performed with mild difficulty . Romberg is negative.    Reflexes: Deep tendon reflexes are symmetric and normal bilaterally. Toes are downgoing bilaterally.   DIAGNOSTIC DATA (LABS, IMAGING, TESTING) - I reviewed patient records, labs, notes, testing and imaging myself where available.  Lab Results  Component Value Date   HGBA1C 5.6 07/27/2013   ASSESSMENT AND PLAN 81 y.o. 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 She has done well with only mild gait and balance residual deficits.  PLAN: I had a long discussion with the patient personally reviewed her carotid  ultrasound and discuss findings with the patient, discussed the risk for recurrent strokes and aggressive risk factor reduction and answered questions. I advised her to do regular neck stretching exercises and to participate in activities for stress relaxation to help with the tension headaches. She was also counseled to get up slowly and avoid certain movements to prevent falls. Return for follow-up in 6 months with Jinny Blossom ,nurse practitioner or call earlier if necessary   Antony Contras, M.D.  07/20/2014, 4:30 PM [facility] [address], [place],  [address] [phone]  Note: This document was prepared with digital dictation and possible smart phrase technology. Any transcriptional errors that result from this process are unintentional.

## 2014-07-20 NOTE — Patient Instructions (Signed)
I had a long discussion with the patient personally reviewed her carotid ultrasound and discuss findings with the patient, discussed the risk for recurrent strokes and aggressive risk factor reduction and answered questions. I advised her to do regular neck stretching exercises and to participate in activities for stress relaxation to help with the tension headaches. She was also counseled to get up slowly and avoid certain movements to prevent falls. Return for follow-up in 6 months with Jinny Blossom ,nurse practitioner or call earlier if necessary

## 2014-07-25 ENCOUNTER — Ambulatory Visit: Payer: Medicare Other | Admitting: Interventional Cardiology

## 2014-08-05 ENCOUNTER — Ambulatory Visit (INDEPENDENT_AMBULATORY_CARE_PROVIDER_SITE_OTHER): Payer: Medicare Other | Admitting: Interventional Cardiology

## 2014-08-05 ENCOUNTER — Encounter: Payer: Self-pay | Admitting: Interventional Cardiology

## 2014-08-05 VITALS — BP 128/80 | HR 70 | Ht 65.5 in | Wt 144.0 lb

## 2014-08-05 DIAGNOSIS — I359 Nonrheumatic aortic valve disorder, unspecified: Secondary | ICD-10-CM | POA: Diagnosis not present

## 2014-08-05 DIAGNOSIS — I480 Paroxysmal atrial fibrillation: Secondary | ICD-10-CM | POA: Diagnosis not present

## 2014-08-05 DIAGNOSIS — I4892 Unspecified atrial flutter: Secondary | ICD-10-CM

## 2014-08-05 DIAGNOSIS — I700 Atherosclerosis of aorta: Secondary | ICD-10-CM | POA: Insufficient documentation

## 2014-08-05 NOTE — Patient Instructions (Signed)
Your physician recommends that you continue on your current medications as directed. Please refer to the Current Medication list given to you today.  Your physician wants you to follow-up in: 9 months with Dr. Varanasi. You will receive a reminder letter in the mail two months in advance. If you don't receive a letter, please call our office to schedule the follow-up appointment.  

## 2014-08-05 NOTE — Progress Notes (Signed)
Patient ID: Caroline Myers, female   DOB: 03/25/1924, 81 y.o.   MRN: [id number] Patient ID: Caroline Myers, female   DOB: March 05, 1924, 81 y.o.   MRN: [id number]    [facility], [place], [place]  [address] Phone: [phone] Fax:  [phone]  Date:  08/05/2014   ID:  Caroline Myers, DOB Aug 31, 1923, MRN [id number]  PCP:   Melinda Crutch, MD      History of Present Illness: Caroline Myers is a 81 y.o. female who has AFib. She did not want to take coumadin. SHe took a baby aspirin. Indigestion resolved.  She had back surgery and then a TIA in Jan 15.  She was discharged from the hospital on Eliquis.  It is very expensive. Atrial Fibrillation F/U:  cough. no bleeding problems. Denies : Chest pain.  Dizziness.  Leg edema.  Orthopnea.  Palpitations.  Syncope.   She reports some DOE, usually ok when she walks.  Echo in Jan 2015 showed normal LV with normal valve function.  Mild carotid disease.  Mild AS.    She is reducing her walking.  She has some balance issues.  She has fallen a few times.  She no longer goes out and walks  Alone.  She was walking in the house and fell once .  She has fallen on the street in 6/15 and tripped on a bump while carrying something.  She hurt the left side of her face.  She went to the ER and they kept her to make sure there was no bleeding issue.    No falls since then.      Wt Readings from Last 3 Encounters:  08/05/14 144 lb (65.318 kg)  07/20/14 141 lb (63.957 kg)  10/28/13 141 lb (63.957 kg)     Past Medical History  Diagnosis Date  . Allergy     allergic rhinitis  . Cancer     history of skin cancer of the nose  . Asthma 2000    mild. tried Advair after bronchospasm after exposure to cats  . Shoulder pain, left 05/2010    with rotator cuff tear--Dr Alfonso Ramus  . Paroxysmal atrial fibrillation     pt does not want anticoagulation  . Aortic stenosis     EF normal.  Mild aortic stenosis. Mild to moderate aortic regurgitation    . Anxiety   . Shortness of breath     occ  . GERD (gastroesophageal reflux disease)     occ  . Arthritis     Current Outpatient Prescriptions  Medication Sig Dispense Refill  . apixaban (ELIQUIS) 5 MG TABS tablet Take 1 tablet (5 mg total) by mouth 2 (two) times daily. 180 tablet 3  . Multiple Vitamins-Minerals (MULTIVITAMIN WITH MINERALS) tablet Take 1 tablet by mouth daily. ONE A DAY FOR WOMEN    . Sertraline HCl (ZOLOFT PO) Take 40 mg by mouth daily.     No current facility-administered medications for this visit.    Allergies:    Allergies  Allergen Reactions  . Tape Other (See Comments)    Rips skin    Social History:  The patient  reports that she has never smoked. She has never used smokeless tobacco. She reports that she drinks alcohol. She reports that she does not use illicit drugs.   Family History:  The patient's family history includes Stroke in her sister. There is no history of Heart attack.   ROS:  Please see the  history of present illness.  No nausea, vomiting.  No fevers, chills.  No focal weakness.  No dysuria. Occasional DOE   All other systems reviewed and negative.   PHYSICAL EXAM: VS:  BP 128/80 mmHg  Pulse 70  Ht 5' 5.5" (1.664 m)  Wt 144 lb (65.318 kg)  BMI 23.59 kg/m2 Well nourished, well developed, in no acute distress HEENT: normal Neck: no JVD, no carotid bruits Cardiac:  normal S1, S2; irregular;  Lungs:  clear to auscultation bilaterally, no wheezing, rhonchi or rales Abd: soft, nontender, no hepatomegaly Ext: no edema, tr DP pulses bilaterally Skin: warm and dry Neuro:   no focal abnormalities  Psych: normal affect  EKG:  Atrial flutter with variable block. Rate controlled   ASSESSMENT AND PLAN:  AFib...paroxysmal with TIA/stroke. Will use Eliquis for stroke prevention.  Given that she is in AFlutter at this time, would recommend staying on Eliquis- despite her falls.  She is being more careful.  Did not tolerate verapamil, or  metoprolol. Some itching with her current meds. Stressed the importance of avoiding falling. Her activity has been limited due to her husbands illness.  I instructed her to no longer carry anything while she walks.  Using a cane would be reasonable too.    Sinus bradycardia- when in NSR in Jan 2015.  HR 46.  Avoid rate slowing drugs  Abnormal ECHO: LVH, mild AS.  Watch for sx of angina or syncope.   2012 u/s showed no AAA.  Mild aortic atherosclerosis at that time.   Signed, Mina Marble, MD, [facility] 08/05/2014 4:20 PM

## 2014-09-05 DIAGNOSIS — Z961 Presence of intraocular lens: Secondary | ICD-10-CM | POA: Diagnosis not present

## 2014-09-05 DIAGNOSIS — H04123 Dry eye syndrome of bilateral lacrimal glands: Secondary | ICD-10-CM | POA: Diagnosis not present

## 2014-09-07 DIAGNOSIS — R531 Weakness: Secondary | ICD-10-CM | POA: Diagnosis not present

## 2014-09-08 DIAGNOSIS — M47812 Spondylosis without myelopathy or radiculopathy, cervical region: Secondary | ICD-10-CM | POA: Diagnosis not present

## 2014-09-08 DIAGNOSIS — M19011 Primary osteoarthritis, right shoulder: Secondary | ICD-10-CM | POA: Diagnosis not present

## 2014-09-08 DIAGNOSIS — M5030 Other cervical disc degeneration, unspecified cervical region: Secondary | ICD-10-CM | POA: Diagnosis not present

## 2014-09-12 DIAGNOSIS — M6281 Muscle weakness (generalized): Secondary | ICD-10-CM | POA: Diagnosis not present

## 2014-09-12 DIAGNOSIS — M25611 Stiffness of right shoulder, not elsewhere classified: Secondary | ICD-10-CM | POA: Diagnosis not present

## 2014-09-12 DIAGNOSIS — M19011 Primary osteoarthritis, right shoulder: Secondary | ICD-10-CM | POA: Diagnosis not present

## 2014-09-19 DIAGNOSIS — M19011 Primary osteoarthritis, right shoulder: Secondary | ICD-10-CM | POA: Diagnosis not present

## 2014-09-19 DIAGNOSIS — M6281 Muscle weakness (generalized): Secondary | ICD-10-CM | POA: Diagnosis not present

## 2014-09-19 DIAGNOSIS — M25611 Stiffness of right shoulder, not elsewhere classified: Secondary | ICD-10-CM | POA: Diagnosis not present

## 2014-09-22 DIAGNOSIS — M6281 Muscle weakness (generalized): Secondary | ICD-10-CM | POA: Diagnosis not present

## 2014-09-22 DIAGNOSIS — M19011 Primary osteoarthritis, right shoulder: Secondary | ICD-10-CM | POA: Diagnosis not present

## 2014-09-22 DIAGNOSIS — M25611 Stiffness of right shoulder, not elsewhere classified: Secondary | ICD-10-CM | POA: Diagnosis not present

## 2014-09-26 DIAGNOSIS — M6281 Muscle weakness (generalized): Secondary | ICD-10-CM | POA: Diagnosis not present

## 2014-09-26 DIAGNOSIS — M19011 Primary osteoarthritis, right shoulder: Secondary | ICD-10-CM | POA: Diagnosis not present

## 2014-09-26 DIAGNOSIS — M25611 Stiffness of right shoulder, not elsewhere classified: Secondary | ICD-10-CM | POA: Diagnosis not present

## 2014-09-28 DIAGNOSIS — M25611 Stiffness of right shoulder, not elsewhere classified: Secondary | ICD-10-CM | POA: Diagnosis not present

## 2014-09-28 DIAGNOSIS — M19011 Primary osteoarthritis, right shoulder: Secondary | ICD-10-CM | POA: Diagnosis not present

## 2014-09-28 DIAGNOSIS — M6281 Muscle weakness (generalized): Secondary | ICD-10-CM | POA: Diagnosis not present

## 2014-10-04 DIAGNOSIS — M79675 Pain in left toe(s): Secondary | ICD-10-CM | POA: Diagnosis not present

## 2014-10-04 DIAGNOSIS — M79674 Pain in right toe(s): Secondary | ICD-10-CM | POA: Diagnosis not present

## 2014-10-04 DIAGNOSIS — B351 Tinea unguium: Secondary | ICD-10-CM | POA: Diagnosis not present

## 2014-10-06 DIAGNOSIS — M25611 Stiffness of right shoulder, not elsewhere classified: Secondary | ICD-10-CM | POA: Diagnosis not present

## 2014-10-06 DIAGNOSIS — M6281 Muscle weakness (generalized): Secondary | ICD-10-CM | POA: Diagnosis not present

## 2014-10-06 DIAGNOSIS — M19011 Primary osteoarthritis, right shoulder: Secondary | ICD-10-CM | POA: Diagnosis not present

## 2014-10-10 DIAGNOSIS — M19011 Primary osteoarthritis, right shoulder: Secondary | ICD-10-CM | POA: Diagnosis not present

## 2014-10-10 DIAGNOSIS — M6281 Muscle weakness (generalized): Secondary | ICD-10-CM | POA: Diagnosis not present

## 2014-10-10 DIAGNOSIS — M25611 Stiffness of right shoulder, not elsewhere classified: Secondary | ICD-10-CM | POA: Diagnosis not present

## 2014-10-11 ENCOUNTER — Telehealth: Payer: Self-pay

## 2014-10-11 NOTE — Telephone Encounter (Signed)
Spoke to husband to let him know that her eliquis was here at the office

## 2014-10-17 DIAGNOSIS — H5310 Unspecified subjective visual disturbances: Secondary | ICD-10-CM | POA: Diagnosis not present

## 2014-10-17 DIAGNOSIS — H53123 Transient visual loss, bilateral: Secondary | ICD-10-CM | POA: Diagnosis not present

## 2014-10-18 DIAGNOSIS — M6281 Muscle weakness (generalized): Secondary | ICD-10-CM | POA: Diagnosis not present

## 2014-10-18 DIAGNOSIS — M25611 Stiffness of right shoulder, not elsewhere classified: Secondary | ICD-10-CM | POA: Diagnosis not present

## 2014-10-18 DIAGNOSIS — M19011 Primary osteoarthritis, right shoulder: Secondary | ICD-10-CM | POA: Diagnosis not present

## 2014-11-28 DIAGNOSIS — R05 Cough: Secondary | ICD-10-CM | POA: Diagnosis not present

## 2014-12-19 DIAGNOSIS — M25611 Stiffness of right shoulder, not elsewhere classified: Secondary | ICD-10-CM | POA: Diagnosis not present

## 2014-12-19 DIAGNOSIS — M6281 Muscle weakness (generalized): Secondary | ICD-10-CM | POA: Diagnosis not present

## 2014-12-19 DIAGNOSIS — M19011 Primary osteoarthritis, right shoulder: Secondary | ICD-10-CM | POA: Diagnosis not present

## 2014-12-22 DIAGNOSIS — M25611 Stiffness of right shoulder, not elsewhere classified: Secondary | ICD-10-CM | POA: Diagnosis not present

## 2014-12-22 DIAGNOSIS — M6281 Muscle weakness (generalized): Secondary | ICD-10-CM | POA: Diagnosis not present

## 2014-12-22 DIAGNOSIS — M25512 Pain in left shoulder: Secondary | ICD-10-CM | POA: Diagnosis not present

## 2014-12-22 DIAGNOSIS — M19011 Primary osteoarthritis, right shoulder: Secondary | ICD-10-CM | POA: Diagnosis not present

## 2014-12-23 DIAGNOSIS — M25511 Pain in right shoulder: Secondary | ICD-10-CM | POA: Diagnosis not present

## 2014-12-23 DIAGNOSIS — M25512 Pain in left shoulder: Secondary | ICD-10-CM | POA: Diagnosis not present

## 2014-12-26 ENCOUNTER — Other Ambulatory Visit: Payer: Self-pay

## 2014-12-26 DIAGNOSIS — J0100 Acute maxillary sinusitis, unspecified: Secondary | ICD-10-CM | POA: Diagnosis not present

## 2014-12-29 DIAGNOSIS — M19011 Primary osteoarthritis, right shoulder: Secondary | ICD-10-CM | POA: Diagnosis not present

## 2014-12-29 DIAGNOSIS — M25512 Pain in left shoulder: Secondary | ICD-10-CM | POA: Diagnosis not present

## 2014-12-29 DIAGNOSIS — M25611 Stiffness of right shoulder, not elsewhere classified: Secondary | ICD-10-CM | POA: Diagnosis not present

## 2014-12-29 DIAGNOSIS — M6281 Muscle weakness (generalized): Secondary | ICD-10-CM | POA: Diagnosis not present

## 2015-01-01 ENCOUNTER — Encounter (HOSPITAL_COMMUNITY): Payer: Self-pay | Admitting: Emergency Medicine

## 2015-01-01 ENCOUNTER — Inpatient Hospital Stay (HOSPITAL_COMMUNITY)
Admission: EM | Admit: 2015-01-01 | Discharge: 2015-01-05 | DRG: 087 | Disposition: A | Discharge status: Home Health | Payer: Medicare Other | Attending: Internal Medicine | Admitting: Internal Medicine

## 2015-01-01 DIAGNOSIS — R51 Headache: Secondary | ICD-10-CM | POA: Diagnosis not present

## 2015-01-01 DIAGNOSIS — S0093XA Contusion of unspecified part of head, initial encounter: Secondary | ICD-10-CM | POA: Diagnosis not present

## 2015-01-01 DIAGNOSIS — F419 Anxiety disorder, unspecified: Secondary | ICD-10-CM | POA: Diagnosis present

## 2015-01-01 DIAGNOSIS — W1830XA Fall on same level, unspecified, initial encounter: Secondary | ICD-10-CM | POA: Diagnosis present

## 2015-01-01 DIAGNOSIS — S066XAA Traumatic subarachnoid hemorrhage with loss of consciousness status unknown, initial encounter: Secondary | ICD-10-CM

## 2015-01-01 DIAGNOSIS — I482 Chronic atrial fibrillation: Secondary | ICD-10-CM | POA: Diagnosis present

## 2015-01-01 DIAGNOSIS — J3089 Other allergic rhinitis: Secondary | ICD-10-CM | POA: Insufficient documentation

## 2015-01-01 DIAGNOSIS — I10 Essential (primary) hypertension: Secondary | ICD-10-CM | POA: Diagnosis present

## 2015-01-01 DIAGNOSIS — S065X0A Traumatic subdural hemorrhage without loss of consciousness, initial encounter: Secondary | ICD-10-CM | POA: Diagnosis not present

## 2015-01-01 DIAGNOSIS — S065X9A Traumatic subdural hemorrhage with loss of consciousness of unspecified duration, initial encounter: Secondary | ICD-10-CM | POA: Diagnosis present

## 2015-01-01 DIAGNOSIS — S066X9A Traumatic subarachnoid hemorrhage with loss of consciousness of unspecified duration, initial encounter: Secondary | ICD-10-CM

## 2015-01-01 DIAGNOSIS — I609 Nontraumatic subarachnoid hemorrhage, unspecified: Secondary | ICD-10-CM

## 2015-01-01 DIAGNOSIS — Z8673 Personal history of transient ischemic attack (TIA), and cerebral infarction without residual deficits: Secondary | ICD-10-CM

## 2015-01-01 DIAGNOSIS — R52 Pain, unspecified: Secondary | ICD-10-CM | POA: Diagnosis not present

## 2015-01-01 DIAGNOSIS — S02119A Unspecified fracture of occiput, initial encounter for closed fracture: Secondary | ICD-10-CM | POA: Diagnosis present

## 2015-01-01 DIAGNOSIS — I350 Nonrheumatic aortic (valve) stenosis: Secondary | ICD-10-CM | POA: Diagnosis present

## 2015-01-01 DIAGNOSIS — Z7901 Long term (current) use of anticoagulants: Secondary | ICD-10-CM

## 2015-01-01 DIAGNOSIS — F32A Depression, unspecified: Secondary | ICD-10-CM | POA: Insufficient documentation

## 2015-01-01 DIAGNOSIS — J45909 Unspecified asthma, uncomplicated: Secondary | ICD-10-CM | POA: Diagnosis present

## 2015-01-01 DIAGNOSIS — S066X0A Traumatic subarachnoid hemorrhage without loss of consciousness, initial encounter: Secondary | ICD-10-CM | POA: Diagnosis present

## 2015-01-01 DIAGNOSIS — S3991XA Unspecified injury of abdomen, initial encounter: Secondary | ICD-10-CM | POA: Diagnosis not present

## 2015-01-01 DIAGNOSIS — F329 Major depressive disorder, single episode, unspecified: Secondary | ICD-10-CM | POA: Diagnosis present

## 2015-01-01 DIAGNOSIS — S199XXA Unspecified injury of neck, initial encounter: Secondary | ICD-10-CM | POA: Diagnosis not present

## 2015-01-01 DIAGNOSIS — S065XAA Traumatic subdural hemorrhage with loss of consciousness status unknown, initial encounter: Secondary | ICD-10-CM

## 2015-01-01 DIAGNOSIS — W19XXXA Unspecified fall, initial encounter: Secondary | ICD-10-CM

## 2015-01-01 NOTE — ED Notes (Signed)
[place] collar placed in triage

## 2015-01-01 NOTE — ED Notes (Signed)
Pt reports falling about an hour ago at ground level. Knot to posterior head. Mechanical fall, was picking something up and states it was too heavy for her. Ambulatory at scene. Neck pain

## 2015-01-01 NOTE — ED Provider Notes (Signed)
CSN: [id number]     Arrival date & time 01/01/15  2331 History   First MD Initiated Contact with Patient 01/01/15 2347     This chart was scribed for Pamella Pert, MD by Forrestine Him, ED Scribe. This patient was seen in room A09C/A09C and the patient's care was started 11:50 PM.   Chief Complaint  Patient presents with  . Fall    Patient is a 81 y.o. female presenting with fall. The history is provided by the patient. No language interpreter was used.  Fall This is a new problem. Episode onset: 1-3 hours ago. Episode frequency: once. The problem has been resolved. Pertinent negatives include no chest pain, no abdominal pain and no headaches. Nothing aggravates the symptoms. Nothing relieves the symptoms. She has tried nothing for the symptoms. The treatment provided no relief.    HPI Comments: Caroline Myers is a 81 y.o. female with a PMHx of arthritis and stroke-07/15/2013 who presents to the Emergency Department here after a fall sustained at approximately 9:45 PM this evening. Pt states she was squatting down to pick something up from her bottom cabinet. As she was getting up to turn around, she fell back resulting in her hitting her head against the linoleum floor. No LOC at time of fall. She now c/o constant, ongoing neck pain, mild HA rated 5/10, and mild pain to her jaw. She states jaw pain is exacerbated when attempting to open her mouth. No alleviating factors at this time. Pt has also noted a knot to the back of her head. No OCT medications or home remedies attempted prior to arrival. No recent fever, chills, nausea, vomiting, chest pain, shortness of breath, or abdominal pain. No urinary issues at this time. Ms. Chaires is currently on Eliquis for a history of A-Fib. No known allergies to medications.  Past Medical History  Diagnosis Date  . Allergy     allergic rhinitis  . Cancer     history of skin cancer of the nose  . Asthma 2000    mild. tried Advair after bronchospasm after  exposure to cats  . Shoulder pain, left 05/2010    with rotator cuff tear--Dr Alfonso Ramus  . Paroxysmal atrial fibrillation     pt does not want anticoagulation  . Aortic stenosis     EF normal.  Mild aortic stenosis. Mild to moderate aortic regurgitation  . Anxiety   . Shortness of breath     occ  . GERD (gastroesophageal reflux disease)     occ  . Arthritis    Past Surgical History  Procedure Laterality Date  . Hernia repair  2003  . Acromioplasty  2000    right shoulder, arthroscopic  . Lumbar laminectomy/decompression microdiscectomy  04/01/2012    Procedure: LUMBAR LAMINECTOMY/DECOMPRESSION MICRODISCECTOMY 1 LEVEL;  Surgeon: Eustace Moore, MD;  Location: [facility];  Service: Neurosurgery;  Laterality: Right;  Right Lumbar four-five extraforaminal microdiskectomy    Family History  Problem Relation Age of Onset  . Heart attack Neg Hx   . Stroke Sister    History  Substance Use Topics  . Smoking status: Never Smoker   . Smokeless tobacco: Never Used     Comment: occ wine  . Alcohol Use: 0.0 oz/week    0 Standard drinks or equivalent per week     Comment: OCC   OB History    No data available     Review of Systems  Constitutional: Negative for appetite change and fatigue.  HENT: Negative for congestion, ear discharge and sinus pressure.   Eyes: Negative for discharge.  Respiratory: Negative for cough.   Cardiovascular: Negative for chest pain.  Gastrointestinal: Negative for abdominal pain and diarrhea.  Genitourinary: Negative for frequency and hematuria.  Musculoskeletal: Positive for arthralgias and neck pain. Negative for back pain.  Skin: Negative for rash.  Neurological: Negative for seizures, numbness and headaches.  Psychiatric/Behavioral: Negative for hallucinations.      Allergies  Tape  Home Medications   Prior to Admission medications   Medication Sig Start Date End Date Taking? Authorizing Provider  apixaban (ELIQUIS) 5 MG TABS tablet Take 1  tablet (5 mg total) by mouth 2 (two) times daily. 02/08/14   Jettie Booze, MD  Multiple Vitamins-Minerals (MULTIVITAMIN WITH MINERALS) tablet Take 1 tablet by mouth daily. ONE A DAY FOR WOMEN    Historical Provider, MD  Sertraline HCl (ZOLOFT PO) Take 40 mg by mouth daily.    Historical Provider, MD   Triage Vitals: BP 139/95 mmHg  Pulse 77  Temp(Src) 97.9 F (36.6 C) (Oral)  Resp 16  SpO2 95%   Physical Exam  Constitutional: She is oriented to person, place, and time. She appears well-developed and well-nourished. No distress.  HENT:  Mouth/Throat: No oropharyngeal exudate.  Mild hematoma and abrasion to the posterior aspect of scalp  Eyes: EOM are normal. Pupils are equal, round, and reactive to light.  Neck: Normal range of motion.  No vertebral tenderness Normal ROM of neck with pain  Cardiovascular: Normal rate, regular rhythm and normal heart sounds.   Pulmonary/Chest: Effort normal and breath sounds normal.  Abdominal: Soft. She exhibits no distension. There is no tenderness.  Musculoskeletal: Normal range of motion. She exhibits no edema or tenderness.  Normal ROM of hips without pain   Neurological: She is alert and oriented to person, place, and time.  alert, oriented x3 speech: normal in context and clarity memory: intact grossly cranial nerves II-XII: intact motor strength: full proximally and distally no involuntary movements or tremors sensation: intact to light touch diffusely  cerebellar: finger-to-nose and heel-to-shin intact gait: normal forwards and backwards  Skin: Skin is warm and dry.  Psychiatric: She has a normal mood and affect. Judgment normal.  Nursing note and vitals reviewed.   ED Course  Procedures (including critical care time)  DIAGNOSTIC STUDIES: Oxygen Saturation is 95% on RA, adequate by my interpretation.    COORDINATION OF CARE: 11:50 PM- Will give Tylenol. Will order CT maxillofacial without CM and CT head without contrast.  Discussed treatment plan with pt at bedside and pt agreed to plan.     Labs Review Labs Reviewed  COMPREHENSIVE METABOLIC PANEL - Abnormal; Notable for the following:    Glucose, Bld 107 (*)    Calcium 8.7 (*)    Total Protein 6.4 (*)    Albumin 3.2 (*)    All other components within normal limits  PROTIME-INR - Abnormal; Notable for the following:    Prothrombin Time 17.1 (*)    All other components within normal limits  CBC WITH DIFFERENTIAL/PLATELET  APTT  TYPE AND SCREEN  ABO/RH    Imaging Review Ct Head Wo Contrast  01/02/2015   CLINICAL DATA:  Fall with left facial pain. Headache. Initial encounter.  EXAM: CT HEAD WITHOUT CONTRAST  CT MAXILLOFACIAL WITHOUT CONTRAST  TECHNIQUE: Multidetector CT imaging of the head and maxillofacial structures were performed using the standard protocol without intravenous contrast. Multiplanar CT image reconstructions of the maxillofacial structures  were also generated.  COMPARISON:  12/21/2013  FINDINGS: CT HEAD FINDINGS  Skull and Sinuses:Nondepressed fracture of the right occipital bone, extending from the foramen magnum towards the occipital protuberance.  Orbits: Bilateral cataract resection.  No acute findings.  Brain: There is high-density extra-axial hematoma in the bilateral anterior cranial fossa, greater on the left. The hemorrhage is subarachnoid and likely also subdural. Small volume subarachnoid hemorrhage continues into left sylvian fissure. There is no associated mass effect.  Prominent brain atrophy, generalized. Moderate chronic small vessel disease with ischemic gliosis noted in the deep cerebral white matter. There has been a remote left cerebellar small-vessel infarct.  CT MAXILLOFACIAL FINDINGS  No evidence of acute facial fracture. No mandibular dislocation. No evidence of globe injury or postseptal hematoma. Patient is status post bilateral cataract resection.  Advanced cervical degenerative disc disease, especially at C3-4 to C5-6  (below there is nonvisualization) with endplate ridging and uncovertebral spurring causing foraminal stenoses. Prominent C2-3 facet arthropathy with anterolisthesis and moderate canal stenosis.  Critical Value/emergent results were called by telephone at the time of interpretation on 01/02/2015 at 12:46 am to Dr. Pamella Pert , who verbally acknowledged these results.  IMPRESSION: 1. Bilateral inferior frontal subarachnoid and subdural hemorrhage. No significant mass effect or shift. 2. Non depressed right occipital bone fracture. 3. No facial fracture. 4. Brain atrophy and chronic small vessel disease.   Electronically Signed   By: Monte Fantasia M.D.   On: 01/02/2015 00:50   Ct Cervical Spine Wo Contrast  01/02/2015   CLINICAL DATA:  Fall.  EXAM: CT CERVICAL SPINE WITHOUT CONTRAST  TECHNIQUE: Multidetector CT imaging of the cervical spine was performed without intravenous contrast. Multiplanar CT image reconstructions were also generated.  COMPARISON:  12/21/2013  FINDINGS: Known nondisplaced right occipital bone fracture, discussed on dedicated head imaging.  No evidence of acute fracture or acute/traumatic malalignment. No gross cervical canal hematoma or prevertebral edema.  Degenerative changes are advanced and diffuse, with severe disc narrowing and endplate irregularity from C4-5 to C6-7. Posterior endplate ridging with posterior longitudinal ligament ossification at C5 causes advanced spinal canal stenosis with a minimal residual AP canal diameter of 4 mm. From C3-4 to C6-7 uncovertebral spurs cause advanced foraminal stenosis on the left more than right. C2-3 anterolisthesis from advanced facet arthropathy, with bulging disc and dorsal ligamentous thickening causing moderate cord compression.  IMPRESSION: 1. No acute findings. 2. Advanced degenerative change with canal stenosis and cord compression at C2-3 and C5-6. 3. Nondepressed right occipital bone fracture.   Electronically Signed   By: Monte Fantasia M.D.   On: 01/02/2015 02:12   Ct Maxillofacial Wo Cm  01/02/2015   CLINICAL DATA:  Fall with left facial pain. Headache. Initial encounter.  EXAM: CT HEAD WITHOUT CONTRAST  CT MAXILLOFACIAL WITHOUT CONTRAST  TECHNIQUE: Multidetector CT imaging of the head and maxillofacial structures were performed using the standard protocol without intravenous contrast. Multiplanar CT image reconstructions of the maxillofacial structures were also generated.  COMPARISON:  12/21/2013  FINDINGS: CT HEAD FINDINGS  Skull and Sinuses:Nondepressed fracture of the right occipital bone, extending from the foramen magnum towards the occipital protuberance.  Orbits: Bilateral cataract resection.  No acute findings.  Brain: There is high-density extra-axial hematoma in the bilateral anterior cranial fossa, greater on the left. The hemorrhage is subarachnoid and likely also subdural. Small volume subarachnoid hemorrhage continues into left sylvian fissure. There is no associated mass effect.  Prominent brain atrophy, generalized. Moderate chronic small vessel disease with ischemic  gliosis noted in the deep cerebral white matter. There has been a remote left cerebellar small-vessel infarct.  CT MAXILLOFACIAL FINDINGS  No evidence of acute facial fracture. No mandibular dislocation. No evidence of globe injury or postseptal hematoma. Patient is status post bilateral cataract resection.  Advanced cervical degenerative disc disease, especially at C3-4 to C5-6 (below there is nonvisualization) with endplate ridging and uncovertebral spurring causing foraminal stenoses. Prominent C2-3 facet arthropathy with anterolisthesis and moderate canal stenosis.  Critical Value/emergent results were called by telephone at the time of interpretation on 01/02/2015 at 12:46 am to Dr. Pamella Pert , who verbally acknowledged these results.  IMPRESSION: 1. Bilateral inferior frontal subarachnoid and subdural hemorrhage. No significant mass effect or  shift. 2. Non depressed right occipital bone fracture. 3. No facial fracture. 4. Brain atrophy and chronic small vessel disease.   Electronically Signed   By: Monte Fantasia M.D.   On: 01/02/2015 00:50     EKG Interpretation   Date/Time:  Monday January 02 2015 00:54:09 EDT Ventricular Rate:  76 PR Interval:    QRS Duration: 93 QT Interval:  456 QTC Calculation: 513 R Axis:   -82 Text Interpretation:  Atrial fibrillation Left anterior fascicular block  Anteroseptal infarct, old Minimal ST depression, inferior leads Prolonged  QT interval Confirmed by Asyria Kolander  MD, Yaphet Smethurst ([id number]) on 01/02/2015 1:01:01  AM    CRITICAL CARE Performed by: Pamella Pert, S Total critical care time: 30 min Critical care time was exclusive of separately billable procedures and treating other patients. Critical care was necessary to treat or prevent imminent or life-threatening deterioration. Critical care was time spent personally by me on the following activities: development of treatment plan with patient and/or surrogate as well as nursing, discussions with consultants, evaluation of patient's response to treatment, examination of patient, obtaining history from patient or surrogate, ordering and performing treatments and interventions, ordering and review of laboratory studies, ordering and review of radiographic studies, pulse oximetry and re-evaluation of patient's condition.   MDM   Final diagnoses:  Fall  SAH (subarachnoid hemorrhage)  SDH (subdural hematoma)  Occipital fracture, closed, initial encounter    1:15 AM 81 y.o. female here after mechanical fall. Mild HA. Normal neuro exam. Did not have LOC. Found to have SAH, SDH, skull fx.   1:15 AM Discussed case with Dr. Sherwood Gambler who is on-call for neurosurgery. He agrees with administration of Eppie Gibson he recommends admission to the hospitalist service. The patient will need a repeat CT scan of her head on Tuesday morning. He plans to see her  tomorrow morning. She remained stable in the ED.    I personally performed the services described in this documentation, which was scribed in my presence. The recorded information has been reviewed and is accurate.    Pamella Pert, MD 01/02/15 [phone]

## 2015-01-02 ENCOUNTER — Emergency Department (HOSPITAL_COMMUNITY): Payer: Medicare Other

## 2015-01-02 ENCOUNTER — Encounter (HOSPITAL_COMMUNITY): Payer: Self-pay | Admitting: *Deleted

## 2015-01-02 DIAGNOSIS — S06310D Contusion and laceration of right cerebrum without loss of consciousness, subsequent encounter: Secondary | ICD-10-CM | POA: Diagnosis not present

## 2015-01-02 DIAGNOSIS — I10 Essential (primary) hypertension: Secondary | ICD-10-CM | POA: Diagnosis present

## 2015-01-02 DIAGNOSIS — S065XAA Traumatic subdural hemorrhage with loss of consciousness status unknown, initial encounter: Secondary | ICD-10-CM | POA: Diagnosis present

## 2015-01-02 DIAGNOSIS — W1830XA Fall on same level, unspecified, initial encounter: Secondary | ICD-10-CM | POA: Diagnosis present

## 2015-01-02 DIAGNOSIS — Z8673 Personal history of transient ischemic attack (TIA), and cerebral infarction without residual deficits: Secondary | ICD-10-CM | POA: Diagnosis not present

## 2015-01-02 DIAGNOSIS — S02119A Unspecified fracture of occiput, initial encounter for closed fracture: Secondary | ICD-10-CM | POA: Diagnosis not present

## 2015-01-02 DIAGNOSIS — I609 Nontraumatic subarachnoid hemorrhage, unspecified: Secondary | ICD-10-CM

## 2015-01-02 DIAGNOSIS — I6200 Nontraumatic subdural hemorrhage, unspecified: Secondary | ICD-10-CM | POA: Diagnosis not present

## 2015-01-02 DIAGNOSIS — S066X0A Traumatic subarachnoid hemorrhage without loss of consciousness, initial encounter: Secondary | ICD-10-CM | POA: Diagnosis present

## 2015-01-02 DIAGNOSIS — S065X9A Traumatic subdural hemorrhage with loss of consciousness of unspecified duration, initial encounter: Secondary | ICD-10-CM | POA: Diagnosis present

## 2015-01-02 DIAGNOSIS — S79911A Unspecified injury of right hip, initial encounter: Secondary | ICD-10-CM | POA: Diagnosis not present

## 2015-01-02 DIAGNOSIS — I482 Chronic atrial fibrillation: Secondary | ICD-10-CM

## 2015-01-02 DIAGNOSIS — S79912A Unspecified injury of left hip, initial encounter: Secondary | ICD-10-CM | POA: Diagnosis not present

## 2015-01-02 DIAGNOSIS — I350 Nonrheumatic aortic (valve) stenosis: Secondary | ICD-10-CM | POA: Diagnosis present

## 2015-01-02 DIAGNOSIS — S0093XA Contusion of unspecified part of head, initial encounter: Secondary | ICD-10-CM | POA: Diagnosis not present

## 2015-01-02 DIAGNOSIS — R51 Headache: Secondary | ICD-10-CM | POA: Diagnosis not present

## 2015-01-02 DIAGNOSIS — F419 Anxiety disorder, unspecified: Secondary | ICD-10-CM | POA: Diagnosis present

## 2015-01-02 DIAGNOSIS — M25551 Pain in right hip: Secondary | ICD-10-CM | POA: Diagnosis not present

## 2015-01-02 DIAGNOSIS — J45909 Unspecified asthma, uncomplicated: Secondary | ICD-10-CM | POA: Diagnosis present

## 2015-01-02 DIAGNOSIS — F329 Major depressive disorder, single episode, unspecified: Secondary | ICD-10-CM | POA: Diagnosis not present

## 2015-01-02 DIAGNOSIS — S199XXA Unspecified injury of neck, initial encounter: Secondary | ICD-10-CM | POA: Diagnosis not present

## 2015-01-02 DIAGNOSIS — S3991XA Unspecified injury of abdomen, initial encounter: Secondary | ICD-10-CM | POA: Diagnosis not present

## 2015-01-02 DIAGNOSIS — Z7901 Long term (current) use of anticoagulants: Secondary | ICD-10-CM | POA: Diagnosis not present

## 2015-01-02 DIAGNOSIS — M25552 Pain in left hip: Secondary | ICD-10-CM | POA: Diagnosis not present

## 2015-01-02 DIAGNOSIS — S06320D Contusion and laceration of left cerebrum without loss of consciousness, subsequent encounter: Secondary | ICD-10-CM | POA: Diagnosis not present

## 2015-01-02 DIAGNOSIS — S065X0A Traumatic subdural hemorrhage without loss of consciousness, initial encounter: Secondary | ICD-10-CM | POA: Diagnosis present

## 2015-01-02 DIAGNOSIS — I480 Paroxysmal atrial fibrillation: Secondary | ICD-10-CM | POA: Diagnosis not present

## 2015-01-02 DIAGNOSIS — S065X0D Traumatic subdural hemorrhage without loss of consciousness, subsequent encounter: Secondary | ICD-10-CM | POA: Diagnosis not present

## 2015-01-02 LAB — CBC WITH DIFFERENTIAL/PLATELET
BASOS ABS: 0.1 10*3/uL (ref 0.0–0.1)
Basophils Absolute: 0.1 10*3/uL (ref 0.0–0.1)
Basophils Relative: 1 % (ref 0–1)
Basophils Relative: 1 % (ref 0–1)
EOS ABS: 0.2 10*3/uL (ref 0.0–0.7)
EOS PCT: 3 % (ref 0–5)
Eosinophils Absolute: 0.3 10*3/uL (ref 0.0–0.7)
Eosinophils Relative: 4 % (ref 0–5)
HCT: 41.3 % (ref 36.0–46.0)
HCT: 41.5 % (ref 36.0–46.0)
Hemoglobin: 14.2 g/dL (ref 12.0–15.0)
Hemoglobin: 13.7 g/dL (ref 12.0–15.0)
Lymphocytes Relative: 13 % (ref 12–46)
Lymphocytes Relative: 18 % (ref 12–46)
Lymphs Abs: 1 10*3/uL (ref 0.7–4.0)
Lymphs Abs: 1.2 10*3/uL (ref 0.7–4.0)
MCH: 33.6 pg (ref 26.0–34.0)
MCH: 32.3 pg (ref 26.0–34.0)
MCHC: 34.4 g/dL (ref 30.0–36.0)
MCHC: 33 g/dL (ref 30.0–36.0)
MCV: 97.6 fL (ref 78.0–100.0)
MCV: 97.9 fL (ref 78.0–100.0)
MONO ABS: 0.9 10*3/uL (ref 0.1–1.0)
MONOS PCT: 11 % (ref 3–12)
Monocytes Absolute: 0.9 10*3/uL (ref 0.1–1.0)
Monocytes Relative: 13 % — ABNORMAL HIGH (ref 3–12)
NEUTROS PCT: 64 % (ref 43–77)
Neutro Abs: 5.9 10*3/uL (ref 1.7–7.7)
Neutro Abs: 4.4 10*3/uL (ref 1.7–7.7)
Neutrophils Relative %: 72 % (ref 43–77)
PLATELETS: 222 10*3/uL (ref 150–400)
PLATELETS: 208 10*3/uL (ref 150–400)
RBC: 4.23 MIL/uL (ref 3.87–5.11)
RBC: 4.24 MIL/uL (ref 3.87–5.11)
RDW: 13.2 % (ref 11.5–15.5)
RDW: 13.3 % (ref 11.5–15.5)
WBC: 8.1 10*3/uL (ref 4.0–10.5)
WBC: 6.8 10*3/uL (ref 4.0–10.5)

## 2015-01-02 LAB — COMPREHENSIVE METABOLIC PANEL
ALK PHOS: 94 U/L (ref 38–126)
ALT: 16 U/L (ref 14–54)
ALT: 15 U/L (ref 14–54)
AST: 23 U/L (ref 15–41)
AST: 22 U/L (ref 15–41)
Albumin: 3.2 g/dL — ABNORMAL LOW (ref 3.5–5.0)
Albumin: 3 g/dL — ABNORMAL LOW (ref 3.5–5.0)
Alkaline Phosphatase: 91 U/L (ref 38–126)
Anion gap: 9 (ref 5–15)
Anion gap: 9 (ref 5–15)
BUN: 19 mg/dL (ref 6–20)
BUN: 15 mg/dL (ref 6–20)
CALCIUM: 8.7 mg/dL — AB (ref 8.9–10.3)
CO2: 23 mmol/L (ref 22–32)
CO2: 28 mmol/L (ref 22–32)
CREATININE: 0.75 mg/dL (ref 0.44–1.00)
CREATININE: 0.77 mg/dL (ref 0.44–1.00)
Calcium: 8.5 mg/dL — ABNORMAL LOW (ref 8.9–10.3)
Chloride: 105 mmol/L (ref 101–111)
Chloride: 100 mmol/L — ABNORMAL LOW (ref 101–111)
GFR calc Af Amer: >60 mL/min (ref >60)
GFR calc Af Amer: >60 mL/min (ref >60)
GFR calc non Af Amer: >60 mL/min (ref >60)
Glucose, Bld: 107 mg/dL — ABNORMAL HIGH (ref 65–99)
Glucose, Bld: 98 mg/dL (ref 65–99)
POTASSIUM: 4.1 mmol/L (ref 3.5–5.1)
Potassium: 3.8 mmol/L (ref 3.5–5.1)
SODIUM: 137 mmol/L (ref 135–145)
Sodium: 137 mmol/L (ref 135–145)
Total Bilirubin: 0.6 mg/dL (ref 0.3–1.2)
Total Bilirubin: 0.7 mg/dL (ref 0.3–1.2)
Total Protein: 6.4 g/dL — ABNORMAL LOW (ref 6.5–8.1)
Total Protein: 6.3 g/dL — ABNORMAL LOW (ref 6.5–8.1)

## 2015-01-02 LAB — TSH: TSH: 1.682 u[IU]/mL (ref 0.350–4.500)

## 2015-01-02 LAB — TYPE AND SCREEN
ABO/RH(D): O POS
Antibody Screen: NEGATIVE

## 2015-01-02 LAB — PROTIME-INR
INR: 1.39 (ref 0.00–1.49)
Prothrombin Time: 17.1 seconds — ABNORMAL HIGH (ref 11.6–15.2)

## 2015-01-02 LAB — APTT: aPTT: 36 seconds (ref 24–37)

## 2015-01-02 LAB — ABO/RH: ABO/RH(D): O POS

## 2015-01-02 LAB — MRSA PCR SCREENING: MRSA by PCR: NEGATIVE

## 2015-01-02 MED ORDER — SODIUM CHLORIDE 0.9 % IJ SOLN
3.0000 mL | Freq: Two times a day (BID) | INTRAMUSCULAR | Status: DC
  Administered 2015-01-02 (×2): 3 mL via INTRAVENOUS
  Administered 2015-01-03: 11:00:00 via INTRAVENOUS
  Administered 2015-01-03 – 2015-01-05 (×2): 3 mL via INTRAVENOUS

## 2015-01-02 MED ORDER — ACETAMINOPHEN 650 MG RE SUPP: 650.0000 mg | Freq: Four times a day (QID) | RECTAL | Status: DC | PRN

## 2015-01-02 MED ORDER — ACETAMINOPHEN 325 MG PO TABS
650.0000 mg | ORAL_TABLET | Freq: Four times a day (QID) | ORAL | Status: DC | PRN
  Administered 2015-01-02 – 2015-01-04 (×4): 650 mg via ORAL
  Filled 2015-01-02 (×4): qty 2

## 2015-01-02 MED ORDER — SERTRALINE HCL 50 MG PO TABS
50.0000 mg | ORAL_TABLET | Freq: Every day | ORAL | Status: DC
  Administered 2015-01-02 – 2015-01-05 (×4): 50 mg via ORAL
  Filled 2015-01-02 (×4): qty 1

## 2015-01-02 MED ORDER — FLUTICASONE PROPIONATE 50 MCG/ACT NA SUSP
2.0000 | Freq: Two times a day (BID) | NASAL | Status: DC
  Administered 2015-01-03 – 2015-01-04 (×2): 2 via NASAL
  Filled 2015-01-02 (×2): qty 16

## 2015-01-02 MED ORDER — ALBUTEROL SULFATE (2.5 MG/3ML) 0.083% IN NEBU: 3.0000 mL | INHALATION_SOLUTION | Freq: Four times a day (QID) | RESPIRATORY_TRACT | Status: DC | PRN

## 2015-01-02 MED ORDER — ONDANSETRON HCL 4 MG PO TABS: 4.0000 mg | ORAL_TABLET | Freq: Four times a day (QID) | ORAL | Status: DC | PRN

## 2015-01-02 MED ORDER — LORATADINE 10 MG PO TABS
10.0000 mg | ORAL_TABLET | Freq: Every day | ORAL | Status: DC
  Administered 2015-01-03 – 2015-01-05 (×3): 10 mg via ORAL
  Filled 2015-01-02 (×5): qty 1

## 2015-01-02 MED ORDER — ACETAMINOPHEN 325 MG PO TABS
650.0000 mg | ORAL_TABLET | Freq: Once | ORAL | Status: AC
  Administered 2015-01-02: 650 mg via ORAL
  Filled 2015-01-02: qty 2

## 2015-01-02 MED ORDER — ONDANSETRON HCL 4 MG/2ML IJ SOLN: 4.0000 mg | Freq: Four times a day (QID) | INTRAMUSCULAR | Status: DC | PRN

## 2015-01-02 MED ORDER — PROTHROMBIN COMPLEX CONC HUMAN 500 UNITS IV KIT
3300.0000 [IU] | PACK | Status: AC
  Administered 2015-01-02: 3300 [IU] via INTRAVENOUS
  Filled 2015-01-02: qty 132

## 2015-01-02 NOTE — H&P (Signed)
[facility] History and Physical  TECKLA CHRISTIANSEN LNZ:[id number] DOB: May 06, 1924 DOA: 01/01/2015  Referring physician: Dr.Harrison. PCP:  Melinda Crutch, MD  Specialists: Dr.Varnasi. Cardiologist.  Chief Complaint: Fall with head injury.  HPI: Caroline Myers is a 81 y.o. female with history of CVA, chronic atrial fibrillation on Apixaban and asthma was brought to the ER after patient had fall at her house last evening. Patient states she was trying to pick something from the kitchen floor when she suddenly lost balance and fell. She fell towards the back and hit her occipital area. Patient did not lose consciousness. CT of the head shows nondepressed fracture of the occipital skull with bilateral subarachnoid hemorrhage and subdural hemorrhage. On-call neurosurgeon Dr. Sherwood Gambler was consulted and patient has been admitted for further observation. Patient is on Apixaban and was given [name] per pharmacy. Patient on exam appears nonfocal. Patient has had some headache out of 4 which improved with Tylenol. Patient denies any palpitations chest pain or shortness of breath.   Review of Systems: As presented in the history of presenting illness, rest negative.  Past Medical History  Diagnosis Date  . Allergy     allergic rhinitis  . Cancer     history of skin cancer of the nose  . Asthma 2000    mild. tried Advair after bronchospasm after exposure to cats  . Shoulder pain, left 05/2010    with rotator cuff tear--Dr Alfonso Ramus  . Paroxysmal atrial fibrillation     pt does not want anticoagulation  . Aortic stenosis     EF normal.  Mild aortic stenosis. Mild to moderate aortic regurgitation  . Anxiety   . Shortness of breath     occ  . GERD (gastroesophageal reflux disease)     occ  . Arthritis    Past Surgical History  Procedure Laterality Date  . Hernia repair  2003  . Acromioplasty  2000    right shoulder, arthroscopic  . Lumbar laminectomy/decompression microdiscectomy   04/01/2012    Procedure: LUMBAR LAMINECTOMY/DECOMPRESSION MICRODISCECTOMY 1 LEVEL;  Surgeon: Eustace Moore, MD;  Location: [facility];  Service: Neurosurgery;  Laterality: Right;  Right Lumbar four-five extraforaminal microdiskectomy    Social History:  reports that she has never smoked. She has never used smokeless tobacco. She reports that she drinks alcohol. She reports that she does not use illicit drugs. Where does patient live home. Can patient participate in ADLs? Yes.  Allergies  Allergen Reactions  . Tape Other (See Comments)    Rips skin    Family History:  Family History  Problem Relation Age of Onset  . Heart attack Neg Hx   . Stroke Sister       Prior to Admission medications   Medication Sig Start Date End Date Taking? Authorizing Provider  albuterol (PROVENTIL HFA;VENTOLIN HFA) 108 (90 BASE) MCG/ACT inhaler Inhale 1-2 puffs into the lungs every 6 (six) hours as needed for wheezing or shortness of breath.   Yes Historical Provider, MD  amoxicillin-clavulanate (AUGMENTIN) 875-125 MG per tablet Take 1 tablet by mouth 2 (two) times daily with a meal. 12/26/14  Yes Historical Provider, MD  apixaban (ELIQUIS) 5 MG TABS tablet Take 1 tablet (5 mg total) by mouth 2 (two) times daily. 02/08/14  Yes Jettie Booze, MD  fluticasone (FLONASE) 50 MCG/ACT nasal spray Place 2 sprays into both nostrils 2 (two) times daily. 12/26/14  Yes Historical Provider, MD  sertraline (ZOLOFT) 50 MG tablet Take 50 mg by  mouth daily. 11/24/14  Yes Historical Provider, MD    Physical Exam: Filed Vitals:   01/02/15 0248 01/02/15 0300 01/02/15 0345 01/02/15 0415  BP: 132/95 131/70 140/84 135/76  Pulse: 77 82 82 84  Temp:    98.2 F (36.8 C)  TempSrc:    Oral  Resp: 21 24 23 21   Height:    5\' 5"  (1.651 m)  Weight:    64.7 kg (142 lb 10.2 oz)  SpO2: 92% 91% 92% 92%     General:  Moderately built and nourished.  Eyes: Anicteric. No pallor.  ENT: No discharge from the ears eyes nose or  mouth. Occipital hematoma.  Neck: No mass felt. No neck rigidity.  Cardiovascular: S1 and S2 heard.  Respiratory: No rhonchi or crepitations.  Abdomen: Soft nontender bowel sounds present.  Skin: No rash.  Musculoskeletal: No edema.  Psychiatric: Appears normal.  Neurologic: Alert awake oriented to time place and person. Moves all extremities.  Labs on Admission:  Basic Metabolic Panel:  Recent Labs Lab 01/02/15 0124  NA 137  K 4.1  CL 105  CO2 23  GLUCOSE 107*  BUN 19  CREATININE 0.75  CALCIUM 8.7*   Liver Function Tests:  Recent Labs Lab 01/02/15 0124  AST 23  ALT 16  ALKPHOS 94  BILITOT 0.6  PROT 6.4*  ALBUMIN 3.2*   No results for input(s): LIPASE, AMYLASE in the last 168 hours. No results for input(s): AMMONIA in the last 168 hours. CBC:  Recent Labs Lab 01/02/15 0124  WBC 8.1  NEUTROABS 5.9  HGB 14.2  HCT 41.3  MCV 97.6  PLT 222   Cardiac Enzymes: No results for input(s): CKTOTAL, CKMB, CKMBINDEX, TROPONINI in the last 168 hours.  BNP (last 3 results) No results for input(s): BNP in the last 8760 hours.  ProBNP (last 3 results) No results for input(s): PROBNP in the last 8760 hours.  CBG: No results for input(s): GLUCAP in the last 168 hours.  Radiological Exams on Admission: Ct Head Wo Contrast  01/02/2015   CLINICAL DATA:  Fall with left facial pain. Headache. Initial encounter.  EXAM: CT HEAD WITHOUT CONTRAST  CT MAXILLOFACIAL WITHOUT CONTRAST  TECHNIQUE: Multidetector CT imaging of the head and maxillofacial structures were performed using the standard protocol without intravenous contrast. Multiplanar CT image reconstructions of the maxillofacial structures were also generated.  COMPARISON:  12/21/2013  FINDINGS: CT HEAD FINDINGS  Skull and Sinuses:Nondepressed fracture of the right occipital bone, extending from the foramen magnum towards the occipital protuberance.  Orbits: Bilateral cataract resection.  No acute findings.  Brain:  There is high-density extra-axial hematoma in the bilateral anterior cranial fossa, greater on the left. The hemorrhage is subarachnoid and likely also subdural. Small volume subarachnoid hemorrhage continues into left sylvian fissure. There is no associated mass effect.  Prominent brain atrophy, generalized. Moderate chronic small vessel disease with ischemic gliosis noted in the deep cerebral white matter. There has been a remote left cerebellar small-vessel infarct.  CT MAXILLOFACIAL FINDINGS  No evidence of acute facial fracture. No mandibular dislocation. No evidence of globe injury or postseptal hematoma. Patient is status post bilateral cataract resection.  Advanced cervical degenerative disc disease, especially at C3-4 to C5-6 (below there is nonvisualization) with endplate ridging and uncovertebral spurring causing foraminal stenoses. Prominent C2-3 facet arthropathy with anterolisthesis and moderate canal stenosis.  Critical Value/emergent results were called by telephone at the time of interpretation on 01/02/2015 at 12:46 am to Dr. Pamella Pert , who verbally  acknowledged these results.  IMPRESSION: 1. Bilateral inferior frontal subarachnoid and subdural hemorrhage. No significant mass effect or shift. 2. Non depressed right occipital bone fracture. 3. No facial fracture. 4. Brain atrophy and chronic small vessel disease.   Electronically Signed   By: Monte Fantasia M.D.   On: 01/02/2015 00:50   Ct Cervical Spine Wo Contrast  01/02/2015   CLINICAL DATA:  Fall.  EXAM: CT CERVICAL SPINE WITHOUT CONTRAST  TECHNIQUE: Multidetector CT imaging of the cervical spine was performed without intravenous contrast. Multiplanar CT image reconstructions were also generated.  COMPARISON:  12/21/2013  FINDINGS: Known nondisplaced right occipital bone fracture, discussed on dedicated head imaging.  No evidence of acute fracture or acute/traumatic malalignment. No gross cervical canal hematoma or prevertebral edema.   Degenerative changes are advanced and diffuse, with severe disc narrowing and endplate irregularity from C4-5 to C6-7. Posterior endplate ridging with posterior longitudinal ligament ossification at C5 causes advanced spinal canal stenosis with a minimal residual AP canal diameter of 4 mm. From C3-4 to C6-7 uncovertebral spurs cause advanced foraminal stenosis on the left more than right. C2-3 anterolisthesis from advanced facet arthropathy, with bulging disc and dorsal ligamentous thickening causing moderate cord compression.  IMPRESSION: 1. No acute findings. 2. Advanced degenerative change with canal stenosis and cord compression at C2-3 and C5-6. 3. Nondepressed right occipital bone fracture.   Electronically Signed   By: Monte Fantasia M.D.   On: 01/02/2015 02:12   Ct Maxillofacial Wo Cm  01/02/2015   CLINICAL DATA:  Fall with left facial pain. Headache. Initial encounter.  EXAM: CT HEAD WITHOUT CONTRAST  CT MAXILLOFACIAL WITHOUT CONTRAST  TECHNIQUE: Multidetector CT imaging of the head and maxillofacial structures were performed using the standard protocol without intravenous contrast. Multiplanar CT image reconstructions of the maxillofacial structures were also generated.  COMPARISON:  12/21/2013  FINDINGS: CT HEAD FINDINGS  Skull and Sinuses:Nondepressed fracture of the right occipital bone, extending from the foramen magnum towards the occipital protuberance.  Orbits: Bilateral cataract resection.  No acute findings.  Brain: There is high-density extra-axial hematoma in the bilateral anterior cranial fossa, greater on the left. The hemorrhage is subarachnoid and likely also subdural. Small volume subarachnoid hemorrhage continues into left sylvian fissure. There is no associated mass effect.  Prominent brain atrophy, generalized. Moderate chronic small vessel disease with ischemic gliosis noted in the deep cerebral white matter. There has been a remote left cerebellar small-vessel infarct.  CT  MAXILLOFACIAL FINDINGS  No evidence of acute facial fracture. No mandibular dislocation. No evidence of globe injury or postseptal hematoma. Patient is status post bilateral cataract resection.  Advanced cervical degenerative disc disease, especially at C3-4 to C5-6 (below there is nonvisualization) with endplate ridging and uncovertebral spurring causing foraminal stenoses. Prominent C2-3 facet arthropathy with anterolisthesis and moderate canal stenosis.  Critical Value/emergent results were called by telephone at the time of interpretation on 01/02/2015 at 12:46 am to Dr. Pamella Pert , who verbally acknowledged these results.  IMPRESSION: 1. Bilateral inferior frontal subarachnoid and subdural hemorrhage. No significant mass effect or shift. 2. Non depressed right occipital bone fracture. 3. No facial fracture. 4. Brain atrophy and chronic small vessel disease.   Electronically Signed   By: Monte Fantasia M.D.   On: 01/02/2015 00:50    EKG: Independently reviewed. Atrial fibrillation/flutter rate controlled.  Assessment/Plan Principal Problem:   SAH (subarachnoid hemorrhage) Active Problems:   Atrial fibrillation   SDH (subdural hematoma)   1. Bilateral subarachnoid and subdural hemorrhage  with nondepressed fracture of the occipital area status post mechanical fall - Dr. Sherwood Gambler, on-call neurosurgeon has been consulted. Dr. Sherwood Gambler is planning to repeat CT head in 24 hours. Patient will be admitted and closely observed and further recommendations per neurosurgeon. 2. C-spine stenosis with cord compression per CT scan - further recommendations per neurosurgery. 3. Chronic atrial fibrillation as needed control - Apixaban has been discontinued due to intracranial bleed. Patient does have a history of stroke and this was explained to patient and patient's husband about the risks. 4. History of stroke - presently Apixaban on hold due to intracranial bleed. 5. History of asthma presently not  wheezing.   DVT Prophylaxis SCDs. Code Status: Full code.  Family Communication: Patient's husband.  Disposition Plan: Admit to inpatient.    KAKRAKANDY,ARSHAD N. [facility] Pager [phone].  If 7PM-7AM, please contact night-coverage [email or web address] Password [email or web address] 01/02/2015, 5:10 AM

## 2015-01-02 NOTE — Consult Note (Signed)
Reason for Consult:  Occipital skull fracture, minimal frontal traumatic subarachnoid hemorrhage Referring Physician:  Dr. Pamella Myers (EDP)  Caroline Myers is an 81 y.o. right-handed white female. Her primary physician is Dr. Myriam Myers at [facility] on [address]. Her cardiologist is Dr. Casandra Myers.  HPI: Patient admitted last night after having fallen at home and struck the back of her head. She had significant pain in that region was evaluated in the emergency room at [facility]. CT of the head was obtained, and revealed a small linear occipital skull fracture as well as a minimal amount of frontal traumatic subarachnoid hemorrhage (coup contracoup type injury).  History is notable for atrial fibrillation for which she was anticoagulated with Caroline Myers.  Dr. Aline Myers prescribed Caroline Myers, and the patient was admitted to the triad hospitalist service for further care. Neurosurgical consultation was requested as well by Dr. Aline Myers who noted that the patient was neurologically intact and felt that the patient could be seen today.   Symptomatically she still has some mild discomfort in the occipital region, but denies any double vision, blurred vision, nausea, vomiting, weakness, or seizures. She does describe a history of breast stroke in January 2015 and since then has had some diminished balance as well as diminished memory. She reports that in addition to the fall last night she has also fallen another time over the past few months.  Past Medical History:  Past Medical History  Diagnosis Date  . Allergy     allergic rhinitis  . Cancer     history of skin cancer of the nose  . Asthma 2000    mild. tried Advair after bronchospasm after exposure to cats  . Shoulder pain, left 05/2010    with rotator cuff tear--Dr Caroline Myers  . Paroxysmal atrial fibrillation     pt does not want anticoagulation  . Aortic stenosis     EF normal.  Mild aortic stenosis. Mild to moderate aortic  regurgitation  . Anxiety   . Shortness of breath     occ  . GERD (gastroesophageal reflux disease)     occ  . Arthritis     Past Surgical History:  Past Surgical History  Procedure Laterality Date  . Hernia repair  2003  . Acromioplasty  2000    right shoulder, arthroscopic  . Lumbar laminectomy/decompression microdiscectomy  04/01/2012    Procedure: LUMBAR LAMINECTOMY/DECOMPRESSION MICRODISCECTOMY 1 LEVEL;  Surgeon: Caroline Moore, MD;  Location: [facility];  Service: Neurosurgery;  Laterality: Right;  Right Lumbar four-five extraforaminal microdiskectomy     Family History:  Family History  Problem Relation Age of Onset  . Heart attack Neg Hx   . Stroke Sister     Social History:  reports that she has never smoked. She has never used smokeless tobacco. She reports that she drinks alcohol. She reports that she does not use illicit drugs.  Allergies:  Allergies  Allergen Reactions  . Tape Other (See Comments)    Rips skin    Medications: I have reviewed the patient's current medications.  ROS:  Notable for those difficulties described inner history of present illness and past medical history, but is otherwise unremarkable.   Physical Examination: Patient is a well-developed, well-nourished white female in no acute distress.  She is sitting up eating her lunch.  Blood pressure 135/76, pulse 84, temperature 98.6 F (37 C), temperature source Oral, resp. rate 21, height 5' 5"  (1.651 m), weight 64.7 kg (142 lb 10.2  oz), SpO2 92 %. Lungs:  Clear to auscultation, symmetrical respiratory excursion. Heart:   Normal S1 and S2, no murmur. Abdomen:  Soft, nondistended, bowel sounds present. Extremity: No clubbing, cyanosis, or edema  Neurological Examination: Mental Status Examination:  Awake and alert, oriented to name, Caroline Myers hospital, and 01/02/2015. Speech is fluent, she has good comprehension, and follows commands briskly. Cranial Nerve Examination:  Pupils equal,  round, reactive to light. EOMI. Facial sensation intact. Facial movements symmetrical. Hearing present bilaterally. Shoulder shrug symmetrical. Tongue midline. Motor Examination:  5/5 strength in the upper and lower extremities. Sensory Examination:  Intact to pinprick throughout Reflex Examination: Symmetrical, toes downgoing.   Results for orders placed or performed during the hospital encounter of 01/01/15 (from the past 48 hour(s))  CBC with Differential/Platelet     Status: None   Collection Time: 01/02/15  1:24 AM  Result Value Ref Range   WBC 8.1 4.0 - 10.5 K/uL   RBC 4.23 3.87 - 5.11 MIL/uL   Hemoglobin 14.2 12.0 - 15.0 g/dL   HCT 41.3 36.0 - 46.0 %   MCV 97.6 78.0 - 100.0 fL   MCH 33.6 26.0 - 34.0 pg   MCHC 34.4 30.0 - 36.0 g/dL   RDW 13.2 11.5 - 15.5 %   Platelets 222 150 - 400 K/uL   Neutrophils Relative % 72 43 - 77 %   Neutro Abs 5.9 1.7 - 7.7 K/uL   Lymphocytes Relative 13 12 - 46 %   Lymphs Abs 1.0 0.7 - 4.0 K/uL   Monocytes Relative 11 3 - 12 %   Monocytes Absolute 0.9 0.1 - 1.0 K/uL   Eosinophils Relative 3 0 - 5 %   Eosinophils Absolute 0.2 0.0 - 0.7 K/uL   Basophils Relative 1 0 - 1 %   Basophils Absolute 0.1 0.0 - 0.1 K/uL  Comprehensive metabolic panel     Status: Abnormal   Collection Time: 01/02/15  1:24 AM  Result Value Ref Range   Sodium 137 135 - 145 mmol/L   Potassium 4.1 3.5 - 5.1 mmol/L   Chloride 105 101 - 111 mmol/L   CO2 23 22 - 32 mmol/L   Glucose, Bld 107 (H) 65 - 99 mg/dL   BUN 19 6 - 20 mg/dL   Creatinine, Ser 0.75 0.44 - 1.00 mg/dL   Calcium 8.7 (L) 8.9 - 10.3 mg/dL   Total Protein 6.4 (L) 6.5 - 8.1 g/dL   Albumin 3.2 (L) 3.5 - 5.0 g/dL   AST 23 15 - 41 U/L   ALT 16 14 - 54 U/L   Alkaline Phosphatase 94 38 - 126 U/L   Total Bilirubin 0.6 0.3 - 1.2 mg/dL   GFR calc non Af Amer >60 >60 mL/min   GFR calc Af Amer >60 >60 mL/min    Comment: (NOTE) The eGFR has been calculated using the CKD EPI equation. This calculation has not been  validated in all clinical situations. eGFR's persistently <60 mL/min signify possible Chronic Kidney Disease.    Anion gap 9 5 - 15  Protime-INR     Status: Abnormal   Collection Time: 01/02/15  1:24 AM  Result Value Ref Range   Prothrombin Time 17.1 (H) 11.6 - 15.2 seconds   INR 1.39 0.00 - 1.49  APTT     Status: None   Collection Time: 01/02/15  1:24 AM  Result Value Ref Range   aPTT 36 24 - 37 seconds  Type and screen     Status:  None   Collection Time: 01/02/15  1:36 AM  Result Value Ref Range   ABO/RH(D) O POS    Antibody Screen NEG    Sample Expiration 01/05/2015   ABO/Rh     Status: None   Collection Time: 01/02/15  1:36 AM  Result Value Ref Range   ABO/RH(D) O POS   MRSA PCR Screening     Status: None   Collection Time: 01/02/15  4:20 AM  Result Value Ref Range   MRSA by PCR NEGATIVE NEGATIVE    Comment:        The GeneXpert MRSA Assay (FDA approved for NASAL specimens only), is one component of a comprehensive MRSA colonization surveillance program. It is not intended to diagnose MRSA infection nor to guide or monitor treatment for MRSA infections.   Comprehensive metabolic panel     Status: Abnormal   Collection Time: 01/02/15  6:15 AM  Result Value Ref Range   Sodium 137 135 - 145 mmol/L   Potassium 3.8 3.5 - 5.1 mmol/L   Chloride 100 (L) 101 - 111 mmol/L   CO2 28 22 - 32 mmol/L   Glucose, Bld 98 65 - 99 mg/dL   BUN 15 6 - 20 mg/dL   Creatinine, Ser 0.77 0.44 - 1.00 mg/dL   Calcium 8.5 (L) 8.9 - 10.3 mg/dL   Total Protein 6.3 (L) 6.5 - 8.1 g/dL   Albumin 3.0 (L) 3.5 - 5.0 g/dL   AST 22 15 - 41 U/L   ALT 15 14 - 54 U/L   Alkaline Phosphatase 91 38 - 126 U/L   Total Bilirubin 0.7 0.3 - 1.2 mg/dL   GFR calc non Af Amer >60 >60 mL/min   GFR calc Af Amer >60 >60 mL/min    Comment: (NOTE) The eGFR has been calculated using the CKD EPI equation. This calculation has not been validated in all clinical situations. eGFR's persistently <60 mL/min signify  possible Chronic Kidney Disease.    Anion gap 9 5 - 15  CBC WITH DIFFERENTIAL     Status: Abnormal   Collection Time: 01/02/15  6:15 AM  Result Value Ref Range   WBC 6.8 4.0 - 10.5 K/uL   RBC 4.24 3.87 - 5.11 MIL/uL   Hemoglobin 13.7 12.0 - 15.0 g/dL   HCT 41.5 36.0 - 46.0 %   MCV 97.9 78.0 - 100.0 fL   MCH 32.3 26.0 - 34.0 pg   MCHC 33.0 30.0 - 36.0 g/dL   RDW 13.3 11.5 - 15.5 %   Platelets 208 150 - 400 K/uL   Neutrophils Relative % 64 43 - 77 %   Neutro Abs 4.4 1.7 - 7.7 K/uL   Lymphocytes Relative 18 12 - 46 %   Lymphs Abs 1.2 0.7 - 4.0 K/uL   Monocytes Relative 13 (H) 3 - 12 %   Monocytes Absolute 0.9 0.1 - 1.0 K/uL   Eosinophils Relative 4 0 - 5 %   Eosinophils Absolute 0.3 0.0 - 0.7 K/uL   Basophils Relative 1 0 - 1 %   Basophils Absolute 0.1 0.0 - 0.1 K/uL  TSH     Status: None   Collection Time: 01/02/15  6:15 AM  Result Value Ref Range   TSH 1.682 0.350 - 4.500 uIU/mL    Ct Head Wo Contrast  01/02/2015   CLINICAL DATA:  Fall with left facial pain. Headache. Initial encounter.  EXAM: CT HEAD WITHOUT CONTRAST  CT MAXILLOFACIAL WITHOUT CONTRAST  TECHNIQUE: Multidetector CT imaging of  the head and maxillofacial structures were performed using the standard protocol without intravenous contrast. Multiplanar CT image reconstructions of the maxillofacial structures were also generated.  COMPARISON:  12/21/2013  FINDINGS: CT HEAD FINDINGS  Skull and Sinuses:Nondepressed fracture of the right occipital bone, extending from the foramen magnum towards the occipital protuberance.  Orbits: Bilateral cataract resection.  No acute findings.  Brain: There is high-density extra-axial hematoma in the bilateral anterior cranial fossa, greater on the left. The hemorrhage is subarachnoid and likely also subdural. Small volume subarachnoid hemorrhage continues into left sylvian fissure. There is no associated mass effect.  Prominent brain atrophy, generalized. Moderate chronic small vessel disease  with ischemic gliosis noted in the deep cerebral white matter. There has been a remote left cerebellar small-vessel infarct.  CT MAXILLOFACIAL FINDINGS  No evidence of acute facial fracture. No mandibular dislocation. No evidence of globe injury or postseptal hematoma. Patient is status post bilateral cataract resection.  Advanced cervical degenerative disc disease, especially at C3-4 to C5-6 (below there is nonvisualization) with endplate ridging and uncovertebral spurring causing foraminal stenoses. Prominent C2-3 facet arthropathy with anterolisthesis and moderate canal stenosis.  Critical Value/emergent results were called by telephone at the time of interpretation on 01/02/2015 at 12:46 am to Dr. Pamella Myers , who verbally acknowledged these results.  IMPRESSION: 1. Bilateral inferior frontal subarachnoid and subdural hemorrhage. No significant mass effect or shift. 2. Non depressed right occipital bone fracture. 3. No facial fracture. 4. Brain atrophy and chronic small vessel disease.   Electronically Signed   By: Monte Fantasia M.D.   On: 01/02/2015 00:50   Ct Cervical Spine Wo Contrast  01/02/2015   CLINICAL DATA:  Fall.  EXAM: CT CERVICAL SPINE WITHOUT CONTRAST  TECHNIQUE: Multidetector CT imaging of the cervical spine was performed without intravenous contrast. Multiplanar CT image reconstructions were also generated.  COMPARISON:  12/21/2013  FINDINGS: Known nondisplaced right occipital bone fracture, discussed on dedicated head imaging.  No evidence of acute fracture or acute/traumatic malalignment. No gross cervical canal hematoma or prevertebral edema.  Degenerative changes are advanced and diffuse, with severe disc narrowing and endplate irregularity from C4-5 to C6-7. Posterior endplate ridging with posterior longitudinal ligament ossification at C5 causes advanced spinal canal stenosis with a minimal residual AP canal diameter of 4 mm. From C3-4 to C6-7 uncovertebral spurs cause advanced  foraminal stenosis on the left more than right. C2-3 anterolisthesis from advanced facet arthropathy, with bulging disc and dorsal ligamentous thickening causing moderate cord compression.  IMPRESSION: 1. No acute findings. 2. Advanced degenerative change with canal stenosis and cord compression at C2-3 and C5-6. 3. Nondepressed right occipital bone fracture.   Electronically Signed   By: Monte Fantasia M.D.   On: 01/02/2015 02:12   Ct Maxillofacial Wo Cm  01/02/2015   CLINICAL DATA:  Fall with left facial pain. Headache. Initial encounter.  EXAM: CT HEAD WITHOUT CONTRAST  CT MAXILLOFACIAL WITHOUT CONTRAST  TECHNIQUE: Multidetector CT imaging of the head and maxillofacial structures were performed using the standard protocol without intravenous contrast. Multiplanar CT image reconstructions of the maxillofacial structures were also generated.  COMPARISON:  12/21/2013  FINDINGS: CT HEAD FINDINGS  Skull and Sinuses:Nondepressed fracture of the right occipital bone, extending from the foramen magnum towards the occipital protuberance.  Orbits: Bilateral cataract resection.  No acute findings.  Brain: There is high-density extra-axial hematoma in the bilateral anterior cranial fossa, greater on the left. The hemorrhage is subarachnoid and likely also subdural. Small volume subarachnoid hemorrhage continues  into left sylvian fissure. There is no associated mass effect.  Prominent brain atrophy, generalized. Moderate chronic small vessel disease with ischemic gliosis noted in the deep cerebral white matter. There has been a remote left cerebellar small-vessel infarct.  CT MAXILLOFACIAL FINDINGS  No evidence of acute facial fracture. No mandibular dislocation. No evidence of globe injury or postseptal hematoma. Patient is status post bilateral cataract resection.  Advanced cervical degenerative disc disease, especially at C3-4 to C5-6 (below there is nonvisualization) with endplate ridging and uncovertebral spurring  causing foraminal stenoses. Prominent C2-3 facet arthropathy with anterolisthesis and moderate canal stenosis.  Critical Value/emergent results were called by telephone at the time of interpretation on 01/02/2015 at 12:46 am to Dr. Pamella Myers , who verbally acknowledged these results.  IMPRESSION: 1. Bilateral inferior frontal subarachnoid and subdural hemorrhage. No significant mass effect or shift. 2. Non depressed right occipital bone fracture. 3. No facial fracture. 4. Brain atrophy and chronic small vessel disease.   Electronically Signed   By: Monte Fantasia M.D.   On: 01/02/2015 00:50     Assessment/Plan: Patient admitted after having fallen at home and suffered a mild head injury, with an occipital skull fracture and a minimal amount of frontal traumatic subarachnoid hemorrhage. Notably the patient was on Caroline Myers and was given Caroline Myers by the emergency room staff.  She has remained neurologically stable. I've recommended follow-up CT the brain without contrast in the a.m. If that is stable, she will be able to be discharged to home with follow-up with her primary physician and her cardiologist. For now she'll need to continue off the Caroline Myers for at least 2 weeks. Ultimately and assessment of the benefit of anticoagulation with Caroline Myers versus her fall risk (which is concerning due not only  to the fall last night, but also due to previous falls).   Hosie Spangle, MD 01/02/2015, 1:36 PM

## 2015-01-02 NOTE — Progress Notes (Signed)
[facility] Admitting number paged to notify of pt arrival to [id number].

## 2015-01-02 NOTE — Progress Notes (Signed)
Patient seen and examined. Admitted after midnight secondary to mechanical fall with subsequent subdural hematoma and subarachnoid hemorrhage. No CP, no SOB. AAOX3 and w/o focal deficits on my exam. Patient was on apixaban for A. Fib (CHADsVASC score 6), on hold due to intracranial bleed. Please referred to H&P written by Dr. Hal Hope for further info/details on admission.  Plan: -continue observation and supportive care -follow neurosurgery rec's -repeat CT scan of head in 24 hours  Barton Dubois [phone]

## 2015-01-02 NOTE — ED Notes (Signed)
Attempted report 

## 2015-01-02 NOTE — Evaluation (Signed)
Physical Therapy Evaluation Patient Details Name: Caroline Myers MRN: [id number] DOB: 10-Jun-1924 Today's Date: 01/02/2015   History of Present Illness  Caroline Myers is a 81 y.o. female with history of CVA, chronic atrial fibrillation on Apixaban and asthma was brought to the ER after patient had fall at her house last evening. Patient states she was trying to pick something from the kitchen floor when she suddenly lost balance and fell. She fell towards the back and hit her occipital area. Patient did not lose consciousness. CT of the head shows nondepressed fracture of the occipital skull with bilateral subarachnoid hemorrhage and subdural hemorrhage. On-call neurosurgeon Dr. Sherwood Gambler was consulted and patient has been admitted for further observation.  Clinical Impression  Pt admitted with/for fall with skull fx and SAH/SDH.  Pt currently limited functionally due to the problems listed below.  (see problems list.)  Pt will benefit from PT to maximize function and safety to be able to get home safely with available assist of family.     Follow Up Recommendations Home health PT;Supervision for mobility/OOB    Equipment Recommendations  None recommended by PT    Recommendations for Other Services       Precautions / Restrictions Precautions Precautions: Fall      Mobility  Bed Mobility Overal bed mobility: Needs Assistance Bed Mobility: Supine to Sit;Sit to Supine     Supine to sit: Min guard Sit to supine: Supervision   General bed mobility comments: no assist or rail needed  Transfers Overall transfer level: Needs assistance Equipment used: None Transfers: Sit to/from Stand Sit to Stand: Min guard         General transfer comment: used bed frame for support  Ambulation/Gait Ambulation/Gait assistance: Min guard Ambulation Distance (Feet): 160 Feet Assistive device: None Gait Pattern/deviations: Scissoring;Drifts right/left Gait velocity: slower   General  Gait Details: mildly unsteady overall with scissoring and wanderingto maintain balance  Stairs            Wheelchair Mobility    Modified Rankin (Stroke Patients Only)       Balance Overall balance assessment: Needs assistance Sitting-balance support: No upper extremity supported Sitting balance-Leahy Scale: Fair     Standing balance support: No upper extremity supported Standing balance-Leahy Scale: Fair                               Pertinent Vitals/Pain Pain Assessment: Faces Faces Pain Scale: Hurts little more Pain Location: frontal HA Pain Descriptors / Indicators: Aching Pain Intervention(s): Monitored during session    Home Living Family/patient expects to be discharged to:: Private residence Living Arrangements: Spouse/significant other Available Help at Discharge: Family;Available 24 hours/day (daughter close by) Type of Home: House Home Access: Level entry     Home Layout: One level Home Equipment: Walker - 2 wheels;Cane - single point Additional Comments: pt ambulates without assistive device    Prior Function Level of Independence: Independent               Hand Dominance        Extremity/Trunk Assessment   Upper Extremity Assessment: Defer to OT evaluation           Lower Extremity Assessment: Overall WFL for tasks assessed (L proximal weakness)      Cervical / Trunk Assessment: Normal  Communication   Communication: No difficulties  Cognition Arousal/Alertness: Awake/alert Behavior During Therapy: WFL for tasks assessed/performed Overall Cognitive Status: Within  Functional Limits for tasks assessed                      General Comments      Exercises        Assessment/Plan    PT Assessment Patient needs continued PT services  PT Diagnosis Generalized weakness;Difficulty walking   PT Problem List Decreased strength;Decreased activity tolerance;Decreased balance;Decreased mobility  PT  Treatment Interventions Gait training;Functional mobility training;Therapeutic activities;Balance training;Patient/family education   PT Goals (Current goals can be found in the Care Plan section) Acute Rehab PT Goals Patient Stated Goal: home independent PT Goal Formulation: With patient Time For Goal Achievement: 01/09/15 Potential to Achieve Goals: Good    Frequency Min 3X/week   Barriers to discharge        Co-evaluation               End of Session   Activity Tolerance: Patient tolerated treatment well Patient left: in bed;with call bell/phone within reach           Time: 1319-1341 PT Time Calculation (min) (ACUTE ONLY): 22 min   Charges:   PT Evaluation $Initial PT Evaluation Tier I: 1 Procedure     PT G Codes:        Savva Beamer, Tessie Fass 01/02/2015, 1:50 PM 01/02/2015  Donnella Sham, PT [phone]  (pager)

## 2015-01-03 ENCOUNTER — Encounter (HOSPITAL_COMMUNITY): Payer: Self-pay | Admitting: Radiology

## 2015-01-03 ENCOUNTER — Inpatient Hospital Stay (HOSPITAL_COMMUNITY): Payer: Medicare Other

## 2015-01-03 DIAGNOSIS — F329 Major depressive disorder, single episode, unspecified: Secondary | ICD-10-CM | POA: Insufficient documentation

## 2015-01-03 DIAGNOSIS — S0210XA Unspecified fracture of base of skull, initial encounter for closed fracture: Secondary | ICD-10-CM

## 2015-01-03 DIAGNOSIS — S0630AA Unspecified focal traumatic brain injury with loss of consciousness status unknown, initial encounter: Secondary | ICD-10-CM

## 2015-01-03 DIAGNOSIS — S02119A Unspecified fracture of occiput, initial encounter for closed fracture: Secondary | ICD-10-CM | POA: Insufficient documentation

## 2015-01-03 DIAGNOSIS — I10 Essential (primary) hypertension: Secondary | ICD-10-CM | POA: Insufficient documentation

## 2015-01-03 DIAGNOSIS — J3089 Other allergic rhinitis: Secondary | ICD-10-CM | POA: Insufficient documentation

## 2015-01-03 DIAGNOSIS — S06309A Unspecified focal traumatic brain injury with loss of consciousness of unspecified duration, initial encounter: Secondary | ICD-10-CM

## 2015-01-03 DIAGNOSIS — I4891 Unspecified atrial fibrillation: Secondary | ICD-10-CM

## 2015-01-03 DIAGNOSIS — F32A Depression, unspecified: Secondary | ICD-10-CM | POA: Insufficient documentation

## 2015-01-03 HISTORY — DX: Unspecified focal traumatic brain injury with loss of consciousness of unspecified duration, initial encounter: S06.309A

## 2015-01-03 HISTORY — DX: Unspecified focal traumatic brain injury with loss of consciousness status unknown, initial encounter: S06.30AA

## 2015-01-03 LAB — VITAMIN B12: VITAMIN B 12: 753 pg/mL (ref 180–914)

## 2015-01-03 MED ORDER — ZOLPIDEM TARTRATE 5 MG PO TABS
5.0000 mg | ORAL_TABLET | Freq: Once | ORAL | Status: AC
  Administered 2015-01-03: 5 mg via ORAL
  Filled 2015-01-03: qty 1

## 2015-01-03 NOTE — Evaluation (Signed)
Occupational Therapy Evaluation Patient Details Name: Caroline Myers MRN: [id number] DOB: [birth year] Today's Date: 01/03/2015    History of Present Illness SHRAVYA WICKWIRE is a 81 y.o. female with history of CVA, chronic atrial fibrillation on Apixaban and asthma was brought to the ER after patient had fall at her house last evening. Patient states she was trying to pick something from the kitchen floor when she suddenly lost balance and fell. She fell towards the back and hit her occipital area. Patient did not lose consciousness. CT of the head shows nondepressed fracture of the occipital skull with bilateral subarachnoid hemorrhage and subdural hemorrhage. On-call neurosurgeon Dr. Sherwood Gambler was consulted and patient has been admitted for further observation.   Clinical Impression   Pt admitted with above. She demonstrates the below listed deficits and will benefit from continued OT to maximize safety and independence with BADLs.  Pt presents to OT with visual deficits (static images moving), impaired balance, and would benefit from further cognitive assessment.  Currently, she requires min guard assist for ADLs.  She has good family support.  Recommend OPOT     Follow Up Recommendations  Outpatient OT;Supervision/Assistance - 24 hour    Equipment Recommendations  None recommended by OT    Recommendations for Other Services       Precautions / Restrictions Precautions Precautions: Fall      Mobility Bed Mobility               General bed mobility comments: Pt ambulating in hallway with OT.  Transfers Overall transfer level: Needs assistance Equipment used: None Transfers: Sit to/from [facility] Sit to Stand: Min guard Stand pivot transfers: Min guard       General transfer comment: occasional LOB, but able to self correct     Balance Overall balance assessment: Needs assistance Sitting-balance support: Feet supported;No upper extremity  supported Sitting balance-Leahy Scale: Good     Standing balance support: During functional activity Standing balance-Leahy Scale: Good Standing balance comment: Pt able to bend toward ankles to simulate showering in standing                  Standardized Balance Assessment Standardized Balance Assessment : Dynamic Gait Index   Dynamic Gait Index Level Surface: Mild Impairment Change in Gait Speed: Moderate Impairment Gait with Horizontal Head Turns: Moderate Impairment Gait with Vertical Head Turns: Mild Impairment Gait and Pivot Turn: Mild Impairment Step Over Obstacle: Severe Impairment Step Around Obstacles: Mild Impairment Steps: Mild Impairment Total Score: 12      ADL Overall ADL's : Needs assistance/impaired Eating/Feeding: Independent;Sitting   Grooming: Wash/dry hands;Wash/dry face;Oral care;Supervision/safety;Standing   Upper Body Bathing: Set up;Sitting;Standing;Min guard   Lower Body Bathing: Sit to/from stand;Min guard   Upper Body Dressing : Set up;Sitting   Lower Body Dressing: Sit to/from stand;Min guard   Toilet Transfer: Ambulation;Comfort height toilet;Regular Toilet;Grab bars;Min guard   Toileting- Clothing Manipulation and Hygiene: Sit to/from stand;Min guard       Functional mobility during ADLs: Supervision/safety;Min guard General ADL Comments: Pt able to simulate shower in standing without LOB      Vision Vision Assessment?: Yes Eye Alignment: Within Functional Limits Ocular Range of Motion: Within Functional Limits Alignment/Gaze Preference: Within Defined Limits Tracking/Visual Pursuits: Able to track stimulus in all quads without difficulty Saccades: Other (comment) Visual Fields: No apparent deficits Additional Comments: Pt reports seeing objects moving at times, i.e. seeing ants on the floor where there was a scuff mark, she saw  waves moving on a static picture of the ocean.  She mistook a round metal plate on the floor for  a coaster.  While testing saccades, she reports the fingertip on the Rt was moving occasionally, then stated the lines on the pad were moving and there were letters on the finger pad that were moving    Perception Perception Perception Tested?: Yes   Praxis      Pertinent Vitals/Pain Pain Assessment: No/denies pain     Hand Dominance Right   Extremity/Trunk Assessment Upper Extremity Assessment Upper Extremity Assessment: Overall WFL for tasks assessed   Lower Extremity Assessment Lower Extremity Assessment: Defer to PT evaluation   Cervical / Trunk Assessment Cervical / Trunk Assessment: Normal   Communication Communication Communication: No difficulties   Cognition Arousal/Alertness: Awake/alert Behavior During Therapy: WFL for tasks assessed/performed Overall Cognitive Status: Within Functional Limits for tasks assessed                     General Comments       Exercises       Shoulder Instructions      Home Living Family/patient expects to be discharged to:: Private residence Living Arrangements: Spouse/significant other Available Help at Discharge: Family;Available 24 hours/day (daughter close by) Type of Home: House Home Access: Level entry     Home Layout: One level     Bathroom Shower/Tub: Tub/shower unit Shower/tub characteristics: Curtain Biochemist, clinical: Standard     Home Equipment: Environmental consultant - 2 wheels;Cane - single point   Additional Comments: pt ambulates without assistive device      Prior Functioning/Environment Level of Independence: Independent        Comments: Pt reports falls ~1x/year     OT Diagnosis: Generalized weakness;Disturbance of vision;Cognitive deficits   OT Problem List: Impaired balance (sitting and/or standing);Impaired vision/perception;Decreased cognition   OT Treatment/Interventions: Self-care/ADL training;DME and/or AE instruction;Therapeutic activities;Cognitive  remediation/compensation;Visual/perceptual remediation/compensation;Patient/family education;Balance training    OT Goals(Current goals can be found in the care plan section) Acute Rehab OT Goals Patient Stated Goal: to go home  OT Goal Formulation: With patient Time For Goal Achievement: 01/17/15 Potential to Achieve Goals: Good ADL Goals Pt Will Perform Grooming: with modified independence;standing Pt Will Perform Upper Body Bathing: with modified independence;standing Pt Will Perform Lower Body Bathing: with modified independence;sit to/from stand Pt Will Perform Upper Body Dressing: with modified independence;sitting Pt Will Perform Lower Body Dressing: with modified independence;sit to/from stand Pt Will Transfer to Toilet: with modified independence;ambulating;regular height toilet;grab bars Pt Will Perform Toileting - Clothing Manipulation and hygiene: with modified independence;sit to/from stand Additional ADL Goal #1: Pt will perform path finding with supervision Additional ADL Goal #2: Pt will participate in further cognitive assessment.   OT Frequency: Min 2X/week   Barriers to D/C:            Co-evaluation              End of Session Nurse Communication: Mobility status  Activity Tolerance: Patient tolerated treatment well Patient left: Other (comment) (with PT )   Time: 2542-7062 OT Time Calculation (min): 32 min Charges:  OT General Charges $OT Visit: 1 Procedure OT Evaluation $Initial OT Evaluation Tier I: 1 Procedure OT Treatments $Self Care/Home Management : 8-22 mins G-Codes:    Jerrilynn Mikowski M 01-28-15, 3:21 PM

## 2015-01-03 NOTE — Progress Notes (Signed)
Patient is experiencing spels of confusion and anxiety also visual and auditory hallucinations contacted attending he feels this is not related to hemorraghic evolution but lack of rest. Will continue to minitor.

## 2015-01-03 NOTE — Progress Notes (Signed)
Physical Therapy Treatment Patient Details Name: Caroline Myers MRN: [id number] DOB: Jul 13, 1923 Today's Date: 01/03/2015    History of Present Illness Caroline Myers is a 81 y.o. female with history of CVA, chronic atrial fibrillation on Apixaban and asthma was brought to the ER after patient had fall at her house last evening. Patient states she was trying to pick something from the kitchen floor when she suddenly lost balance and fell. She fell towards the back and hit her occipital area. Patient did not lose consciousness. CT of the head shows nondepressed fracture of the occipital skull with bilateral subarachnoid hemorrhage and subdural hemorrhage. On-call neurosurgeon Dr. Sherwood Gambler was consulted and patient has been admitted for further observation.    PT Comments    Patient progressing slowly towards PT goals. Pt continues to have balance deficits esp noted during higher level balance challenges- head turns, changes in direction, changes in gait speed etc. Pt high falls risk per score on DGI. Requires Min A for safety. Would benefit from use of RW for safe ambulation at home. Will continue to follow to maximize independence and mobility.   Follow Up Recommendations  Home health PT;Supervision/Assistance - 24 hour     Equipment Recommendations  None recommended by PT    Recommendations for Other Services       Precautions / Restrictions Precautions Precautions: Fall    Mobility  Bed Mobility Overal bed mobility: Needs Assistance Bed Mobility: Supine to Sit     Supine to sit: Min guard (with rail)     General bed mobility comments: Pt ambulating in hallway with OT.  Transfers Overall transfer level: Needs assistance Equipment used: None Transfers: Sit to/from Stand Sit to Stand: Min guard         General transfer comment: Cues to reach back for chair.  Ambulation/Gait Ambulation/Gait assistance: Min assist Ambulation Distance (Feet): 250 Feet Assistive  device: None Gait Pattern/deviations: Step-through pattern;Drifts right/left;Staggering right;Staggering left;Scissoring   Gait velocity interpretation: Below normal speed for age/gender General Gait Details: Very unsteady with scrissoring gait and drifting noted esp with head turns or higher level balance challenges.    Stairs Stairs: Yes Stairs assistance: Min assist Stair Management: Alternating pattern;Forwards;Two rails Number of Stairs: 3 (+ 2 steps x2 bouts) General stair comments: Cues for technique.  Wheelchair Mobility    Modified Rankin (Stroke Patients Only)       Balance Overall balance assessment: Needs assistance Sitting-balance support: Feet supported;No upper extremity supported Sitting balance-Leahy Scale: Good     Standing balance support: During functional activity Standing balance-Leahy Scale: Fair Standing balance comment: can maintain steady stance, but prefers to hold to bed rail, etc.                    Cognition Arousal/Alertness: Awake/alert Behavior During Therapy: WFL for tasks assessed/performed Overall Cognitive Status: Within Functional Limits for tasks assessed                      Exercises     General Comments General comments (skin integrity, edema, etc.):       Pertinent Vitals/Pain Pain Assessment: No/denies pain Faces Pain Scale: Hurts little more Pain Location: "tailbone" and back of head Pain Descriptors / Indicators: Sore;Aching Pain Intervention(s): Monitored during session    Home Living                      Prior Function  PT Goals (current goals can now be found in the care plan section) Acute Rehab PT Goals Patient Stated Goal: home independent PT Goal Formulation: With patient Time For Goal Achievement: 01/09/15 Potential to Achieve Goals: Good Progress towards PT goals: Progressing toward goals    Frequency  Min 3X/week    PT Plan Current plan remains appropriate     Co-evaluation             End of Session Equipment Utilized During Treatment: Gait belt Activity Tolerance: Patient tolerated treatment well Patient left: in chair;with call bell/phone within reach;with chair alarm set     Time: 1347-1405 PT Time Calculation (min) (ACUTE ONLY): 18 min  Charges:  $Gait Training: 8-22 mins      G Codes:      Nyasha Rahilly A Enrique Weiss 01/03/2015, 2:40 PM Wray Kearns, [place], DPT [phone]

## 2015-01-03 NOTE — Progress Notes (Signed)
Report called to Remo Lipps, RN on [employer]. Pt VSS, alert&oriented at baseline. All personal belongings with pt. CCMD and Elink notified, monitor removed. Pt transferred to [id number]

## 2015-01-03 NOTE — Progress Notes (Signed)
[facility] PROGRESS NOTE  LESHAE MCCLAY EYC:[id number] DOB: 1924/05/06 DOA: 01/01/2015 PCP:  Melinda Crutch, MD  Assessment/Plan: 1-fall: mechanical and w/o syncope or near syncope episode. -patient reports she slipped and fall -no prodromic symptoms  -will follow PT/OT rec's  2-occipital skull fracture: with subdural hematomas and SAH -no focal neurologic deficit appreciated on exam -patient off anticoagulation -neurosurgery on board, will follow rec's -currently repeat CT with some progression in subdural hematomas and SAH size, but now mass effect -will repeat CT in 2 days and decide further   3-atrial fibrillation: rate controlled -holding apixaban currently due to [facility] and subdural hematomas  4-hx of asthma: no wheezing -will monitor -continue PRN albuterol  5-allergic rhinitis:  -will continue claritin and flonase -patient just finished abx's therapy for sinusitis prior to admisison  6-depression: will continue zoloft  7-physical deconditioning and off balance:  -will check B12 level -follow PT/OT rec's -normal TSH  8-  Code Status: Full Family Communication: husband (Mr.Santiesteban by phone) Disposition Plan: will transfer to med-surg bed; repeat CT head w/o contrast in 2 days to follow progression of subdural hematomas and SAH.   Consultants:  Dr. Sherwood Gambler   Procedures:  See below for x-ray reports   Antibiotics:  None   HPI/Subjective: Afebrile, no CP, no SOB. Reports some HA's and feeling off balance.  Objective: Filed Vitals:   01/03/15 0710  BP:   Pulse:   Temp: 98 F (36.7 C)  Resp:     Intake/Output Summary (Last 24 hours) at 01/03/15 0847 Last data filed at 01/03/15 0405  Gross per 24 hour  Intake    243 ml  Output      0 ml  Net    243 ml   Filed Weights   01/02/15 0100 01/02/15 0415  Weight: 65.3 kg (143 lb 15.4 oz) 64.7 kg (142 lb 10.2 oz)    Exam:   General:  Afebrile, no CP or SOB. Patient reports still having some  HA's. AAOX3  Cardiovascular: rate controlled, no rubs or gallops  Respiratory: CTA bilaterally  Abdomen: soft, NT, ND, positive BS  Musculoskeletal: no edema or cyanosis  Neuro: no aphasia, no nystagmus, no focal motor deficit. Patient reports some HA's and endorses feeling off balance (which is not exactly new, but slightly worse since last fall)  Data Reviewed: Basic Metabolic Panel:  Recent Labs Lab 01/02/15 0124 01/02/15 0615  NA 137 137  K 4.1 3.8  CL 105 100*  CO2 23 28  GLUCOSE 107* 98  BUN 19 15  CREATININE 0.75 0.77  CALCIUM 8.7* 8.5*   Liver Function Tests:  Recent Labs Lab 01/02/15 0124 01/02/15 0615  AST 23 22  ALT 16 15  ALKPHOS 94 91  BILITOT 0.6 0.7  PROT 6.4* 6.3*  ALBUMIN 3.2* 3.0*   CBC:  Recent Labs Lab 01/02/15 0124 01/02/15 0615  WBC 8.1 6.8  NEUTROABS 5.9 4.4  HGB 14.2 13.7  HCT 41.3 41.5  MCV 97.6 97.9  PLT 222 208   Cardiac Enzymes: No results for input(s): CKTOTAL, CKMB, CKMBINDEX, TROPONINI in the last 168 hours. BNP (last 3 results) No results for input(s): BNP in the last 8760 hours.  ProBNP (last 3 results) No results for input(s): PROBNP in the last 8760 hours.  CBG: No results for input(s): GLUCAP in the last 168 hours.  Recent Results (from the past 240 hour(s))  MRSA PCR Screening     Status: None   Collection Time: 01/02/15  4:20 AM  Result Value Ref Range Status   MRSA by PCR NEGATIVE NEGATIVE Final    Comment:        The GeneXpert MRSA Assay (FDA approved for NASAL specimens only), is one component of a comprehensive MRSA colonization surveillance program. It is not intended to diagnose MRSA infection nor to guide or monitor treatment for MRSA infections.      Studies: Ct Head Wo Contrast  01/03/2015   CLINICAL DATA:  Follow-up subarachnoid hemorrhage after injury.  EXAM: CT HEAD WITHOUT CONTRAST  TECHNIQUE: Contiguous axial images were obtained from the base of the skull through the vertex without  intravenous contrast.  COMPARISON:  CT head January 02, 2015  FINDINGS: Interval blossoming of RIGHT inferior frontal lobe with 10 x 20 mm intraparenchymal hematoma, LEFT frontal lobe round 8 mm hematoma. 3 mm subdural hematoma along the anterior falx. LEFT greater than RIGHT anterior cranial fossa extra-axial blood products. Stable cerebral spinal fluid equivalent LEFT 5 mm extra-axial space. Old small LEFT cerebellar infarct. Ventricles and sulci are overall normal for patient's age. Patchy supratentorial white matter hypodensities compatible with chronic small vessel ischemic disease. No midline shift. Basal cisterns are patent. Moderate calcific atherosclerosis of the carotid siphons.  Patient is osteopenic. Nondisplaced RIGHT occipital skull fracture. Status post bilateral ocular lens implants.  IMPRESSION: Interval blossoming of RIGHT greater than LEFT inferior frontal lobe hemorrhagic contusions measuring up to 10 x 20 mm. Anterior cranial fossa subarachnoid blood, and suspected small LEFT subdural hematoma, in addition to 3 mm subdural hematoma along the anterior falx.  Nondisplaced occipital skull fracture.  Stable chronic changes, including moderate chronic small vessel ischemic disease, old small LEFT cerebellar infarct.   Electronically Signed   By: Elon Alas M.D.   On: 01/03/2015 06:34   Ct Head Wo Contrast  01/02/2015   CLINICAL DATA:  Fall with left facial pain. Headache. Initial encounter.  EXAM: CT HEAD WITHOUT CONTRAST  CT MAXILLOFACIAL WITHOUT CONTRAST  TECHNIQUE: Multidetector CT imaging of the head and maxillofacial structures were performed using the standard protocol without intravenous contrast. Multiplanar CT image reconstructions of the maxillofacial structures were also generated.  COMPARISON:  12/21/2013  FINDINGS: CT HEAD FINDINGS  Skull and Sinuses:Nondepressed fracture of the right occipital bone, extending from the foramen magnum towards the occipital protuberance.  Orbits:  Bilateral cataract resection.  No acute findings.  Brain: There is high-density extra-axial hematoma in the bilateral anterior cranial fossa, greater on the left. The hemorrhage is subarachnoid and likely also subdural. Small volume subarachnoid hemorrhage continues into left sylvian fissure. There is no associated mass effect.  Prominent brain atrophy, generalized. Moderate chronic small vessel disease with ischemic gliosis noted in the deep cerebral white matter. There has been a remote left cerebellar small-vessel infarct.  CT MAXILLOFACIAL FINDINGS  No evidence of acute facial fracture. No mandibular dislocation. No evidence of globe injury or postseptal hematoma. Patient is status post bilateral cataract resection.  Advanced cervical degenerative disc disease, especially at C3-4 to C5-6 (below there is nonvisualization) with endplate ridging and uncovertebral spurring causing foraminal stenoses. Prominent C2-3 facet arthropathy with anterolisthesis and moderate canal stenosis.  Critical Value/emergent results were called by telephone at the time of interpretation on 01/02/2015 at 12:46 am to Dr. Pamella Pert , who verbally acknowledged these results.  IMPRESSION: 1. Bilateral inferior frontal subarachnoid and subdural hemorrhage. No significant mass effect or shift. 2. Non depressed right occipital bone fracture. 3. No facial fracture. 4. Brain atrophy and chronic small vessel disease.  Electronically Signed   By: Monte Fantasia M.D.   On: 01/02/2015 00:50   Ct Cervical Spine Wo Contrast  01/02/2015   CLINICAL DATA:  Fall.  EXAM: CT CERVICAL SPINE WITHOUT CONTRAST  TECHNIQUE: Multidetector CT imaging of the cervical spine was performed without intravenous contrast. Multiplanar CT image reconstructions were also generated.  COMPARISON:  12/21/2013  FINDINGS: Known nondisplaced right occipital bone fracture, discussed on dedicated head imaging.  No evidence of acute fracture or acute/traumatic malalignment.  No gross cervical canal hematoma or prevertebral edema.  Degenerative changes are advanced and diffuse, with severe disc narrowing and endplate irregularity from C4-5 to C6-7. Posterior endplate ridging with posterior longitudinal ligament ossification at C5 causes advanced spinal canal stenosis with a minimal residual AP canal diameter of 4 mm. From C3-4 to C6-7 uncovertebral spurs cause advanced foraminal stenosis on the left more than right. C2-3 anterolisthesis from advanced facet arthropathy, with bulging disc and dorsal ligamentous thickening causing moderate cord compression.  IMPRESSION: 1. No acute findings. 2. Advanced degenerative change with canal stenosis and cord compression at C2-3 and C5-6. 3. Nondepressed right occipital bone fracture.   Electronically Signed   By: Monte Fantasia M.D.   On: 01/02/2015 02:12   Ct Maxillofacial Wo Cm  01/02/2015   CLINICAL DATA:  Fall with left facial pain. Headache. Initial encounter.  EXAM: CT HEAD WITHOUT CONTRAST  CT MAXILLOFACIAL WITHOUT CONTRAST  TECHNIQUE: Multidetector CT imaging of the head and maxillofacial structures were performed using the standard protocol without intravenous contrast. Multiplanar CT image reconstructions of the maxillofacial structures were also generated.  COMPARISON:  12/21/2013  FINDINGS: CT HEAD FINDINGS  Skull and Sinuses:Nondepressed fracture of the right occipital bone, extending from the foramen magnum towards the occipital protuberance.  Orbits: Bilateral cataract resection.  No acute findings.  Brain: There is high-density extra-axial hematoma in the bilateral anterior cranial fossa, greater on the left. The hemorrhage is subarachnoid and likely also subdural. Small volume subarachnoid hemorrhage continues into left sylvian fissure. There is no associated mass effect.  Prominent brain atrophy, generalized. Moderate chronic small vessel disease with ischemic gliosis noted in the deep cerebral white matter. There has been a  remote left cerebellar small-vessel infarct.  CT MAXILLOFACIAL FINDINGS  No evidence of acute facial fracture. No mandibular dislocation. No evidence of globe injury or postseptal hematoma. Patient is status post bilateral cataract resection.  Advanced cervical degenerative disc disease, especially at C3-4 to C5-6 (below there is nonvisualization) with endplate ridging and uncovertebral spurring causing foraminal stenoses. Prominent C2-3 facet arthropathy with anterolisthesis and moderate canal stenosis.  Critical Value/emergent results were called by telephone at the time of interpretation on 01/02/2015 at 12:46 am to Dr. Pamella Pert , who verbally acknowledged these results.  IMPRESSION: 1. Bilateral inferior frontal subarachnoid and subdural hemorrhage. No significant mass effect or shift. 2. Non depressed right occipital bone fracture. 3. No facial fracture. 4. Brain atrophy and chronic small vessel disease.   Electronically Signed   By: Monte Fantasia M.D.   On: 01/02/2015 00:50    Scheduled Meds: . fluticasone  2 spray Each Nare BID  . loratadine  10 mg Oral Daily  . sertraline  50 mg Oral Daily  . sodium chloride  3 mL Intravenous Q12H   Continuous Infusions:   Principal Problem:   SAH (subarachnoid hemorrhage) Active Problems:   Atrial fibrillation   SDH (subdural hematoma)    Time spent: 35 minutes    Barton Dubois  [facility] Pager  [phone]. If 7PM-7AM, please contact night-coverage at [email or web address], password [email or web address] 01/03/2015, 8:47 AM  LOS: 1 day

## 2015-01-03 NOTE — Progress Notes (Signed)
UR COMPLETED  

## 2015-01-03 NOTE — Progress Notes (Signed)
Patient arrived from [place] around 1200 with daughter and husband, pain was rated at 4 on a 0-10 scale other wise patients' vital signs and assessment were unremarkable she is working with PT and OT today and I await their report. Will continue to monitor.

## 2015-01-03 NOTE — Progress Notes (Signed)
Physical Therapy Treatment Patient Details Name: KIT BRUBACHER MRN: [id number] DOB: [birth year] Today's Date: 01/03/2015    History of Present Illness Caroline Myers is a 81 y.o. female with history of CVA, chronic atrial fibrillation on Apixaban and asthma was brought to the ER after patient had fall at her house last evening. Patient states she was trying to pick something from the kitchen floor when she suddenly lost balance and fell. She fell towards the back and hit her occipital area. Patient did not lose consciousness. CT of the head shows nondepressed fracture of the occipital skull with bilateral subarachnoid hemorrhage and subdural hemorrhage. On-call neurosurgeon Dr. Sherwood Gambler was consulted and patient has been admitted for further observation.    PT Comments    Progressing well,  Mentation and mobility continues to be functional despite some progression of bleed.   Follow Up Recommendations  Home health PT;Supervision for mobility/OOB     Equipment Recommendations  None recommended by PT    Recommendations for Other Services       Precautions / Restrictions Precautions Precautions: Fall    Mobility  Bed Mobility Overal bed mobility: Needs Assistance Bed Mobility: Supine to Sit     Supine to sit: Min guard (with rail)     General bed mobility comments: used rail today  Transfers Overall transfer level: Needs assistance Equipment used: None Transfers: Sit to/from Stand Sit to Stand: Min guard         General transfer comment: used bed frame for support  Ambulation/Gait Ambulation/Gait assistance: Supervision Ambulation Distance (Feet): 660 Feet Assistive device: Rolling walker (2 wheeled) (for increased distance) Gait Pattern/deviations: Step-through pattern   Gait velocity interpretation: at or above normal speed for age/gender General Gait Details: no overt unsteadiness with RW   Stairs            Wheelchair Mobility    Modified  Rankin (Stroke Patients Only)       Balance Overall balance assessment: Needs assistance Sitting-balance support: No upper extremity supported Sitting balance-Leahy Scale: Good       Standing balance-Leahy Scale: Fair Standing balance comment: can maintain steady stance, but prefers to hold to bed rail, etc.                    Cognition Arousal/Alertness: Awake/alert Behavior During Therapy: WFL for tasks assessed/performed Overall Cognitive Status: Within Functional Limits for tasks assessed                      Exercises General Exercises - Lower Extremity Hip ABduction/ADduction: AROM;Strengthening;Both;10 reps;Standing Hip Flexion/Marching: AROM;Strengthening;Both;10 reps;Standing Toe Raises: AROM;Both;10 reps;Standing Heel Raises: AROM;Strengthening;10 reps;Standing Mini-Sqauts: AROM;10 reps;Standing    General Comments General comments (skin integrity, edema, etc.): vitals remained stable      Pertinent Vitals/Pain Pain Assessment: Faces Faces Pain Scale: Hurts little more Pain Location: "tailbone" and back of head Pain Descriptors / Indicators: Sore;Aching Pain Intervention(s): Monitored during session    Home Living                      Prior Function            PT Goals (current goals can now be found in the care plan section) Acute Rehab PT Goals Patient Stated Goal: home independent PT Goal Formulation: With patient Time For Goal Achievement: 01/09/15 Potential to Achieve Goals: Good Progress towards PT goals: Progressing toward goals    Frequency  Min 3X/week    PT  Plan Current plan remains appropriate    Co-evaluation             End of Session   Activity Tolerance: Patient tolerated treatment well Patient left: in chair;with call bell/phone within reach;with family/visitor present     Time: 1093-2355 PT Time Calculation (min) (ACUTE ONLY): 41 min  Charges:  $Gait Training: 8-22 mins $Therapeutic  Exercise: 8-22 mins $Self Care/Home Management: 8-22                    G Codes:      Khaled Herda, Tessie Fass 01/03/2015, 11:24 AM 01/03/2015  Donnella Sham, PT [phone]  (pager)

## 2015-01-03 NOTE — Care Management Note (Signed)
Case Management Note  Patient Details  Name: Caroline Myers MRN: [id number] Date of Birth: 1923/12/11  Subjective/Objective:                   Pt from home s/p  fall with head injury.  Action/Plan: Return to home when medically stable. CM to f/u with d/cneeds.  Expected Discharge Date:  01/04/15               Expected Discharge Plan:  [place]  In-House Referral:     Discharge planning Services  CM Consult  Post Acute Care Choice:    Choice offered to:     DME Arranged:    DME Agency:     HH Arranged:    HH Agency:     Status of Service:  In process, will continue to follow  Medicare Important Message Given:    Date Medicare IM Given:    Medicare IM give by:    Date Additional Medicare IM Given:    Additional Medicare Important Message give by:     If discussed at [place] of Stay Meetings, dates discussed:    Additional Comments: Caroline Myers (Spouse)  [phone]  Sharin Mons, RN 01/03/2015, 10:22 AM

## 2015-01-03 NOTE — Progress Notes (Signed)
Subjective: Patient resting in bed, still having moderate headache, both occipital as well as bifrontal. CT of the brain without contrast shows evolution of bifrontal injury with bilateral subfrontal hemorrhagic cerebral contusions as well as a small left inferior frontal subdural hematoma; no significant mass effect is seen.  Objective: Vital signs in last 24 hours: Filed Vitals:   01/02/15 1922 01/02/15 2319 01/03/15 0405 01/03/15 0710  BP: 132/69 144/77 146/80   Pulse:  78 78   Temp: 98 F (36.7 C) 98.4 F (36.9 C) 98.7 F (37.1 C) 98 F (36.7 C)  TempSrc: Oral Oral Oral Oral  Resp: 25 24 20    Height:      Weight:      SpO2:  93% 93%     Intake/Output from previous day: 07/04 0701 - 07/05 0700 In: 483 [P.O.:480; I.V.:3] Out: -  Intake/Output this shift:    Physical Exam:  A bit lethargic, but oriented to name, [place], and July 2016. Following commands. Speech fluent. Pupils equal round and reactive to light. EOMI. Facial movements symmetrical. No drift of upper extremities. Moving all 4 extremities well.  CBC  Recent Labs  01/02/15 0124 01/02/15 0615  WBC 8.1 6.8  HGB 14.2 13.7  HCT 41.3 41.5  PLT 222 208   BMET  Recent Labs  01/02/15 0124 01/02/15 0615  NA 137 137  K 4.1 3.8  CL 105 100*  CO2 23 28  GLUCOSE 107* 98  BUN 19 15  CREATININE 0.75 0.77  CALCIUM 8.7* 8.5*    Studies/Results: Ct Head Wo Contrast  01/03/2015   CLINICAL DATA:  Follow-up subarachnoid hemorrhage after injury.  EXAM: CT HEAD WITHOUT CONTRAST  TECHNIQUE: Contiguous axial images were obtained from the base of the skull through the vertex without intravenous contrast.  COMPARISON:  CT head January 02, 2015  FINDINGS: Interval blossoming of RIGHT inferior frontal lobe with 10 x 20 mm intraparenchymal hematoma, LEFT frontal lobe round 8 mm hematoma. 3 mm subdural hematoma along the anterior falx. LEFT greater than RIGHT anterior cranial fossa extra-axial blood products. Stable cerebral  spinal fluid equivalent LEFT 5 mm extra-axial space. Old small LEFT cerebellar infarct. Ventricles and sulci are overall normal for patient's age. Patchy supratentorial white matter hypodensities compatible with chronic small vessel ischemic disease. No midline shift. Basal cisterns are patent. Moderate calcific atherosclerosis of the carotid siphons.  Patient is osteopenic. Nondisplaced RIGHT occipital skull fracture. Status post bilateral ocular lens implants.  IMPRESSION: Interval blossoming of RIGHT greater than LEFT inferior frontal lobe hemorrhagic contusions measuring up to 10 x 20 mm. Anterior cranial fossa subarachnoid blood, and suspected small LEFT subdural hematoma, in addition to 3 mm subdural hematoma along the anterior falx.  Nondisplaced occipital skull fracture.  Stable chronic changes, including moderate chronic small vessel ischemic disease, old small LEFT cerebellar infarct.   Electronically Signed   By: Elon Alas M.D.   On: 01/03/2015 06:34   Ct Head Wo Contrast  01/02/2015   CLINICAL DATA:  Fall with left facial pain. Headache. Initial encounter.  EXAM: CT HEAD WITHOUT CONTRAST  CT MAXILLOFACIAL WITHOUT CONTRAST  TECHNIQUE: Multidetector CT imaging of the head and maxillofacial structures were performed using the standard protocol without intravenous contrast. Multiplanar CT image reconstructions of the maxillofacial structures were also generated.  COMPARISON:  12/21/2013  FINDINGS: CT HEAD FINDINGS  Skull and Sinuses:Nondepressed fracture of the right occipital bone, extending from the foramen magnum towards the occipital protuberance.  Orbits: Bilateral cataract resection.  No  acute findings.  Brain: There is high-density extra-axial hematoma in the bilateral anterior cranial fossa, greater on the left. The hemorrhage is subarachnoid and likely also subdural. Small volume subarachnoid hemorrhage continues into left sylvian fissure. There is no associated mass effect.  Prominent  brain atrophy, generalized. Moderate chronic small vessel disease with ischemic gliosis noted in the deep cerebral white matter. There has been a remote left cerebellar small-vessel infarct.  CT MAXILLOFACIAL FINDINGS  No evidence of acute facial fracture. No mandibular dislocation. No evidence of globe injury or postseptal hematoma. Patient is status post bilateral cataract resection.  Advanced cervical degenerative disc disease, especially at C3-4 to C5-6 (below there is nonvisualization) with endplate ridging and uncovertebral spurring causing foraminal stenoses. Prominent C2-3 facet arthropathy with anterolisthesis and moderate canal stenosis.  Critical Value/emergent results were called by telephone at the time of interpretation on 01/02/2015 at 12:46 am to Dr. Pamella Pert , who verbally acknowledged these results.  IMPRESSION: 1. Bilateral inferior frontal subarachnoid and subdural hemorrhage. No significant mass effect or shift. 2. Non depressed right occipital bone fracture. 3. No facial fracture. 4. Brain atrophy and chronic small vessel disease.   Electronically Signed   By: Monte Fantasia M.D.   On: 01/02/2015 00:50   Ct Cervical Spine Wo Contrast  01/02/2015   CLINICAL DATA:  Fall.  EXAM: CT CERVICAL SPINE WITHOUT CONTRAST  TECHNIQUE: Multidetector CT imaging of the cervical spine was performed without intravenous contrast. Multiplanar CT image reconstructions were also generated.  COMPARISON:  12/21/2013  FINDINGS: Known nondisplaced right occipital bone fracture, discussed on dedicated head imaging.  No evidence of acute fracture or acute/traumatic malalignment. No gross cervical canal hematoma or prevertebral edema.  Degenerative changes are advanced and diffuse, with severe disc narrowing and endplate irregularity from C4-5 to C6-7. Posterior endplate ridging with posterior longitudinal ligament ossification at C5 causes advanced spinal canal stenosis with a minimal residual AP canal diameter  of 4 mm. From C3-4 to C6-7 uncovertebral spurs cause advanced foraminal stenosis on the left more than right. C2-3 anterolisthesis from advanced facet arthropathy, with bulging disc and dorsal ligamentous thickening causing moderate cord compression.  IMPRESSION: 1. No acute findings. 2. Advanced degenerative change with canal stenosis and cord compression at C2-3 and C5-6. 3. Nondepressed right occipital bone fracture.   Electronically Signed   By: Monte Fantasia M.D.   On: 01/02/2015 02:12   Ct Maxillofacial Wo Cm  01/02/2015   CLINICAL DATA:  Fall with left facial pain. Headache. Initial encounter.  EXAM: CT HEAD WITHOUT CONTRAST  CT MAXILLOFACIAL WITHOUT CONTRAST  TECHNIQUE: Multidetector CT imaging of the head and maxillofacial structures were performed using the standard protocol without intravenous contrast. Multiplanar CT image reconstructions of the maxillofacial structures were also generated.  COMPARISON:  12/21/2013  FINDINGS: CT HEAD FINDINGS  Skull and Sinuses:Nondepressed fracture of the right occipital bone, extending from the foramen magnum towards the occipital protuberance.  Orbits: Bilateral cataract resection.  No acute findings.  Brain: There is high-density extra-axial hematoma in the bilateral anterior cranial fossa, greater on the left. The hemorrhage is subarachnoid and likely also subdural. Small volume subarachnoid hemorrhage continues into left sylvian fissure. There is no associated mass effect.  Prominent brain atrophy, generalized. Moderate chronic small vessel disease with ischemic gliosis noted in the deep cerebral white matter. There has been a remote left cerebellar small-vessel infarct.  CT MAXILLOFACIAL FINDINGS  No evidence of acute facial fracture. No mandibular dislocation. No evidence of globe injury or postseptal  hematoma. Patient is status post bilateral cataract resection.  Advanced cervical degenerative disc disease, especially at C3-4 to C5-6 (below there is  nonvisualization) with endplate ridging and uncovertebral spurring causing foraminal stenoses. Prominent C2-3 facet arthropathy with anterolisthesis and moderate canal stenosis.  Critical Value/emergent results were called by telephone at the time of interpretation on 01/02/2015 at 12:46 am to Dr. Pamella Pert , who verbally acknowledged these results.  IMPRESSION: 1. Bilateral inferior frontal subarachnoid and subdural hemorrhage. No significant mass effect or shift. 2. Non depressed right occipital bone fracture. 3. No facial fracture. 4. Brain atrophy and chronic small vessel disease.   Electronically Signed   By: Monte Fantasia M.D.   On: 01/02/2015 00:50    Assessment/Plan: Some progression of bifrontal injury, however neurologically stable. Case discussed with Dr. Barton Dubois. I feel that we will need to repeat the CT in 2 days, however in the meantime she can be transferred to the neurosurgical MedSurg unit (4 N.). Encouraged to ambulate in the halls with the staff.   Hosie Spangle, MD 01/03/2015, 8:31 AM

## 2015-01-04 DIAGNOSIS — F329 Major depressive disorder, single episode, unspecified: Secondary | ICD-10-CM

## 2015-01-04 DIAGNOSIS — I609 Nontraumatic subarachnoid hemorrhage, unspecified: Secondary | ICD-10-CM

## 2015-01-04 LAB — CBC
HCT: 42.5 % (ref 36.0–46.0)
Hemoglobin: 14.3 g/dL (ref 12.0–15.0)
MCH: 32.5 pg (ref 26.0–34.0)
MCHC: 33.6 g/dL (ref 30.0–36.0)
MCV: 96.6 fL (ref 78.0–100.0)
Platelets: 229 10*3/uL (ref 150–400)
RBC: 4.4 MIL/uL (ref 3.87–5.11)
RDW: 12.9 % (ref 11.5–15.5)
WBC: 9.9 10*3/uL (ref 4.0–10.5)

## 2015-01-04 LAB — BASIC METABOLIC PANEL
ANION GAP: 8 (ref 5–15)
BUN: 10 mg/dL (ref 6–20)
CALCIUM: 8.6 mg/dL — AB (ref 8.9–10.3)
CO2: 26 mmol/L (ref 22–32)
Chloride: 100 mmol/L — ABNORMAL LOW (ref 101–111)
Creatinine, Ser: 0.72 mg/dL (ref 0.44–1.00)
GFR calc non Af Amer: >60 mL/min (ref >60)
Glucose, Bld: 102 mg/dL — ABNORMAL HIGH (ref 65–99)
Potassium: 3.8 mmol/L (ref 3.5–5.1)
Sodium: 134 mmol/L — ABNORMAL LOW (ref 135–145)

## 2015-01-04 NOTE — Progress Notes (Signed)
[facility] PROGRESS NOTE  Caroline Myers VPX:[id number] DOB: [birth year] DOA: 01/01/2015 PCP:  Melinda Crutch, MD  Assessment/Plan: Caroline Myers is a 81 y.o. female with history of CVA, chronic atrial fibrillation on Apixaban and asthma was brought to the ER after patient had fall at her house last evening. Patient states she was trying to pick something from the kitchen floor when she suddenly lost balance and fell. She fell towards the back and hit her occipital area. Patient did not lose consciousness. CT of the head shows nondepressed fracture of the occipital skull with bilateral subarachnoid hemorrhage and subdural hemorrhage. On-call neurosurgeon Dr. Sherwood Gambler was consulted and patient has been admitted for further observation. Patient is on Apixaban and was given [name] per pharmacy.   Repeated CT head showed progression of Subdural. Plan is to repeat CT head 7-7.   1-Fall: mechanical and w/o syncope or near syncope episode. -no prodromic symptoms  -home health.   2-Occipital skull fracture: with subdural hematomas and SAH -no focal neurologic deficit appreciated on exam -patient off anticoagulation -neurosurgery on board, will follow rec's -repeated  CT 7-5  with some progression in subdural hematomas and SAH size, but no mass effect. -Plan to repeat CT head 7-7, and decide further   3-Atrial fibrillation: rate controlled -holding apixaban currently due to [facility] and subdural hematomas  4-hx of asthma: no wheezing -will monitor -continue PRN albuterol  5-allergic rhinitis:  -will continue claritin and flonase -patient just finished abx's therapy for sinusitis prior to admisison  6-depression: will continue zoloft  7-physical deconditioning and off balance:  -normal B12 level -follow PT/OT rec's -normal TSH   Code Status: Full Family Communication: care discussed with patient.  Disposition Plan: repeat CT head 7-7.    Consultants:  Dr. Sherwood Gambler    Procedures:  See below for x-ray reports   Antibiotics:  None   HPI/Subjective: Denies headaches at this time.  Alert and oriented to place, situation. Was able to tell me her age, husband's name and that she has 2 daughter.   Objective: Filed Vitals:   01/04/15 0613  BP: 134/81  Pulse: 88  Temp: 98.1 F (36.7 C)  Resp: 18    Intake/Output Summary (Last 24 hours) at 01/04/15 0806 Last data filed at 01/03/15 1100  Gross per 24 hour  Intake      0 ml  Output      1 ml  Net     -1 ml   Filed Weights   01/02/15 0100 01/02/15 0415  Weight: 65.3 kg (143 lb 15.4 oz) 64.7 kg (142 lb 10.2 oz)    Exam:   General: AAOX3  Cardiovascular: rate controlled, no rubs or gallops  Respiratory: CTA bilaterally  Abdomen: soft, NT, ND, positive BS  Musculoskeletal: no edema or cyanosis  Neuro: no aphasia, no nystagmus, no focal motor deficit.     Data Reviewed: Basic Metabolic Panel:  Recent Labs Lab 01/02/15 0124 01/02/15 0615 01/04/15 0543  NA 137 137 134*  K 4.1 3.8 3.8  CL 105 100* 100*  CO2 23 28 26   GLUCOSE 107* 98 102*  BUN 19 15 10   CREATININE 0.75 0.77 0.72  CALCIUM 8.7* 8.5* 8.6*   Liver Function Tests:  Recent Labs Lab 01/02/15 0124 01/02/15 0615  AST 23 22  ALT 16 15  ALKPHOS 94 91  BILITOT 0.6 0.7  PROT 6.4* 6.3*  ALBUMIN 3.2* 3.0*   CBC:  Recent Labs Lab 01/02/15 0124 01/02/15 0615 01/04/15 0543  WBC 8.1 6.8 9.9  NEUTROABS 5.9 4.4  --   HGB 14.2 13.7 14.3  HCT 41.3 41.5 42.5  MCV 97.6 97.9 96.6  PLT 222 208 229   Cardiac Enzymes: No results for input(s): CKTOTAL, CKMB, CKMBINDEX, TROPONINI in the last 168 hours. BNP (last 3 results) No results for input(s): BNP in the last 8760 hours.  ProBNP (last 3 results) No results for input(s): PROBNP in the last 8760 hours.  CBG: No results for input(s): GLUCAP in the last 168 hours.  Recent Results (from the past 240 hour(s))  MRSA PCR Screening     Status: None    Collection Time: 01/02/15  4:20 AM  Result Value Ref Range Status   MRSA by PCR NEGATIVE NEGATIVE Final    Comment:        The GeneXpert MRSA Assay (FDA approved for NASAL specimens only), is one component of a comprehensive MRSA colonization surveillance program. It is not intended to diagnose MRSA infection nor to guide or monitor treatment for MRSA infections.      Studies: Ct Head Wo Contrast  01/03/2015   CLINICAL DATA:  Follow-up subarachnoid hemorrhage after injury.  EXAM: CT HEAD WITHOUT CONTRAST  TECHNIQUE: Contiguous axial images were obtained from the base of the skull through the vertex without intravenous contrast.  COMPARISON:  CT head January 02, 2015  FINDINGS: Interval blossoming of RIGHT inferior frontal lobe with 10 x 20 mm intraparenchymal hematoma, LEFT frontal lobe round 8 mm hematoma. 3 mm subdural hematoma along the anterior falx. LEFT greater than RIGHT anterior cranial fossa extra-axial blood products. Stable cerebral spinal fluid equivalent LEFT 5 mm extra-axial space. Old small LEFT cerebellar infarct. Ventricles and sulci are overall normal for patient's age. Patchy supratentorial white matter hypodensities compatible with chronic small vessel ischemic disease. No midline shift. Basal cisterns are patent. Moderate calcific atherosclerosis of the carotid siphons.  Patient is osteopenic. Nondisplaced RIGHT occipital skull fracture. Status post bilateral ocular lens implants.  IMPRESSION: Interval blossoming of RIGHT greater than LEFT inferior frontal lobe hemorrhagic contusions measuring up to 10 x 20 mm. Anterior cranial fossa subarachnoid blood, and suspected small LEFT subdural hematoma, in addition to 3 mm subdural hematoma along the anterior falx.  Nondisplaced occipital skull fracture.  Stable chronic changes, including moderate chronic small vessel ischemic disease, old small LEFT cerebellar infarct.   Electronically Signed   By: Elon Alas M.D.   On: 01/03/2015  06:34    Scheduled Meds: . fluticasone  2 spray Each Nare BID  . loratadine  10 mg Oral Daily  . sertraline  50 mg Oral Daily  . sodium chloride  3 mL Intravenous Q12H   Continuous Infusions:   Principal Problem:   SAH (subarachnoid hemorrhage) Active Problems:   Atrial fibrillation   SDH (subdural hematoma)   Occipital fracture   Benign essential HTN   Other allergic rhinitis   Depression    Time spent: 30 minutes    Jaking Thayer, [place] [facility] Pager [phone]. If 7PM-7AM, please contact night-coverage at [email or web address], password [facility] 01/04/2015, 8:06 AM  LOS: 2 days

## 2015-01-04 NOTE — Progress Notes (Signed)
Filed Vitals:   01/04/15 0613 01/04/15 0857 01/04/15 1434 01/04/15 1734  BP: 134/81 147/81 152/78 145/86  Pulse: 88 83 85 80  Temp: 98.1 F (36.7 C) 98.7 F (37.1 C) 98.5 F (36.9 C) 98.6 F (37 C)  TempSrc: Oral Oral Oral Oral  Resp: 18 20 20 20   Height:      Weight:      SpO2: 93% 95% 98% 98%    CBC  Recent Labs  01/02/15 0615 01/04/15 0543  WBC 6.8 9.9  HGB 13.7 14.3  HCT 41.5 42.5  PLT 208 229   BMET  Recent Labs  01/02/15 0615 01/04/15 0543  NA 137 134*  K 3.8 3.8  CL 100* 100*  CO2 28 26  GLUCOSE 98 102*  BUN 15 10  CREATININE 0.77 0.72  CALCIUM 8.5* 8.6*    Patient sitting up having dinner. Mild headache.  Awake and alert, oriented to name, [facility], and July 2016. Following commands. Speech fluent. Pupils equal round and reactive to light. EOMI. Facial movements symmetrical. Moving all 4 extremities well. No drift of upper extremities.  Plan: Patient neurologically stable. Scheduled for follow-up CT the brain without contrast in a.m. Has not been ambulating in the halls, order place to ambulate with assistance in halls.  Hosie Spangle, MD 01/04/2015, 6:17 PM

## 2015-01-04 NOTE — Progress Notes (Signed)
Physical Therapy Treatment Patient Details Name: Caroline Myers MRN: [id number] DOB: 1924/03/30 Today's Date: 01/04/2015    History of Present Illness Caroline Myers is a 81 y.o. female with history of CVA, chronic atrial fibrillation on Apixaban and asthma was brought to the ER after patient had fall at her house last evening. Patient states she was trying to pick something from the kitchen floor when she suddenly lost balance and fell. She fell towards the back and hit her occipital area. Patient did not lose consciousness. CT of the head shows nondepressed fracture of the occipital skull with bilateral subarachnoid hemorrhage and subdural hemorrhage. On-call neurosurgeon Dr. Sherwood Gambler was consulted and patient has been admitted for further observation.    PT Comments    Pt sleeping with lunch tray just delivered.  Assisted OOb to amb to bathroom, very unsteady gait with MAX c/o "tailbone" pain.  Assisted in bathroom due to increased unsteadiness.  Pain "going down into L hip".  Assisted with amb only to recliner.  Positioned to comfort.  Pt declined pain meds.   Follow Up Recommendations  Home health PT;Supervision/Assistance - 24 hour     Equipment Recommendations  None recommended by PT    Recommendations for Other Services       Precautions / Restrictions Precautions Precautions: Fall Restrictions Weight Bearing Restrictions: No    Mobility  Bed Mobility Overal bed mobility: Needs Assistance Bed Mobility: Supine to Sit     Supine to sit: Min guard     General bed mobility comments: increased time waking up/groggy  Transfers Overall transfer level: Needs assistance Equipment used: None Transfers: Sit to/from Stand Sit to Stand: Min guard;Min assist         General transfer comment: Increased assist needed due to pain level "tailbone" and L hip.   Ambulation/Gait Ambulation/Gait assistance: Min assist;Mod assist Ambulation Distance (Feet): 10 Feet Assistive  device: 1 person hand held assist Gait Pattern/deviations: Step-to pattern;Step-through pattern Gait velocity: slower   General Gait Details: limited amb distance due to pain.  Only tolerated amb to and from bathroom.     Stairs            Wheelchair Mobility    Modified Rankin (Stroke Patients Only)       Balance Overall balance assessment: Needs assistance Sitting-balance support: Feet supported;No upper extremity supported       Standing balance support: Single extremity supported   Standing balance comment: Pt holding onto OT arm upon standing; reports LE weakness and pain                    Cognition Arousal/Alertness: Awake/alert Behavior During Therapy: WFL for tasks assessed/performed Overall Cognitive Status: Within Functional Limits for tasks assessed                      Exercises      General Comments        Pertinent Vitals/Pain Pain Assessment: 0-10 Pain Score: 8  Pain Location: "tailbone" and headache Pain Descriptors / Indicators: Aching;Constant;Grimacing;Sore Pain Intervention(s): Monitored during session;Repositioned    Home Living                      Prior Function            PT Goals (current goals can now be found in the care plan section) Acute Rehab PT Goals Patient Stated Goal: to go home  Progress towards PT goals: Progressing toward goals  Frequency  Min 3X/week    PT Plan Current plan remains appropriate    Co-evaluation             End of Session Equipment Utilized During Treatment: Gait belt Activity Tolerance: No increased pain Patient left: in chair;with chair alarm set;with call bell/phone within reach     Time: 1138-1150 PT Time Calculation (min) (ACUTE ONLY): 12 min  Charges:  $Gait Training: 8-22 mins                    G Codes:      Rica Koyanagi  PTA [facility]  Acute  Rehab Pager      [phone]

## 2015-01-04 NOTE — Progress Notes (Addendum)
Pt was ambulated by staff. She completed 100 ft at 230 pm with a rolling walker

## 2015-01-04 NOTE — Progress Notes (Signed)
Occupational Therapy Treatment Patient Details Name: Caroline Myers MRN: [id number] DOB: 1923/08/28 Today's Date: 01/04/2015    History of present illness Caroline Myers is a 81 y.o. female with history of CVA, chronic atrial fibrillation on Apixaban and asthma was brought to the ER after patient had fall at her house last evening. Patient states she was trying to pick something from the kitchen floor when she suddenly lost balance and fell. She fell towards the back and hit her occipital area. Patient did not lose consciousness. CT of the head shows nondepressed fracture of the occipital skull with bilateral subarachnoid hemorrhage and subdural hemorrhage. On-call neurosurgeon Dr. Sherwood Gambler was consulted and patient has been admitted for further observation.   OT comments  Pt ambulating from bathroom with nurse tech upon OT arrival. Pt complains of LE weakness and pain in "tailbone" affecting her ability to walk. Pt reports seeing spider webs and moving pictures yesterday (7/5), denies seeing strange objects or spider webs today. Pt completed MoCA assessment and scored 26/30 (26 and above is considered normal). Pt had difficulty with short term memory component of assessment; education provided that OT will continue to assess memory as it relates to functional activities at home.  Pt would benefit from continued OT for safety and increased independence with ADLs prior to d/c home with recommended OP OT.    Follow Up Recommendations  Outpatient OT;Supervision/Assistance - 24 hour    Equipment Recommendations  None recommended by OT    Recommendations for Other Services      Precautions / Restrictions Precautions Precautions: Fall Restrictions Weight Bearing Restrictions: No       Mobility Bed Mobility               General bed mobility comments: Pt ambulating from bathroom with nurse tech upon arrival  Transfers Overall transfer level: Needs assistance Equipment used:  None Transfers: Sit to/from Stand Sit to Stand: Min guard         General transfer comment: Pt complaining of pain/ discomfort in LE and "tailbone"    Balance Overall balance assessment: Needs assistance Sitting-balance support: Feet supported;No upper extremity supported       Standing balance support: Single extremity supported   Standing balance comment: Pt holding onto OT arm upon standing; reports LE weakness and pain                   ADL Overall ADL's : Needs assistance/impaired Eating/Feeding: Independent;Sitting                                     General ADL Comments: Pt reports LE weakness and pain limiting her ability to complete ADLs standing      Vision                 Additional Comments: Pt denies vision deficits today; reports seeing spider webs and moving pictures yesterday (7/5). Pt wearing reading glasses for MoCA assessment.   Perception     Praxis      Cognition   Behavior During Therapy: WFL for tasks assessed/performed Overall Cognitive Status: Within Functional Limits for tasks assessed                       Extremity/Trunk Assessment               Exercises     Shoulder Instructions  General Comments      Pertinent Vitals/ Pain       Pain Assessment: 0-10 Pain Score: 8  Pain Location: "tailbone" and headache Pain Descriptors / Indicators: Aching;Constant;Grimacing;Sore Pain Intervention(s): Monitored during session;Repositioned  Home Living                                          Prior Functioning/Environment              Frequency Min 2X/week     Progress Toward Goals  OT Goals(current goals can now be found in the care plan section)  Progress towards OT goals: Progressing toward goals  Acute Rehab OT Goals Patient Stated Goal: to go home  OT Goal Formulation: With patient Time For Goal Achievement: 01/17/15 Potential to Achieve Goals:  Good ADL Goals Pt Will Perform Grooming: with modified independence;standing Pt Will Perform Upper Body Bathing: with modified independence;standing Pt Will Perform Lower Body Bathing: with modified independence;sit to/from stand Pt Will Perform Upper Body Dressing: with modified independence;sitting Pt Will Perform Lower Body Dressing: with modified independence;sit to/from stand Pt Will Transfer to Toilet: with modified independence;ambulating;regular height toilet;grab bars Pt Will Perform Toileting - Clothing Manipulation and hygiene: with modified independence;sit to/from stand Additional ADL Goal #1: Pt will perform path finding with supervision Additional ADL Goal #2: Pt will participate in further cognitive assessment.   Plan Discharge plan remains appropriate    Co-evaluation                 End of Session Equipment Utilized During Treatment: Gait belt   Activity Tolerance Patient tolerated treatment well   Patient Left in chair;with call bell/phone within reach;with chair alarm set   Nurse Communication Mobility status;Patient requests pain meds        Time: 8110-3159 OT Time Calculation (min): 35 min  Charges:    Forest Gleason 01/04/2015, 9:44 AM

## 2015-01-04 NOTE — Progress Notes (Addendum)
Pt was ambulated by staff. She completed 200 ft with a  Rolling walker at [place].

## 2015-01-04 NOTE — Care Management Note (Signed)
Case Management Note  Patient Details  Name: Caroline Myers MRN: [id number] Date of Birth: 1923-11-11  Subjective/Objective:                    Action/Plan:  Met with patient, husband and daughter to discuss discharge needs. Patient is agreeable to home health PT and husband has chosen [facility], which he has used in the past.  [name] with [facility] was notified and has accepted the referral.  Patient's preferred contact number is [phone].  Daughter Caroline Myers [phone] is patient's secondary contact number. Expected Discharge Date:  01/04/15               Expected Discharge Plan:  [place]  In-House Referral:     Discharge planning Services  CM Consult  Post Acute Care Choice:    Choice offered to:     DME Arranged:    DME Agency:     HH Arranged:    [place] Agency:  [place]  Status of Service:  Completed, signed off  Medicare Important Message Given:    Date Medicare IM Given:    Medicare IM give by:    Date Additional Medicare IM Given:    Additional Medicare Important Message give by:     If discussed at [place] of Stay Meetings, dates discussed:    Additional Comments:  Rolm Baptise, RN 01/04/2015, 1:52 PM

## 2015-01-05 ENCOUNTER — Inpatient Hospital Stay (HOSPITAL_COMMUNITY): Payer: Medicare Other

## 2015-01-05 DIAGNOSIS — I480 Paroxysmal atrial fibrillation: Secondary | ICD-10-CM

## 2015-01-05 MED ORDER — ACETAMINOPHEN 325 MG PO TABS: 650.0000 mg | ORAL_TABLET | Freq: Four times a day (QID) | ORAL | Status: DC | PRN

## 2015-01-05 NOTE — Progress Notes (Signed)
Patient discharged home with family, IV removed, perscription given, waiting on shower seat, not complaining of pain and vitals are within her normal limits.

## 2015-01-05 NOTE — Progress Notes (Signed)
Physical Therapy Treatment Patient Details Name: Caroline Myers MRN: [id number] DOB: 1923/12/28 Today's Date: 01/05/2015    History of Present Illness Caroline Myers is a 81 y.o. female with history of CVA, chronic atrial fibrillation on Apixaban and asthma was brought to the ER after patient had fall at her house last evening. Patient states she was trying to pick something from the kitchen floor when she suddenly lost balance and fell. She fell towards the back and hit her occipital area. Patient did not lose consciousness. CT of the head shows nondepressed fracture of the occipital skull with bilateral subarachnoid hemorrhage and subdural hemorrhage. On-call neurosurgeon Dr. Sherwood Gambler was consulted and patient has been admitted for further observation.    PT Comments    Patient ready and eager to walk. Still having some increased tailbone pain especially with increased ambulation. Discussed repositioning techniques with patient and her daughter. Also discussed use of "donut" pillow if need be. Patient is planning to DC home with assistance from her daughter.   Follow Up Recommendations  Home health PT;Supervision/Assistance - 24 hour     Equipment Recommendations  None recommended by PT    Recommendations for Other Services       Precautions / Restrictions Precautions Precautions: Fall    Mobility  Bed Mobility               General bed mobility comments: Patient in recliner before and after session  Transfers Overall transfer level: Needs assistance Equipment used: None   Sit to Stand: Min guard         General transfer comment: Cues for safe technique  Ambulation/Gait Ambulation/Gait assistance: Min guard Ambulation Distance (Feet): 60 Feet (x2) Assistive device: Rolling walker (2 wheeled) Gait Pattern/deviations: Step-through pattern;Decreased stride length   Gait velocity interpretation: Below normal speed for age/gender General Gait Details: patient  having tailbone pain causing her to "pause and catch" with gait.    Stairs            Wheelchair Mobility    Modified Rankin (Stroke Patients Only)       Balance                                    Cognition Arousal/Alertness: Awake/alert Behavior During Therapy: WFL for tasks assessed/performed Overall Cognitive Status: Within Functional Limits for tasks assessed                      Exercises      General Comments        Pertinent Vitals/Pain Pain Score: 8  Pain Location: tailbone pain Pain Descriptors / Indicators: Aching;Sore Pain Intervention(s): Monitored during session    Home Living                      Prior Function            PT Goals (current goals can now be found in the care plan section) Progress towards PT goals: Progressing toward goals    Frequency  Min 3X/week    PT Plan Current plan remains appropriate    Co-evaluation             End of Session   Activity Tolerance: Patient tolerated treatment well;Patient limited by pain Patient left: in chair;with call bell/phone within reach     Time: 1101-1130 PT Time Calculation (min) (ACUTE ONLY): 29 min  Charges:  $Gait Training: 8-22 mins $Therapeutic Activity: 8-22 mins                    G Codes:      Jacqualyn Posey 01/05/2015, 12:50 PM  01/05/2015 Jacqualyn Posey PTA [phone] pager [phone] office

## 2015-01-05 NOTE — Progress Notes (Signed)
Met with patient and family to further discuss discharge planning.  Patient's daughter states that rehab had been suggested by staff at discharge and she was concerned because this was not in accordance with the plan discussed on 01/04/15.  CM explained that staff was likely concerned that patient's husband may not be able to provide enough physical assistance due to patient's confusion and fall risk.  CM verified that daughter DOES NOT work and is available to provide assistance in the home.  Daughter and husband feel that patient's confusion will improve once she is back in her home environment.  CM explained that in order to qualify for rehab, patient would require a PT evaluation stating the need based on patient's physical abilities.  Family is aware that cognitive deficits will not qualify patient for rehab.  Family feels that they are able to care for the patient at home.

## 2015-01-05 NOTE — Discharge Summary (Signed)
Physician Discharge Summary  Caroline Myers TKW:[id number] DOB: October 13, 1923 DOA: 01/01/2015  PCP:  Melinda Crutch, MD  Admit date: 01/01/2015 Discharge date: 01/05/2015  Time spent: 35 minutes  Recommendations for Outpatient Follow-up:  1. Follow up with Dr Rita Ohara in 2 weeks for repeat CT head.  2. Follow up with Primary cardiologist in one month to discussed further anticoagulation.   Discharge Diagnoses:  Principal Problem:   SAH (subarachnoid hemorrhage)   Subdural hematoma   Atrial fibrillation   SDH (subdural hematoma)   Occipital fracture   Benign essential HTN   Other allergic rhinitis   Depression   Discharge Condition: stable  Diet recommendation: heart healthy  Filed Weights   01/02/15 0100 01/02/15 0415  Weight: 65.3 kg (143 lb 15.4 oz) 64.7 kg (142 lb 10.2 oz)    History of present illness:  Caroline Myers is a 81 y.o. female with history of CVA, chronic atrial fibrillation on Apixaban and asthma was brought to the ER after patient had fall at her house last evening. Patient states she was trying to pick something from the kitchen floor when she suddenly lost balance and fell. She fell towards the back and hit her occipital area. Patient did not lose consciousness. CT of the head shows nondepressed fracture of the occipital skull with bilateral subarachnoid hemorrhage and subdural hemorrhage. On-call neurosurgeon Dr. Sherwood Gambler was consulted and patient has been admitted for further observation. Patient is on Apixaban and was given [name] per pharmacy. Patient on exam appears nonfocal. Patient has had some headache out of 4 which improved with Tylenol. Patient denies any palpitations chest pain or shortness of breath.   Hospital Course:  Caroline Myers is a 81 y.o. female with history of CVA, chronic atrial fibrillation on Apixaban and asthma was brought to the ER after patient had fall at her house last evening. Patient states she was trying to pick something from the  kitchen floor when she suddenly lost balance and fell. She fell towards the back and hit her occipital area. Patient did not lose consciousness. CT of the head shows nondepressed fracture of the occipital skull with bilateral subarachnoid hemorrhage and subdural hemorrhage. On-call neurosurgeon Dr. Sherwood Gambler was consulted and patient has been admitted for further observation. Patient is on Apixaban and was given [name] per pharmacy.   Repeated CT head showed progression of Subdural. Plan is to repeat CT head 7-7.   1-Fall: mechanical and w/o syncope or near syncope episode. -no prodromic symptoms  -home health.  -relates hip pain, X ray negative for fracture.   2-Occipital skull fracture: with subdural hematomas and SAH -no focal neurologic deficit appreciated on exam -patient off anticoagulation -neurosurgery on board, will follow rec's -repeated CT 7-5 with some progression in subdural hematomas and SAH size, but no mass effect. -CT head stable. Plan to discharge today and follow up in 2 weeks with Dr Sherwood Gambler.   3-Atrial fibrillation: rate controlled -holding apixaban currently due to [facility] and subdural hematomas  4-hx of asthma: no wheezing -will monitor -continue PRN albuterol  5-allergic rhinitis:  -will continue claritin and flonase -patient just finished abx's therapy for sinusitis prior to admisison  6-depression: will continue zoloft  7-physical deconditioning and off balance:  -normal B12 level -PT/OT rec's; HH , PT, OT -normal TSH  Procedures:  none  Consultations:  Dr Sherwood Gambler.   Discharge Exam: Filed Vitals:   01/05/15 1331  BP: 120/70  Pulse: 90  Temp: 97.7 F (36.5 C)  Resp:  18    General: Alert in no distress.  Cardiovascular: S 1, S 2 RRR Respiratory: CTA  Discharge Instructions   Discharge Instructions    Diet - low sodium heart healthy    Complete by:  As directed      Increase activity slowly    Complete by:  As directed            Current Discharge Medication List    START taking these medications   Details  acetaminophen (TYLENOL) 325 MG tablet Take 2 tablets (650 mg total) by mouth every 6 (six) hours as needed for mild pain (or Fever >/= 101). Qty: 30 tablet, Refills: 0      CONTINUE these medications which have NOT CHANGED   Details  albuterol (PROVENTIL HFA;VENTOLIN HFA) 108 (90 BASE) MCG/ACT inhaler Inhale 1-2 puffs into the lungs every 6 (six) hours as needed for wheezing or shortness of breath.    fluticasone (FLONASE) 50 MCG/ACT nasal spray Place 2 sprays into both nostrils 2 (two) times daily. Refills: 0    sertraline (ZOLOFT) 50 MG tablet Take 50 mg by mouth daily. Refills: 4      STOP taking these medications     amoxicillin-clavulanate (AUGMENTIN) 875-125 MG per tablet      apixaban (ELIQUIS) 5 MG TABS tablet        Allergies  Allergen Reactions  . Tape Other (See Comments)    Rips skin   Follow-up Information    Follow up with Hosie Spangle, MD. Call in 2 weeks.   Specialty:  Neurosurgery   Contact information:   [address] [place] [address] [phone]        The results of significant diagnostics from this hospitalization (including imaging, microbiology, ancillary and laboratory) are listed below for reference.    Significant Diagnostic Studies: Ct Head Wo Contrast  01/05/2015   CLINICAL DATA:  81 year old female with intracranial hemorrhage post fall. Subsequent encounter.  EXAM: CT HEAD WITHOUT CONTRAST  TECHNIQUE: Contiguous axial images were obtained from the base of the skull through the vertex without intravenous contrast.  COMPARISON:  01/03/2015 and 01/02/2015 CT.  FINDINGS: Bilateral anterior inferior frontal lobe hemorrhagic contusions (larger on the right) minimally changed since the 01/03/2015 exam. Right frontal hemorrhagic contusion with maximal transverse dimension of 2 x 1.2 cm versus prior 2 x 1 cm. Left frontal lobe hemorrhagic  contusion with maximal transverse dimension of 1 x 0.9 cm versus prior 0.8 x 0.8 cm.  Minimal increase in amount of surrounding vasogenic edema.  Anterior left frontal lobe subdural hematoma without significant change measuring 3.2 x 1.1 cm.  Very small anterior inferior falx subdural hematoma and possibly tiny amount of adjacent subarachnoid blood unchanged.  Global atrophy without hydrocephalus.  Chronic prominent extra-axial space left frontal region unchanged possibly a small subdural hygroma.  Small vessel disease type changes without CT evidence of large acute infarct.  No intracranial mass lesion noted on this unenhanced exam.  Nondisplaced right occipital fracture once again noted.  IMPRESSION: Bilateral anterior inferior frontal lobe hemorrhagic contusions (larger on the right) minimally changed since the 01/03/2015 exam. Right frontal hemorrhagic contusion with maximal transverse dimension of 2 x 1.2 cm versus prior 2 x 1 cm. Left frontal lobe hemorrhagic contusion with maximal transverse dimension of 1 x 0.9 cm versus prior 0.8 x 0.8 cm.  Minimal increase in amount of surrounding vasogenic edema.  Anterior left frontal lobe subdural hematoma without significant change measuring 3.2 x 1.1  cm.  Very small anterior inferior falx subdural hematoma and possibly tiny amount of adjacent subarachnoid blood unchanged.  Nondisplaced right occipital fracture once again noted.   Electronically Signed   By: Genia Del M.D.   On: 01/05/2015 07:02   Ct Head Wo Contrast  01/03/2015   CLINICAL DATA:  Follow-up subarachnoid hemorrhage after injury.  EXAM: CT HEAD WITHOUT CONTRAST  TECHNIQUE: Contiguous axial images were obtained from the base of the skull through the vertex without intravenous contrast.  COMPARISON:  CT head January 02, 2015  FINDINGS: Interval blossoming of RIGHT inferior frontal lobe with 10 x 20 mm intraparenchymal hematoma, LEFT frontal lobe round 8 mm hematoma. 3 mm subdural hematoma along the  anterior falx. LEFT greater than RIGHT anterior cranial fossa extra-axial blood products. Stable cerebral spinal fluid equivalent LEFT 5 mm extra-axial space. Old small LEFT cerebellar infarct. Ventricles and sulci are overall normal for patient's age. Patchy supratentorial white matter hypodensities compatible with chronic small vessel ischemic disease. No midline shift. Basal cisterns are patent. Moderate calcific atherosclerosis of the carotid siphons.  Patient is osteopenic. Nondisplaced RIGHT occipital skull fracture. Status post bilateral ocular lens implants.  IMPRESSION: Interval blossoming of RIGHT greater than LEFT inferior frontal lobe hemorrhagic contusions measuring up to 10 x 20 mm. Anterior cranial fossa subarachnoid blood, and suspected small LEFT subdural hematoma, in addition to 3 mm subdural hematoma along the anterior falx.  Nondisplaced occipital skull fracture.  Stable chronic changes, including moderate chronic small vessel ischemic disease, old small LEFT cerebellar infarct.   Electronically Signed   By: Elon Alas M.D.   On: 01/03/2015 06:34   Ct Head Wo Contrast  01/02/2015   CLINICAL DATA:  Fall with left facial pain. Headache. Initial encounter.  EXAM: CT HEAD WITHOUT CONTRAST  CT MAXILLOFACIAL WITHOUT CONTRAST  TECHNIQUE: Multidetector CT imaging of the head and maxillofacial structures were performed using the standard protocol without intravenous contrast. Multiplanar CT image reconstructions of the maxillofacial structures were also generated.  COMPARISON:  12/21/2013  FINDINGS: CT HEAD FINDINGS  Skull and Sinuses:Nondepressed fracture of the right occipital bone, extending from the foramen magnum towards the occipital protuberance.  Orbits: Bilateral cataract resection.  No acute findings.  Brain: There is high-density extra-axial hematoma in the bilateral anterior cranial fossa, greater on the left. The hemorrhage is subarachnoid and likely also subdural. Small volume  subarachnoid hemorrhage continues into left sylvian fissure. There is no associated mass effect.  Prominent brain atrophy, generalized. Moderate chronic small vessel disease with ischemic gliosis noted in the deep cerebral white matter. There has been a remote left cerebellar small-vessel infarct.  CT MAXILLOFACIAL FINDINGS  No evidence of acute facial fracture. No mandibular dislocation. No evidence of globe injury or postseptal hematoma. Patient is status post bilateral cataract resection.  Advanced cervical degenerative disc disease, especially at C3-4 to C5-6 (below there is nonvisualization) with endplate ridging and uncovertebral spurring causing foraminal stenoses. Prominent C2-3 facet arthropathy with anterolisthesis and moderate canal stenosis.  Critical Value/emergent results were called by telephone at the time of interpretation on 01/02/2015 at 12:46 am to Dr. Pamella Pert , who verbally acknowledged these results.  IMPRESSION: 1. Bilateral inferior frontal subarachnoid and subdural hemorrhage. No significant mass effect or shift. 2. Non depressed right occipital bone fracture. 3. No facial fracture. 4. Brain atrophy and chronic small vessel disease.   Electronically Signed   By: Monte Fantasia M.D.   On: 01/02/2015 00:50   Ct Cervical Spine Wo Contrast  01/02/2015   CLINICAL DATA:  Fall.  EXAM: CT CERVICAL SPINE WITHOUT CONTRAST  TECHNIQUE: Multidetector CT imaging of the cervical spine was performed without intravenous contrast. Multiplanar CT image reconstructions were also generated.  COMPARISON:  12/21/2013  FINDINGS: Known nondisplaced right occipital bone fracture, discussed on dedicated head imaging.  No evidence of acute fracture or acute/traumatic malalignment. No gross cervical canal hematoma or prevertebral edema.  Degenerative changes are advanced and diffuse, with severe disc narrowing and endplate irregularity from C4-5 to C6-7. Posterior endplate ridging with posterior longitudinal  ligament ossification at C5 causes advanced spinal canal stenosis with a minimal residual AP canal diameter of 4 mm. From C3-4 to C6-7 uncovertebral spurs cause advanced foraminal stenosis on the left more than right. C2-3 anterolisthesis from advanced facet arthropathy, with bulging disc and dorsal ligamentous thickening causing moderate cord compression.  IMPRESSION: 1. No acute findings. 2. Advanced degenerative change with canal stenosis and cord compression at C2-3 and C5-6. 3. Nondepressed right occipital bone fracture.   Electronically Signed   By: Monte Fantasia M.D.   On: 01/02/2015 02:12   Dg Hips Bilat With Pelvis Min 5 Views  01/05/2015   CLINICAL DATA:  Fall.  Hip and pelvic pain.  Initial encounter.  EXAM: BILATERAL HIP (WITH PELVIS) 5-6 VIEWS  COMPARISON:  None.  FINDINGS: There is no evidence of hip fracture or dislocation. No pelvic fracture identified.  Mild bilateral hip osteoarthritis noted. Degenerative changes also seen involving the pubic symphysis and lower lumbar spine. Generalized osteopenia noted as well as peripheral vascular calcification.  IMPRESSION: No acute findings.  Mild bilateral hip osteoarthritis.  Pubic symphysis and lower lumbar spine degenerative changes.   Electronically Signed   By: Earle Gell M.D.   On: 01/05/2015 13:39   Ct Maxillofacial Wo Cm  01/02/2015   CLINICAL DATA:  Fall with left facial pain. Headache. Initial encounter.  EXAM: CT HEAD WITHOUT CONTRAST  CT MAXILLOFACIAL WITHOUT CONTRAST  TECHNIQUE: Multidetector CT imaging of the head and maxillofacial structures were performed using the standard protocol without intravenous contrast. Multiplanar CT image reconstructions of the maxillofacial structures were also generated.  COMPARISON:  12/21/2013  FINDINGS: CT HEAD FINDINGS  Skull and Sinuses:Nondepressed fracture of the right occipital bone, extending from the foramen magnum towards the occipital protuberance.  Orbits: Bilateral cataract resection.  No  acute findings.  Brain: There is high-density extra-axial hematoma in the bilateral anterior cranial fossa, greater on the left. The hemorrhage is subarachnoid and likely also subdural. Small volume subarachnoid hemorrhage continues into left sylvian fissure. There is no associated mass effect.  Prominent brain atrophy, generalized. Moderate chronic small vessel disease with ischemic gliosis noted in the deep cerebral white matter. There has been a remote left cerebellar small-vessel infarct.  CT MAXILLOFACIAL FINDINGS  No evidence of acute facial fracture. No mandibular dislocation. No evidence of globe injury or postseptal hematoma. Patient is status post bilateral cataract resection.  Advanced cervical degenerative disc disease, especially at C3-4 to C5-6 (below there is nonvisualization) with endplate ridging and uncovertebral spurring causing foraminal stenoses. Prominent C2-3 facet arthropathy with anterolisthesis and moderate canal stenosis.  Critical Value/emergent results were called by telephone at the time of interpretation on 01/02/2015 at 12:46 am to Dr. Pamella Pert , who verbally acknowledged these results.  IMPRESSION: 1. Bilateral inferior frontal subarachnoid and subdural hemorrhage. No significant mass effect or shift. 2. Non depressed right occipital bone fracture. 3. No facial fracture. 4. Brain atrophy and chronic small vessel disease.  Electronically Signed   By: Monte Fantasia M.D.   On: 01/02/2015 00:50    Microbiology: Recent Results (from the past 240 hour(s))  MRSA PCR Screening     Status: None   Collection Time: 01/02/15  4:20 AM  Result Value Ref Range Status   MRSA by PCR NEGATIVE NEGATIVE Final    Comment:        The GeneXpert MRSA Assay (FDA approved for NASAL specimens only), is one component of a comprehensive MRSA colonization surveillance program. It is not intended to diagnose MRSA infection nor to guide or monitor treatment for MRSA infections.       Labs: Basic Metabolic Panel:  Recent Labs Lab 01/02/15 0124 01/02/15 0615 01/04/15 0543  NA 137 137 134*  K 4.1 3.8 3.8  CL 105 100* 100*  CO2 23 28 26   GLUCOSE 107* 98 102*  BUN 19 15 10   CREATININE 0.75 0.77 0.72  CALCIUM 8.7* 8.5* 8.6*   Liver Function Tests:  Recent Labs Lab 01/02/15 0124 01/02/15 0615  AST 23 22  ALT 16 15  ALKPHOS 94 91  BILITOT 0.6 0.7  PROT 6.4* 6.3*  ALBUMIN 3.2* 3.0*   No results for input(s): LIPASE, AMYLASE in the last 168 hours. No results for input(s): AMMONIA in the last 168 hours. CBC:  Recent Labs Lab 01/02/15 0124 01/02/15 0615 01/04/15 0543  WBC 8.1 6.8 9.9  NEUTROABS 5.9 4.4  --   HGB 14.2 13.7 14.3  HCT 41.3 41.5 42.5  MCV 97.6 97.9 96.6  PLT 222 208 229   Cardiac Enzymes: No results for input(s): CKTOTAL, CKMB, CKMBINDEX, TROPONINI in the last 168 hours. BNP: BNP (last 3 results) No results for input(s): BNP in the last 8760 hours.  ProBNP (last 3 results) No results for input(s): PROBNP in the last 8760 hours.  CBG: No results for input(s): GLUCAP in the last 168 hours.     Signed:  Niel Hummer A  [facility] 01/05/2015, 2:48 PM

## 2015-01-05 NOTE — Care Management (Signed)
Important Message  Patient Details  Name: LINCY BELLES MRN: [id number] Date of Birth: [birth year]   Medicare Important Message Given:  Yes-second notification given    Delorse Lek 01/05/2015, 2:20 PM

## 2015-01-05 NOTE — Progress Notes (Signed)
Subjective: Patient resting in bed, some occipital headache. CT of the brain without contrast this morning shows continued evolution of bifrontal hemorrhagic contusions, but no interval change of small left inferior frontal subdural hematoma.  Objective: Vital signs in last 24 hours: Filed Vitals:   01/05/15 0000 01/05/15 0547 01/05/15 0943 01/05/15 1331  BP:  126/67 128/70 120/70  Pulse:  82 72 90  Temp:  97.7 F (36.5 C) 97.6 F (36.4 C) 97.7 F (36.5 C)  TempSrc:  Oral Oral Oral  Resp: 19  18 18   Height:      Weight:      SpO2:  96% 95% 95%    Intake/Output from previous day:   Intake/Output this shift: Total I/O In: 3 [I.V.:3] Out: -   Physical Exam:  Awake and alert, oriented to name, [place], and July 2016. Following commands. Moving all 4 extremity is well. No drift of upper extremities.  CBC  Recent Labs  01/04/15 0543  WBC 9.9  HGB 14.3  HCT 42.5  PLT 229   BMET  Recent Labs  01/04/15 0543  NA 134*  K 3.8  CL 100*  CO2 26  GLUCOSE 102*  BUN 10  CREATININE 0.72  CALCIUM 8.6*   Studies/Results: Ct Head Wo Contrast  01/05/2015   CLINICAL DATA:  81 year old female with intracranial hemorrhage post fall. Subsequent encounter.  EXAM: CT HEAD WITHOUT CONTRAST  TECHNIQUE: Contiguous axial images were obtained from the base of the skull through the vertex without intravenous contrast.  COMPARISON:  01/03/2015 and 01/02/2015 CT.  FINDINGS: Bilateral anterior inferior frontal lobe hemorrhagic contusions (larger on the right) minimally changed since the 01/03/2015 exam. Right frontal hemorrhagic contusion with maximal transverse dimension of 2 x 1.2 cm versus prior 2 x 1 cm. Left frontal lobe hemorrhagic contusion with maximal transverse dimension of 1 x 0.9 cm versus prior 0.8 x 0.8 cm.  Minimal increase in amount of surrounding vasogenic edema.  Anterior left frontal lobe subdural hematoma without significant change measuring 3.2 x 1.1 cm.  Very small  anterior inferior falx subdural hematoma and possibly tiny amount of adjacent subarachnoid blood unchanged.  Global atrophy without hydrocephalus.  Chronic prominent extra-axial space left frontal region unchanged possibly a small subdural hygroma.  Small vessel disease type changes without CT evidence of large acute infarct.  No intracranial mass lesion noted on this unenhanced exam.  Nondisplaced right occipital fracture once again noted.  IMPRESSION: Bilateral anterior inferior frontal lobe hemorrhagic contusions (larger on the right) minimally changed since the 01/03/2015 exam. Right frontal hemorrhagic contusion with maximal transverse dimension of 2 x 1.2 cm versus prior 2 x 1 cm. Left frontal lobe hemorrhagic contusion with maximal transverse dimension of 1 x 0.9 cm versus prior 0.8 x 0.8 cm.  Minimal increase in amount of surrounding vasogenic edema.  Anterior left frontal lobe subdural hematoma without significant change measuring 3.2 x 1.1 cm.  Very small anterior inferior falx subdural hematoma and possibly tiny amount of adjacent subarachnoid blood unchanged.  Nondisplaced right occipital fracture once again noted.   Electronically Signed   By: Genia Del M.D.   On: 01/05/2015 07:02   Dg Hips Bilat With Pelvis Min 5 Views  01/05/2015   CLINICAL DATA:  Fall.  Hip and pelvic pain.  Initial encounter.  EXAM: BILATERAL HIP (WITH PELVIS) 5-6 VIEWS  COMPARISON:  None.  FINDINGS: There is no evidence of hip fracture or dislocation. No pelvic fracture identified.  Mild bilateral hip osteoarthritis noted. Degenerative  changes also seen involving the pubic symphysis and lower lumbar spine. Generalized osteopenia noted as well as peripheral vascular calcification.  IMPRESSION: No acute findings.  Mild bilateral hip osteoarthritis.  Pubic symphysis and lower lumbar spine degenerative changes.   Electronically Signed   By: Earle Gell M.D.   On: 01/05/2015 13:39    Assessment/Plan: Patient stable from a  neurosurgical perspective. CT the brain stable as well. Case discussed with Dr. Tyrell Antonio. Patient following discharge will need to follow-up with me in about 2 weeks, and have a CT the brain done without contrast the day the appointment. Patient will also need to follow-up with her primary physician and her cardiologist. However for the next month at least, the patient cannot be on any anticoagulants or antiplatelets agents.  I spoke today with the patient's husband and daughter about her condition, her CT scan results, our recommendations for activities following discharge from a neurosurgical perspective, the need to avoid anticoagulants and antiplatelets agents following discharge, and her follow-up with me in the office in 2 weeks. Their questions regarding her condition and recommendations were answered for them.  Hosie Spangle, MD 01/05/2015, 2:36 PM

## 2015-01-05 NOTE — Progress Notes (Signed)
Patient refused 200 am vitals according to [place] NT.

## 2015-01-07 DIAGNOSIS — I350 Nonrheumatic aortic (valve) stenosis: Secondary | ICD-10-CM | POA: Diagnosis not present

## 2015-01-07 DIAGNOSIS — Z9181 History of falling: Secondary | ICD-10-CM | POA: Diagnosis not present

## 2015-01-07 DIAGNOSIS — M150 Primary generalized (osteo)arthritis: Secondary | ICD-10-CM | POA: Diagnosis not present

## 2015-01-07 DIAGNOSIS — Z8673 Personal history of transient ischemic attack (TIA), and cerebral infarction without residual deficits: Secondary | ICD-10-CM | POA: Diagnosis not present

## 2015-01-07 DIAGNOSIS — F329 Major depressive disorder, single episode, unspecified: Secondary | ICD-10-CM | POA: Diagnosis not present

## 2015-01-07 DIAGNOSIS — M4802 Spinal stenosis, cervical region: Secondary | ICD-10-CM | POA: Diagnosis not present

## 2015-01-07 DIAGNOSIS — K219 Gastro-esophageal reflux disease without esophagitis: Secondary | ICD-10-CM | POA: Diagnosis not present

## 2015-01-07 DIAGNOSIS — S065X0D Traumatic subdural hemorrhage without loss of consciousness, subsequent encounter: Secondary | ICD-10-CM | POA: Diagnosis not present

## 2015-01-07 DIAGNOSIS — I4891 Unspecified atrial fibrillation: Secondary | ICD-10-CM | POA: Diagnosis not present

## 2015-01-07 DIAGNOSIS — S02119D Unspecified fracture of occiput, subsequent encounter for fracture with routine healing: Secondary | ICD-10-CM | POA: Diagnosis not present

## 2015-01-07 DIAGNOSIS — S066X0D Traumatic subarachnoid hemorrhage without loss of consciousness, subsequent encounter: Secondary | ICD-10-CM | POA: Diagnosis not present

## 2015-01-07 DIAGNOSIS — J45909 Unspecified asthma, uncomplicated: Secondary | ICD-10-CM | POA: Diagnosis not present

## 2015-01-08 DIAGNOSIS — S065X0D Traumatic subdural hemorrhage without loss of consciousness, subsequent encounter: Secondary | ICD-10-CM | POA: Diagnosis not present

## 2015-01-08 DIAGNOSIS — I4891 Unspecified atrial fibrillation: Secondary | ICD-10-CM | POA: Diagnosis not present

## 2015-01-08 DIAGNOSIS — S02119D Unspecified fracture of occiput, subsequent encounter for fracture with routine healing: Secondary | ICD-10-CM | POA: Diagnosis not present

## 2015-01-08 DIAGNOSIS — F329 Major depressive disorder, single episode, unspecified: Secondary | ICD-10-CM | POA: Diagnosis not present

## 2015-01-08 DIAGNOSIS — S066X0D Traumatic subarachnoid hemorrhage without loss of consciousness, subsequent encounter: Secondary | ICD-10-CM | POA: Diagnosis not present

## 2015-01-08 DIAGNOSIS — M4802 Spinal stenosis, cervical region: Secondary | ICD-10-CM | POA: Diagnosis not present

## 2015-01-09 DIAGNOSIS — I4891 Unspecified atrial fibrillation: Secondary | ICD-10-CM | POA: Diagnosis not present

## 2015-01-09 DIAGNOSIS — S066X0D Traumatic subarachnoid hemorrhage without loss of consciousness, subsequent encounter: Secondary | ICD-10-CM | POA: Diagnosis not present

## 2015-01-09 DIAGNOSIS — F329 Major depressive disorder, single episode, unspecified: Secondary | ICD-10-CM | POA: Diagnosis not present

## 2015-01-09 DIAGNOSIS — M4802 Spinal stenosis, cervical region: Secondary | ICD-10-CM | POA: Diagnosis not present

## 2015-01-09 DIAGNOSIS — S02119D Unspecified fracture of occiput, subsequent encounter for fracture with routine healing: Secondary | ICD-10-CM | POA: Diagnosis not present

## 2015-01-09 DIAGNOSIS — S065X0D Traumatic subdural hemorrhage without loss of consciousness, subsequent encounter: Secondary | ICD-10-CM | POA: Diagnosis not present

## 2015-01-10 DIAGNOSIS — S066X0D Traumatic subarachnoid hemorrhage without loss of consciousness, subsequent encounter: Secondary | ICD-10-CM | POA: Diagnosis not present

## 2015-01-10 DIAGNOSIS — I4891 Unspecified atrial fibrillation: Secondary | ICD-10-CM | POA: Diagnosis not present

## 2015-01-10 DIAGNOSIS — S065X0D Traumatic subdural hemorrhage without loss of consciousness, subsequent encounter: Secondary | ICD-10-CM | POA: Diagnosis not present

## 2015-01-10 DIAGNOSIS — F329 Major depressive disorder, single episode, unspecified: Secondary | ICD-10-CM | POA: Diagnosis not present

## 2015-01-10 DIAGNOSIS — M4802 Spinal stenosis, cervical region: Secondary | ICD-10-CM | POA: Diagnosis not present

## 2015-01-10 DIAGNOSIS — S02119D Unspecified fracture of occiput, subsequent encounter for fracture with routine healing: Secondary | ICD-10-CM | POA: Diagnosis not present

## 2015-01-12 DIAGNOSIS — S02119D Unspecified fracture of occiput, subsequent encounter for fracture with routine healing: Secondary | ICD-10-CM | POA: Diagnosis not present

## 2015-01-12 DIAGNOSIS — M4802 Spinal stenosis, cervical region: Secondary | ICD-10-CM | POA: Diagnosis not present

## 2015-01-12 DIAGNOSIS — I4891 Unspecified atrial fibrillation: Secondary | ICD-10-CM | POA: Diagnosis not present

## 2015-01-12 DIAGNOSIS — S065X0D Traumatic subdural hemorrhage without loss of consciousness, subsequent encounter: Secondary | ICD-10-CM | POA: Diagnosis not present

## 2015-01-12 DIAGNOSIS — S066X0D Traumatic subarachnoid hemorrhage without loss of consciousness, subsequent encounter: Secondary | ICD-10-CM | POA: Diagnosis not present

## 2015-01-12 DIAGNOSIS — F329 Major depressive disorder, single episode, unspecified: Secondary | ICD-10-CM | POA: Diagnosis not present

## 2015-01-13 DIAGNOSIS — I4891 Unspecified atrial fibrillation: Secondary | ICD-10-CM | POA: Diagnosis not present

## 2015-01-13 DIAGNOSIS — S066X0D Traumatic subarachnoid hemorrhage without loss of consciousness, subsequent encounter: Secondary | ICD-10-CM | POA: Diagnosis not present

## 2015-01-13 DIAGNOSIS — F329 Major depressive disorder, single episode, unspecified: Secondary | ICD-10-CM | POA: Diagnosis not present

## 2015-01-13 DIAGNOSIS — S065X0D Traumatic subdural hemorrhage without loss of consciousness, subsequent encounter: Secondary | ICD-10-CM | POA: Diagnosis not present

## 2015-01-13 DIAGNOSIS — M4802 Spinal stenosis, cervical region: Secondary | ICD-10-CM | POA: Diagnosis not present

## 2015-01-13 DIAGNOSIS — S02119D Unspecified fracture of occiput, subsequent encounter for fracture with routine healing: Secondary | ICD-10-CM | POA: Diagnosis not present

## 2015-01-16 DIAGNOSIS — S066X0D Traumatic subarachnoid hemorrhage without loss of consciousness, subsequent encounter: Secondary | ICD-10-CM | POA: Diagnosis not present

## 2015-01-16 DIAGNOSIS — M4802 Spinal stenosis, cervical region: Secondary | ICD-10-CM | POA: Diagnosis not present

## 2015-01-16 DIAGNOSIS — I4891 Unspecified atrial fibrillation: Secondary | ICD-10-CM | POA: Diagnosis not present

## 2015-01-16 DIAGNOSIS — S02119D Unspecified fracture of occiput, subsequent encounter for fracture with routine healing: Secondary | ICD-10-CM | POA: Diagnosis not present

## 2015-01-16 DIAGNOSIS — F329 Major depressive disorder, single episode, unspecified: Secondary | ICD-10-CM | POA: Diagnosis not present

## 2015-01-16 DIAGNOSIS — S065X0D Traumatic subdural hemorrhage without loss of consciousness, subsequent encounter: Secondary | ICD-10-CM | POA: Diagnosis not present

## 2015-01-17 DIAGNOSIS — S02119D Unspecified fracture of occiput, subsequent encounter for fracture with routine healing: Secondary | ICD-10-CM | POA: Diagnosis not present

## 2015-01-17 DIAGNOSIS — S065X0D Traumatic subdural hemorrhage without loss of consciousness, subsequent encounter: Secondary | ICD-10-CM | POA: Diagnosis not present

## 2015-01-17 DIAGNOSIS — I4891 Unspecified atrial fibrillation: Secondary | ICD-10-CM | POA: Diagnosis not present

## 2015-01-17 DIAGNOSIS — M4802 Spinal stenosis, cervical region: Secondary | ICD-10-CM | POA: Diagnosis not present

## 2015-01-17 DIAGNOSIS — F329 Major depressive disorder, single episode, unspecified: Secondary | ICD-10-CM | POA: Diagnosis not present

## 2015-01-17 DIAGNOSIS — S066X0D Traumatic subarachnoid hemorrhage without loss of consciousness, subsequent encounter: Secondary | ICD-10-CM | POA: Diagnosis not present

## 2015-01-18 DIAGNOSIS — S02119D Unspecified fracture of occiput, subsequent encounter for fracture with routine healing: Secondary | ICD-10-CM | POA: Diagnosis not present

## 2015-01-18 DIAGNOSIS — S066X0D Traumatic subarachnoid hemorrhage without loss of consciousness, subsequent encounter: Secondary | ICD-10-CM | POA: Diagnosis not present

## 2015-01-18 DIAGNOSIS — M4802 Spinal stenosis, cervical region: Secondary | ICD-10-CM | POA: Diagnosis not present

## 2015-01-18 DIAGNOSIS — S065X0D Traumatic subdural hemorrhage without loss of consciousness, subsequent encounter: Secondary | ICD-10-CM | POA: Diagnosis not present

## 2015-01-18 DIAGNOSIS — I4891 Unspecified atrial fibrillation: Secondary | ICD-10-CM | POA: Diagnosis not present

## 2015-01-18 DIAGNOSIS — F329 Major depressive disorder, single episode, unspecified: Secondary | ICD-10-CM | POA: Diagnosis not present

## 2015-01-19 ENCOUNTER — Other Ambulatory Visit: Payer: Self-pay | Admitting: Neurosurgery

## 2015-01-19 DIAGNOSIS — I4891 Unspecified atrial fibrillation: Secondary | ICD-10-CM | POA: Diagnosis not present

## 2015-01-19 DIAGNOSIS — S066X0D Traumatic subarachnoid hemorrhage without loss of consciousness, subsequent encounter: Secondary | ICD-10-CM | POA: Diagnosis not present

## 2015-01-19 DIAGNOSIS — M4802 Spinal stenosis, cervical region: Secondary | ICD-10-CM | POA: Diagnosis not present

## 2015-01-19 DIAGNOSIS — S065X0D Traumatic subdural hemorrhage without loss of consciousness, subsequent encounter: Secondary | ICD-10-CM | POA: Diagnosis not present

## 2015-01-19 DIAGNOSIS — F329 Major depressive disorder, single episode, unspecified: Secondary | ICD-10-CM | POA: Diagnosis not present

## 2015-01-19 DIAGNOSIS — S02119D Unspecified fracture of occiput, subsequent encounter for fracture with routine healing: Secondary | ICD-10-CM | POA: Diagnosis not present

## 2015-01-20 ENCOUNTER — Ambulatory Visit
Admission: RE | Admit: 2015-01-20 | Discharge: 2015-01-20 | Disposition: A | Discharge status: Home / Self Care | Payer: Medicare Other | Source: Ambulatory Visit | Attending: Neurosurgery | Admitting: Neurosurgery

## 2015-01-20 DIAGNOSIS — S069X1D Unspecified intracranial injury with loss of consciousness of 30 minutes or less, subsequent encounter: Secondary | ICD-10-CM | POA: Diagnosis not present

## 2015-01-20 DIAGNOSIS — S02119D Unspecified fracture of occiput, subsequent encounter for fracture with routine healing: Secondary | ICD-10-CM | POA: Diagnosis not present

## 2015-01-20 DIAGNOSIS — S066X0D Traumatic subarachnoid hemorrhage without loss of consciousness, subsequent encounter: Secondary | ICD-10-CM

## 2015-01-24 DIAGNOSIS — S065X0D Traumatic subdural hemorrhage without loss of consciousness, subsequent encounter: Secondary | ICD-10-CM | POA: Diagnosis not present

## 2015-01-24 DIAGNOSIS — I4891 Unspecified atrial fibrillation: Secondary | ICD-10-CM | POA: Diagnosis not present

## 2015-01-24 DIAGNOSIS — S02119D Unspecified fracture of occiput, subsequent encounter for fracture with routine healing: Secondary | ICD-10-CM | POA: Diagnosis not present

## 2015-01-24 DIAGNOSIS — S066X0D Traumatic subarachnoid hemorrhage without loss of consciousness, subsequent encounter: Secondary | ICD-10-CM | POA: Diagnosis not present

## 2015-01-24 DIAGNOSIS — M4802 Spinal stenosis, cervical region: Secondary | ICD-10-CM | POA: Diagnosis not present

## 2015-01-24 DIAGNOSIS — F329 Major depressive disorder, single episode, unspecified: Secondary | ICD-10-CM | POA: Diagnosis not present

## 2015-01-25 DIAGNOSIS — S066X0D Traumatic subarachnoid hemorrhage without loss of consciousness, subsequent encounter: Secondary | ICD-10-CM | POA: Diagnosis not present

## 2015-01-25 DIAGNOSIS — M4802 Spinal stenosis, cervical region: Secondary | ICD-10-CM | POA: Diagnosis not present

## 2015-01-25 DIAGNOSIS — S02119D Unspecified fracture of occiput, subsequent encounter for fracture with routine healing: Secondary | ICD-10-CM | POA: Diagnosis not present

## 2015-01-25 DIAGNOSIS — F329 Major depressive disorder, single episode, unspecified: Secondary | ICD-10-CM | POA: Diagnosis not present

## 2015-01-25 DIAGNOSIS — I4891 Unspecified atrial fibrillation: Secondary | ICD-10-CM | POA: Diagnosis not present

## 2015-01-25 DIAGNOSIS — S065X0D Traumatic subdural hemorrhage without loss of consciousness, subsequent encounter: Secondary | ICD-10-CM | POA: Diagnosis not present

## 2015-01-26 DIAGNOSIS — S065X0D Traumatic subdural hemorrhage without loss of consciousness, subsequent encounter: Secondary | ICD-10-CM | POA: Diagnosis not present

## 2015-01-26 DIAGNOSIS — S02119D Unspecified fracture of occiput, subsequent encounter for fracture with routine healing: Secondary | ICD-10-CM | POA: Diagnosis not present

## 2015-01-26 DIAGNOSIS — I4891 Unspecified atrial fibrillation: Secondary | ICD-10-CM | POA: Diagnosis not present

## 2015-01-26 DIAGNOSIS — M4802 Spinal stenosis, cervical region: Secondary | ICD-10-CM | POA: Diagnosis not present

## 2015-01-26 DIAGNOSIS — F329 Major depressive disorder, single episode, unspecified: Secondary | ICD-10-CM | POA: Diagnosis not present

## 2015-01-26 DIAGNOSIS — S066X0D Traumatic subarachnoid hemorrhage without loss of consciousness, subsequent encounter: Secondary | ICD-10-CM | POA: Diagnosis not present

## 2015-01-27 DIAGNOSIS — F329 Major depressive disorder, single episode, unspecified: Secondary | ICD-10-CM | POA: Diagnosis not present

## 2015-01-27 DIAGNOSIS — I4891 Unspecified atrial fibrillation: Secondary | ICD-10-CM | POA: Diagnosis not present

## 2015-01-27 DIAGNOSIS — S02119D Unspecified fracture of occiput, subsequent encounter for fracture with routine healing: Secondary | ICD-10-CM | POA: Diagnosis not present

## 2015-01-27 DIAGNOSIS — M4802 Spinal stenosis, cervical region: Secondary | ICD-10-CM | POA: Diagnosis not present

## 2015-01-27 DIAGNOSIS — S065X0D Traumatic subdural hemorrhage without loss of consciousness, subsequent encounter: Secondary | ICD-10-CM | POA: Diagnosis not present

## 2015-01-27 DIAGNOSIS — S066X0D Traumatic subarachnoid hemorrhage without loss of consciousness, subsequent encounter: Secondary | ICD-10-CM | POA: Diagnosis not present

## 2015-01-30 DIAGNOSIS — S02119D Unspecified fracture of occiput, subsequent encounter for fracture with routine healing: Secondary | ICD-10-CM | POA: Diagnosis not present

## 2015-01-30 DIAGNOSIS — S065X0D Traumatic subdural hemorrhage without loss of consciousness, subsequent encounter: Secondary | ICD-10-CM | POA: Diagnosis not present

## 2015-01-30 DIAGNOSIS — F329 Major depressive disorder, single episode, unspecified: Secondary | ICD-10-CM | POA: Diagnosis not present

## 2015-01-30 DIAGNOSIS — M4802 Spinal stenosis, cervical region: Secondary | ICD-10-CM | POA: Diagnosis not present

## 2015-01-30 DIAGNOSIS — I4891 Unspecified atrial fibrillation: Secondary | ICD-10-CM | POA: Diagnosis not present

## 2015-01-30 DIAGNOSIS — S066X0D Traumatic subarachnoid hemorrhage without loss of consciousness, subsequent encounter: Secondary | ICD-10-CM | POA: Diagnosis not present

## 2015-02-01 DIAGNOSIS — S02119D Unspecified fracture of occiput, subsequent encounter for fracture with routine healing: Secondary | ICD-10-CM | POA: Diagnosis not present

## 2015-02-01 DIAGNOSIS — S066X0D Traumatic subarachnoid hemorrhage without loss of consciousness, subsequent encounter: Secondary | ICD-10-CM | POA: Diagnosis not present

## 2015-02-01 DIAGNOSIS — M4802 Spinal stenosis, cervical region: Secondary | ICD-10-CM | POA: Diagnosis not present

## 2015-02-01 DIAGNOSIS — I4891 Unspecified atrial fibrillation: Secondary | ICD-10-CM | POA: Diagnosis not present

## 2015-02-01 DIAGNOSIS — F329 Major depressive disorder, single episode, unspecified: Secondary | ICD-10-CM | POA: Diagnosis not present

## 2015-02-01 DIAGNOSIS — S065X0D Traumatic subdural hemorrhage without loss of consciousness, subsequent encounter: Secondary | ICD-10-CM | POA: Diagnosis not present

## 2015-02-02 DIAGNOSIS — H0011 Chalazion right upper eyelid: Secondary | ICD-10-CM | POA: Diagnosis not present

## 2015-02-03 DIAGNOSIS — F329 Major depressive disorder, single episode, unspecified: Secondary | ICD-10-CM | POA: Diagnosis not present

## 2015-02-03 DIAGNOSIS — S02119D Unspecified fracture of occiput, subsequent encounter for fracture with routine healing: Secondary | ICD-10-CM | POA: Diagnosis not present

## 2015-02-03 DIAGNOSIS — I4891 Unspecified atrial fibrillation: Secondary | ICD-10-CM | POA: Diagnosis not present

## 2015-02-03 DIAGNOSIS — S066X0D Traumatic subarachnoid hemorrhage without loss of consciousness, subsequent encounter: Secondary | ICD-10-CM | POA: Diagnosis not present

## 2015-02-03 DIAGNOSIS — M4802 Spinal stenosis, cervical region: Secondary | ICD-10-CM | POA: Diagnosis not present

## 2015-02-03 DIAGNOSIS — S065X0D Traumatic subdural hemorrhage without loss of consciousness, subsequent encounter: Secondary | ICD-10-CM | POA: Diagnosis not present

## 2015-02-07 DIAGNOSIS — I4891 Unspecified atrial fibrillation: Secondary | ICD-10-CM | POA: Diagnosis not present

## 2015-02-07 DIAGNOSIS — F329 Major depressive disorder, single episode, unspecified: Secondary | ICD-10-CM | POA: Diagnosis not present

## 2015-02-07 DIAGNOSIS — S065X0D Traumatic subdural hemorrhage without loss of consciousness, subsequent encounter: Secondary | ICD-10-CM | POA: Diagnosis not present

## 2015-02-07 DIAGNOSIS — S066X0D Traumatic subarachnoid hemorrhage without loss of consciousness, subsequent encounter: Secondary | ICD-10-CM | POA: Diagnosis not present

## 2015-02-07 DIAGNOSIS — S02119D Unspecified fracture of occiput, subsequent encounter for fracture with routine healing: Secondary | ICD-10-CM | POA: Diagnosis not present

## 2015-02-07 DIAGNOSIS — M4802 Spinal stenosis, cervical region: Secondary | ICD-10-CM | POA: Diagnosis not present

## 2015-02-09 DIAGNOSIS — M4802 Spinal stenosis, cervical region: Secondary | ICD-10-CM | POA: Diagnosis not present

## 2015-02-09 DIAGNOSIS — I4891 Unspecified atrial fibrillation: Secondary | ICD-10-CM | POA: Diagnosis not present

## 2015-02-09 DIAGNOSIS — S066X0D Traumatic subarachnoid hemorrhage without loss of consciousness, subsequent encounter: Secondary | ICD-10-CM | POA: Diagnosis not present

## 2015-02-09 DIAGNOSIS — F329 Major depressive disorder, single episode, unspecified: Secondary | ICD-10-CM | POA: Diagnosis not present

## 2015-02-09 DIAGNOSIS — S02119D Unspecified fracture of occiput, subsequent encounter for fracture with routine healing: Secondary | ICD-10-CM | POA: Diagnosis not present

## 2015-02-09 DIAGNOSIS — S065X0D Traumatic subdural hemorrhage without loss of consciousness, subsequent encounter: Secondary | ICD-10-CM | POA: Diagnosis not present

## 2015-02-10 DIAGNOSIS — M4802 Spinal stenosis, cervical region: Secondary | ICD-10-CM | POA: Diagnosis not present

## 2015-02-10 DIAGNOSIS — F329 Major depressive disorder, single episode, unspecified: Secondary | ICD-10-CM | POA: Diagnosis not present

## 2015-02-10 DIAGNOSIS — S065X0D Traumatic subdural hemorrhage without loss of consciousness, subsequent encounter: Secondary | ICD-10-CM | POA: Diagnosis not present

## 2015-02-10 DIAGNOSIS — I4891 Unspecified atrial fibrillation: Secondary | ICD-10-CM | POA: Diagnosis not present

## 2015-02-10 DIAGNOSIS — S066X0D Traumatic subarachnoid hemorrhage without loss of consciousness, subsequent encounter: Secondary | ICD-10-CM | POA: Diagnosis not present

## 2015-02-10 DIAGNOSIS — S02119D Unspecified fracture of occiput, subsequent encounter for fracture with routine healing: Secondary | ICD-10-CM | POA: Diagnosis not present

## 2015-02-14 ENCOUNTER — Encounter (HOSPITAL_COMMUNITY): Payer: Self-pay | Admitting: Emergency Medicine

## 2015-02-14 ENCOUNTER — Other Ambulatory Visit (HOSPITAL_COMMUNITY): Payer: Medicare Other

## 2015-02-14 ENCOUNTER — Inpatient Hospital Stay (HOSPITAL_COMMUNITY)
Admission: EM | Admit: 2015-02-14 | Discharge: 2015-02-15 | DRG: 065 | Disposition: A | Discharge status: Home Health | Payer: Medicare Other | Attending: Internal Medicine | Admitting: Internal Medicine

## 2015-02-14 ENCOUNTER — Inpatient Hospital Stay (HOSPITAL_COMMUNITY): Payer: Medicare Other

## 2015-02-14 ENCOUNTER — Emergency Department (HOSPITAL_COMMUNITY): Payer: Medicare Other

## 2015-02-14 DIAGNOSIS — Z79899 Other long term (current) drug therapy: Secondary | ICD-10-CM | POA: Diagnosis not present

## 2015-02-14 DIAGNOSIS — I63412 Cerebral infarction due to embolism of left middle cerebral artery: Secondary | ICD-10-CM | POA: Diagnosis not present

## 2015-02-14 DIAGNOSIS — S066X0D Traumatic subarachnoid hemorrhage without loss of consciousness, subsequent encounter: Secondary | ICD-10-CM | POA: Diagnosis not present

## 2015-02-14 DIAGNOSIS — I482 Chronic atrial fibrillation: Secondary | ICD-10-CM | POA: Diagnosis present

## 2015-02-14 DIAGNOSIS — M1990 Unspecified osteoarthritis, unspecified site: Secondary | ICD-10-CM | POA: Diagnosis present

## 2015-02-14 DIAGNOSIS — Z66 Do not resuscitate: Secondary | ICD-10-CM | POA: Diagnosis present

## 2015-02-14 DIAGNOSIS — S065X0D Traumatic subdural hemorrhage without loss of consciousness, subsequent encounter: Secondary | ICD-10-CM | POA: Diagnosis not present

## 2015-02-14 DIAGNOSIS — Z85828 Personal history of other malignant neoplasm of skin: Secondary | ICD-10-CM | POA: Diagnosis not present

## 2015-02-14 DIAGNOSIS — I480 Paroxysmal atrial fibrillation: Secondary | ICD-10-CM | POA: Diagnosis present

## 2015-02-14 DIAGNOSIS — I509 Heart failure, unspecified: Secondary | ICD-10-CM | POA: Diagnosis present

## 2015-02-14 DIAGNOSIS — R404 Transient alteration of awareness: Secondary | ICD-10-CM | POA: Diagnosis not present

## 2015-02-14 DIAGNOSIS — I350 Nonrheumatic aortic (valve) stenosis: Secondary | ICD-10-CM | POA: Diagnosis present

## 2015-02-14 DIAGNOSIS — M4802 Spinal stenosis, cervical region: Secondary | ICD-10-CM | POA: Diagnosis not present

## 2015-02-14 DIAGNOSIS — K219 Gastro-esophageal reflux disease without esophagitis: Secondary | ICD-10-CM | POA: Diagnosis present

## 2015-02-14 DIAGNOSIS — G9389 Other specified disorders of brain: Secondary | ICD-10-CM | POA: Diagnosis present

## 2015-02-14 DIAGNOSIS — Z7951 Long term (current) use of inhaled steroids: Secondary | ICD-10-CM | POA: Diagnosis not present

## 2015-02-14 DIAGNOSIS — I10 Essential (primary) hypertension: Secondary | ICD-10-CM | POA: Diagnosis present

## 2015-02-14 DIAGNOSIS — E119 Type 2 diabetes mellitus without complications: Secondary | ICD-10-CM | POA: Diagnosis present

## 2015-02-14 DIAGNOSIS — I4892 Unspecified atrial flutter: Secondary | ICD-10-CM | POA: Diagnosis present

## 2015-02-14 DIAGNOSIS — R531 Weakness: Secondary | ICD-10-CM | POA: Diagnosis not present

## 2015-02-14 DIAGNOSIS — I63419 Cerebral infarction due to embolism of unspecified middle cerebral artery: Secondary | ICD-10-CM | POA: Diagnosis present

## 2015-02-14 DIAGNOSIS — F329 Major depressive disorder, single episode, unspecified: Secondary | ICD-10-CM | POA: Diagnosis not present

## 2015-02-14 DIAGNOSIS — I672 Cerebral atherosclerosis: Secondary | ICD-10-CM | POA: Diagnosis not present

## 2015-02-14 DIAGNOSIS — E785 Hyperlipidemia, unspecified: Secondary | ICD-10-CM | POA: Diagnosis present

## 2015-02-14 DIAGNOSIS — I639 Cerebral infarction, unspecified: Secondary | ICD-10-CM | POA: Diagnosis not present

## 2015-02-14 DIAGNOSIS — S02119D Unspecified fracture of occiput, subsequent encounter for fracture with routine healing: Secondary | ICD-10-CM | POA: Diagnosis not present

## 2015-02-14 DIAGNOSIS — I4891 Unspecified atrial fibrillation: Secondary | ICD-10-CM | POA: Diagnosis not present

## 2015-02-14 DIAGNOSIS — R0602 Shortness of breath: Secondary | ICD-10-CM | POA: Diagnosis not present

## 2015-02-14 DIAGNOSIS — I6340 Cerebral infarction due to embolism of unspecified cerebral artery: Secondary | ICD-10-CM

## 2015-02-14 LAB — CBC
HEMATOCRIT: 41.7 % (ref 36.0–46.0)
Hemoglobin: 13.5 g/dL (ref 12.0–15.0)
MCH: 32.5 pg (ref 26.0–34.0)
MCHC: 32.4 g/dL (ref 30.0–36.0)
MCV: 100.5 fL — ABNORMAL HIGH (ref 78.0–100.0)
Platelets: 188 10*3/uL (ref 150–400)
RBC: 4.15 MIL/uL (ref 3.87–5.11)
RDW: 13.5 % (ref 11.5–15.5)
WBC: 6.2 10*3/uL (ref 4.0–10.5)

## 2015-02-14 LAB — COMPREHENSIVE METABOLIC PANEL
ALBUMIN: 3.3 g/dL — AB (ref 3.5–5.0)
ALK PHOS: 81 U/L (ref 38–126)
ALT: 15 U/L (ref 14–54)
ALT: 15 U/L (ref 14–54)
AST: 26 U/L (ref 15–41)
AST: 30 U/L (ref 15–41)
Albumin: 3.3 g/dL — ABNORMAL LOW (ref 3.5–5.0)
Alkaline Phosphatase: 84 U/L (ref 38–126)
Anion gap: 9 (ref 5–15)
Anion gap: 9 (ref 5–15)
BILIRUBIN TOTAL: 0.5 mg/dL (ref 0.3–1.2)
BUN: 22 mg/dL — AB (ref 6–20)
BUN: 17 mg/dL (ref 6–20)
CALCIUM: 8.9 mg/dL (ref 8.9–10.3)
CHLORIDE: 105 mmol/L (ref 101–111)
CO2: 27 mmol/L (ref 22–32)
CO2: 26 mmol/L (ref 22–32)
CREATININE: 0.77 mg/dL (ref 0.44–1.00)
Calcium: 8.9 mg/dL (ref 8.9–10.3)
Chloride: 102 mmol/L (ref 101–111)
Creatinine, Ser: 0.85 mg/dL (ref 0.44–1.00)
GFR calc Af Amer: >60 mL/min (ref >60)
GFR calc Af Amer: >60 mL/min (ref >60)
GFR calc non Af Amer: >60 mL/min (ref >60)
GFR, EST NON AFRICAN AMERICAN: 59 mL/min — AB (ref >60)
GLUCOSE: 91 mg/dL (ref 65–99)
Glucose, Bld: 128 mg/dL — ABNORMAL HIGH (ref 65–99)
Potassium: 4 mmol/L (ref 3.5–5.1)
Potassium: 3.7 mmol/L (ref 3.5–5.1)
SODIUM: 140 mmol/L (ref 135–145)
Sodium: 138 mmol/L (ref 135–145)
TOTAL PROTEIN: 6.7 g/dL (ref 6.5–8.1)
Total Bilirubin: 0.7 mg/dL (ref 0.3–1.2)
Total Protein: 6.8 g/dL (ref 6.5–8.1)

## 2015-02-14 LAB — URINALYSIS, ROUTINE W REFLEX MICROSCOPIC
BILIRUBIN URINE: NEGATIVE
Glucose, UA: NEGATIVE mg/dL
Hgb urine dipstick: NEGATIVE
Ketones, ur: NEGATIVE mg/dL
NITRITE: NEGATIVE
PROTEIN: NEGATIVE mg/dL
Specific Gravity, Urine: 1.009 (ref 1.005–1.030)
UROBILINOGEN UA: 0.2 mg/dL (ref 0.0–1.0)
pH: 6.5 (ref 5.0–8.0)

## 2015-02-14 LAB — DIFFERENTIAL
BASOS ABS: 0 10*3/uL (ref 0.0–0.1)
Basophils Relative: 1 % (ref 0–1)
Eosinophils Absolute: 0.4 10*3/uL (ref 0.0–0.7)
Eosinophils Relative: 7 % — ABNORMAL HIGH (ref 0–5)
LYMPHS PCT: 24 % (ref 12–46)
Lymphs Abs: 1.5 10*3/uL (ref 0.7–4.0)
MONOS PCT: 12 % (ref 3–12)
Monocytes Absolute: 0.7 10*3/uL (ref 0.1–1.0)
NEUTROS PCT: 56 % (ref 43–77)
Neutro Abs: 3.5 10*3/uL (ref 1.7–7.7)

## 2015-02-14 LAB — I-STAT CHEM 8, ED
BUN: 28 mg/dL — AB (ref 6–20)
CREATININE: 0.8 mg/dL (ref 0.44–1.00)
Calcium, Ion: 1.12 mmol/L — ABNORMAL LOW (ref 1.13–1.30)
Chloride: 102 mmol/L (ref 101–111)
GLUCOSE: 89 mg/dL (ref 65–99)
HEMATOCRIT: 44 % (ref 36.0–46.0)
Hemoglobin: 15 g/dL (ref 12.0–15.0)
Potassium: 3.9 mmol/L (ref 3.5–5.1)
Sodium: 139 mmol/L (ref 135–145)
TCO2: 27 mmol/L (ref 0–100)

## 2015-02-14 LAB — APTT: aPTT: 31 seconds (ref 24–37)

## 2015-02-14 LAB — URINE MICROSCOPIC-ADD ON

## 2015-02-14 LAB — RAPID URINE DRUG SCREEN, HOSP PERFORMED
Amphetamines: NOT DETECTED
BARBITURATES: NOT DETECTED
Benzodiazepines: NOT DETECTED
Cocaine: NOT DETECTED
Opiates: NOT DETECTED
Tetrahydrocannabinol: NOT DETECTED

## 2015-02-14 LAB — ETHANOL: Alcohol, Ethyl (B): <5 mg/dL (ref <5)

## 2015-02-14 LAB — PROTIME-INR
INR: 1.1 (ref 0.00–1.49)
Prothrombin Time: 14.4 seconds (ref 11.6–15.2)

## 2015-02-14 LAB — I-STAT TROPONIN, ED: Troponin i, poc: 0.02 ng/mL (ref 0.00–0.08)

## 2015-02-14 MED ORDER — SERTRALINE HCL 50 MG PO TABS
50.0000 mg | ORAL_TABLET | Freq: Every day | ORAL | Status: DC
  Administered 2015-02-14 – 2015-02-15 (×2): 50 mg via ORAL
  Filled 2015-02-14 (×2): qty 1

## 2015-02-14 MED ORDER — SODIUM CHLORIDE 0.9 % IV SOLN
INTRAVENOUS | Status: AC
  Administered 2015-02-14: 50 mL/h via INTRAVENOUS

## 2015-02-14 MED ORDER — STROKE: EARLY STAGES OF RECOVERY BOOK
Freq: Once | Status: AC
  Administered 2015-02-14: 08:00:00

## 2015-02-14 MED ORDER — SENNOSIDES-DOCUSATE SODIUM 8.6-50 MG PO TABS: 1.0000 | ORAL_TABLET | Freq: Every evening | ORAL | Status: DC | PRN

## 2015-02-14 NOTE — ED Notes (Signed)
Patient woke today around 0100 this morning with right arm weakness.  Patient is unable to hold right arm up, not a good grip on the right.  Patient LSN around 2300 last night.  CBG 114 by EMS.  There is no slurred speech or facial droop, equal pedal pushes.  Patient is CAOx4.

## 2015-02-14 NOTE — CV Procedure (Signed)
On 02/14/2015 at 7:30 AM unable to due exams due to the relocation of [place]. Please advise when the patient is ready to be evaluated.   Darlina Sicilian [facility] 02/14/2015 7:52 AM

## 2015-02-14 NOTE — Care Management Note (Signed)
Case Management Note  Patient Details  Name: Caroline Myers MRN: [id number] Date of Birth: 11-14-1923  Subjective/Objective:                    Action/Plan: Patient was admitted with a CVA. Lives at home with spouse. She was recently discharged from the hospital with HHPT services through [facility].  [name] with [facility] was notified of patient's admission.  Awaiting PT/OT evals to determine final disposition.  Expected Discharge Date:                  Expected Discharge Plan:  [place]  In-House Referral:     Discharge planning Services     Post Acute Care Choice:    Choice offered to:     DME Arranged:    DME Agency:     HH Arranged:    [place] Agency:     Status of Service:  In process, will continue to follow  Medicare Important Message Given:    Date Medicare IM Given:    Medicare IM give by:    Date Additional Medicare IM Given:    Additional Medicare Important Message give by:     If discussed at [place] of Stay Meetings, dates discussed:    Additional Comments:  Rolm Baptise, RN 02/14/2015, 12:48 PM

## 2015-02-14 NOTE — ED Notes (Signed)
hospitalist at the bedside 

## 2015-02-14 NOTE — H&P (Signed)
[facility] History and Physical  Caroline Myers PZW:[id number] DOB: 02-14-1924 DOA: 02/14/2015  Referring physician: Dr. Nicholes Stairs. PCP:  Melinda Crutch, MD  Specialists: None.  Chief Complaint: Right upper extremity weakness.  HPI: Caroline Myers is a 81 y.o. female with known history of chronic atrial fibrillation who was admitted last month for subarachnoid hemorrhage and subdural hemorrhage with occipital skull fracture after patient had a fall. At that time patient was on Apixaban was discontinued. Patient last evening went to bed around 11 PM and at around 1 AM patient got up to go to the bathroom and patient noticed her right upper extremity was weak and unable to be raised against gravity. Patient was brought to the ER and CT head did not show anything acute. Patient still has weakness of the right upper extremity and has been admitted for further management of stroke. Patient states she has chronic difficulty with speech and has no new changes. Denies any difficulty with vision or swallowing. Denies any weakness of the other extremities.  Review of Systems: As presented in the history of presenting illness, rest negative.  Past Medical History  Diagnosis Date  . Allergy     allergic rhinitis  . Cancer     history of skin cancer of the nose  . Asthma 2000    mild. tried Advair after bronchospasm after exposure to cats  . Shoulder pain, left 05/2010    with rotator cuff tear--Dr Alfonso Ramus  . Paroxysmal atrial fibrillation     pt does not want anticoagulation  . Aortic stenosis     EF normal.  Mild aortic stenosis. Mild to moderate aortic regurgitation  . Anxiety   . Shortness of breath     occ  . GERD (gastroesophageal reflux disease)     occ  . Arthritis    Past Surgical History  Procedure Laterality Date  . Hernia repair  2003  . Acromioplasty  2000    right shoulder, arthroscopic  . Lumbar laminectomy/decompression microdiscectomy  04/01/2012    Procedure: LUMBAR  LAMINECTOMY/DECOMPRESSION MICRODISCECTOMY 1 LEVEL;  Surgeon: Eustace Moore, MD;  Location: [place] [facility];  Service: Neurosurgery;  Laterality: Right;  Right Lumbar four-five extraforaminal microdiskectomy    Social History:  reports that she has never smoked. She has never used smokeless tobacco. She reports that she drinks alcohol. She reports that she does not use illicit drugs. Where does patient live home. Can patient participate in ADLs? Yes.  Allergies  Allergen Reactions  . Tape Other (See Comments)    Rips skin    Family History:  Family History  Problem Relation Age of Onset  . Heart attack Neg Hx   . Stroke Sister       Prior to Admission medications   Medication Sig Start Date End Date Taking? Authorizing Provider  sertraline (ZOLOFT) 50 MG tablet Take 50 mg by mouth daily. 11/24/14  Yes Historical Provider, MD  acetaminophen (TYLENOL) 325 MG tablet Take 2 tablets (650 mg total) by mouth every 6 (six) hours as needed for mild pain (or Fever >/= 101). Patient not taking: Reported on 02/14/2015 01/05/15   Elmarie Shiley, MD    Physical Exam: Filed Vitals:   02/14/15 0415 02/14/15 0433 02/14/15 0445 02/14/15 0515  BP: 153/89 141/85 139/81 146/78  Pulse: 78 82 70 74  Temp:      TempSrc:      Resp: 20 17 20 20   Height:      Weight:  SpO2: 95% 94% 94% 94%     General:  Moderately built and nourished.  Eyes: Anicteric no pallor.  ENT: No discharge from the ears eyes nose and mouth.  Neck: No mass felt. No neck rigidity.  Cardiovascular: S1-S2 heard.  Respiratory: No rhonchi or crepitations.  Abdomen: Soft nontender bowel sounds present.  Skin: No rash.  Musculoskeletal: No edema.  Psychiatric: Appears normal.  Neurologic: Alert awake oriented to time place and person. Patient's right upper extremity is 2 x 5 in strength and rest of the extremities are 5 over 5 in strength. No facial asymmetry. Tongue is midline. PERRLA positive.  Labs on Admission:   Basic Metabolic Panel:  Recent Labs Lab 02/14/15 0252 02/14/15 0306  NA 138 139  K 4.0 3.9  CL 102 102  CO2 27  --   GLUCOSE 91 89  BUN 22* 28*  CREATININE 0.85 0.80  CALCIUM 8.9  --    Liver Function Tests:  Recent Labs Lab 02/14/15 0252  AST 26  ALT 15  ALKPHOS 81  BILITOT 0.5  PROT 6.7  ALBUMIN 3.3*   No results for input(s): LIPASE, AMYLASE in the last 168 hours. No results for input(s): AMMONIA in the last 168 hours. CBC:  Recent Labs Lab 02/14/15 0252 02/14/15 0306  WBC 6.2  --   NEUTROABS 3.5  --   HGB 13.5 15.0  HCT 41.7 44.0  MCV 100.5*  --   PLT 188  --    Cardiac Enzymes: No results for input(s): CKTOTAL, CKMB, CKMBINDEX, TROPONINI in the last 168 hours.  BNP (last 3 results) No results for input(s): BNP in the last 8760 hours.  ProBNP (last 3 results) No results for input(s): PROBNP in the last 8760 hours.  CBG: No results for input(s): GLUCAP in the last 168 hours.  Radiological Exams on Admission: Ct Head Wo Contrast  02/14/2015   CLINICAL DATA:  Right hand weakness.  Code stroke.  EXAM: CT HEAD WITHOUT CONTRAST  TECHNIQUE: Contiguous axial images were obtained from the base of the skull through the vertex without intravenous contrast.  COMPARISON:  01/20/2015  FINDINGS: Skull and Sinuses:Nondepressed Right occipital bone fracture extending from the foramen magnum towards the lambdoid suture is stable from priors. No acute osseous findings.  Orbits: No acute finding.  Bilateral cataract resection.  Brain: No evidence of acute infarction, hemorrhage, hydrocephalus, or mass lesion/mass effect. ASPECTS is normal at 10.  Resolved inferior frontal lobe swelling, the site of contusion July 2016. No residual intracranial hemorrhage.  Generalized brain atrophy. Stable pattern of chronic small-vessel ischemic gliosis in the bilateral cerebral white matter.  Critical Value/emergent results were called by telephone at the time of interpretation on  02/14/2015 at 3:03 am to Dr. Aram Beecham , who verbally acknowledged these results.  IMPRESSION: 1. No acute finding. 2. Intracranial hemorrhage and right frontal lobe contusion seen July 2016 has resolved. Stable nondepressed right occipital bone fracture. 3. Chronic small vessel disease.   Electronically Signed   By: Monte Fantasia M.D.   On: 02/14/2015 03:06    EKG: Independently reviewed. Atrial flutter rate controlled.  Assessment/Plan Principal Problem:   Stroke Active Problems:   Chronic atrial fibrillation   1. Stroke - appreciate neurology consult. Patient is not a candidate for TPA given recent intracranial hemorrhage. Patient has stroke risk factors including atrial fibrillation and is not on anticoagulation secondary to recent bleed. MRI/MRA brain 2-D echo and carotid Doppler has been ordered. Check 2-D echo. Hemoglobin A1c and lipid  panel. Physical therapy consult. 2. Chronic atrial fibrillation/flutter - presently rate controlled and not on any rate limiting medications. Anticoagulation on hold secondary to recent bleed. 3. Recently admitted for subarachnoid and subdural hemorrhage.  I have reviewed patient's old charts and labs and neurologist consult and recommendations.   DVT Prophylaxis SCDs.  Code Status: DO NOT RESUSCITATE.  Family Communication: Discussed patient's husband.  Disposition Plan: Admit to inpatient.    Keyarra Rendall N. [facility] Pager [phone].  If 7PM-7AM, please contact night-coverage [email or web address] Password [email or web address] 02/14/2015, 5:50 AM

## 2015-02-14 NOTE — Consult Note (Addendum)
Referring Physician: ED    Chief Complaint: right arm monoparesis  HPI:                                                                                                                                         Caroline Myers is an 81 y.o. female with a past medical history significant for PAF off anticoagulation due to apixaban related [place] on 01/02/15, skin cancer of the nose, asthma, and GERD, brought in via EMS for evaluation of the above stated symptoms. Her husband is at the bedside. Patient stated that she went to bed around 11 pm and every thing was normal. [name] that she did not fall asleep immediately and then when she got up to go to the bathroom at 1 am noticed that she was not able to use the right hand due to weakness. No weakness of the right leg, face weakness, numbness, HA, unsteadiness, slurred speech, confusion, language or vision impairment. EMS was summoned. Upon arrival to the ED NIHSS 1. CT brain was independently reviewed and showed no acute abnormality. Presently, she complains of inability to use the right arm that is unchanged. Available serologies are unremarkable.  Date last known well: 02/13/15 Time last known well: 11 pm tPA Given: no, very recent apixaban related ICH on 01/02/15 NIHSS: 1 MRS: 0  Past Medical History  Diagnosis Date  . Allergy     allergic rhinitis  . Cancer     history of skin cancer of the nose  . Asthma 2000    mild. tried Advair after bronchospasm after exposure to cats  . Shoulder pain, left 05/2010    with rotator cuff tear--Dr Alfonso Ramus  . Paroxysmal atrial fibrillation     pt does not want anticoagulation  . Aortic stenosis     EF normal.  Mild aortic stenosis. Mild to moderate aortic regurgitation  . Anxiety   . Shortness of breath     occ  . GERD (gastroesophageal reflux disease)     occ  . Arthritis     Past Surgical History  Procedure Laterality Date  . Hernia repair  2003  . Acromioplasty  2000    right shoulder,  arthroscopic  . Lumbar laminectomy/decompression microdiscectomy  04/01/2012    Procedure: LUMBAR LAMINECTOMY/DECOMPRESSION MICRODISCECTOMY 1 LEVEL;  Surgeon: Eustace Moore, MD;  Location: [place] [facility];  Service: Neurosurgery;  Laterality: Right;  Right Lumbar four-five extraforaminal microdiskectomy     Family History  Problem Relation Age of Onset  . Heart attack Neg Hx   . Stroke Sister    Social History:  reports that she has never smoked. She has never used smokeless tobacco. She reports that she drinks alcohol. She reports that she does not use illicit drugs. Family history: no MS, epilepsy, brain tumor, or brain aneurysm Allergies:  Allergies  Allergen Reactions  . Tape Other (See Comments)  Rips skin    Medications:                                                                                                                           I have reviewed the patient's current medications.  ROS:                                                                                                                                       History obtained from husband, chart review and the patient  General ROS: negative for - chills, fatigue, fever, night sweats, weight gain or weight loss Psychological ROS: negative for - behavioral disorder, hallucinations, memory difficulties, mood swings or suicidal ideation Ophthalmic ROS: negative for - blurry vision, double vision, eye pain or loss of vision ENT ROS: negative for - epistaxis, nasal discharge, oral lesions, sore throat, tinnitus or vertigo Allergy and Immunology ROS: negative for - hives or itchy/watery eyes Hematological and Lymphatic ROS: negative for - bleeding problems, bruising or swollen lymph nodes Endocrine ROS: negative for - galactorrhea, hair pattern changes, polydipsia/polyuria or temperature intolerance Respiratory ROS: negative for - cough, hemoptysis, shortness of breath or wheezing Cardiovascular ROS: negative for -  chest pain, dyspnea on exertion, edema or irregular heartbeat Gastrointestinal ROS: negative for - abdominal pain, diarrhea, hematemesis, nausea/vomiting or stool incontinence Genito-Urinary ROS: negative for - dysuria, hematuria, incontinence or urinary frequency/urgency Musculoskeletal ROS: negative for - joint swelling Neurological ROS: as noted in HPI Dermatological ROS: negative for rash and skin lesion changes   Physical exam:  Constitutional: well developed, pleasant female in no apparent distress. Blood pressure 154/84, pulse 76, temperature 97.9 F (36.6 C), temperature source Oral, resp. rate 21, height 5\' 5"  (1.651 m), weight 64.411 kg (142 lb), SpO2 97 %. Eyes: no jaundice or exophthalmos.  Head: normocephalic. Neck: supple, no bruits, no JVD. Cardiac: no murmurs. Lungs: clear. Abdomen: soft, no tender, no mass. Extremities: no edema, clubbing, or cyanosis.  Skin: no rash Neurologic Examination:  General: Mental Status: Alert, oriented, thought content appropriate.  Speech fluent without evidence of aphasia.  Able to follow 3 step commands without difficulty. Cranial Nerves: II: Discs flat bilaterally; Visual fields grossly normal, pupils equal, round, reactive to light and accommodation III,IV, VI: ptosis not present, extra-ocular motions intact bilaterally V,VII: smile symmetric, facial light touch sensation normal bilaterally VIII: hearing normal bilaterally IX,X: uvula rises symmetrically XI: bilateral shoulder shrug XII: midline tongue extension without atrophy or fasciculations Motor: Significant for right UE monoparesis. Tone decreased in the right UE Sensory: Pinprick and light touch intact throughout, bilaterally Deep Tendon Reflexes:  1+ all over Plantars: Right: upgoing   Left: downgoing Cerebellar: normal finger-to-nose, normal heel-to-shin test. Gait:   No ataxia.    Results for orders placed or performed during the hospital encounter of 02/14/15 (from the past 48 hour(s))  I-stat troponin, ED (not at [facility], [facility])     Status: None   Collection Time: 02/14/15  2:58 AM  Result Value Ref Range   Troponin i, poc 0.02 0.00 - 0.08 ng/mL   Comment 3            Comment: Due to the release kinetics of cTnI, a negative result within the first hours of the onset of symptoms does not rule out myocardial infarction with certainty. If myocardial infarction is still suspected, repeat the test at appropriate intervals.   I-Stat Chem 8, ED  (not at [facility], [facility])     Status: Abnormal   Collection Time: 02/14/15  3:06 AM  Result Value Ref Range   Sodium 139 135 - 145 mmol/L   Potassium 3.9 3.5 - 5.1 mmol/L   Chloride 102 101 - 111 mmol/L   BUN 28 (H) 6 - 20 mg/dL   Creatinine, Ser 0.80 0.44 - 1.00 mg/dL   Glucose, Bld 89 65 - 99 mg/dL   Calcium, Ion 1.12 (L) 1.13 - 1.30 mmol/L   TCO2 27 0 - 100 mmol/L   Hemoglobin 15.0 12.0 - 15.0 g/dL   HCT 44.0 36.0 - 46.0 %   Ct Head Wo Contrast  02/14/2015   CLINICAL DATA:  Right hand weakness.  Code stroke.  EXAM: CT HEAD WITHOUT CONTRAST  TECHNIQUE: Contiguous axial images were obtained from the base of the skull through the vertex without intravenous contrast.  COMPARISON:  01/20/2015  FINDINGS: Skull and Sinuses:Nondepressed Right occipital bone fracture extending from the foramen magnum towards the lambdoid suture is stable from priors. No acute osseous findings.  Orbits: No acute finding.  Bilateral cataract resection.  Brain: No evidence of acute infarction, hemorrhage, hydrocephalus, or mass lesion/mass effect. ASPECTS is normal at 10.  Resolved inferior frontal lobe swelling, the site of contusion July 2016. No residual intracranial hemorrhage.  Generalized brain atrophy. Stable pattern of chronic small-vessel ischemic gliosis in the bilateral cerebral white matter.  Critical Value/emergent results were  called by telephone at the time of interpretation on 02/14/2015 at 3:03 am to Dr. Aram Beecham , who verbally acknowledged these results.  IMPRESSION: 1. No acute finding. 2. Intracranial hemorrhage and right frontal lobe contusion seen July 2016 has resolved. Stable nondepressed right occipital bone fracture. 3. Chronic small vessel disease.   Electronically Signed   By: Monte Fantasia M.D.   On: 02/14/2015 03:06     Assessment: 81 y.o. female with PAF off anticoagulation as the result of Apixaban related ICH on 01/02/15, brought in as a code stroke due to acute onset right UE monoparesis. Initial NIHSS 1. CT brain without  acute intracranial abnormality. Likely small acute cortical infarction involving distal left MCA branch in the context of known PAF off anticoagulation. Unfortunately, very recent Apixaban related ICH impedes administering IV tpa, and at this moment her neuro-exam is not suggestive of proximal large artery occlusion. Admit to medicine. Ordered complete stroke work up. It seems to be too soon to resume anticoagulation or antiplatelet therapy after Apixaban related ICH 6 weeks ago, but will let stroke attending to make a decision in this regard. Stroke team will resume care tomorrow.  Stroke Risk Factors - age ,PAF.  Plan: 1. HgbA1c, fasting lipid panel 2. MRI, MRA  of the brain without contrast 3. Echocardiogram 4. Carotid dopplers 5. Prophylactic therapy-deferred at this moment due to recent [place] 6. Risk factor modification 7. Telemetry monitoring 8. Frequent neuro checks 9. PT/OT SLP  Dorian Pod, MD [facility] [phone]  02/14/2015, 3:11 AM

## 2015-02-14 NOTE — Progress Notes (Signed)
[facility]  Patient Status: Active (receiving services up to time of hospitalization)  [facility] is providing the following services: RN, PT, OT, MSW and HHA  If patient discharges after hours, please call [phone].   Consepcion Hearing 02/14/2015, 3:26 PM

## 2015-02-14 NOTE — Evaluation (Signed)
Physical Therapy Evaluation Patient Details Name: Caroline Myers MRN: [id number] DOB: 04/21/1924 Today's Date: 02/14/2015   History of Present Illness  [name] is an 81 y.o. female with a past medical history significant for PAF off anticoagulation due to apixaban related Caroline Myers on 01/02/15, skin cancer of the nose, asthma, and GERD, brought in via EMS for evaluation of right UE weakness.  Clinical Impression  Patient presents with decreased right UE/LE strength affecting independence with ADL's and balance/safety with mobility.  She will benefit from skilled PT in the acute setting to allow return home with spouse assist and to resume HHPT.      Follow Up Recommendations Home health PT;Supervision/Assistance - 24 hour    Equipment Recommendations  None recommended by PT    Recommendations for Other Services       Precautions / Restrictions Precautions Precautions: Fall      Mobility  Bed Mobility Overal bed mobility: Needs Assistance Bed Mobility: Supine to Sit     Supine to sit: Min assist     General bed mobility comments: assist for balance coming up to sit  Transfers Overall transfer level: Needs assistance Equipment used: None   Sit to Stand: Min assist            Ambulation/Gait Ambulation/Gait assistance: Min assist Ambulation Distance (Feet): 40 Feet Assistive device: None       General Gait Details: heavy right LE and unsteadiness noted min assist for balance/safety  Stairs            Wheelchair Mobility    Modified Rankin (Stroke Patients Only) Modified Rankin (Stroke Patients Only) Pre-Morbid Rankin Score: No significant disability Modified Rankin: Moderately severe disability     Balance           Standing balance support: No upper extremity supported Standing balance-Leahy Scale: Fair Standing balance comment: static stance without UE assist, but needs UE support for dynamic activities                              Pertinent Vitals/Pain Pain Assessment: No/denies pain    Home Living Family/patient expects to be discharged to:: Private residence Living Arrangements: Spouse/significant other Available Help at Discharge: Family;Available 24 hours/day Type of Home: House Home Access: Stairs to enter Entrance Stairs-Rails: None Entrance Stairs-Number of Steps: 1 brick step Home Layout: One level        Prior Function Level of Independence: Independent (reports recently graduated from walker)         Comments: Pt reports falls ~1x/year      Hand Dominance   Dominant Hand: Right    Extremity/Trunk Assessment     RUE Deficits / Details: AROM grossly WFL, strength 4/5 decreased coordination fingers to thumb           RLE Deficits / Details: AROM WFL, strength hip flexion 4-/5, knee extension and flexion 4+/5, ankle DF 4+/5       Communication   Communication: No difficulties  Cognition Arousal/Alertness: Awake/alert Behavior During Therapy: WFL for tasks assessed/performed Overall Cognitive Status: Within Functional Limits for tasks assessed                      General Comments      Exercises        Assessment/Plan    PT Assessment Patient needs continued PT services  PT Diagnosis Abnormality of gait;Generalized weakness   PT Problem List  Decreased strength;Decreased balance;Decreased mobility;Decreased knowledge of use of DME;Decreased safety awareness  PT Treatment Interventions DME instruction;Functional mobility training;Balance training;Patient/family education;Gait training;Therapeutic activities;Therapeutic exercise;Neuromuscular re-education   PT Goals (Current goals can be found in the Care Plan section) Acute Rehab PT Goals Patient Stated Goal: to go home  PT Goal Formulation: With patient/family Time For Goal Achievement: 02/21/15 Potential to Achieve Goals: Good    Frequency Min 4X/week   Barriers to discharge         Co-evaluation               End of Session   Activity Tolerance: Patient tolerated treatment well Patient left: in chair;with call bell/phone within reach;with family/visitor present           Time: 0850-0909 PT Time Calculation (min) (ACUTE ONLY): 19 min   Charges:   PT Evaluation $Initial PT Evaluation Tier I: 1 Procedure     PT G Codes:        WYNN,CYNDI 02-27-2015, 3:43 PM  Magda Kiel, Rankin 02-27-2015

## 2015-02-14 NOTE — ED Provider Notes (Signed)
CSN: [id number]     Arrival date & time 02/14/15  0231 History  This chart was scribed for Teria Khachatryan, MD by Hansel Feinstein, ED Scribe. This patient was seen in room D31C/D31C and the patient's care was started at 2:33 AM.       Chief Complaint  Patient presents with  . Code Stroke   Patient is a 81 y.o. female presenting with Acute Neurological Problem. The history is provided by the patient. No language interpreter was used.  Cerebrovascular Accident This is a new problem. The current episode started 1 to 2 hours ago. Episode frequency: New. The problem has not changed since onset.Associated symptoms comments: No facial droop, no slurred speech. Nothing aggravates the symptoms. Nothing relieves the symptoms. She has tried nothing for the symptoms. The treatment provided no relief.   HPI Comments: Caroline Myers is a 81 y.o. female BIBA with hx of cancer, asthma, paroxysmal Afib, aortic stenosis, anxiety, GERD, arthritis who presents to the Emergency Department complaining of moderate right upper extremity weakness onset one hour PTA. Per EMS, there was no facial droop, no slurred speech, no complaints of pain. Last seen normal at 2300. She woke her husband at 0100 with her symptoms. CGB 114 on transport.   Past Medical History  Diagnosis Date  . Allergy     allergic rhinitis  . Cancer     history of skin cancer of the nose  . Asthma 2000    mild. tried Advair after bronchospasm after exposure to cats  . Shoulder pain, left 05/2010    with rotator cuff tear--Dr Alfonso Ramus  . Paroxysmal atrial fibrillation     pt does not want anticoagulation  . Aortic stenosis     EF normal.  Mild aortic stenosis. Mild to moderate aortic regurgitation  . Anxiety   . Shortness of breath     occ  . GERD (gastroesophageal reflux disease)     occ  . Arthritis    Past Surgical History  Procedure Laterality Date  . Hernia repair  2003  . Acromioplasty  2000    right shoulder, arthroscopic  . Lumbar  laminectomy/decompression microdiscectomy  04/01/2012    Procedure: LUMBAR LAMINECTOMY/DECOMPRESSION MICRODISCECTOMY 1 LEVEL;  Surgeon: Eustace Moore, MD;  Location: [facility];  Service: Neurosurgery;  Laterality: Right;  Right Lumbar four-five extraforaminal microdiskectomy    Family History  Problem Relation Age of Onset  . Heart attack Neg Hx   . Stroke Sister    Social History  Substance Use Topics  . Smoking status: Never Smoker   . Smokeless tobacco: Never Used     Comment: occ wine  . Alcohol Use: 0.0 oz/week    0 Standard drinks or equivalent per week     Comment: OCC   OB History    No data available     Review of Systems  Neurological: Positive for weakness ( right arm) and numbness. Negative for facial asymmetry.  All other systems reviewed and are negative.  Allergies  Tape  Home Medications   Prior to Admission medications   Medication Sig Start Date End Date Taking? Authorizing Provider  acetaminophen (TYLENOL) 325 MG tablet Take 2 tablets (650 mg total) by mouth every 6 (six) hours as needed for mild pain (or Fever >/= 101). 01/05/15   Belkys A Regalado, MD  albuterol (PROVENTIL HFA;VENTOLIN HFA) 108 (90 BASE) MCG/ACT inhaler Inhale 1-2 puffs into the lungs every 6 (six) hours as needed for wheezing or shortness of  breath.    Historical Provider, MD  fluticasone (FLONASE) 50 MCG/ACT nasal spray Place 2 sprays into both nostrils 2 (two) times daily. 12/26/14   Historical Provider, MD  sertraline (ZOLOFT) 50 MG tablet Take 50 mg by mouth daily. 11/24/14   Historical Provider, MD   There were no vitals taken for this visit. Physical Exam  Constitutional: She is oriented to person, place, and time. She appears well-developed and well-nourished.  HENT:  Head: Normocephalic and atraumatic.  Mouth/Throat: Oropharynx is clear and moist. No oropharyngeal exudate.  Eyes: Conjunctivae and EOM are normal. Pupils are equal, round, and reactive to light.  Neck: Normal  range of motion. Neck supple.  Cardiovascular: Normal rate and normal heart sounds.  An irregularly irregular rhythm present.  Pulmonary/Chest: Effort normal and breath sounds normal. No respiratory distress. She has no wheezes. She has no rales.  Airway intact. Lungs CTA.   Abdominal: Soft. Bowel sounds are normal. She exhibits no distension. There is no tenderness. There is no rebound.  Musculoskeletal: Normal range of motion.  Neurological: She is alert and oriented to person, place, and time. She has normal reflexes. No cranial nerve deficit. She exhibits normal muscle tone.  Down going babinski bilaterally.    Skin: Skin is warm and dry.  Psychiatric: She has a normal mood and affect. Her behavior is normal.  Nursing note and vitals reviewed.  ED Course  Procedures (including critical care time) DIAGNOSTIC STUDIES:  COORDINATION OF CARE: 2:35 AM Discussed treatment plan with pt at bedside and pt agreed to plan.   Labs Review Labs Reviewed - No data to display  Imaging Review No results found. I, Olusegun Gerstenberger, MD , personally reviewed and evaluated these images and lab results as part of my medical decision-making.   EKG Interpretation None      MDM   Final diagnoses:  None    EKG Interpretation  Date/Time:  Tuesday February 14 2015 02:59:17 EDT Ventricular Rate:  74 PR Interval:    QRS Duration: 91 QT Interval:  464 QTC Calculation: 515 R Axis:   -61 Text Interpretation:  Atrial fibrillation Left anterior fascicular block Anteroseptal infarct, old Prolonged QT interval Confirmed by [facility]  MD, Aerilynn Goin ([id number]) on 02/14/2015 4:01:45 AM      Admit for CVA workup inpatient tele per Dr. Hal Hope  I personally performed the services described in this documentation, which was scribed in my presence. The recorded information has been reviewed and is accurate.   Veatrice Kells, MD 02/14/15 [id number]

## 2015-02-14 NOTE — Progress Notes (Signed)
STROKE TEAM PROGRESS NOTE   HISTORY Caroline Myers is an 81 y.o. female with a past medical history significant for PAF off anticoagulation due to apixaban related [place] on 01/02/15, skin cancer of the nose, asthma, and GERD, brought in via EMS for evaluation of right arm monoparesis. Her husband is at the bedside. Patient stated that she went to bed around 11 pm 02/13/2015 (LKW) and every thing was normal. [name] that she did not fall asleep immediately and then when she got up to go to the bathroom at 1 am noticed that she was not able to use the right hand due to weakness. No weakness of the right leg, face weakness, numbness, HA, unsteadiness, slurred speech, confusion, language or vision impairment. EMS was summoned. Upon arrival to the ED NIHSS 1. CT brain showed no acute abnormality. She complains of inability to use the right arm that is unchanged. Available serologies are unremarkable. NIHSS: 1  MRS: 0  Patient was not administered TPA secondary to very recent apixaban related ICH on 01/02/15. She was admitted for further evaluation and treatment.   SUBJECTIVE (INTERVAL HISTORY) Her husband and daughter are at the bedside.  Overall she feels her condition is stable. She was on Eliquis for atrial fibrillation. However she suffered a fall in July causing occipital skull fracture with bilateral frontal traumatic ICH, SDH and SAH. Eliquis was discontinued. She was told by Dr. Sherwood Gambler to discuss with her cardiologist Dr.Varanasi before restarting Eliquis.  OBJECTIVE Temp:  [97.8 F (36.6 C)-98.1 F (36.7 C)] 98.1 F (36.7 C) (08/16 1230) Pulse Rate:  [70-82] 79 (08/16 1230) Cardiac Rhythm:  [-] Atrial fibrillation (08/16 0827) Resp:  [17-24] 18 (08/16 1230) BP: (139-160)/(78-129) 151/129 mmHg (08/16 1230) SpO2:  [94 %-99 %] 99 % (08/16 1230) Weight:  [64.365 kg (141 lb 14.4 oz)-64.411 kg (142 lb)] 64.365 kg (141 lb 14.4 oz) (08/16 0800)  No results for input(s): GLUCAP in the last 168  hours.  Recent Labs Lab 02/14/15 0252 02/14/15 0306 02/14/15 1015  NA 138 139 140  K 4.0 3.9 3.7  CL 102 102 105  CO2 27  --  26  GLUCOSE 91 89 128*  BUN 22* 28* 17  CREATININE 0.85 0.80 0.77  CALCIUM 8.9  --  8.9    Recent Labs Lab 02/14/15 0252 02/14/15 1015  AST 26 30  ALT 15 15  ALKPHOS 81 84  BILITOT 0.5 0.7  PROT 6.7 6.8  ALBUMIN 3.3* 3.3*    Recent Labs Lab 02/14/15 0252 02/14/15 0306  WBC 6.2  --   NEUTROABS 3.5  --   HGB 13.5 15.0  HCT 41.7 44.0  MCV 100.5*  --   PLT 188  --    No results for input(s): CKTOTAL, CKMB, CKMBINDEX, TROPONINI in the last 168 hours.  Recent Labs  02/14/15 0252  LABPROT 14.4  INR 1.10    Recent Labs  02/14/15 0435  COLORURINE YELLOW  LABSPEC 1.009  PHURINE 6.5  GLUCOSEU NEGATIVE  HGBUR NEGATIVE  BILIRUBINUR NEGATIVE  KETONESUR NEGATIVE  PROTEINUR NEGATIVE  UROBILINOGEN 0.2  NITRITE NEGATIVE  LEUKOCYTESUR SMALL*       Component Value Date/Time   CHOL 191 07/27/2013 0650   TRIG 104 07/27/2013 0650   HDL 51 07/27/2013 0650   CHOLHDL 3.7 07/27/2013 0650   VLDL 21 07/27/2013 0650   LDLCALC 119* 07/27/2013 0650   Lab Results  Component Value Date   HGBA1C 5.6 07/27/2013      Component Value Date/Time  LABOPIA NONE DETECTED 02/14/2015 0435   COCAINSCRNUR NONE DETECTED 02/14/2015 0435   LABBENZ NONE DETECTED 02/14/2015 0435   AMPHETMU NONE DETECTED 02/14/2015 0435   THCU NONE DETECTED 02/14/2015 0435   LABBARB NONE DETECTED 02/14/2015 0435     Recent Labs Lab 02/14/15 0253  ETH <5    IMAGING  Dg Chest 2 View 02/14/2015   No active disease.     Ct Head Wo Contrast 02/14/2015   1. No acute finding. 2. Intracranial hemorrhage and right frontal lobe contusion seen July 2016 has resolved. Stable nondepressed right occipital bone fracture. 3. Chronic small vessel disease.     MRI HEAD 02/14/2015    Acute 17 x 12 mm acute infarct LEFT posterior frontal lobe, MCA territory.  LEFT inferior frontal  lobe encephalomalacia at site of prior trauma. Small LEFT chronic subdural hematomas versus hygromas.  Moderate to severe white matter changes compatible with chronic small vessel ischemic disease. Old small bilateral cerebellar infarcts.    MRA HEAD 02/14/2015    No acute large vessel occlusion or high-grade stenosis.  Moderate stenosis RIGHT P1 segment, mild stenosis RIGHT M1 segment.     2-D echo pending  Carotid Doppler pending  Physical exam  Temp:  [97.8 F (36.6 C)-98.4 F (36.9 C)] 98.4 F (36.9 C) (08/16 1825) Pulse Rate:  [70-82] 73 (08/16 1825) Resp:  [16-24] 16 (08/16 1825) BP: (113-160)/(78-129) 142/84 mmHg (08/16 1825) SpO2:  [94 %-99 %] 94 % (08/16 1825) Weight:  [141 lb 14.4 oz (64.365 kg)-142 lb (64.411 kg)] 141 lb 14.4 oz (64.365 kg) (08/16 0800)  General - Well nourished, well developed, in no apparent distress.  Ophthalmologic - Sharp disc margins OU.  Cardiovascular - irregularly irregular heart rate and rhythm  Mental Status -  Level of arousal and orientation to time, place, and person were intact. Language including expression, naming, repetition, comprehension was assessed and found intact. Fund of Knowledge was assessed and was intact.  Cranial Nerves II - XII - II - Visual field intact OU. III, IV, VI - Extraocular movements intact. V - Facial sensation intact bilaterally. VII - Facial movement intact bilaterally. VIII - Hearing & vestibular intact bilaterally. X - Palate elevates symmetrically. XI - Chin turning & shoulder shrug intact bilaterally. XII - Tongue protrusion intact.  Motor Strength - The patient's strength was normal in all extremities except left hand grip 3+/5 and dexterity difficulty and pronator drift was absent.  Bulk was normal and fasciculations were absent.   Motor Tone - Muscle tone was assessed at the neck and appendages and was normal.  Reflexes - The patient's reflexes were 1+ in all extremities and she had no  pathological reflexes.  Sensory - Light touch, temperature/pinprick were assessed and were symmetrical.    Coordination - The patient had normal movements in the hands and feet with no ataxia or dysmetria.  Tremor was absent.  Gait and Station - deferred due to safety concerns.   ASSESSMENT/PLAN Caroline Myers is a 81 y.o. female with history of PAF off anticoagulation due to apixaban related ICH on 01/02/15, skin cancer of the nose, asthma, and GERD presenting with right arm monoparesis. She did not receive IV t-PA due to very recent apixaban related ICH on 01/02/15.   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 L frontal encephalomalacia. L chronic SDH vs hygroma. Old small bilateral cerebellar infarcts  MRA  No large vessel occlusion. Moderate R P1 and mild R M1 stenosis  CT head showed resolution of previous traumatic ICH, SDH and SAH  Carotid Doppler  pending   2D Echo  pending   LDL pending   HgbA1c pending  SCDs for VTE prophylaxis Diet Heart Room service appropriate?: Yes; Fluid consistency:: Thin  no antithrombotic prior to admission due to recent ICH. No antithrombotics now due to [place]. CT had showed resolution of previous bleeding, due to risk of embolic stroke, we recommend resume Eliquis. Discussed with Dr. Eliseo Squires and she will contact Dr. Addison Bailey about resuming Eliquis.  Ongoing aggressive stroke risk factor management  Therapy recommendations:  pending   Disposition:  pending   Paroxysmal Atrial Fibrillation  Home anticoagulation:  None due to recent ICH on apixaban  CHA2DS2-VASc Score = 5, ?2 oral anticoagulation recommended  Age in Years:  ?34   +2    Sex:  Female   Female   +1    Hypertension History:  0   Diabetes Mellitus:  0   Congestive Heart Failure History:  0  Vascular Disease History:  0  Stroke/TIA/Thromboembolism History:  yes    +2  Dr. Sherwood Gambler recommended to hold anticoagulation until evaluated by Dr. Irish Lack but her appt is not until September. Recommend Dr. Eliseo Squires to discuss with Dr. Irish Lack. Stroke recommends resumption of eliquis at this time.    Hx of hyperlipidemia  Home meds:  No statin  LDL pending, goal < 70  Other Stroke Risk Factors  Advanced age  ETOH use  Hx stroke/TIA - Jan 2015 cerebellar infarct embolic d/t atrial fibrillation, July 2016 traumatic ICH, SAH and SDH d/t fall  Family hx stroke (sister)  Hospital day # [place] for Pager information 02/14/2015 12:38 PM   I, the attending vascular neurologist, have personally obtained a history, examined the patient, evaluated laboratory data, individually viewed imaging studies and agree with radiology interpretations. I obtained additional history from pt's daughter at bedside. I also discussed with patient and her daughter and Dr. Eliseo Squires regarding her care plan. Together with the NP/PA, we formulated the assessment and plan of care which reflects our mutual decision.  I have made any additions or clarifications directly to the above note and agree with the findings and plan as currently documented.   81 year old female with history of A. fib on Eliquis in the past but discontinued due to traumatic ICH in July was admitted for left frontal small infarct. Stroke etiology consistent with atrial probation not on anticoagulation. Due to resolution of ICH on CT scan, and high risk of recurrent embolic stroke, we recommend resumption of Eliquis. Discussed with Dr. Eliseo Squires and she is going to discuss with Dr. Irish Lack before resuming Eliquis. Pending 2-D echo and carotid Doppler, LDL and A1c.  Rosalin Hawking, MD PhD Stroke Neurology 02/14/2015 6:53 PM       To contact Stroke Continuity provider, please refer to [email or web address] After hours, contact General Neurology

## 2015-02-14 NOTE — Progress Notes (Signed)
Patient admitted after midnight- please see H&P.   1. Stroke - appreciate neurology consult. Patient is not a candidate for TPA given recent intracranial hemorrhage. Patient has stroke risk factors including atrial fibrillation and is not on anticoagulation secondary to recent bleed. MRI/MRA brain + for CVA 2-D echo and carotid Doppler has been ordered. Hemoglobin A1c and lipid panel. Physical therapy consult. 2. Chronic atrial fibrillation/flutter - presently rate controlled and not on any rate limiting medications. Anticoagulation on hold secondary to recent bleed. 3. Recently admitted for subarachnoid and subdural hemorrhage.  Eulogio Bear DO

## 2015-02-14 NOTE — ED Notes (Signed)
Patient transported to MRI 

## 2015-02-15 ENCOUNTER — Inpatient Hospital Stay (HOSPITAL_COMMUNITY): Payer: Medicare Other

## 2015-02-15 DIAGNOSIS — I63412 Cerebral infarction due to embolism of left middle cerebral artery: Secondary | ICD-10-CM

## 2015-02-15 LAB — LIPID PANEL
Cholesterol: 199 mg/dL (ref 0–200)
HDL: 42 mg/dL (ref >40)
LDL CALC: 137 mg/dL — AB (ref 0–99)
Total CHOL/HDL Ratio: 4.7 RATIO
Triglycerides: 101 mg/dL (ref <150)
VLDL: 20 mg/dL (ref 0–40)

## 2015-02-15 LAB — HEMOGLOBIN A1C
Hgb A1c MFr Bld: 6 % — ABNORMAL HIGH (ref 4.8–5.6)
Mean Plasma Glucose: 126 mg/dL

## 2015-02-15 MED ORDER — SIMVASTATIN 20 MG PO TABS: 20.0000 mg | ORAL_TABLET | Freq: Every day | ORAL | Status: DC

## 2015-02-15 MED ORDER — APIXABAN 5 MG PO TABS: 5.0000 mg | ORAL_TABLET | Freq: Two times a day (BID) | ORAL | Status: DC

## 2015-02-15 MED ORDER — APIXABAN 5 MG PO TABS
5.0000 mg | ORAL_TABLET | Freq: Two times a day (BID) | ORAL | Status: DC
  Administered 2015-02-15: 5 mg via ORAL
  Filled 2015-02-15: qty 1

## 2015-02-15 NOTE — Progress Notes (Signed)
ANTICOAGULATION CONSULT NOTE - Initial Consult  Pharmacy Consult for apixaban Indication: atrial fibrillation  Allergies  Allergen Reactions  . Tape Other (See Comments)    Rips skin    Patient Measurements: Height: 5\' 5"  (165.1 cm) Weight: 141 lb 14.4 oz (64.365 kg) IBW/kg (Calculated) : 57  Vital Signs: Temp: 97.8 F (36.6 C) (08/17 1022) Temp Source: Oral (08/17 1022) BP: 116/58 mmHg (08/17 1022) Pulse Rate: 81 (08/17 1022)  Labs:  Recent Labs  02/14/15 0252 02/14/15 0306 02/14/15 1015  HGB 13.5 15.0  --   HCT 41.7 44.0  --   PLT 188  --   --   APTT 31  --   --   LABPROT 14.4  --   --   INR 1.10  --   --   CREATININE 0.85 0.80 0.77    Estimated Creatinine Clearance: 43.7 mL/min (by C-G formula based on Cr of 0.77).   Medical History: Past Medical History  Diagnosis Date  . Allergy     allergic rhinitis  . Cancer     history of skin cancer of the nose  . Asthma 2000    mild. tried Advair after bronchospasm after exposure to cats  . Shoulder pain, left 05/2010    with rotator cuff tear--Dr Alfonso Ramus  . Paroxysmal atrial fibrillation     pt does not want anticoagulation  . Aortic stenosis     EF normal.  Mild aortic stenosis. Mild to moderate aortic regurgitation  . Anxiety   . Shortness of breath     occ  . GERD (gastroesophageal reflux disease)     occ  . Arthritis     Medications:  Prescriptions prior to admission  Medication Sig Dispense Refill Last Dose  . sertraline (ZOLOFT) 50 MG tablet Take 50 mg by mouth daily.  4 02/13/2015 at Unknown time  . acetaminophen (TYLENOL) 325 MG tablet Take 2 tablets (650 mg total) by mouth every 6 (six) hours as needed for mild pain (or Fever >/= 101). (Patient not taking: Reported on 02/14/2015) 30 tablet 0 Not Taking at Unknown time    Assessment: 45 y F with PAF off anticoagulation due to apixaban related ICH on 01/12/15.  Had a fall in July causing occipital skull fracture with B frontal traumatic ICH, SDH  and SAH.  CT showed resolution of previous bleeding.  CHADSVAS = 5.  Age 81, wt 64.4 kg, creat 0.77.    Goal of Therapy:  Stroke prevention   Plan:  apixaban 5 mg po bid  Eudelia Bunch, Pharm.D. [phone] 02/15/2015 11:43 AM

## 2015-02-15 NOTE — Progress Notes (Signed)
Physical Therapy Treatment Patient Details Name: Caroline Myers MRN: [id number] DOB: [birth year] Today's Date: 02/15/2015    History of Present Illness Caroline Myers is an 81 y.o. female with a past medical history significant for PAF off anticoagulation due to apixaban related [place] on 01/02/15, skin cancer of the nose, asthma, and GERD, brought in via EMS for evaluation of right UE weakness.    PT Comments    Pt is progressing towards PT goals. Pt able to ambulate 200 ft without an AD however is safer with a RW due to mild impulsiveness, L and R drifts during gt, and difficulty with turns. Pt has a DGI score of 14. With 24 hour supervision and a RW pt DC destination is appropriate to improve of functional mobility.  Follow Up Recommendations  Home health PT;Supervision/Assistance - 24 hour     Equipment Recommendations  None recommended by PT    Recommendations for Other Services       Precautions / Restrictions Precautions Precautions: Fall Restrictions Weight Bearing Restrictions: No    Mobility  Bed Mobility               General bed mobility comments: pt sitting in chair at start of therapy session  Transfers Overall transfer level: Needs assistance Equipment used: None Transfers: Sit to/from Stand Sit to Stand: Min guard         General transfer comment: mildly impulsive, cue to push up from chair  Ambulation/Gait Ambulation/Gait assistance: Min assist Ambulation Distance (Feet): 200 Feet Assistive device: None;Rolling walker (2 wheeled) Gait Pattern/deviations: Step-through pattern;Drifts right/left;Wide base of support Gait velocity: normal Gait velocity interpretation: at or above normal speed for age/gender General Gait Details: pt with quick implsive speed, demo mild L and R drifts without AD , pt still mildly unsteady with RW  requiring cues to slow down and stay in close proximity to RW,   Stairs Stairs: Yes Stairs assistance: Min  guard Stair Management: One rail Right;Forwards;Alternating pattern Number of Stairs: 2 General stair comments: cues for slower speed and to hold hand railing  Wheelchair Mobility    Modified Rankin (Stroke Patients Only) Modified Rankin (Stroke Patients Only) Pre-Morbid Rankin Score: No significant disability Modified Rankin: Moderately severe disability     Balance Overall balance assessment: Needs assistance Sitting-balance support: No upper extremity supported;Feet supported Sitting balance-Leahy Scale: Good     Standing balance support: No upper extremity supported;During functional activity Standing balance-Leahy Scale: Fair Standing balance comment: able to static stand without AD however with slight impulsiveness and weakness pt requires RW for safety                    Cognition Arousal/Alertness: Awake/alert Behavior During Therapy: WFL for tasks assessed/performed Overall Cognitive Status: Within Functional Limits for tasks assessed                      Exercises      General Comments        Pertinent Vitals/Pain Pain Assessment: No/denies pain    Home Living                      Prior Function            PT Goals (current goals can now be found in the care plan section) Acute Rehab PT Goals Patient Stated Goal: to go home  Progress towards PT goals: Progressing toward goals    Frequency  Min 4X/week  PT Plan Current plan remains appropriate    Co-evaluation             End of Session Equipment Utilized During Treatment: Gait belt Activity Tolerance: Patient tolerated treatment well Patient left: in chair;with call bell/phone within reach     Time: 1134-1202 PT Time Calculation (min) (ACUTE ONLY): 28 min  Charges:                       G Codes:      Renaee Munda 02/24/2015, 2:27 PM   Lequita Halt (student physical therapy assistant) Acute Rehab [phone]

## 2015-02-15 NOTE — Care Management Note (Signed)
Case Management Note  Patient Details  Name: Caroline Myers MRN: [id number] Date of Birth: [birth year]  Subjective/Objective:                    Action/Plan: Patient was active with [facility] at time of admission.  [name] with [facility] was notified of resumption orders.  Expected Discharge Date:                  Expected Discharge Plan:  [place]  In-House Referral:     Discharge planning Services  CM Consult  Post Acute Care Choice:  Home Health Choice offered to:  Patient  DME Arranged:    DME Agency:     HH Arranged:  RN, PT, OT, Nurse's Aide (CSW) [place] Agency:  [place]  Status of Service:  Completed, signed off  Medicare Important Message Given:    Date Medicare IM Given:    Medicare IM give by:    Date Additional Medicare IM Given:    Additional Medicare Important Message give by:     If discussed at [place] of Stay Meetings, dates discussed:    Additional Comments:  Rolm Baptise, RN 02/15/2015, 1:59 PM

## 2015-02-15 NOTE — Evaluation (Signed)
Occupational Therapy Evaluation Patient Details Name: Caroline Myers MRN: [id number] DOB: [birth year] Today's Date: 02/15/2015    History of Present Illness Caroline Myers is an 81 y.o. female with a past medical history significant for PAF off anticoagulation due to apixaban related [place] on 01/02/15, skin cancer of the nose, asthma, and GERD, brought in via EMS for evaluation of right UE weakness.   Clinical Impression   PT admitted with R Ue weakness. Pt currently with functional limitiations due to the deficits listed below (see OT problem list). PTA from home MOD I level for adls. Pt will benefit from skilled OT to increase their independence and safety with adls and balance to allow discharge [place].     Follow Up Recommendations  Home health OT    Equipment Recommendations  None recommended by OT    Recommendations for Other Services       Precautions / Restrictions Precautions Precautions: Fall Restrictions Weight Bearing Restrictions: No      Mobility Bed Mobility               General bed mobility comments: in chair on arrival  Transfers Overall transfer level: Needs assistance Equipment used: None Transfers: Sit to/from Stand Sit to Stand: Supervision         General transfer comment: mildly impulsive, cue to push up from chair    Balance Overall balance assessment: Needs assistance Sitting-balance support: No upper extremity supported;Feet supported Sitting balance-Leahy Scale: Good     Standing balance support: No upper extremity supported;During functional activity Standing balance-Leahy Scale: Fair Standing balance comment: able to static stand without AD however with slight impulsiveness and weakness pt requires RW for safety                 Standardized Balance Assessment Standardized Balance Assessment : Dynamic Gait Index   Dynamic Gait Index Level Surface: Mild Impairment Change in Gait Speed: Mild Impairment Gait with  Horizontal Head Turns: Mild Impairment Gait with Vertical Head Turns: Moderate Impairment Gait and Pivot Turn: Moderate Impairment Step Over Obstacle: Mild Impairment Step Around Obstacles: Mild Impairment Steps: Mild Impairment Total Score: 14      ADL Overall ADL's : Needs assistance/impaired     Grooming: Brushing hair;Supervision/safety;Sitting           Upper Body Dressing : Set up;Sitting   Lower Body Dressing: Set up;Sit to/from stand   Toilet Transfer: Supervision/safety             General ADL Comments: Pt educated on sitting for all adls to prevent falls. Pt educated daughter to help with shower due to fall risk.     Vision Vision Assessment?: No apparent visual deficits   Perception     Praxis      Pertinent Vitals/Pain Pain Assessment: No/denies pain     Hand Dominance Right   Extremity/Trunk Assessment Upper Extremity Assessment Upper Extremity Assessment: Overall WFL for tasks assessed   Lower Extremity Assessment Lower Extremity Assessment: Defer to PT evaluation   Cervical / Trunk Assessment Cervical / Trunk Assessment: Normal   Communication Communication Communication: No difficulties   Cognition Arousal/Alertness: Awake/alert Behavior During Therapy: WFL for tasks assessed/performed Overall Cognitive Status: Within Functional Limits for tasks assessed                     General Comments       Exercises       Shoulder Instructions      Home Living Family/patient  expects to be discharged to:: Private residence Living Arrangements: Spouse/significant other Available Help at Discharge: Family;Available 24 hours/day Type of Home: House Home Access: Stairs to enter [employer] of Steps: 1 brick step Entrance Stairs-Rails: None Home Layout: One level     Bathroom Shower/Tub: Tub/shower unit Shower/tub characteristics: Curtain Biochemist, clinical: Standard     Home Equipment: Environmental consultant - 2 wheels;Cane -  single point   Additional Comments: pt ambulates without assistive device      Prior Functioning/Environment Level of Independence: Independent        Comments: Pt reports falls ~1x/year     OT Diagnosis: Generalized weakness   OT Problem List: Decreased strength;Decreased activity tolerance;Impaired balance (sitting and/or standing);Decreased safety awareness;Decreased knowledge of use of DME or AE;Decreased knowledge of precautions   OT Treatment/Interventions: Self-care/ADL training;DME and/or AE instruction;Therapeutic activities;Patient/family education;Balance training;Therapeutic exercise    OT Goals(Current goals can be found in the care plan section) Acute Rehab OT Goals Patient Stated Goal: to go home  OT Goal Formulation: With patient Time For Goal Achievement: 03/01/15 Potential to Achieve Goals: Good  OT Frequency: Min 2X/week   Barriers to D/C:            Co-evaluation              End of Session Nurse Communication: Mobility status;Precautions  Activity Tolerance: Patient tolerated treatment well Patient left: in chair;with call bell/phone within reach;with family/visitor present   Time: 1359-1418 OT Time Calculation (min): 19 min Charges:  OT General Charges $OT Visit: 1 Procedure OT Evaluation $Initial OT Evaluation Tier I: 1 Procedure G-Codes:    Peri Maris 03-01-15, 3:00 PM  Jeri Modena   OTR/L Pager: [phone] Office: [phone] .

## 2015-02-15 NOTE — Progress Notes (Signed)
STROKE TEAM PROGRESS NOTE   HISTORY Caroline Myers is an 81 y.o. female with a past medical history significant for PAF off anticoagulation due to apixaban related [place] on 01/02/15, skin cancer of the nose, asthma, and GERD, brought in via EMS for evaluation of right arm monoparesis. Her husband is at the bedside. Patient stated that she went to bed around 11 pm 02/13/2015 ([facility]) and every thing was normal. Caroline Myers that she did not fall asleep immediately and then when she got up to go to the bathroom at 1 am noticed that she was not able to use the right hand due to weakness. No weakness of the right leg, face weakness, numbness, HA, unsteadiness, slurred speech, confusion, language or vision impairment. EMS was summoned. Upon arrival to the ED NIHSS 1. CT brain showed no acute abnormality. She complains of inability to use the right arm that is unchanged. Available serologies are unremarkable. NIHSS: 1  MRS: 0  Patient was not administered TPA secondary to very recent apixaban related ICH on 01/02/15. She was admitted for further evaluation and treatment.   SUBJECTIVE (INTERVAL HISTORY) Her husband and daughter are at the bedside.  They report Dr. Irish Lack is agreeable to starting eliquis.  OBJECTIVE Temp:  [97.6 F (36.4 C)-98.4 F (36.9 C)] 97.8 F (36.6 C) (08/17 1022) Pulse Rate:  [70-81] 81 (08/17 1022) Cardiac Rhythm:  [-] Atrial fibrillation (08/17 0800) Resp:  [16-18] 18 (08/17 1022) BP: (113-155)/(58-97) 116/58 mmHg (08/17 1022) SpO2:  [94 %-96 %] 96 % (08/17 1022)  No results for input(s): GLUCAP in the last 168 hours.  Recent Labs Lab 02/14/15 0252 02/14/15 0306 02/14/15 1015  NA 138 139 140  K 4.0 3.9 3.7  CL 102 102 105  CO2 27  --  26  GLUCOSE 91 89 128*  BUN 22* 28* 17  CREATININE 0.85 0.80 0.77  CALCIUM 8.9  --  8.9    Recent Labs Lab 02/14/15 0252 02/14/15 1015  AST 26 30  ALT 15 15  ALKPHOS 81 84  BILITOT 0.5 0.7  PROT 6.7 6.8  ALBUMIN 3.3* 3.3*     Recent Labs Lab 02/14/15 0252 02/14/15 0306  WBC 6.2  --   NEUTROABS 3.5  --   HGB 13.5 15.0  HCT 41.7 44.0  MCV 100.5*  --   PLT 188  --    No results for input(s): CKTOTAL, CKMB, CKMBINDEX, TROPONINI in the last 168 hours.  Recent Labs  02/14/15 0252  LABPROT 14.4  INR 1.10    Recent Labs  02/14/15 0435  COLORURINE YELLOW  LABSPEC 1.009  PHURINE 6.5  GLUCOSEU NEGATIVE  HGBUR NEGATIVE  BILIRUBINUR NEGATIVE  KETONESUR NEGATIVE  PROTEINUR NEGATIVE  UROBILINOGEN 0.2  NITRITE NEGATIVE  LEUKOCYTESUR SMALL*       Component Value Date/Time   CHOL 199 02/15/2015 0054   TRIG 101 02/15/2015 0054   HDL 42 02/15/2015 0054   CHOLHDL 4.7 02/15/2015 0054   VLDL 20 02/15/2015 0054   LDLCALC 137* 02/15/2015 0054   Lab Results  Component Value Date   HGBA1C 6.0* 02/14/2015      Component Value Date/Time   LABOPIA NONE DETECTED 02/14/2015 0435   COCAINSCRNUR NONE DETECTED 02/14/2015 0435   LABBENZ NONE DETECTED 02/14/2015 0435   AMPHETMU NONE DETECTED 02/14/2015 0435   THCU NONE DETECTED 02/14/2015 0435   LABBARB NONE DETECTED 02/14/2015 0435     Recent Labs Lab 02/14/15 0253  ETH <5    IMAGING  Dg Chest  2 View 02/14/2015   No active disease.     Ct Head Wo Contrast 02/14/2015   1. No acute finding. 2. Intracranial hemorrhage and right frontal lobe contusion seen July 2016 has resolved. Stable nondepressed right occipital bone fracture. 3. Chronic small vessel disease.     MRI HEAD 02/14/2015    Acute 17 x 12 mm acute infarct LEFT posterior frontal lobe, MCA territory.  LEFT inferior frontal lobe encephalomalacia at site of prior trauma. Small LEFT chronic subdural hematomas versus hygromas.  Moderate to severe white matter changes compatible with chronic small vessel ischemic disease. Old small bilateral cerebellar infarcts.    MRA HEAD 02/14/2015    No acute large vessel occlusion or high-grade stenosis.  Moderate stenosis RIGHT P1 segment, mild stenosis  RIGHT M1 segment.     2-D echo - cancelled as pt wants to go home  Carotid Doppler - cancelled as pt wants to go home   Physical exam  Temp:  [97.6 F (36.4 C)-98.4 F (36.9 C)] 97.8 F (36.6 C) (08/17 1022) Pulse Rate:  [70-81] 81 (08/17 1022) Resp:  [16-18] 18 (08/17 1022) BP: (113-155)/(58-97) 116/58 mmHg (08/17 1022) SpO2:  [94 %-96 %] 96 % (08/17 1022)  General - Well nourished, well developed, in no apparent distress.  Ophthalmologic - Sharp disc margins OU.  Cardiovascular - irregularly irregular heart rate and rhythm  Mental Status -  Level of arousal and orientation to time, place, and person were intact. Language including expression, naming, repetition, comprehension was assessed and found intact. Fund of Knowledge was assessed and was intact.  Cranial Nerves II - XII - II - Visual field intact OU. III, IV, VI - Extraocular movements intact. V - Facial sensation intact bilaterally. VII - Facial movement intact bilaterally. VIII - Hearing & vestibular intact bilaterally. X - Palate elevates symmetrically. XI - Chin turning & shoulder shrug intact bilaterally. XII - Tongue protrusion intact.  Motor Strength - The patient's strength was normal in all extremities except left hand grip 5-/5 and mild dexterity difficulty and pronator drift was absent.  Bulk was normal and fasciculations were absent.   Motor Tone - Muscle tone was assessed at the neck and appendages and was normal.  Reflexes - The patient's reflexes were 1+ in all extremities and she had no pathological reflexes.  Sensory - Light touch, temperature/pinprick were assessed and were symmetrical.    Coordination - The patient had normal movements in the hands and feet with no ataxia or dysmetria.  Tremor was absent.  Gait and Station - deferred due to safety concerns.   ASSESSMENT/PLAN [name] is a 81 y.o. female with history of PAF off anticoagulation due to apixaban related ICH on  01/02/15, skin cancer of the nose, asthma, and GERD presenting with right arm monoparesis. She did not receive IV t-PA due to very recent apixaban related ICH on 01/02/15.   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 L frontal encephalomalacia. L chronic SDH vs hygroma. Old small bilateral cerebellar infarcts  MRA  No large vessel occlusion. Moderate R P1 and mild R M1 stenosis  CT head showed resolution of previous traumatic ICH, SDH and SAH  Carotid Doppler  Cancelled as pt wants to go home   2D Echo   Cancelled as pt wants to go home  LDL 137  HgbA1c 6.0  SCDs for VTE prophylaxis  Diet Heart Room service appropriate?: Yes; Fluid consistency:: Thin Diet - low sodium heart healthy  no antithrombotic prior to admission due to recent ICH. No antithrombotics now due to [place]. CT had showed resolution of previous bleeding, due to risk of embolic stroke, we recommend resume Eliquis.  Ongoing aggressive stroke risk factor management  Therapy recommendations:  HH PT , OT  Disposition:  Home with HH RN, PT, OT, MSW and HHA  If HA at home, pt and family advised not to take Asprin, ok to take tylenol  Agree with plans for discharge once workup completed  followup Dr. Erlinda Hong in 2 months. Order written.  Paroxysmal Atrial Fibrillation  Home anticoagulation:  None due to recent ICH on apixaban  CHA2DS2-VASc Score = 5, ?2 oral anticoagulation recommended  Age in Years:  ?81   +2    Sex:  Female   Female   +1    Hypertension History:  0   Diabetes Mellitus:  0   Congestive Heart Failure History:  0  Vascular Disease History:  0  Stroke/TIA/Thromboembolism History:  yes   +2  Dr. Irish Lack agrees to resume eliquis at this time.  Restarted at same dose.   Hx of hyperlipidemia  Home meds:  No statin  LDL 139, goal < 70  New zocor 20 mg added  Continue statin at  discharge  Other Stroke Risk Factors  Advanced age  ETOH use  Hx stroke/TIA - Jan 2015 cerebellar infarct embolic d/t atrial fibrillation, July 2016 traumatic ICH, SAH and SDH d/t fall  Family hx stroke (sister)  Hospital day # 1  Neurology will sign off. Please call with questions. Pt will follow up with Dr. Erlinda Hong at [facility] in about 2 months. Thanks for the consult.  Rosalin Hawking, MD PhD Stroke Neurology 02/15/2015 6:27 PM   To contact Stroke Continuity provider, please refer to [email or web address] After hours, contact General Neurology

## 2015-02-15 NOTE — Discharge Summary (Signed)
Physician Discharge Summary  Caroline Myers VOJ:[id number] DOB: Aug 01, 1923 DOA: 02/14/2015  PCP:  Caroline Crutch, MD  Admit date: 02/14/2015 Discharge date: 02/15/2015  Time spent: 35 minutes  Recommendations for Outpatient Follow-up:  1. Home health 2. 24 hour supervision 3. Echo/carotid outpatient   Discharge Diagnoses:  Principal Problem:   Stroke Active Problems:   Chronic atrial fibrillation   Stroke with cerebral ischemia   Cerebral infarction due to embolism of left middle cerebral artery   Discharge Condition: improved  Diet recommendation: cardiac  Filed Weights   02/14/15 0300 02/14/15 0800  Weight: 64.411 kg (142 lb) 64.365 kg (141 lb 14.4 oz)    History of present illness:  Caroline Myers is a 81 y.o. female with known history of chronic atrial fibrillation who was admitted last month for subarachnoid hemorrhage and subdural hemorrhage with occipital skull fracture after patient had a fall. At that time patient was on Apixaban was discontinued. Patient last evening went to bed around 11 PM and at around 1 AM patient got up to go to the bathroom and patient noticed her right upper extremity was weak and unable to be raised against gravity. Patient was brought to the ER and CT head did not show anything acute. Patient still has weakness of the right upper extremity and has been admitted for further management of stroke. Patient states she has chronic difficulty with speech and has no new changes. Denies any difficulty with vision or swallowing. Denies any weakness of the other extremities.  Hospital Course:  Stroke: Dominant left MCA posterior frontal lobe infarct, embolic secondary to known atrial fibrillation   MRI L posterior frontal lobe MCA territory infarct. L frontal encephalomalacia. L chronic SDH vs hygroma. Old small bilateral cerebellar infarcts  MRA No large vessel occlusion. Moderate R P1 and mild R M1 stenosis  CT head showed resolution of previous  traumatic ICH, SDH and SAH  LDL?70- start statin  Have cause of CVA- will not do echo/carotid inpatient  HgbA1c 6  Diet Heart Room service appropriate?: Yes; Fluid consistency:: Thin  Neuro recommends resuming eliquis-- spoke with patient's cardiologist who is in agreement-- spoke with patient and husband regarding risks of bleeding on eliquis and further CVAs if not on  Ongoing aggressive stroke risk factor management  Home health  Paroxysmal Atrial Fibrillation  Home anticoagulation: None due to recent ICH on apixaban  CHA2DS2-VASc Score = 5, ?2 oral anticoagulation recommended Age in Years: ?38 +2  Sex: Female Female +1  Hypertension History: 0  Diabetes Mellitus: 0  Congestive Heart Failure History: 0 Vascular Disease History: 0 Stroke/TIA/Thromboembolism History: yes +2  Hx of hyperlipidemia  Home meds: No statin  LDL  goal < 70- start statin  Procedures:    Consultations:  neuro  Discharge Exam: Filed Vitals:   02/15/15 1022  BP: 116/58  Pulse: 81  Temp: 97.8 F (36.6 C)  Resp: 18    General: awake, walking around with PT   Discharge Instructions   Discharge Instructions    Diet - low sodium heart healthy    Complete by:  As directed      Discharge instructions    Complete by:  As directed   Home health 24 hour supervision     Increase activity slowly    Complete by:  As directed           Current Discharge Medication List    START taking these medications   Details  apixaban (ELIQUIS) 5 MG TABS  tablet Take 1 tablet (5 mg total) by mouth 2 (two) times daily. Qty: 60 tablet, Refills: 0    simvastatin (ZOCOR) 20 MG tablet Take 1 tablet (20 mg total) by mouth daily at 6 PM. Qty: 30 tablet, Refills: 0      CONTINUE these medications which have NOT CHANGED   Details  sertraline (ZOLOFT) 50 MG tablet Take 50 mg  by mouth daily. Refills: 4      STOP taking these medications     acetaminophen (TYLENOL) 325 MG tablet        Allergies  Allergen Reactions  . Tape Other (See Comments)    Rips skin   Follow-up Information    Follow up with  Caroline Crutch, MD In 1 week.   Specialty:  Family Medicine   Contact information:   [place] [address] [phone]        The results of significant diagnostics from this hospitalization (including imaging, microbiology, ancillary and laboratory) are listed below for reference.    Significant Diagnostic Studies: Dg Chest 2 View  02/14/2015   CLINICAL DATA:  Weakness and shortness of Breath  EXAM: CHEST - 2 VIEW  COMPARISON:  11/28/2014  FINDINGS: Cardiac shadow is enlarged. The lungs are well aerated bilaterally. No focal infiltrate or sizable effusion is seen. No bony abnormality is noted.  IMPRESSION: No active disease.   Electronically Signed   By: Inez Catalina M.D.   On: 02/14/2015 07:47   Ct Head Wo Contrast  02/14/2015   CLINICAL DATA:  Right hand weakness.  Code stroke.  EXAM: CT HEAD WITHOUT CONTRAST  TECHNIQUE: Contiguous axial images were obtained from the Myers of the skull through the vertex without intravenous contrast.  COMPARISON:  01/20/2015  FINDINGS: Skull and Sinuses:Nondepressed Right occipital bone fracture extending from the foramen magnum towards the lambdoid suture is stable from priors. No acute osseous findings.  Orbits: No acute finding.  Bilateral cataract resection.  Brain: No evidence of acute infarction, hemorrhage, hydrocephalus, or mass lesion/mass effect. ASPECTS is normal at 10.  Resolved inferior frontal lobe swelling, the site of contusion July 2016. No residual intracranial hemorrhage.  Generalized brain atrophy. Stable pattern of chronic small-vessel ischemic gliosis in the bilateral cerebral white matter.  Critical Value/emergent results were called by telephone at the time of interpretation on  02/14/2015 at 3:03 am to Dr. Aram Beecham , who verbally acknowledged these results.  IMPRESSION: 1. No acute finding. 2. Intracranial hemorrhage and right frontal lobe contusion seen July 2016 has resolved. Stable nondepressed right occipital bone fracture. 3. Chronic small vessel disease.   Electronically Signed   By: Monte Fantasia M.D.   On: 02/14/2015 03:06   Ct Head Wo Contrast  01/20/2015   CLINICAL DATA:  Traumatic subarachnoid hemorrhage, subsequent counter.  EXAM: CT HEAD WITHOUT CONTRAST  TECHNIQUE: Contiguous axial images were obtained from the Myers of the skull through the vertex without intravenous contrast.  COMPARISON:  01/05/2015  FINDINGS: Skull and Sinuses:Stable nondepressed right occipital bone fracture.  Orbits: Bilateral cataract resection.  Brain: Bilateral inferior frontal hemorrhagic contusions and subarachnoid hemorrhage has resolved. Mass effect is also decreasing, with diminished edema and presumed developing gliosis. No new intracranial hemorrhage is seen. Focal expansion of the extra-axial space along the posterior left frontal lobe is stable could reflect a small chronic hygroma.  Generalized brain atrophy. Small-vessel disease is extensive, with patchy gliosis throughout the bilateral cerebral white matter and remote left inferior cerebellar infarct. No acute infarct.  No hydrocephalus.  IMPRESSION: 1. Resolved intracranial hemorrhage and decreased swelling at the inferior frontal contusions. 2. Stable nondepressed right occipital bone fracture.   Electronically Signed   By: Monte Fantasia M.D.   On: 01/20/2015 11:21   Mr Jodene Nam Head Wo Contrast  02/14/2015   CLINICAL DATA:  Acute onset RIGHT hand weakness, moderate RIGHT upper extremity weakness beginning at 1 a.m. History of atrial fibrillation, cancer.  EXAM: MRI HEAD WITHOUT CONTRAST  MRA HEAD WITHOUT CONTRAST  TECHNIQUE: Multiplanar, multiecho pulse sequences of the brain and surrounding structures were obtained without  intravenous contrast. Angiographic images of the head were obtained using MRA technique without contrast.  COMPARISON:  CT head February 14, 2015 at 3:01 a.m. an MRI of the brain July 27, 2014  FINDINGS: MRI HEAD FINDINGS  Patchy 17 x 12 mm reduced diffusion LEFT posterior frontal lobe extending to the centrum semiovale, corresponding low ADC values. Spurious reduced diffusion LEFT inferior frontal lobe at site of prior subdural hemorrhage. Nonspecific punctate focus of susceptibility artifact RIGHT cerebellum. Ventricles and sulci are normal for patient's age. Old small bilateral cerebellar infarcts. Patchy to confluent supratentorial white matter T2 hyperintensities. Innumerable punctate foci of T2 bright signal in the basal ganglia and thalamus, predominately representing perivascular spaces, possible superimposed lacunar infarcts. Minimal area LEFT inferior frontal lobe encephalomalacia. No midline shift, mass effect or mass lesions.  5 mm T2 bright chronic LEFT subdural hematoma versus hygroma is unchanged. Mildly prominent LEFT extra-axial space could represent chronic subdural hematoma versus hygroma. Status post bilateral ocular lens implants. Small LEFT maxillary mucosal retention cyst, paranasal sinuses and mastoid air cells are otherwise well aerated. No abnormal sellar expansion. No cerebellar tonsillar ectopia. No suspicious calvarial bone marrow signal. Small frontal scalp lipoma versus hematoma.  MRA HEAD FINDINGS  Anterior circulation: Normal flow related enhancement of the cervical, petrous, cavernous and supraclinoid internal carotid arteries. Anterior communicating artery is patent. Normal flow related enhancement the anterior and middle cerebral arteries. Mild stenosis distal RIGHT M1 segment.  Posterior circulation: RIGHT vertebral artery is dominant. Normal flow related enhancement of the vertebral arteries, basilar artery, main branch vessels. Fetal origin of the bilateral posterior cerebral  arteries. Moderate stenosis RIGHT distal P1 segments.  No aneurysm, acute large vessel occlusion, focal high-grade stenosis or suspicious luminal irregularity.  IMPRESSION: MRI HEAD: Acute 17 x 12 mm acute infarct LEFT posterior frontal lobe, MCA territory.  LEFT inferior frontal lobe encephalomalacia at site of prior trauma. Small LEFT chronic subdural hematomas versus hygromas.  Moderate to severe white matter changes compatible with chronic small vessel ischemic disease. Old small bilateral cerebellar infarcts.  MRA HEAD: No acute large vessel occlusion or high-grade stenosis.  Moderate stenosis RIGHT P1 segment, mild stenosis RIGHT M1 segment.   Electronically Signed   By: Elon Alas M.D.   On: 02/14/2015 06:29   Mr Brain Wo Contrast  02/14/2015   CLINICAL DATA:  Acute onset RIGHT hand weakness, moderate RIGHT upper extremity weakness beginning at 1 a.m. History of atrial fibrillation, cancer.  EXAM: MRI HEAD WITHOUT CONTRAST  MRA HEAD WITHOUT CONTRAST  TECHNIQUE: Multiplanar, multiecho pulse sequences of the brain and surrounding structures were obtained without intravenous contrast. Angiographic images of the head were obtained using MRA technique without contrast.  COMPARISON:  CT head February 14, 2015 at 3:01 a.m. an MRI of the brain July 27, 2014  FINDINGS: MRI HEAD FINDINGS  Patchy 17 x 12 mm reduced diffusion LEFT posterior frontal lobe extending to the centrum semiovale,  corresponding low ADC values. Spurious reduced diffusion LEFT inferior frontal lobe at site of prior subdural hemorrhage. Nonspecific punctate focus of susceptibility artifact RIGHT cerebellum. Ventricles and sulci are normal for patient's age. Old small bilateral cerebellar infarcts. Patchy to confluent supratentorial white matter T2 hyperintensities. Innumerable punctate foci of T2 bright signal in the basal ganglia and thalamus, predominately representing perivascular spaces, possible superimposed lacunar infarcts. Minimal  area LEFT inferior frontal lobe encephalomalacia. No midline shift, mass effect or mass lesions.  5 mm T2 bright chronic LEFT subdural hematoma versus hygroma is unchanged. Mildly prominent LEFT extra-axial space could represent chronic subdural hematoma versus hygroma. Status post bilateral ocular lens implants. Small LEFT maxillary mucosal retention cyst, paranasal sinuses and mastoid air cells are otherwise well aerated. No abnormal sellar expansion. No cerebellar tonsillar ectopia. No suspicious calvarial bone marrow signal. Small frontal scalp lipoma versus hematoma.  MRA HEAD FINDINGS  Anterior circulation: Normal flow related enhancement of the cervical, petrous, cavernous and supraclinoid internal carotid arteries. Anterior communicating artery is patent. Normal flow related enhancement the anterior and middle cerebral arteries. Mild stenosis distal RIGHT M1 segment.  Posterior circulation: RIGHT vertebral artery is dominant. Normal flow related enhancement of the vertebral arteries, basilar artery, main branch vessels. Fetal origin of the bilateral posterior cerebral arteries. Moderate stenosis RIGHT distal P1 segments.  No aneurysm, acute large vessel occlusion, focal high-grade stenosis or suspicious luminal irregularity.  IMPRESSION: MRI HEAD: Acute 17 x 12 mm acute infarct LEFT posterior frontal lobe, MCA territory.  LEFT inferior frontal lobe encephalomalacia at site of prior trauma. Small LEFT chronic subdural hematomas versus hygromas.  Moderate to severe white matter changes compatible with chronic small vessel ischemic disease. Old small bilateral cerebellar infarcts.  MRA HEAD: No acute large vessel occlusion or high-grade stenosis.  Moderate stenosis RIGHT P1 segment, mild stenosis RIGHT M1 segment.   Electronically Signed   By: Elon Alas M.D.   On: 02/14/2015 06:29    Microbiology: No results found for this or any previous visit (from the past 240 hour(s)).   Labs: Basic Metabolic  Panel:  Recent Labs Lab 02/14/15 0252 02/14/15 0306 02/14/15 1015  NA 138 139 140  K 4.0 3.9 3.7  CL 102 102 105  CO2 27  --  26  GLUCOSE 91 89 128*  BUN 22* 28* 17  CREATININE 0.85 0.80 0.77  CALCIUM 8.9  --  8.9   Liver Function Tests:  Recent Labs Lab 02/14/15 0252 02/14/15 1015  AST 26 30  ALT 15 15  ALKPHOS 81 84  BILITOT 0.5 0.7  PROT 6.7 6.8  ALBUMIN 3.3* 3.3*   No results for input(s): LIPASE, AMYLASE in the last 168 hours. No results for input(s): AMMONIA in the last 168 hours. CBC:  Recent Labs Lab 02/14/15 0252 02/14/15 0306  WBC 6.2  --   NEUTROABS 3.5  --   HGB 13.5 15.0  HCT 41.7 44.0  MCV 100.5*  --   PLT 188  --    Cardiac Enzymes: No results for input(s): CKTOTAL, CKMB, CKMBINDEX, TROPONINI in the last 168 hours. BNP: BNP (last 3 results) No results for input(s): BNP in the last 8760 hours.  ProBNP (last 3 results) No results for input(s): PROBNP in the last 8760 hours.  CBG: No results for input(s): GLUCAP in the last 168 hours.     SignedEulogio Bear  [facility] 02/15/2015, 1:07 PM

## 2015-02-15 NOTE — Progress Notes (Signed)
Patient being discharged home with home health, she is alert and oriented no IV home health is ordered, discharged summary reviewed and prescriptions were given family is transporting her.

## 2015-02-16 DIAGNOSIS — M4802 Spinal stenosis, cervical region: Secondary | ICD-10-CM | POA: Diagnosis not present

## 2015-02-16 DIAGNOSIS — S065X0D Traumatic subdural hemorrhage without loss of consciousness, subsequent encounter: Secondary | ICD-10-CM | POA: Diagnosis not present

## 2015-02-16 DIAGNOSIS — I4891 Unspecified atrial fibrillation: Secondary | ICD-10-CM | POA: Diagnosis not present

## 2015-02-16 DIAGNOSIS — S066X0D Traumatic subarachnoid hemorrhage without loss of consciousness, subsequent encounter: Secondary | ICD-10-CM | POA: Diagnosis not present

## 2015-02-16 DIAGNOSIS — F329 Major depressive disorder, single episode, unspecified: Secondary | ICD-10-CM | POA: Diagnosis not present

## 2015-02-16 DIAGNOSIS — S02119D Unspecified fracture of occiput, subsequent encounter for fracture with routine healing: Secondary | ICD-10-CM | POA: Diagnosis not present

## 2015-02-17 DIAGNOSIS — S02119D Unspecified fracture of occiput, subsequent encounter for fracture with routine healing: Secondary | ICD-10-CM | POA: Diagnosis not present

## 2015-02-17 DIAGNOSIS — F329 Major depressive disorder, single episode, unspecified: Secondary | ICD-10-CM | POA: Diagnosis not present

## 2015-02-17 DIAGNOSIS — I4891 Unspecified atrial fibrillation: Secondary | ICD-10-CM | POA: Diagnosis not present

## 2015-02-17 DIAGNOSIS — M4802 Spinal stenosis, cervical region: Secondary | ICD-10-CM | POA: Diagnosis not present

## 2015-02-17 DIAGNOSIS — S066X0D Traumatic subarachnoid hemorrhage without loss of consciousness, subsequent encounter: Secondary | ICD-10-CM | POA: Diagnosis not present

## 2015-02-17 DIAGNOSIS — S065X0D Traumatic subdural hemorrhage without loss of consciousness, subsequent encounter: Secondary | ICD-10-CM | POA: Diagnosis not present

## 2015-02-20 DIAGNOSIS — S066X0D Traumatic subarachnoid hemorrhage without loss of consciousness, subsequent encounter: Secondary | ICD-10-CM | POA: Diagnosis not present

## 2015-02-20 DIAGNOSIS — S065X0D Traumatic subdural hemorrhage without loss of consciousness, subsequent encounter: Secondary | ICD-10-CM | POA: Diagnosis not present

## 2015-02-20 DIAGNOSIS — F329 Major depressive disorder, single episode, unspecified: Secondary | ICD-10-CM | POA: Diagnosis not present

## 2015-02-20 DIAGNOSIS — I4891 Unspecified atrial fibrillation: Secondary | ICD-10-CM | POA: Diagnosis not present

## 2015-02-20 DIAGNOSIS — M4802 Spinal stenosis, cervical region: Secondary | ICD-10-CM | POA: Diagnosis not present

## 2015-02-20 DIAGNOSIS — S02119D Unspecified fracture of occiput, subsequent encounter for fracture with routine healing: Secondary | ICD-10-CM | POA: Diagnosis not present

## 2015-02-22 DIAGNOSIS — F329 Major depressive disorder, single episode, unspecified: Secondary | ICD-10-CM | POA: Diagnosis not present

## 2015-02-22 DIAGNOSIS — I4891 Unspecified atrial fibrillation: Secondary | ICD-10-CM | POA: Diagnosis not present

## 2015-02-22 DIAGNOSIS — M4802 Spinal stenosis, cervical region: Secondary | ICD-10-CM | POA: Diagnosis not present

## 2015-02-22 DIAGNOSIS — S065X0D Traumatic subdural hemorrhage without loss of consciousness, subsequent encounter: Secondary | ICD-10-CM | POA: Diagnosis not present

## 2015-02-22 DIAGNOSIS — S066X0D Traumatic subarachnoid hemorrhage without loss of consciousness, subsequent encounter: Secondary | ICD-10-CM | POA: Diagnosis not present

## 2015-02-22 DIAGNOSIS — S02119D Unspecified fracture of occiput, subsequent encounter for fracture with routine healing: Secondary | ICD-10-CM | POA: Diagnosis not present

## 2015-02-23 DIAGNOSIS — S02119D Unspecified fracture of occiput, subsequent encounter for fracture with routine healing: Secondary | ICD-10-CM | POA: Diagnosis not present

## 2015-02-23 DIAGNOSIS — S065X0D Traumatic subdural hemorrhage without loss of consciousness, subsequent encounter: Secondary | ICD-10-CM | POA: Diagnosis not present

## 2015-02-23 DIAGNOSIS — I4891 Unspecified atrial fibrillation: Secondary | ICD-10-CM | POA: Diagnosis not present

## 2015-02-23 DIAGNOSIS — M4802 Spinal stenosis, cervical region: Secondary | ICD-10-CM | POA: Diagnosis not present

## 2015-02-23 DIAGNOSIS — S066X0D Traumatic subarachnoid hemorrhage without loss of consciousness, subsequent encounter: Secondary | ICD-10-CM | POA: Diagnosis not present

## 2015-02-23 DIAGNOSIS — F329 Major depressive disorder, single episode, unspecified: Secondary | ICD-10-CM | POA: Diagnosis not present

## 2015-02-24 DIAGNOSIS — S02119D Unspecified fracture of occiput, subsequent encounter for fracture with routine healing: Secondary | ICD-10-CM | POA: Diagnosis not present

## 2015-02-24 DIAGNOSIS — S066X0D Traumatic subarachnoid hemorrhage without loss of consciousness, subsequent encounter: Secondary | ICD-10-CM | POA: Diagnosis not present

## 2015-02-24 DIAGNOSIS — S065X0D Traumatic subdural hemorrhage without loss of consciousness, subsequent encounter: Secondary | ICD-10-CM | POA: Diagnosis not present

## 2015-02-24 DIAGNOSIS — F329 Major depressive disorder, single episode, unspecified: Secondary | ICD-10-CM | POA: Diagnosis not present

## 2015-02-24 DIAGNOSIS — I4891 Unspecified atrial fibrillation: Secondary | ICD-10-CM | POA: Diagnosis not present

## 2015-02-24 DIAGNOSIS — M4802 Spinal stenosis, cervical region: Secondary | ICD-10-CM | POA: Diagnosis not present

## 2015-02-28 DIAGNOSIS — F329 Major depressive disorder, single episode, unspecified: Secondary | ICD-10-CM | POA: Diagnosis not present

## 2015-02-28 DIAGNOSIS — S065X0D Traumatic subdural hemorrhage without loss of consciousness, subsequent encounter: Secondary | ICD-10-CM | POA: Diagnosis not present

## 2015-02-28 DIAGNOSIS — S02119D Unspecified fracture of occiput, subsequent encounter for fracture with routine healing: Secondary | ICD-10-CM | POA: Diagnosis not present

## 2015-02-28 DIAGNOSIS — M4802 Spinal stenosis, cervical region: Secondary | ICD-10-CM | POA: Diagnosis not present

## 2015-02-28 DIAGNOSIS — S066X0D Traumatic subarachnoid hemorrhage without loss of consciousness, subsequent encounter: Secondary | ICD-10-CM | POA: Diagnosis not present

## 2015-02-28 DIAGNOSIS — I4891 Unspecified atrial fibrillation: Secondary | ICD-10-CM | POA: Diagnosis not present

## 2015-03-01 DIAGNOSIS — S065X0D Traumatic subdural hemorrhage without loss of consciousness, subsequent encounter: Secondary | ICD-10-CM | POA: Diagnosis not present

## 2015-03-01 DIAGNOSIS — Z0001 Encounter for general adult medical examination with abnormal findings: Secondary | ICD-10-CM | POA: Diagnosis not present

## 2015-03-01 DIAGNOSIS — M4802 Spinal stenosis, cervical region: Secondary | ICD-10-CM | POA: Diagnosis not present

## 2015-03-01 DIAGNOSIS — S066X0D Traumatic subarachnoid hemorrhage without loss of consciousness, subsequent encounter: Secondary | ICD-10-CM | POA: Diagnosis not present

## 2015-03-01 DIAGNOSIS — I4891 Unspecified atrial fibrillation: Secondary | ICD-10-CM | POA: Diagnosis not present

## 2015-03-01 DIAGNOSIS — K219 Gastro-esophageal reflux disease without esophagitis: Secondary | ICD-10-CM | POA: Diagnosis not present

## 2015-03-01 DIAGNOSIS — I480 Paroxysmal atrial fibrillation: Secondary | ICD-10-CM | POA: Diagnosis not present

## 2015-03-01 DIAGNOSIS — F430 Acute stress reaction: Secondary | ICD-10-CM | POA: Diagnosis not present

## 2015-03-01 DIAGNOSIS — E780 Pure hypercholesterolemia: Secondary | ICD-10-CM | POA: Diagnosis not present

## 2015-03-01 DIAGNOSIS — Z23 Encounter for immunization: Secondary | ICD-10-CM | POA: Diagnosis not present

## 2015-03-01 DIAGNOSIS — S02119D Unspecified fracture of occiput, subsequent encounter for fracture with routine healing: Secondary | ICD-10-CM | POA: Diagnosis not present

## 2015-03-01 DIAGNOSIS — I639 Cerebral infarction, unspecified: Secondary | ICD-10-CM | POA: Diagnosis not present

## 2015-03-01 DIAGNOSIS — F329 Major depressive disorder, single episode, unspecified: Secondary | ICD-10-CM | POA: Diagnosis not present

## 2015-03-01 DIAGNOSIS — M419 Scoliosis, unspecified: Secondary | ICD-10-CM | POA: Diagnosis not present

## 2015-03-02 DIAGNOSIS — S066X0D Traumatic subarachnoid hemorrhage without loss of consciousness, subsequent encounter: Secondary | ICD-10-CM | POA: Diagnosis not present

## 2015-03-02 DIAGNOSIS — S065X0D Traumatic subdural hemorrhage without loss of consciousness, subsequent encounter: Secondary | ICD-10-CM | POA: Diagnosis not present

## 2015-03-02 DIAGNOSIS — M4802 Spinal stenosis, cervical region: Secondary | ICD-10-CM | POA: Diagnosis not present

## 2015-03-02 DIAGNOSIS — S02119D Unspecified fracture of occiput, subsequent encounter for fracture with routine healing: Secondary | ICD-10-CM | POA: Diagnosis not present

## 2015-03-02 DIAGNOSIS — F329 Major depressive disorder, single episode, unspecified: Secondary | ICD-10-CM | POA: Diagnosis not present

## 2015-03-02 DIAGNOSIS — I4891 Unspecified atrial fibrillation: Secondary | ICD-10-CM | POA: Diagnosis not present

## 2015-03-03 DIAGNOSIS — F329 Major depressive disorder, single episode, unspecified: Secondary | ICD-10-CM | POA: Diagnosis not present

## 2015-03-03 DIAGNOSIS — S02119D Unspecified fracture of occiput, subsequent encounter for fracture with routine healing: Secondary | ICD-10-CM | POA: Diagnosis not present

## 2015-03-03 DIAGNOSIS — M4802 Spinal stenosis, cervical region: Secondary | ICD-10-CM | POA: Diagnosis not present

## 2015-03-03 DIAGNOSIS — S065X0D Traumatic subdural hemorrhage without loss of consciousness, subsequent encounter: Secondary | ICD-10-CM | POA: Diagnosis not present

## 2015-03-03 DIAGNOSIS — I4891 Unspecified atrial fibrillation: Secondary | ICD-10-CM | POA: Diagnosis not present

## 2015-03-03 DIAGNOSIS — S066X0D Traumatic subarachnoid hemorrhage without loss of consciousness, subsequent encounter: Secondary | ICD-10-CM | POA: Diagnosis not present

## 2015-03-07 ENCOUNTER — Telehealth: Payer: Self-pay | Admitting: Interventional Cardiology

## 2015-03-07 DIAGNOSIS — M4802 Spinal stenosis, cervical region: Secondary | ICD-10-CM | POA: Diagnosis not present

## 2015-03-07 DIAGNOSIS — F329 Major depressive disorder, single episode, unspecified: Secondary | ICD-10-CM | POA: Diagnosis not present

## 2015-03-07 DIAGNOSIS — I4891 Unspecified atrial fibrillation: Secondary | ICD-10-CM | POA: Diagnosis not present

## 2015-03-07 DIAGNOSIS — S066X0D Traumatic subarachnoid hemorrhage without loss of consciousness, subsequent encounter: Secondary | ICD-10-CM | POA: Diagnosis not present

## 2015-03-07 DIAGNOSIS — S02119D Unspecified fracture of occiput, subsequent encounter for fracture with routine healing: Secondary | ICD-10-CM | POA: Diagnosis not present

## 2015-03-07 DIAGNOSIS — S065X0D Traumatic subdural hemorrhage without loss of consciousness, subsequent encounter: Secondary | ICD-10-CM | POA: Diagnosis not present

## 2015-03-07 NOTE — Telephone Encounter (Signed)
Home Health nurse states that pt's BP normally runs 113-119/60-70 but has been running low since 8/31. Pt's BP today was 94/56, HR 77. 03/03/15- 90/60, 76 03/01/15- 84/62, 78. Pt denies any sx's and drinks about 64oz of fluids a day, Pt not currently on any BP meds. Pt seen in ED on 8/16 for stroke. [place] nurse and pt wanted to be seen sooner. Moved pt to 03/09/15. Will forward information to Dr. Irish Lack.

## 2015-03-07 NOTE — Telephone Encounter (Signed)
New Message     Calling stating that pt's BP's have been 94/56, 90/60, 84/62 Pt is staying hydrated, has had a recent fall and that was due to not eating. Pt states she isn't dizzy or having any headaches and is not currently on any BP medications. Please call back and advise.

## 2015-03-09 ENCOUNTER — Ambulatory Visit (INDEPENDENT_AMBULATORY_CARE_PROVIDER_SITE_OTHER): Payer: Medicare Other | Admitting: Interventional Cardiology

## 2015-03-09 ENCOUNTER — Encounter: Payer: Self-pay | Admitting: Interventional Cardiology

## 2015-03-09 VITALS — BP 116/74 | HR 75 | Ht 63.0 in | Wt 140.0 lb

## 2015-03-09 DIAGNOSIS — I639 Cerebral infarction, unspecified: Secondary | ICD-10-CM | POA: Diagnosis not present

## 2015-03-09 DIAGNOSIS — I359 Nonrheumatic aortic valve disorder, unspecified: Secondary | ICD-10-CM | POA: Diagnosis not present

## 2015-03-09 DIAGNOSIS — I4892 Unspecified atrial flutter: Secondary | ICD-10-CM

## 2015-03-09 DIAGNOSIS — I480 Paroxysmal atrial fibrillation: Secondary | ICD-10-CM

## 2015-03-09 DIAGNOSIS — I951 Orthostatic hypotension: Secondary | ICD-10-CM | POA: Diagnosis not present

## 2015-03-09 NOTE — Progress Notes (Signed)
Patient ID: Caroline Myers, female   DOB: 1923/11/08, 81 y.o.   MRN: [id number]     Cardiology Office Note   Date:  03/09/2015   ID:  Caroline Myers, DOB [birth year], MRN [id number]  PCP:   Melinda Crutch, MD    No chief complaint on file.  follow-up hypotension, atrial fibrillation   Wt Readings from Last 3 Encounters:  03/09/15 140 lb (63.504 kg)  02/14/15 141 lb 14.4 oz (64.365 kg)  01/02/15 142 lb 10.2 oz (64.7 kg)       History of Present Illness: Caroline Myers is a 81 y.o. female  who has had atrial fibrillation. Over the past few months, she has had a difficult course. In July, she fell and hit her head.  She was squatting to put some dishes away and then stood up and fell backward. She had a small subarachnoid hemorrhage with some skull trauma. Her Eliquis was held for several weeks. While off the Eliquis, she had a small stroke and was admitted to the hospital. Her Eliquis was restarted. She has been more careful about avoiding falling. She has not had any significant bleeding problems. She did bang her leg after stumbling but has not hit her head.  She denies any palpitations. She had low blood pressure readings when the home health nurse came to her house. She is here for follow-up. She denies any lightheadedness, syncope, chest pain, shortness of breath or palpitations. Even when the blood pressure readings were low, she was feeling fine.      Past Medical History  Diagnosis Date  . Allergy     allergic rhinitis  . Cancer     history of skin cancer of the nose  . Asthma 2000    mild. tried Advair after bronchospasm after exposure to cats  . Shoulder pain, left 05/2010    with rotator cuff tear--Dr Alfonso Ramus  . Paroxysmal atrial fibrillation     pt does not want anticoagulation  . Aortic stenosis     EF normal.  Mild aortic stenosis. Mild to moderate aortic regurgitation  . Anxiety   . Shortness of breath     occ  . GERD (gastroesophageal reflux disease)    occ  . Arthritis     Past Surgical History  Procedure Laterality Date  . Hernia repair  2003  . Acromioplasty  2000    right shoulder, arthroscopic  . Lumbar laminectomy/decompression microdiscectomy  04/01/2012    Procedure: LUMBAR LAMINECTOMY/DECOMPRESSION MICRODISCECTOMY 1 LEVEL;  Surgeon: Eustace Moore, MD;  Location: [facility];  Service: Neurosurgery;  Laterality: Right;  Right Lumbar four-five extraforaminal microdiskectomy      Current Outpatient Prescriptions  Medication Sig Dispense Refill  . apixaban (ELIQUIS) 5 MG TABS tablet Take 1 tablet (5 mg total) by mouth 2 (two) times daily. 60 tablet 0  . B Complex-C (B-COMPLEX WITH VITAMIN C) tablet Take 1 tablet by mouth daily.    . sertraline (ZOLOFT) 50 MG tablet Take 50 mg by mouth daily.  4   No current facility-administered medications for this visit.    Allergies:   Tape    Social History:  The patient  reports that she has never smoked. She has never used smokeless tobacco. She reports that she drinks alcohol. She reports that she does not use illicit drugs.   Family History:  The patient's family history includes Stroke in her sister. There is no history of Heart attack.    ROS:  Please  see the history of present illness.   Otherwise, review of systems are positive for falls as noted above.   All other systems are reviewed and negative.    PHYSICAL EXAM: VS:  BP 116/74 mmHg  Pulse 75  Ht 5\' 3"  (1.6 m)  Wt 140 lb (63.504 kg)  BMI 24.81 kg/m2 , BMI Body mass index is 24.81 kg/(m^2). GEN: Well nourished, well developed, in no acute distress HEENT: normal Neck: no JVD, carotid bruits, or masses Cardiac: Irregularly irregular; no murmurs, rubs, or gallops,no edema  Respiratory:  clear to auscultation bilaterally, normal work of breathing GI: soft, nontender, nondistended, + BS MS: no deformity or atrophy Skin: warm and dry, no rash Neuro:  Strength and sensation are intact Psych: euthymic mood, full  affect   EKG:   The ekg ordered today demonstrates atrial flutter with controlled ventricular response   Recent Labs: 01/02/2015: TSH 1.682 02/14/2015: ALT 15; BUN 17; Creatinine, Ser 0.77; Hemoglobin 15.0; Platelets 188; Potassium 3.7; Sodium 140   Lipid Panel    Component Value Date/Time   CHOL 199 02/15/2015 0054   TRIG 101 02/15/2015 0054   HDL 42 02/15/2015 0054   CHOLHDL 4.7 02/15/2015 0054   VLDL 20 02/15/2015 0054   LDLCALC 137* 02/15/2015 0054     Other studies Reviewed: Additional studies/ records that were reviewed today with results demonstrating: Neurosurgery consult report reviewed and noted above.   ASSESSMENT AND PLAN:  1. Atrial fibrillation/atrial flutter: Given that she had a stroke when she was off of her Eliquis, I think she needs to stay on it. She is rate controlled. We spoke about the importance of avoiding any head trauma. She needs to change positions slowly and make sure she has something to hold onto. She should not be doing any heavy lifting at all.  2. Hypotension: Blood pressure normal today. I encouraged her to stay well hydrated. I wonder if her blood pressure readings were erroneous due to her irregular heartbeat. If her blood pressure is really low, she would likely have some symptoms. 3. Mild aortic stenosis noted in the past. No symptoms of severe aortic stenosis.   Current medicines are reviewed at length with the patient today.  The patient concerns regarding her medicines were addressed.  The following changes have been made:  No change  Labs/ tests ordered today include:  No orders of the defined types were placed in this encounter.    Recommend 150 minutes/week of aerobic exercise Low fat, low carb, high fiber diet recommended  Disposition:   FU in 6 months as scheduled   Teresita Madura., MD  03/09/2015 10:47 AM    [facility] [address], [place], [place]  [address] Phone: [phone];  Fax: [phone]

## 2015-03-09 NOTE — Patient Instructions (Addendum)
Medication Instructions:   Your physician recommends that you continue on your current medications as directed. Please refer to the Current Medication list given to you today.    Labwork: NONE ORDER TODAY    Testing/Procedures: NONE ORDER TODAY   Follow-Up:   IS IN OUR RECALL SYSTEM TO SEND  A LETTER IN THE MAIL TO CALL OFFICE TO SET UP AN APPOINTMENT  November    Any Other Special Instructions Will Be Listed Below (If Applicable).

## 2015-03-13 ENCOUNTER — Ambulatory Visit: Payer: Medicare Other | Admitting: Interventional Cardiology

## 2015-04-12 ENCOUNTER — Telehealth: Payer: Self-pay | Admitting: Interventional Cardiology

## 2015-04-12 NOTE — Telephone Encounter (Signed)
Pt calling requesting refill on eliquis 5 mg twice daily. Pt stated that she has enough to last until the end of October. Please advise

## 2015-04-14 NOTE — Telephone Encounter (Signed)
Patient advised, refill of Eliquis sent to [facility]

## 2015-04-18 ENCOUNTER — Ambulatory Visit (INDEPENDENT_AMBULATORY_CARE_PROVIDER_SITE_OTHER): Payer: Medicare Other | Admitting: Neurology

## 2015-04-18 ENCOUNTER — Encounter: Payer: Self-pay | Admitting: Neurology

## 2015-04-18 VITALS — BP 104/71 | HR 72 | Ht 65.0 in | Wt 141.6 lb

## 2015-04-18 DIAGNOSIS — I482 Chronic atrial fibrillation, unspecified: Secondary | ICD-10-CM

## 2015-04-18 DIAGNOSIS — Z7901 Long term (current) use of anticoagulants: Secondary | ICD-10-CM | POA: Diagnosis not present

## 2015-04-18 DIAGNOSIS — I639 Cerebral infarction, unspecified: Secondary | ICD-10-CM | POA: Diagnosis not present

## 2015-04-18 DIAGNOSIS — Z961 Presence of intraocular lens: Secondary | ICD-10-CM | POA: Diagnosis not present

## 2015-04-18 DIAGNOSIS — Z0100 Encounter for examination of eyes and vision without abnormal findings: Secondary | ICD-10-CM | POA: Diagnosis not present

## 2015-04-18 NOTE — Patient Instructions (Addendum)
-   continue eliquis for stroke prevention - discuss with PCP next time visit after statin medications. Your last LDL in 01/2015 was 137, and goal is < 70 - Follow up with your primary care physician for stroke risk factor modification. Recommend maintain blood pressure goal <130/80, diabetes with hemoglobin A1c goal below 6.5% and lipids with LDL cholesterol goal below 70 mg/dL.  - check BP at home - continue to follow up cardiology for afib - follow up in 3 months

## 2015-04-18 NOTE — Progress Notes (Signed)
STROKE NEUROLOGY FOLLOW UP NOTE  NAME: Caroline VESEY DOB: [birth year]  REASON FOR VISIT: stroke follow up HISTORY FROM: pt and chart  Today we had the pleasure of seeing Caroline Myers in follow-up at our Neurology Clinic. Pt was accompanied by husband.   History Summary Caroline Myers is an 81 y.o. female history of atrial fibrillation not on anticoagulation who experienced an episode of difficulty with reading comprehension for about one hour starting at 2 PM on 07/26/2013. She takes aspirin 81 mg per day. She reportedly has refused anticoagulation with Coumadin. CT scan of her head showed no acute intracranial abnormality. NIH stroke score was 0. Patient was not administerd TPA secondary to rapidly resolving deficits. MRI of the brain showed small acute right cerebellar infarct, several small acute to subacute lacunar infarcts in the left frontal lobe white matter, and a small chronic left cerebellar lacunar infarct. MRA of the brain showed patent distal vertebral and basilar arteries, the left vertebral terminates in PICA. Moderate to severe bilateral PCA stenosis with preserved distal flow. Mildly dolichoectatic anterior circulation. 2D Echocardiogram showed an EF 55-60% with no source of embolus. Upon discharge she was started on Eliquis daily. She had no lasting deficits and has no repeat symptoms.  Follow up 01/04/2014 (SP): She returns for followup after last visit 3 months ago. She continues to do well without recurrent stroke or TIA symptoms. She is tolerating eliquis well without significant bleeding, bruising or other side effects. She convinced to have mild gait and balance difficulties but these are unchanged. She's not had any major falls. She has had no new health problems.  Follow up 07/20/2014 (SP): she returns for follow-up after last visit 6 months ago. She has not had any recurrent stroke or TIA symptoms. She however has been under significant stress due to her husband's  sickness. She continues to have mild balance and gait difficulties and has had one fall since June last year. She also complains of occasional posterior headaches which are mild pressure-like in quality and 9 and 5/10 in severity. She does not need to take medications for these. She remains on eliquis which is tolerating well without significant bleeding and only minor bruising. She is also on Zoloft for depression but feels her depression is stable. She in the past used to meditate and 2 yolk but of late she has not managed to find time to do so. She does complain of mild short-term memory difficulties but these do not appear to be progressive or interfere with her activities of daily living.   Admission 01/05/15: was brought to the ER after patient had fall at her house. CT of the head shows nondepressed fracture of the occipital skull with bilateral subarachnoid hemorrhage and subdural hemorrhage. Patient was admitted for further observation. Patient is on Apixaban and was given [facility] for reversal. Her eliquis was discontinued. Repeat CT stable. After stabilization, she was discharged to home with plan to follow up with cardiology about timing of resume AC.  Admission 02/14/15: she was admitted for right arm weakness. MRI showed left posterior frontal MCA infarct, embolic pattern consistent with afib not on AC. Her CT head showed resolution of previous traumatic ICH, SAH, and SDH. MRA unremarkable. LDL 137 and A1C 6.0. She was restarted with eliquis for stroke prevention and discharged with zocor also in good condition.  Interval History During the interval time, the patient has been doing well. Right hand weakness resolved. On eliquis without problem. She saw Dr.  Varanasi on 03/09/15, continued on eliquis. She also saw PCP in 03/2015 but took off zocor without clear reason. Her BP is 104/71 in clinic today.  REVIEW OF SYSTEMS: Full 14 system review of systems performed and notable only for those listed  below and in HPI above, all others are negative:  Constitutional:   Cardiovascular:  Ear/Nose/Throat:   Skin: rash, mole, itching Eyes:  Light sensitivity Respiratory:  SOB, chest tightness Gastroitestinal:   Genitourinary:  Hematology/Lymphatic:  Bruise easily Endocrine:  Musculoskeletal:   Allergy/Immunology:   Neurological:   Psychiatric: decreased concentration Sleep:   The following represents the patient's updated allergies and side effects list: Allergies  Allergen Reactions  . Tape Other (See Comments)    Rips skin    The neurologically relevant items on the patient's problem list were reviewed on today's visit.  Neurologic Examination  A problem focused neurological exam (12 or more points of the single system neurologic examination, vital signs counts as 1 point, cranial nerves count for 8 points) was performed.  Blood pressure 104/71, pulse 72, height 5\' 5"  (1.651 m), weight 141 lb 9.6 oz (64.229 kg).  General - thin built, well developed, in no apparent distress.  Ophthalmologic - Fundi not visualized due to light sensitivity.  Cardiovascular - irregularly irregular heart rate and rhythm.  Mental Status -  Level of arousal and orientation to time, place, and person were intact. Language including expression, naming, repetition, comprehension was assessed and found intact. Fund of Knowledge was assessed and was intact.  Cranial Nerves II - XII - II - Visual field intact OU. III, IV, VI - Extraocular movements intact. V - Facial sensation intact bilaterally. VII - Facial movement intact bilaterally. VIII - Hearing & vestibular intact bilaterally. X - Palate elevates symmetrically. XI - Chin turning & shoulder shrug intact bilaterally. XII - Tongue protrusion intact.  Motor Strength - The patient's strength was normal in all extremities and pronator drift was absent.  Bulk was normal and fasciculations were absent.   Motor Tone - Muscle tone was assessed  at the neck and appendages and was normal.  Reflexes - The patient's reflexes were 1+ in all extremities and she had no pathological reflexes.  Sensory - Light touch, temperature/pinprick were assessed and were normal.    Coordination - The patient had normal movements in the hands and feet with no ataxia or dysmetria.  Tremor was absent.  Gait and Station - The patient's transfers, posture, gait, station, and turns were observed as normal.  Data reviewed: I personally reviewed the images and agree with the radiology interpretations.  Dg Chest 2 View 02/14/2015 No active disease.   Ct Head Wo Contrast 02/14/2015 1. No acute finding. 2. Intracranial hemorrhage and right frontal lobe contusion seen July 2016 has resolved. Stable nondepressed right occipital bone fracture. 3. Chronic small vessel disease.   MRI HEAD 02/14/2015 Acute 17 x 12 mm acute infarct LEFT posterior frontal lobe, MCA territory. LEFT inferior frontal lobe encephalomalacia at site of prior trauma. Small LEFT chronic subdural hematomas versus hygromas. Moderate to severe white matter changes compatible with chronic small vessel ischemic disease. Old small bilateral cerebellar infarcts.   MRA HEAD 02/14/2015 No acute large vessel occlusion or high-grade stenosis. Moderate stenosis RIGHT P1 segment, mild stenosis RIGHT M1 segment.   Assessment: As you may recall, she is a 81 y.o. Caucasian female with PMH of PAF off anticoagulation due to apixaban related ICH on 01/02/15, previous stroke due to afib not on [facility],  skin cancer of the nose, asthma, and GERD was admitted on 02/15/15 for left MCA territory infarct due to Afib off eliquis due to traumatic ICH, SAH and SDH. CT showed resolution of bleeding and she was back on eliquis. LDL was 137 and she was put on zocor. During the interval time, she is doing well with eliquis without problem. Her zocor was d/c by PCP without clear reason.  Plan:  - continue eliquis for  stroke prevention - discuss with PCP next time about statin medications.  - Follow up with your primary care physician for stroke risk factor modification. Recommend maintain blood pressure goal <130/80, diabetes with hemoglobin A1c goal below 6.5% and lipids with LDL cholesterol goal below 70 mg/dL.  - check BP at home - continue to follow up cardiology for afib - follow up in 3 months  No orders of the defined types were placed in this encounter.    No orders of the defined types were placed in this encounter.    Patient Instructions  - continue eliquis for stroke prevention - discuss with PCP next time visit after statin medications. Your last LDL in 01/2015 was 137, and goal is < 70 - Follow up with your primary care physician for stroke risk factor modification. Recommend maintain blood pressure goal <130/80, diabetes with hemoglobin A1c goal below 6.5% and lipids with LDL cholesterol goal below 70 mg/dL.  - check BP at home - continue to follow up cardiology for afib - follow up in 3 months    Caroline Hawking, MD PhD [facility] [address] [place], [place] [address] [phone]

## 2015-04-21 ENCOUNTER — Telehealth: Payer: Self-pay | Admitting: Interventional Cardiology

## 2015-04-21 NOTE — Telephone Encounter (Signed)
error 

## 2015-05-30 ENCOUNTER — Ambulatory Visit (INDEPENDENT_AMBULATORY_CARE_PROVIDER_SITE_OTHER): Payer: Medicare Other | Admitting: Interventional Cardiology

## 2015-05-30 ENCOUNTER — Encounter: Payer: Self-pay | Admitting: Interventional Cardiology

## 2015-05-30 VITALS — BP 109/70 | HR 80 | Ht 65.0 in | Wt 143.0 lb

## 2015-05-30 DIAGNOSIS — M546 Pain in thoracic spine: Secondary | ICD-10-CM

## 2015-05-30 DIAGNOSIS — I639 Cerebral infarction, unspecified: Secondary | ICD-10-CM | POA: Diagnosis not present

## 2015-05-30 DIAGNOSIS — I951 Orthostatic hypotension: Secondary | ICD-10-CM

## 2015-05-30 DIAGNOSIS — I359 Nonrheumatic aortic valve disorder, unspecified: Secondary | ICD-10-CM | POA: Diagnosis not present

## 2015-05-30 DIAGNOSIS — I480 Paroxysmal atrial fibrillation: Secondary | ICD-10-CM

## 2015-05-30 NOTE — Patient Instructions (Addendum)
**Note De-identified Caroline Myers Obfuscation** Medication Instructions:  Same-no changes  Labwork: None  Testing/Procedures: None  Follow-Up: Your physician wants you to follow-up in: 9 months. You will receive a reminder letter in the mail two months in advance. If you don't receive a letter, please call our office to schedule the follow-up appointment.     If you need a refill on your cardiac medications before your next appointment, please call your pharmacy.   

## 2015-05-30 NOTE — Progress Notes (Signed)
Patient ID: Caroline Myers, female   DOB: 05/11/1924, 81 y.o.   MRN: [id number]     Cardiology Office Note   Date:  05/30/2015   ID:  Caroline Myers, DOB 1923-09-14, MRN [id number]  PCP:   Melinda Crutch, MD    No chief complaint on file. f/u AFib   Wt Readings from Last 3 Encounters:  05/30/15 143 lb (64.864 kg)  04/18/15 141 lb 9.6 oz (64.229 kg)  03/09/15 140 lb (63.504 kg)       History of Present Illness: Caroline Myers is a 81 y.o. female  who has had atrial fibrillation. Over the past 6 months, she has had a difficult course. In July 2016, she fell and hit her head. She was squatting to put some dishes away and then stood up and fell backward. She had a small subarachnoid hemorrhage with some skull trauma. Her Eliquis was held for several weeks. While off the Eliquis, she had a small stroke and was admitted to the hospital in 8/16. Her Eliquis was restarted. She has been more careful about avoiding falling. She has not had any significant bleeding problems.  She denies any palpitations. She had low blood pressure readings when the home health nurse came to her house.  She is no longer checking her BP.  She is here for follow-up. She denies any lightheadedness, syncope, chest pain, shortness of breath or palpitations.   She was told she needed a statin.  Her LDL was 137.  SHe is not interested in taking a statin.    Past Medical History  Diagnosis Date  . Allergy     allergic rhinitis  . Cancer [facility])     history of skin cancer of the nose  . Asthma 2000    mild. tried Advair after bronchospasm after exposure to cats  . Shoulder pain, left 05/2010    with rotator cuff tear--Dr Alfonso Ramus  . Paroxysmal atrial fibrillation (HCC)     pt does not want anticoagulation  . Aortic stenosis     EF normal.  Mild aortic stenosis. Mild to moderate aortic regurgitation  . Anxiety   . Shortness of breath     occ  . GERD (gastroesophageal reflux disease)     occ  . Arthritis   .  Stroke [facility])     Past Surgical History  Procedure Laterality Date  . Hernia repair  2003  . Acromioplasty  2000    right shoulder, arthroscopic  . Lumbar laminectomy/decompression microdiscectomy  04/01/2012    Procedure: LUMBAR LAMINECTOMY/DECOMPRESSION MICRODISCECTOMY 1 LEVEL;  Surgeon: Eustace Moore, MD;  Location: [facility];  Service: Neurosurgery;  Laterality: Right;  Right Lumbar four-five extraforaminal microdiskectomy      Current Outpatient Prescriptions  Medication Sig Dispense Refill  . apixaban (ELIQUIS) 5 MG TABS tablet Take 1 tablet (5 mg total) by mouth 2 (two) times daily. 60 tablet 0  . B Complex-C (B-COMPLEX WITH VITAMIN C) tablet Take 1 tablet by mouth daily.    . sertraline (ZOLOFT) 50 MG tablet Take 50 mg by mouth daily.  4   No current facility-administered medications for this visit.    Allergies:   Tape    Social History:  The patient  reports that she has never smoked. She has never used smokeless tobacco. She reports that she drinks alcohol. She reports that she does not use illicit drugs.   Family History:  The patient's family history includes Stroke in her sister. There is  no history of Heart attack or Hypertension.    ROS:  Please see the history of present illness.   Otherwise, review of systems are positive for back pain-sharp in the right upper back- occurs randomly.   All other systems are reviewed and negative.    PHYSICAL EXAM: VS:  BP 109/70 mmHg  Pulse 80  Ht 5\' 5"  (1.651 m)  Wt 143 lb (64.864 kg)  BMI 23.80 kg/m2 , BMI Body mass index is 23.8 kg/(m^2). GEN: Well nourished, well developed, in no acute distress HEENT: normal Neck: no JVD, carotid bruits, or masses Cardiac: RRR; no murmurs, rubs, or gallops,no edema  Respiratory:  clear to auscultation bilaterally, normal work of breathing GI: soft, nontender, nondistended, + BS MS: no deformity or atrophy, pain to palpation of the back Skin: warm and dry, no rash Neuro:  Strength  and sensation are intact Psych: euthymic mood, full affect     Recent Labs: 01/02/2015: TSH 1.682 02/14/2015: ALT 15; BUN 17; Creatinine, Ser 0.77; Hemoglobin 15.0; Platelets 188; Potassium 3.7; Sodium 140   Lipid Panel    Component Value Date/Time   CHOL 199 02/15/2015 0054   TRIG 101 02/15/2015 0054   HDL 42 02/15/2015 0054   CHOLHDL 4.7 02/15/2015 0054   VLDL 20 02/15/2015 0054   LDLCALC 137* 02/15/2015 0054     Other studies Reviewed: Additional studies/ records that were reviewed today with results demonstrating: Normal LVEF, mild AS in Jan 2015.   ASSESSMENT AND PLAN:  1. AFib: paroxysmal. Eliquis for stroke prevention. Avoid falling. 2. Hypotension: resolved.  Stay well hydrated. 3. AS: Mild. No sx of severe AS.  4. Back pain: Likely muscular.  I don't think this is cardiac related.   Current medicines are reviewed at length with the patient today.  The patient concerns regarding her medicines were addressed.  The following changes have been made:  No change  Labs/ tests ordered today include:  No orders of the defined types were placed in this encounter.    Recommend 150 minutes/week of aerobic exercise Low fat, low carb, high fiber diet recommended  Disposition:   FU in 9-10 months   Teresita Madura., MD  05/30/2015 11:01 AM    [facility] [place], [address], [place]  [address] Phone: [phone]; Fax: [phone]

## 2015-06-08 DIAGNOSIS — L570 Actinic keratosis: Secondary | ICD-10-CM | POA: Diagnosis not present

## 2015-06-08 DIAGNOSIS — L821 Other seborrheic keratosis: Secondary | ICD-10-CM | POA: Diagnosis not present

## 2015-06-08 DIAGNOSIS — Z85828 Personal history of other malignant neoplasm of skin: Secondary | ICD-10-CM | POA: Diagnosis not present

## 2015-06-08 DIAGNOSIS — D2271 Melanocytic nevi of right lower limb, including hip: Secondary | ICD-10-CM | POA: Diagnosis not present

## 2015-06-14 DIAGNOSIS — M461 Sacroiliitis, not elsewhere classified: Secondary | ICD-10-CM | POA: Diagnosis not present

## 2015-06-19 ENCOUNTER — Telehealth: Payer: Self-pay | Admitting: Interventional Cardiology

## 2015-06-19 NOTE — Telephone Encounter (Signed)
New Message      Pt calling stating that she dropped off paperwork for a prescription this morning at our office and there is something she needs to tell the nurse about the paperwork. Please call back and advise.

## 2015-06-19 NOTE — Telephone Encounter (Signed)
Called and spoke with patient.  The paperwork she dropped off today is for [employer] for Eliquis. Pt only has enough Eliquis until the end of December.  Will review with Dr. Hassell Done nurse tomorrow when she and MD are back in the office.

## 2015-06-19 NOTE — Telephone Encounter (Signed)
Number listed for call back is old office number for [facility].  I placed call to home number listed for pt but there was no answer and no voicemail.

## 2015-06-19 NOTE — Telephone Encounter (Signed)
Walk in patient form [place] form dropped off.  Will give to [place] on Tuesday 06/20/15.

## 2015-06-20 NOTE — Telephone Encounter (Addendum)
Form signed by Dr Irish Lack and given to Jenean Lindau, LPN to fax to [facility] and to contact the pt.

## 2015-06-22 DIAGNOSIS — F419 Anxiety disorder, unspecified: Secondary | ICD-10-CM | POA: Diagnosis not present

## 2015-06-22 DIAGNOSIS — R0600 Dyspnea, unspecified: Secondary | ICD-10-CM | POA: Diagnosis not present

## 2015-06-29 ENCOUNTER — Other Ambulatory Visit: Payer: Self-pay

## 2015-06-29 DIAGNOSIS — M5431 Sciatica, right side: Secondary | ICD-10-CM | POA: Diagnosis not present

## 2015-06-29 MED ORDER — APIXABAN 5 MG PO TABS: 5.0000 mg | ORAL_TABLET | Freq: Two times a day (BID) | ORAL | Status: DC

## 2015-07-02 DIAGNOSIS — J189 Pneumonia, unspecified organism: Secondary | ICD-10-CM

## 2015-07-02 HISTORY — DX: Pneumonia, unspecified organism: J18.9

## 2015-07-06 DIAGNOSIS — R58 Hemorrhage, not elsewhere classified: Secondary | ICD-10-CM | POA: Diagnosis not present

## 2015-07-12 DIAGNOSIS — R58 Hemorrhage, not elsewhere classified: Secondary | ICD-10-CM | POA: Diagnosis not present

## 2015-07-12 DIAGNOSIS — E7800 Pure hypercholesterolemia, unspecified: Secondary | ICD-10-CM | POA: Diagnosis not present

## 2015-07-13 ENCOUNTER — Telehealth: Payer: Self-pay

## 2015-07-13 NOTE — Telephone Encounter (Signed)
Patient Assistance Forms for Eliquis faxed to [facility].

## 2015-07-14 DIAGNOSIS — J45909 Unspecified asthma, uncomplicated: Secondary | ICD-10-CM | POA: Diagnosis not present

## 2015-07-14 DIAGNOSIS — R0602 Shortness of breath: Secondary | ICD-10-CM | POA: Diagnosis not present

## 2015-07-14 DIAGNOSIS — R0789 Other chest pain: Secondary | ICD-10-CM | POA: Diagnosis not present

## 2015-07-14 DIAGNOSIS — I480 Paroxysmal atrial fibrillation: Secondary | ICD-10-CM | POA: Diagnosis not present

## 2015-07-14 DIAGNOSIS — F419 Anxiety disorder, unspecified: Secondary | ICD-10-CM | POA: Diagnosis not present

## 2015-07-15 ENCOUNTER — Encounter (HOSPITAL_COMMUNITY): Payer: Self-pay

## 2015-07-15 ENCOUNTER — Inpatient Hospital Stay (HOSPITAL_COMMUNITY)
Admission: EM | Admit: 2015-07-15 | Discharge: 2015-07-20 | DRG: 871 | Disposition: A | Discharge status: Skilled Nursing Facility | Payer: Medicare Other | Attending: Internal Medicine | Admitting: Internal Medicine

## 2015-07-15 ENCOUNTER — Emergency Department (HOSPITAL_COMMUNITY): Payer: Medicare Other

## 2015-07-15 DIAGNOSIS — E876 Hypokalemia: Secondary | ICD-10-CM | POA: Diagnosis not present

## 2015-07-15 DIAGNOSIS — J189 Pneumonia, unspecified organism: Secondary | ICD-10-CM | POA: Diagnosis not present

## 2015-07-15 DIAGNOSIS — D72829 Elevated white blood cell count, unspecified: Secondary | ICD-10-CM | POA: Clinically undetermined

## 2015-07-15 DIAGNOSIS — I609 Nontraumatic subarachnoid hemorrhage, unspecified: Secondary | ICD-10-CM

## 2015-07-15 DIAGNOSIS — I480 Paroxysmal atrial fibrillation: Secondary | ICD-10-CM | POA: Diagnosis present

## 2015-07-15 DIAGNOSIS — I359 Nonrheumatic aortic valve disorder, unspecified: Secondary | ICD-10-CM | POA: Diagnosis not present

## 2015-07-15 DIAGNOSIS — J9602 Acute respiratory failure with hypercapnia: Secondary | ICD-10-CM | POA: Diagnosis not present

## 2015-07-15 DIAGNOSIS — R0682 Tachypnea, not elsewhere classified: Secondary | ICD-10-CM | POA: Insufficient documentation

## 2015-07-15 DIAGNOSIS — I482 Chronic atrial fibrillation: Secondary | ICD-10-CM | POA: Diagnosis not present

## 2015-07-15 DIAGNOSIS — R0600 Dyspnea, unspecified: Secondary | ICD-10-CM | POA: Diagnosis not present

## 2015-07-15 DIAGNOSIS — J40 Bronchitis, not specified as acute or chronic: Secondary | ICD-10-CM | POA: Diagnosis present

## 2015-07-15 DIAGNOSIS — S065XAA Traumatic subdural hemorrhage with loss of consciousness status unknown, initial encounter: Secondary | ICD-10-CM | POA: Diagnosis present

## 2015-07-15 DIAGNOSIS — I248 Other forms of acute ischemic heart disease: Secondary | ICD-10-CM | POA: Diagnosis present

## 2015-07-15 DIAGNOSIS — Z7901 Long term (current) use of anticoagulants: Secondary | ICD-10-CM

## 2015-07-15 DIAGNOSIS — I959 Hypotension, unspecified: Secondary | ICD-10-CM | POA: Diagnosis not present

## 2015-07-15 DIAGNOSIS — Z96611 Presence of right artificial shoulder joint: Secondary | ICD-10-CM | POA: Diagnosis present

## 2015-07-15 DIAGNOSIS — J811 Chronic pulmonary edema: Secondary | ICD-10-CM | POA: Diagnosis not present

## 2015-07-15 DIAGNOSIS — I10 Essential (primary) hypertension: Secondary | ICD-10-CM | POA: Diagnosis present

## 2015-07-15 DIAGNOSIS — I350 Nonrheumatic aortic (valve) stenosis: Secondary | ICD-10-CM | POA: Diagnosis present

## 2015-07-15 DIAGNOSIS — R6510 Systemic inflammatory response syndrome (SIRS) of non-infectious origin without acute organ dysfunction: Secondary | ICD-10-CM

## 2015-07-15 DIAGNOSIS — N179 Acute kidney failure, unspecified: Secondary | ICD-10-CM | POA: Diagnosis not present

## 2015-07-15 DIAGNOSIS — R6520 Severe sepsis without septic shock: Secondary | ICD-10-CM | POA: Diagnosis present

## 2015-07-15 DIAGNOSIS — Z66 Do not resuscitate: Secondary | ICD-10-CM | POA: Diagnosis present

## 2015-07-15 DIAGNOSIS — R509 Fever, unspecified: Secondary | ICD-10-CM

## 2015-07-15 DIAGNOSIS — A419 Sepsis, unspecified organism: Principal | ICD-10-CM | POA: Diagnosis present

## 2015-07-15 DIAGNOSIS — I272 Other secondary pulmonary hypertension: Secondary | ICD-10-CM | POA: Diagnosis present

## 2015-07-15 DIAGNOSIS — E8729 Other acidosis: Secondary | ICD-10-CM

## 2015-07-15 DIAGNOSIS — Z8673 Personal history of transient ischemic attack (TIA), and cerebral infarction without residual deficits: Secondary | ICD-10-CM | POA: Diagnosis not present

## 2015-07-15 DIAGNOSIS — I5031 Acute diastolic (congestive) heart failure: Secondary | ICD-10-CM | POA: Diagnosis not present

## 2015-07-15 DIAGNOSIS — Z09 Encounter for follow-up examination after completed treatment for conditions other than malignant neoplasm: Secondary | ICD-10-CM

## 2015-07-15 DIAGNOSIS — J9601 Acute respiratory failure with hypoxia: Secondary | ICD-10-CM | POA: Diagnosis present

## 2015-07-15 DIAGNOSIS — R0602 Shortness of breath: Secondary | ICD-10-CM | POA: Diagnosis not present

## 2015-07-15 DIAGNOSIS — I5033 Acute on chronic diastolic (congestive) heart failure: Secondary | ICD-10-CM | POA: Diagnosis not present

## 2015-07-15 DIAGNOSIS — E872 Acidosis: Secondary | ICD-10-CM | POA: Diagnosis present

## 2015-07-15 DIAGNOSIS — F411 Generalized anxiety disorder: Secondary | ICD-10-CM | POA: Diagnosis not present

## 2015-07-15 DIAGNOSIS — S065X9A Traumatic subdural hemorrhage with loss of consciousness of unspecified duration, initial encounter: Secondary | ICD-10-CM | POA: Diagnosis present

## 2015-07-15 DIAGNOSIS — R778 Other specified abnormalities of plasma proteins: Secondary | ICD-10-CM | POA: Diagnosis present

## 2015-07-15 DIAGNOSIS — R05 Cough: Secondary | ICD-10-CM | POA: Diagnosis not present

## 2015-07-15 DIAGNOSIS — R069 Unspecified abnormalities of breathing: Secondary | ICD-10-CM | POA: Diagnosis not present

## 2015-07-15 DIAGNOSIS — R5381 Other malaise: Secondary | ICD-10-CM | POA: Diagnosis not present

## 2015-07-15 DIAGNOSIS — R7989 Other specified abnormal findings of blood chemistry: Secondary | ICD-10-CM | POA: Diagnosis present

## 2015-07-15 DIAGNOSIS — I6200 Nontraumatic subdural hemorrhage, unspecified: Secondary | ICD-10-CM

## 2015-07-15 DIAGNOSIS — I639 Cerebral infarction, unspecified: Secondary | ICD-10-CM | POA: Diagnosis present

## 2015-07-15 LAB — INFLUENZA PANEL BY PCR (TYPE A & B)
H1N1FLUPCR: NOT DETECTED
INFLBPCR: NEGATIVE
Influenza A By PCR: NEGATIVE

## 2015-07-15 LAB — I-STAT TROPONIN, ED: Troponin i, poc: 0.07 ng/mL (ref 0.00–0.08)

## 2015-07-15 LAB — I-STAT ARTERIAL BLOOD GAS, ED
ACID-BASE DEFICIT: 2 mmol/L (ref 0.0–2.0)
Bicarbonate: 27.9 mEq/L — ABNORMAL HIGH (ref 20.0–24.0)
Bicarbonate: 23.1 mEq/L (ref 20.0–24.0)
O2 SAT: 99 %
O2 SAT: 98 %
PCO2 ART: 55.9 mmHg — AB (ref 35.0–45.0)
PH ART: 7.351 (ref 7.350–7.450)
PO2 ART: 168 mmHg — AB (ref 80.0–100.0)
PO2 ART: 116 mmHg — AB (ref 80.0–100.0)
Patient temperature: 100.1
TCO2: 30 mmol/L (ref 0–100)
TCO2: 24 mmol/L (ref 0–100)
pCO2 arterial: 41.7 mmHg (ref 35.0–45.0)
pH, Arterial: 7.31 — ABNORMAL LOW (ref 7.350–7.450)

## 2015-07-15 LAB — CBC WITH DIFFERENTIAL/PLATELET
BASOS PCT: 0 %
Basophils Absolute: 0 10*3/uL (ref 0.0–0.1)
EOS PCT: 0 %
Eosinophils Absolute: 0 10*3/uL (ref 0.0–0.7)
HEMATOCRIT: 40 % (ref 36.0–46.0)
Hemoglobin: 13.2 g/dL (ref 12.0–15.0)
Lymphocytes Relative: 5 %
Lymphs Abs: 0.5 10*3/uL — ABNORMAL LOW (ref 0.7–4.0)
MCH: 33.8 pg (ref 26.0–34.0)
MCHC: 33 g/dL (ref 30.0–36.0)
MCV: 102.3 fL — AB (ref 78.0–100.0)
MONO ABS: 0.5 10*3/uL (ref 0.1–1.0)
MONOS PCT: 5 %
NEUTROS ABS: 8.7 10*3/uL — AB (ref 1.7–7.7)
Neutrophils Relative %: 90 %
PLATELETS: 209 10*3/uL (ref 150–400)
RBC: 3.91 MIL/uL (ref 3.87–5.11)
RDW: 14.1 % (ref 11.5–15.5)
WBC: 9.8 10*3/uL (ref 4.0–10.5)

## 2015-07-15 LAB — PROTIME-INR
INR: 1.46 (ref 0.00–1.49)
Prothrombin Time: 17.8 seconds — ABNORMAL HIGH (ref 11.6–15.2)

## 2015-07-15 LAB — I-STAT CG4 LACTIC ACID, ED
LACTIC ACID, VENOUS: 1.49 mmol/L (ref 0.5–2.0)
LACTIC ACID, VENOUS: 3.03 mmol/L — AB (ref 0.5–2.0)
Lactic Acid, Venous: 2.74 mmol/L (ref 0.5–2.0)

## 2015-07-15 LAB — MRSA PCR SCREENING: MRSA BY PCR: NEGATIVE

## 2015-07-15 LAB — BRAIN NATRIURETIC PEPTIDE: B Natriuretic Peptide: 123.7 pg/mL — ABNORMAL HIGH (ref 0.0–100.0)

## 2015-07-15 LAB — BASIC METABOLIC PANEL
Anion gap: 9 (ref 5–15)
BUN: 14 mg/dL (ref 6–20)
CALCIUM: 8.3 mg/dL — AB (ref 8.9–10.3)
CO2: 26 mmol/L (ref 22–32)
CREATININE: 0.79 mg/dL (ref 0.44–1.00)
Chloride: 99 mmol/L — ABNORMAL LOW (ref 101–111)
GLUCOSE: 146 mg/dL — AB (ref 65–99)
Potassium: 4 mmol/L (ref 3.5–5.1)
Sodium: 134 mmol/L — ABNORMAL LOW (ref 135–145)

## 2015-07-15 LAB — TROPONIN I
TROPONIN I: 0.09 ng/mL — AB (ref <0.031)
TROPONIN I: 0.13 ng/mL — AB (ref <0.031)
Troponin I: 0.1 ng/mL — ABNORMAL HIGH (ref <0.031)

## 2015-07-15 LAB — LACTIC ACID, PLASMA
Lactic Acid, Venous: 2.7 mmol/L (ref 0.5–2.0)
Lactic Acid, Venous: 2 mmol/L (ref 0.5–2.0)

## 2015-07-15 LAB — APTT: aPTT: 35 seconds (ref 24–37)

## 2015-07-15 LAB — PROCALCITONIN: Procalcitonin: <0.1 ng/mL

## 2015-07-15 MED ORDER — LEVALBUTEROL HCL 0.63 MG/3ML IN NEBU
0.6300 mg | INHALATION_SOLUTION | Freq: Four times a day (QID) | RESPIRATORY_TRACT | Status: DC | PRN
  Administered 2015-07-16: 0.63 mg via RESPIRATORY_TRACT
  Filled 2015-07-15 (×2): qty 3

## 2015-07-15 MED ORDER — PIPERACILLIN-TAZOBACTAM 3.375 G IVPB
3.3750 g | Freq: Three times a day (TID) | INTRAVENOUS | Status: DC
  Administered 2015-07-15 – 2015-07-20 (×16): 3.375 g via INTRAVENOUS
  Filled 2015-07-15 (×19): qty 50

## 2015-07-15 MED ORDER — SENNOSIDES-DOCUSATE SODIUM 8.6-50 MG PO TABS: 1.0000 | ORAL_TABLET | Freq: Every evening | ORAL | Status: DC | PRN

## 2015-07-15 MED ORDER — LEVALBUTEROL HCL 0.63 MG/3ML IN NEBU
0.6300 mg | INHALATION_SOLUTION | Freq: Four times a day (QID) | RESPIRATORY_TRACT | Status: DC
  Administered 2015-07-15 (×2): 0.63 mg via RESPIRATORY_TRACT
  Filled 2015-07-15 (×5): qty 3

## 2015-07-15 MED ORDER — ALUM & MAG HYDROXIDE-SIMETH 200-200-20 MG/5ML PO SUSP
30.0000 mL | Freq: Four times a day (QID) | ORAL | Status: DC | PRN
  Filled 2015-07-15: qty 30

## 2015-07-15 MED ORDER — ACTIVE PARTNERSHIP FOR HEALTH OF YOUR HEART BOOK
Freq: Once | Status: AC
  Administered 2015-07-15: 22:00:00
  Filled 2015-07-15 (×2): qty 1

## 2015-07-15 MED ORDER — ACETAMINOPHEN 325 MG PO TABS
650.0000 mg | ORAL_TABLET | Freq: Four times a day (QID) | ORAL | Status: DC | PRN
  Administered 2015-07-16 – 2015-07-19 (×2): 650 mg via ORAL
  Filled 2015-07-15 (×2): qty 2

## 2015-07-15 MED ORDER — SERTRALINE HCL 50 MG PO TABS
50.0000 mg | ORAL_TABLET | Freq: Every day | ORAL | Status: DC
  Administered 2015-07-16 – 2015-07-20 (×5): 50 mg via ORAL
  Filled 2015-07-15 (×5): qty 1

## 2015-07-15 MED ORDER — ONDANSETRON HCL 4 MG PO TABS: 4.0000 mg | ORAL_TABLET | Freq: Four times a day (QID) | ORAL | Status: DC | PRN

## 2015-07-15 MED ORDER — LEVALBUTEROL HCL 0.63 MG/3ML IN NEBU: 0.6300 mg | INHALATION_SOLUTION | Freq: Two times a day (BID) | RESPIRATORY_TRACT | Status: DC

## 2015-07-15 MED ORDER — SODIUM CHLORIDE 0.9 % IV SOLN
INTRAVENOUS | Status: DC
  Administered 2015-07-15: 07:00:00 via INTRAVENOUS
  Administered 2015-07-17: 75 mL via INTRAVENOUS
  Administered 2015-07-17 – 2015-07-18 (×2): via INTRAVENOUS

## 2015-07-15 MED ORDER — VANCOMYCIN HCL IN DEXTROSE 1-5 GM/200ML-% IV SOLN
1000.0000 mg | Freq: Once | INTRAVENOUS | Status: AC
Start: 2015-07-15 — End: 2015-07-15
  Administered 2015-07-15: 1000 mg via INTRAVENOUS
  Filled 2015-07-15: qty 200

## 2015-07-15 MED ORDER — ACETAMINOPHEN 650 MG RE SUPP
650.0000 mg | Freq: Once | RECTAL | Status: AC
  Administered 2015-07-15: 650 mg via RECTAL
  Filled 2015-07-15: qty 1

## 2015-07-15 MED ORDER — SODIUM CHLORIDE 0.9 % IV BOLUS (SEPSIS)
1000.0000 mL | Freq: Once | INTRAVENOUS | Status: AC
  Administered 2015-07-15: 1000 mL via INTRAVENOUS

## 2015-07-15 MED ORDER — METHYLPREDNISOLONE SODIUM SUCC 125 MG IJ SOLR
60.0000 mg | Freq: Two times a day (BID) | INTRAMUSCULAR | Status: DC
  Administered 2015-07-15 – 2015-07-16 (×2): 60 mg via INTRAVENOUS
  Filled 2015-07-15 (×2): qty 2

## 2015-07-15 MED ORDER — B COMPLEX-C PO TABS
1.0000 | ORAL_TABLET | Freq: Every day | ORAL | Status: DC
  Administered 2015-07-16 – 2015-07-19 (×4): 1 via ORAL
  Filled 2015-07-15 (×5): qty 1

## 2015-07-15 MED ORDER — GUAIFENESIN ER 600 MG PO TB12
600.0000 mg | ORAL_TABLET | Freq: Two times a day (BID) | ORAL | Status: DC
  Administered 2015-07-15 – 2015-07-16 (×2): 600 mg via ORAL
  Filled 2015-07-15 (×2): qty 1

## 2015-07-15 MED ORDER — ONDANSETRON HCL 4 MG/2ML IJ SOLN
4.0000 mg | Freq: Four times a day (QID) | INTRAMUSCULAR | Status: DC | PRN
  Administered 2015-07-17: 4 mg via INTRAVENOUS
  Filled 2015-07-15: qty 2

## 2015-07-15 MED ORDER — SERTRALINE HCL 50 MG PO TABS: 50.0000 mg | ORAL_TABLET | Freq: Every day | ORAL | Status: DC

## 2015-07-15 MED ORDER — ALBUTEROL (5 MG/ML) CONTINUOUS INHALATION SOLN
10.0000 mg/h | INHALATION_SOLUTION | Freq: Once | RESPIRATORY_TRACT | Status: AC
  Administered 2015-07-15: 10 mg/h via RESPIRATORY_TRACT
  Filled 2015-07-15: qty 20

## 2015-07-15 MED ORDER — VANCOMYCIN HCL 500 MG IV SOLR
500.0000 mg | Freq: Two times a day (BID) | INTRAVENOUS | Status: DC
  Administered 2015-07-15 – 2015-07-17 (×5): 500 mg via INTRAVENOUS
  Filled 2015-07-15 (×8): qty 500

## 2015-07-15 MED ORDER — IPRATROPIUM BROMIDE 0.02 % IN SOLN
0.5000 mg | Freq: Once | RESPIRATORY_TRACT | Status: AC
  Administered 2015-07-15: 0.5 mg via RESPIRATORY_TRACT
  Filled 2015-07-15: qty 2.5

## 2015-07-15 MED ORDER — DILTIAZEM HCL ER COATED BEADS 180 MG PO CP24
180.0000 mg | ORAL_CAPSULE | Freq: Every day | ORAL | Status: DC
  Administered 2015-07-16 – 2015-07-20 (×5): 180 mg via ORAL
  Filled 2015-07-15 (×6): qty 1

## 2015-07-15 MED ORDER — ACETAMINOPHEN 650 MG RE SUPP: 650.0000 mg | Freq: Four times a day (QID) | RECTAL | Status: DC | PRN

## 2015-07-15 MED ORDER — SODIUM CHLORIDE 0.9 % IJ SOLN
3.0000 mL | Freq: Two times a day (BID) | INTRAMUSCULAR | Status: DC
  Administered 2015-07-15 – 2015-07-16 (×3): 3 mL via INTRAVENOUS
  Administered 2015-07-17: 10 mL via INTRAVENOUS
  Administered 2015-07-17 – 2015-07-19 (×3): 3 mL via INTRAVENOUS

## 2015-07-15 MED ORDER — APIXABAN 5 MG PO TABS
5.0000 mg | ORAL_TABLET | Freq: Two times a day (BID) | ORAL | Status: DC
  Administered 2015-07-15 – 2015-07-20 (×10): 5 mg via ORAL
  Filled 2015-07-15 (×10): qty 1

## 2015-07-15 MED ORDER — PIPERACILLIN-TAZOBACTAM 3.375 G IVPB 30 MIN
3.3750 g | Freq: Once | INTRAVENOUS | Status: AC
  Administered 2015-07-15: 3.375 g via INTRAVENOUS
  Filled 2015-07-15: qty 50

## 2015-07-15 NOTE — ED Notes (Signed)
Attempted report 

## 2015-07-15 NOTE — ED Notes (Signed)
Admitting team at bedside talking with pt and family

## 2015-07-15 NOTE — ED Notes (Signed)
Pt was given solumedrol 125 mg and 2 ntg. By ems

## 2015-07-15 NOTE — ED Notes (Signed)
Pt placed on bed pan but is unable to urinate at this time.

## 2015-07-15 NOTE — ED Notes (Addendum)
Pt here by ems for respiratory distress seen at PMD for same yesterday and diagnosed with anxiety and given rx for alprazolam, with ems pt had rales in [place] and started on cpap. Pt voices relief with cpap on arrival to ed.

## 2015-07-15 NOTE — Procedures (Signed)
RN removed pt from bipap machine and placed on [place].  After reviewing ABG results and assessing pt RT agreed to keep pt off bipap for pt's comfort.  RT and RN will continue to monitor pt and will place on bipap if pt has increase WOB or if any form of distress is noted.

## 2015-07-15 NOTE — Progress Notes (Signed)
Patient placed on BIPAP per MD order. Patient placed on 16/8 and 40%. Patient tolerating well sat 98%. ABG pending and Neb pending. RT will continue to monitor.

## 2015-07-15 NOTE — ED Provider Notes (Signed)
By signing my name below, I, Forrestine Him, attest that this documentation has been prepared under the direction and in the presence of [place], DO.  Electronically Signed: Forrestine Him, ED Scribe. 07/15/2015. 5:08 AM.   TIME SEEN: 5:01 AM   CHIEF COMPLAINT:  Chief Complaint  Patient presents with  . Respiratory Distress     HPI:  HPI Comments: MALANNI DEVOY brought in by EMS is a 82 y.o. female with a PMHx of atrial fibrillation on Eliquis, asthma and stroke who presents to the Emergency Department here for respiratory distress this evening. Pt states symptoms initially started 1 week ago but worsened in the last few days. She also reports intermittent, ongoing productive cough consisting of yellow phlegm. No interventions given en route to department. Denies any fever, chills, chest pain, nausea, or vomiting. Pt denies any history of asthma, COPD, or heart failure. Pt is not a current or previous smoker. EMS states she was evaluated yesterday at PCP office and diagnosed with panic attacks and started on Xanax. Does not wear oxygen at home.  PCP:  Melinda Crutch, MD    ROS: See HPI Constitutional: no fever  Eyes: no drainage  ENT: no runny nose   Cardiovascular:  no chest pain  Resp: Positive SOB and cough GI: no vomiting GU: no dysuria Integumentary: no rash  Allergy: no hives  Musculoskeletal: no leg swelling  Neurological: no slurred speech ROS otherwise negative  PAST MEDICAL HISTORY/PAST SURGICAL HISTORY:  Past Medical History  Diagnosis Date  . Allergy     allergic rhinitis  . Cancer [facility])     history of skin cancer of the nose  . Asthma 2000    mild. tried Advair after bronchospasm after exposure to cats  . Shoulder pain, left 05/2010    with rotator cuff tear--Dr Alfonso Ramus  . Paroxysmal atrial fibrillation (HCC)     pt does not want anticoagulation  . Aortic stenosis     EF normal.  Mild aortic stenosis. Mild to moderate aortic regurgitation  . Anxiety    . Shortness of breath     occ  . GERD (gastroesophageal reflux disease)     occ  . Arthritis   . Stroke [facility])     MEDICATIONS:  Prior to Admission medications   Medication Sig Start Date End Date Taking? Authorizing Provider  apixaban (ELIQUIS) 5 MG TABS tablet Take 1 tablet (5 mg total) by mouth 2 (two) times daily. 06/29/15   Jettie Booze, MD  B Complex-C (B-COMPLEX WITH VITAMIN C) tablet Take 1 tablet by mouth daily.    Historical Provider, MD  sertraline (ZOLOFT) 50 MG tablet Take 50 mg by mouth daily. 11/24/14   Historical Provider, MD    ALLERGIES:  Allergies  Allergen Reactions  . Tape Other (See Comments)    Rips skin    SOCIAL HISTORY:  Social History  Substance Use Topics  . Smoking status: Never Smoker   . Smokeless tobacco: Never Used     Comment: occ wine  . Alcohol Use: 0.0 oz/week    0 Standard drinks or equivalent per week     Comment: OCC    FAMILY HISTORY: Family History  Problem Relation Age of Onset  . Heart attack Neg Hx   . Stroke Sister   . Hypertension Neg Hx     EXAM: BP 104/83 mmHg  Pulse 97  Temp(Src) 100.1 F (37.8 C) (Rectal)  Resp 23  Ht 5\' 7"  (1.702 m)  Wt 140 lb (63.504 kg)  BMI 21.92 kg/m2  SpO2 97% CONSTITUTIONAL: Alert and oriented and responds appropriately to questions. Elderly and in mild to moderate respiratory distress HEAD: Normocephalic EYES: Conjunctivae clear, PERRL ENT: normal nose; no rhinorrhea; moist mucous membranes; pharynx without lesions noted NECK: Supple, no meningismus, no LAD  CARD: Regularly and tachycardic; S1 and S2 appreciated; no murmurs, no clicks, no rubs, no gallops RESP: tachypnea noted; hypoxic to 85% on room air, speaking in short sentences, diffuse rhonchi and scatter expiratory wheezes ABD/GI: Normal bowel sounds; non-distended; soft, non-tender, no rebound, no guarding, no peritoneal signs BACK:  The back appears normal and is non-tender to palpation, there is no CVA  tenderness EXT: Normal ROM in all joints; non-tender to palpation; no edema; normal capillary refill; no cyanosis, no calf tenderness or swelling    SKIN: Normal color for age and race; warm NEURO: Moves all extremities equally, sensation to light touch intact diffusely, cranial nerves II through XII intact PSYCH: The patient's mood and manner are appropriate. Grooming and personal hygiene are appropriate.  MEDICAL DECISION MAKING: Patient here with mild to moderate respiratory distress, new onset hypoxia with new oxygen requirement. Rectal temperature 100.1. Concern for possible pneumonia, flu. Differential also includes PE, CHF exacerbation. We'll obtain labs, cultures, chest x-ray. Patient is improving on BiPAP. We'll obtain ABG. Will give broad-spectrum antibiotics. She will need admission.  ED PROGRESS: Patient did have brief episodes of hypotension which improved with IV hydration. BNP is 123. Troponin negative. ABG shows mild respiratory acidosis. Chest x-ray shows no abnormality but suspect clinically she could have pneumonia. Flu swab pending. She is receiving vancomycin, Zosyn. Family updated with plan. Attempted to wean BiPAP as patient becomes tachypneic and tachycardic again. We'll continue BiPAP for her comfort.  8:30 AM  D/w Agustina Caroli NP with hospitalist service. She will see patient and admit her to step down.   Lactate was initially improving with IV hydration but is now back to 3. She has received 2 L of IV fluids. We'll give a third liter. Blood pressure has improved. Awaiting step down bed.    EKG Interpretation  Date/Time:  Saturday July 15 2015 05:05:51 EST Ventricular Rate:  134 PR Interval:    QRS Duration: 87 QT Interval:  313 QTC Calculation: 467 R Axis:   -70 Text Interpretation:  Atrial fibrillation Left anterior fascicular block Probable anteroseptal infarct, recent No significant change since last tracing Confirmed by WARD,  DO, KRISTEN [id number]) on  07/15/2015 6:02:30 AM        CRITICAL CARE Performed by: Nyra Jabs   Total critical care time: 45 minutes  Critical care time was exclusive of separately billable procedures and treating other patients.  Critical care was necessary to treat or prevent imminent or life-threatening deterioration.  Critical care was time spent personally by me on the following activities: development of treatment plan with patient and/or surrogate as well as nursing, discussions with consultants, evaluation of patient's response to treatment, examination of patient, obtaining history from patient or surrogate, ordering and performing treatments and interventions, ordering and review of laboratory studies, ordering and review of radiographic studies, pulse oximetry and re-evaluation of patient's condition.     I personally performed the services described in this documentation, which was scribed in my presence. The recorded information has been reviewed and is accurate.   [place], DO 07/15/15 1026

## 2015-07-15 NOTE — Progress Notes (Signed)
Patient stated she has not voided all day. Bladder scan read 858ml. Assisted patient to [facility] and patient voided 827ml amber colored clear urine. Urine sample sent to lab with orders in bag. Sample not labeled with sticker and lab stated they could not run the sample. Will again try for another urine sample.

## 2015-07-15 NOTE — Progress Notes (Signed)
ANTIBIOTIC CONSULT NOTE - INITIAL  Pharmacy Consult for vancomycin and Zosyn Indication: rule out sepsis/PNA  Allergies  Allergen Reactions  . Tape Other (See Comments)    Rips skin    Patient Measurements: Height: 5\' 7"  (170.2 cm) Weight: 140 lb (63.504 kg) IBW/kg (Calculated) : 61.6  Vital Signs: Temp: 100.1 F (37.8 C) (01/14 0518) Temp Source: Rectal (01/14 0518) BP: 91/60 mmHg (01/14 0945) Pulse Rate: 103 (01/14 0945) Intake/Output from previous day:   Intake/Output from this shift:    Labs:  Recent Labs  07/15/15 0628  WBC 9.8  HGB 13.2  PLT 209  CREATININE 0.79   Estimated Creatinine Clearance: 46.4 mL/min (by C-G formula based on Cr of 0.79). No results for input(s): VANCOTROUGH, VANCOPEAK, VANCORANDOM, GENTTROUGH, GENTPEAK, GENTRANDOM, TOBRATROUGH, TOBRAPEAK, TOBRARND, AMIKACINPEAK, AMIKACINTROU, AMIKACIN in the last 72 hours.   Microbiology: No results found for this or any previous visit (from the past 720 hour(s)).  Medical History: Past Medical History  Diagnosis Date  . Allergy     allergic rhinitis  . Cancer [facility])     history of skin cancer of the nose  . Asthma 2000    mild. tried Advair after bronchospasm after exposure to cats  . Shoulder pain, left 05/2010    with rotator cuff tear--Dr Alfonso Ramus  . Paroxysmal atrial fibrillation (HCC)     pt does not want anticoagulation  . Aortic stenosis     EF normal.  Mild aortic stenosis. Mild to moderate aortic regurgitation  . Anxiety   . Shortness of breath     occ  . GERD (gastroesophageal reflux disease)     occ  . Arthritis   . Stroke [facility])     Medications:   (Not in a hospital admission) Assessment: 89 yoF presenting w/ respiratory distress w/ intermittent productive cough. Febrile to 100.1 in ED, LA 3.03, CrCl 46, WBC 9.8. Pharmacy consulted to dose vanc/zosyn for sepsis/PNA r/o.  1/14 BCx x2 >> sent  Goal of Therapy:  Vancomycin trough level 15-20 mcg/ml  Plan:  -Vancomycin 1  g x1, then 500 mg IV q12h - Zosyn 3.375 g q8h (1st over 30 min) Expected duration 7 days with resolution of temperature and/or normalization of WBC Measure antibiotic drug levels at steady state Follow up culture results, LOT, renal fxn  Governor Specking, PharmD Clinical Pharmacy Resident Pager: [phone] 07/15/2015,11:15 AM

## 2015-07-15 NOTE — Progress Notes (Signed)
Utilization Review Completed.  

## 2015-07-15 NOTE — H&P (Signed)
[facility] History and Physical                                                                                    Caroline Myers, is a 82 y.o. female  MRN: [id number]   DOB - [birth year]  Admit Date - 07/15/2015  Outpatient Primary MD for the patient is  Melinda Crutch, MD  Referring Physician:  Dr. Leonides Schanz  Chief Complaint:   Chief Complaint  Patient presents with  . Respiratory Distress     HPI  Caroline Myers  is a 82 y.o. female, with atrial fibrillation on Eliquis, anxiety, aortic stenosis, and a history of 2 CVAs. She presents to the emergency department via EMS with severe sepsis, hypotension, tachypnea, tachycardia and fever. Per her husband she is normally ambulatory at home. She cannot walk far, but she does perform all of her own ADLs and does not use a cane or walker. Her family mentions that over the past week she has developed a cough with yellow phlegm. When her coughing is severe she has had chest pain. They have noticed her becoming more short of breath walking even short distances. She saw her primary care physician on 1/13. He gave her a nebulizer and prescribed Xanax for severe anxiety. Overnight she decompensated and developed respiratory distress. When EMS evaluated her her systolic blood pressure was in the 60s, and her respiratory rate was in the 40s.  Of note, the patient has been on 2 courses of prednisone recently for sciatica. I believe these ended approximately 2 weeks ago.  In the emergency department she has had a low-grade fever, 100.1, her lactic acid is 3.03 after 2 L of fluid, blood gas shows a PCO2 of 55.9, and a pH of 7.310, her blood pressure appears to be stabilizing with fluids - her systolic is now in the 0000000. Chest x-ray is clear, EKG shows atrial fibrillation.  Flu swab is negative.  Review of systems: Unable to obtain as the patient is lethargic and on BiPAP  Past Medical History  Past Medical History  Diagnosis Date  . Allergy     allergic  rhinitis  . Cancer [facility])     history of skin cancer of the nose  . Asthma 2000    mild. tried Advair after bronchospasm after exposure to cats  . Shoulder pain, left 05/2010    with rotator cuff tear--Dr Alfonso Ramus  . Paroxysmal atrial fibrillation (HCC)     pt does not want anticoagulation  . Aortic stenosis     EF normal.  Mild aortic stenosis. Mild to moderate aortic regurgitation  . Anxiety   . Shortness of breath     occ  . GERD (gastroesophageal reflux disease)     occ  . Arthritis   . Stroke [facility])     Past Surgical History  Procedure Laterality Date  . Hernia repair  2003  . Acromioplasty  2000    right shoulder, arthroscopic  . Lumbar laminectomy/decompression microdiscectomy  04/01/2012    Procedure: LUMBAR LAMINECTOMY/DECOMPRESSION MICRODISCECTOMY 1 LEVEL;  Surgeon: Eustace Moore, MD;  Location: [place] NEURO ORS;  Service: Neurosurgery;  Laterality: Right;  Right Lumbar four-five  extraforaminal microdiskectomy       Social History Social History  Substance Use Topics  . Smoking status: Never Smoker   . Smokeless tobacco: Never Used     Comment: occ wine  . Alcohol Use: 0.0 oz/week    0 Standard drinks or equivalent per week     Comment: OCC   she has never smoked. She drinks a glass or 2 of wine a month. She is independent with her ADLs and lives at home with her husband  Family History Family History  Problem Relation Age of Onset  . Heart attack Neg Hx   . Stroke Sister   . Hypertension Neg Hx     Prior to Admission medications   Medication Sig Start Date End Date Taking? Authorizing Provider  ALPRAZolam (XANAX) 0.25 MG tablet Take 0.25 mg by mouth 2 (two) times daily as needed for anxiety.  07/14/15  Yes Historical Provider, MD  apixaban (ELIQUIS) 5 MG TABS tablet Take 1 tablet (5 mg total) by mouth 2 (two) times daily. 06/29/15  Yes Jettie Booze, MD  B Complex-C (B-COMPLEX WITH VITAMIN C) tablet Take 1 tablet by mouth daily.   Yes Historical Provider,  MD  diltiazem (CARDIZEM CD) 180 MG 24 hr capsule Take 180 mg by mouth daily. 07/14/15  Yes Historical Provider, MD  PROAIR HFA 108 (90 Base) MCG/ACT inhaler Inhale 1-2 puffs into the lungs every 4 (four) hours as needed for wheezing or shortness of breath.  06/21/15  Yes Historical Provider, MD  sertraline (ZOLOFT) 50 MG tablet Take 50 mg by mouth daily. 11/24/14  Yes Historical Provider, MD    Allergies  Allergen Reactions  . Tape Other (See Comments)    Rips skin    Physical Exam  Vitals  Blood pressure 101/84, pulse 86, temperature 100.1 F (37.8 C), temperature source Rectal, resp. rate 23, height 5\' 7"  (1.702 m), weight 63.504 kg (140 lb), SpO2 100 %.   General:  Elderly female lying in bed, on BiPAP, opens eyes to my voice and answers short questions but quickly falls back to sleep  Psych:  Unable to assess due to lethargy and BiPAP  Neuro:   Unable to assess due to lethargy BiPAP  ENT:  Ears and Eyes appear Normal, Conjunctivae clear, PER.  Neck:  Supple, No lymphadenopathy appreciated  Respiratory:  Increased work of breathing, on BiPAP, I do not hear significant wheezes crackles or rales.  Cardiac: Irregularly irregular, tachycardic, systolic murmur  Abdomen:  Soft, slightly distended, no tenderness, no obvious masses. Positive bowel sounds  Skin:  Fragile skin, bruising of the medial portion of her left lower extremity near the ankle  Extremities:  Lower extremities are cold to touch, distal lower extremities are purplish, palpable pulses  Data Review  Wt Readings from Last 3 Encounters:  07/15/15 63.504 kg (140 lb)  05/30/15 64.864 kg (143 lb)  04/18/15 64.229 kg (141 lb 9.6 oz)    CBC  Recent Labs Lab 07/15/15 0628  WBC 9.8  HGB 13.2  HCT 40.0  PLT 209  MCV 102.3*  MCH 33.8  MCHC 33.0  RDW 14.1  LYMPHSABS 0.5*  MONOABS 0.5  EOSABS 0.0  BASOSABS 0.0    Chemistries   Recent Labs Lab 07/15/15 0628  NA 134*  K 4.0  CL 99*  CO2 26   GLUCOSE 146*  BUN 14  CREATININE 0.79  CALCIUM 8.3*        Lab Results  Component Value Date  HGBA1C 6.0* 02/14/2015    CREATININE: 0.79 (07/15/15 0628) Estimated creatinine clearance - 46.4 mL/min    Urinalysis    Component Value Date/Time   COLORURINE YELLOW 02/14/2015 0435   APPEARANCEUR CLOUDY* 02/14/2015 0435   LABSPEC 1.009 02/14/2015 0435   PHURINE 6.5 02/14/2015 0435   GLUCOSEU NEGATIVE 02/14/2015 0435   GLUCOSEU NEG mg/dL 10/09/2009 2126   HGBUR NEGATIVE 02/14/2015 0435   BILIRUBINUR NEGATIVE 02/14/2015 0435   KETONESUR NEGATIVE 02/14/2015 0435   PROTEINUR NEGATIVE 02/14/2015 0435   UROBILINOGEN 0.2 02/14/2015 0435   NITRITE NEGATIVE 02/14/2015 0435   LEUKOCYTESUR SMALL* 02/14/2015 0435    Imaging results:   Dg Chest Portable 1 View  07/15/2015  CLINICAL DATA:  82 year old female with shortness of breath EXAM: PORTABLE CHEST 1 VIEW COMPARISON:  Chest radiograph dated 02/14/2015 FINDINGS: Single-view of the chest demonstrates stable cardiomegaly. No focal consolidation, pleural effusion, or pneumothorax. Osteopenia with degenerative changes of the spine. No acute fracture. IMPRESSION: No active disease. Electronically Signed   By: Anner Crete M.D.   On: 07/15/2015 06:06    My personal review of EKG: Atrial fibrillation, QTC is 467  Assessment & Plan  Principal Problem:   Severe sepsis ([place]) Active Problems:   Bronchitis   Acute respiratory failure with hypercapnia (HCC)   Anxiety state   SAH (subarachnoid hemorrhage) (HCC)   SDH (subdural hematoma) (HCC)   Chronic atrial fibrillation (HCC)  Severe sepsis with acute respiratory failure and hypercapnia Uncertain etiology, but likely pulmonary-acute bronchitis Blood and urine cultures are pending. Lactic acid elevated at 3.03. Will follow. Check pro-calcitonin.  U/A still outstanding. Was significantly hypotensive on initial evaluation. Blood pressure has responded to fluids. Will monitor  closely. Family willing to except central line and pressors if necessary. Patient was started on Vanco and Zosyn in the emergency department area and we will continue these for now. Treat with Xopenex nebulizers, steroids, antibiotics, mucolytics, flutter valve, swallow eval.  Atrial fibrillation with Aortic Stenosis. Patients family reported some chest pain with cough.  Patient does not remember CP.  Will check 1 troponin. Patient's pulse rate is decreasing with IV fluids. Will continue by mouth Eliquis. Most recent echo was in 07/2013 - LVEF 55 - 60, LVH with mild AS. As family reports worsening DOE will recheck ECHO.  Anxiety Continue Zoloft.  Will hold Xanax for now as she is somewhat lethargic.  Hx of 2 CVA Continue eliquis.  Does not have residual effects per family.  Consultants Called:    none  Family Communication:     Husband and Dtr ([name]) at bedside.  Code Status:    DNR  Condition:    Guarded.  Potential Disposition:   Home vs rehab in 3-5 days with resolution of her infection.  Time spent in minutes : [place],  [place] on 07/15/2015 at 9:22 AM Between 7am to 7pm - Pager - [phone] After 7pm go to [email or web address] - password [email or web address] And look for the night coverage person covering me after hours

## 2015-07-16 ENCOUNTER — Inpatient Hospital Stay (HOSPITAL_COMMUNITY): Payer: Medicare Other

## 2015-07-16 DIAGNOSIS — R7989 Other specified abnormal findings of blood chemistry: Secondary | ICD-10-CM

## 2015-07-16 DIAGNOSIS — Z7901 Long term (current) use of anticoagulants: Secondary | ICD-10-CM

## 2015-07-16 DIAGNOSIS — A419 Sepsis, unspecified organism: Principal | ICD-10-CM

## 2015-07-16 DIAGNOSIS — R0600 Dyspnea, unspecified: Secondary | ICD-10-CM

## 2015-07-16 DIAGNOSIS — R6520 Severe sepsis without septic shock: Secondary | ICD-10-CM

## 2015-07-16 DIAGNOSIS — J40 Bronchitis, not specified as acute or chronic: Secondary | ICD-10-CM

## 2015-07-16 DIAGNOSIS — I959 Hypotension, unspecified: Secondary | ICD-10-CM | POA: Diagnosis present

## 2015-07-16 DIAGNOSIS — R778 Other specified abnormalities of plasma proteins: Secondary | ICD-10-CM | POA: Diagnosis present

## 2015-07-16 DIAGNOSIS — I359 Nonrheumatic aortic valve disorder, unspecified: Secondary | ICD-10-CM

## 2015-07-16 DIAGNOSIS — I482 Chronic atrial fibrillation: Secondary | ICD-10-CM

## 2015-07-16 DIAGNOSIS — J9602 Acute respiratory failure with hypercapnia: Secondary | ICD-10-CM

## 2015-07-16 DIAGNOSIS — J189 Pneumonia, unspecified organism: Secondary | ICD-10-CM

## 2015-07-16 LAB — URINALYSIS, ROUTINE W REFLEX MICROSCOPIC
Bilirubin Urine: NEGATIVE
GLUCOSE, UA: NEGATIVE mg/dL
HGB URINE DIPSTICK: NEGATIVE
Ketones, ur: NEGATIVE mg/dL
Leukocytes, UA: NEGATIVE
Nitrite: NEGATIVE
PH: 6 (ref 5.0–8.0)
Protein, ur: NEGATIVE mg/dL
SPECIFIC GRAVITY, URINE: 1.02 (ref 1.005–1.030)

## 2015-07-16 LAB — COMPREHENSIVE METABOLIC PANEL
ALK PHOS: 76 U/L (ref 38–126)
ALT: 23 U/L (ref 14–54)
AST: 53 U/L — AB (ref 15–41)
Albumin: 2.8 g/dL — ABNORMAL LOW (ref 3.5–5.0)
Anion gap: 9 (ref 5–15)
BILIRUBIN TOTAL: 0.7 mg/dL (ref 0.3–1.2)
BUN: 16 mg/dL (ref 6–20)
CHLORIDE: 103 mmol/L (ref 101–111)
CO2: 25 mmol/L (ref 22–32)
Calcium: 8.2 mg/dL — ABNORMAL LOW (ref 8.9–10.3)
Creatinine, Ser: 0.84 mg/dL (ref 0.44–1.00)
GFR, EST NON AFRICAN AMERICAN: 60 mL/min — AB (ref >60)
Glucose, Bld: 145 mg/dL — ABNORMAL HIGH (ref 65–99)
Potassium: 3.8 mmol/L (ref 3.5–5.1)
Sodium: 137 mmol/L (ref 135–145)
Total Protein: 5.9 g/dL — ABNORMAL LOW (ref 6.5–8.1)

## 2015-07-16 LAB — LACTIC ACID, PLASMA: LACTIC ACID, VENOUS: 1.1 mmol/L (ref 0.5–2.0)

## 2015-07-16 LAB — TROPONIN I
TROPONIN I: 0.11 ng/mL — AB (ref <0.031)
TROPONIN I: 0.12 ng/mL — AB (ref <0.031)
Troponin I: 0.12 ng/mL — ABNORMAL HIGH (ref <0.031)
Troponin I: 0.11 ng/mL — ABNORMAL HIGH (ref <0.031)

## 2015-07-16 MED ORDER — FLUTICASONE PROPIONATE 50 MCG/ACT NA SUSP
2.0000 | Freq: Every day | NASAL | Status: DC
  Administered 2015-07-16 – 2015-07-17 (×2): 2 via NASAL
  Filled 2015-07-16: qty 16

## 2015-07-16 MED ORDER — GUAIFENESIN-DM 100-10 MG/5ML PO SYRP
5.0000 mL | ORAL_SOLUTION | ORAL | Status: DC | PRN
  Administered 2015-07-16 – 2015-07-19 (×3): 5 mL via ORAL
  Filled 2015-07-16 (×3): qty 5

## 2015-07-16 MED ORDER — LEVALBUTEROL HCL 0.63 MG/3ML IN NEBU
0.6300 mg | INHALATION_SOLUTION | RESPIRATORY_TRACT | Status: DC | PRN
  Administered 2015-07-16 – 2015-07-19 (×4): 0.63 mg via RESPIRATORY_TRACT
  Filled 2015-07-16 (×3): qty 3

## 2015-07-16 MED ORDER — ALPRAZOLAM 0.25 MG PO TABS
0.2500 mg | ORAL_TABLET | Freq: Two times a day (BID) | ORAL | Status: DC | PRN
  Administered 2015-07-16 (×3): 0.25 mg via ORAL
  Filled 2015-07-16 (×3): qty 1

## 2015-07-16 MED ORDER — LEVALBUTEROL HCL 0.63 MG/3ML IN NEBU
0.6300 mg | INHALATION_SOLUTION | Freq: Three times a day (TID) | RESPIRATORY_TRACT | Status: DC
  Administered 2015-07-16 – 2015-07-20 (×13): 0.63 mg via RESPIRATORY_TRACT
  Filled 2015-07-16 (×14): qty 3

## 2015-07-16 MED ORDER — LORATADINE 10 MG PO TABS
10.0000 mg | ORAL_TABLET | Freq: Every day | ORAL | Status: DC
  Administered 2015-07-16 – 2015-07-20 (×5): 10 mg via ORAL
  Filled 2015-07-16 (×5): qty 1

## 2015-07-16 MED ORDER — ZOLPIDEM TARTRATE 5 MG PO TABS
5.0000 mg | ORAL_TABLET | Freq: Every evening | ORAL | Status: DC | PRN
  Administered 2015-07-19: 5 mg via ORAL
  Filled 2015-07-16: qty 1

## 2015-07-16 MED ORDER — GUAIFENESIN ER 600 MG PO TB12
1200.0000 mg | ORAL_TABLET | Freq: Two times a day (BID) | ORAL | Status: DC
  Administered 2015-07-16 – 2015-07-20 (×8): 1200 mg via ORAL
  Filled 2015-07-16 (×8): qty 2

## 2015-07-16 NOTE — Progress Notes (Signed)
[facility] PROGRESS NOTE  Caroline Myers [id number] DOB: July 30, 1923 DOA: 07/15/2015 PCP:  Melinda Crutch, MD  Assessment/Plan: #1 severe sepsis Unknown etiology. Patient presented with sepsis criteria with tachycardia, tachypnea, low-grade fever, lactic acidosis, hypotension with systolic blood pressure in the 60s with a respiratory acidosis. Initial ABG and placed on the BiPAP. Patient with some clinical improvement currently off BiPAP and on 2 L nasal cannula. Patient has been pancultured cultures are pending. Repeat chest x-ray this morning with questionable atelectasis versus infiltrate in the left lower lobe. Decrease IV fluids to 75 mL per hour. Continue empiric Vancomycin and IV Zosyn pending culture results. Follow.  #2 acute respiratory failure with hypoxia Patient had presented with acute respiratory failure with hypoxia and had upper respiratory symptoms prior to admission. Patient also with complaints of worsening chronic shortness of breath over the past several months. Patient with a history of mild aortic stenosis. Patient currently off BiPAP and on nasal cannula with good sats. Repeat chest x-ray this morning with questionable atelectasis versus infiltrate in the left lower lobe. Decrease IV fluids to 75 mL per hour. Stool Gram stain and culture pending. Check a urine Legionella antigen. Check a urine pneumococcus antigen. Cycle cardiac enzymes every 6 hours 3. Check a 2-D echo. Influenza PCR is negative. Continue empiric IV vancomycin, IV Zosyn. Increase Mucinex to 1200 mg twice daily, nebulizers. Continue IV Solu-Medrol. Add Flonase, Claritin. DC BiPAP. Follow.  #3 probable community-acquired pneumonia Per repeat chest x-ray this morning. Patient endorses upper respiratory symptoms over the past week. Patient had presented with acute respiratory failure with hypoxia and hypotension. Sputum Gram stain and culture pending. Check a urine Legionella antigen. Check a urine  pneumococcus antigen. Increase Mucinex to 1200 mg twice daily. Continue empiric IV vancomycin, IV Zosyn. Add Flonase, Claritin, nebulizers. Follow.  #4 hypotension Likely secondary to hypovolemia versus secondary to sepsis versus cardiac etiology. Blood pressure has responded to IV fluids. Decrease IV fluids to 75 mL per hour. Patient has been pancultured and results are pending. Repeat chest x-ray with questionable left lower lobe atelectasis versus infiltrate. Cardiac enzymes elevated. Continue to cycle cardiac enzymes. Check a 2-D echo. Repeat EKG. Continue empiric IV vancomycin, IV Zosyn, Mucinex. Follow.  #5 elevated troponins Questionable etiology. Patient had presented with a history of worsening shortness of breath over the past several months. Patient also noted to be hypotensive on admission. Patient stated had a chest heaviness this morning which has since improved. Patient with history of mild aortic stenosis. Continue to cycle cardiac enzymes. Repeat EKG. 2-D echo pending. Consult with cardiology for further evaluation and management. Continue home regimen of Cardizem. Decrease IV fluids to 75 mL per hour.  #6 mild aortic stenosis Patient states she's been having progressive worsening shortness of breath over the past several months. Repeat 2-D echo. Cardiology consult.  #7 lactic acidosis Likely secondary to problem #1 versus dehydration. Trending down. Patient has been pancultured. Continue empiric antibiotics.  #8 anxiety Continue Zoloft. Resume home dose xanax.  #9 history of atrial fibrillation Heart rate in the low 100s. Cardiac enzymes elevated. 2-D echo pending. Continue Cardizem for rate control. Continue eliquis.  #10 history of CVA Continue eliquis for secondary stroke prevention.  #11 hypertension Stable. Oral Cardizem.  #12 prophylaxis On eliquis for DVT prophylaxis.  Code Status: DO NOT RESUSCITATE Family Communication: Updated patient. They'll family at  bedside. Disposition Plan: Remain in step down unit today.   Consultants:  Cardiology pending  Procedures:  Chest x-ray on  14 2017, 07/16/2015  Antibiotics:  IV vancomycin 07/15/2015  IV Zosyn 07/15/2015  HPI/Subjective: Patient states had some chest heaviness this morning prior to chest x-ray which has since resolved. Patient denies any active shortness of breath however states has not gotten up to ambulate. Patient with no nausea no vomiting. Sitting up at bedside eating breakfast. Patient stated had upper respiratory symptoms over the past week.  Objective: Filed Vitals:   07/16/15 0800 07/16/15 0822  BP:  140/89  Pulse: 91   Temp:  97.3 F (36.3 C)  Resp: 22 22    Intake/Output Summary (Last 24 hours) at 07/16/15 0941 Last data filed at 07/16/15 0903  Gross per 24 hour  Intake   1070 ml  Output   1525 ml  Net   -455 ml   Filed Weights   07/15/15 0512 07/15/15 1847 07/16/15 0348  Weight: 63.504 kg (140 lb) 67.7 kg (149 lb 4 oz) 69.3 kg (152 lb 12.5 oz)    Exam:   General:  NAD  Cardiovascular: Irregularly irregular  Respiratory: CTAB. No wheezing. No crackles.  Abdomen: Soft, nontender, nondistended, positive bowel sounds.  Musculoskeletal: No clubbing cyanosis or edema.  Data Reviewed: Basic Metabolic Panel:  Recent Labs Lab 07/15/15 0628 07/16/15 0110  NA 134* 137  K 4.0 3.8  CL 99* 103  CO2 26 25  GLUCOSE 146* 145*  BUN 14 16  CREATININE 0.79 0.84  CALCIUM 8.3* 8.2*   Liver Function Tests:  Recent Labs Lab 07/16/15 0110  AST 53*  ALT 23  ALKPHOS 76  BILITOT 0.7  PROT 5.9*  ALBUMIN 2.8*   No results for input(s): LIPASE, AMYLASE in the last 168 hours. No results for input(s): AMMONIA in the last 168 hours. CBC:  Recent Labs Lab 07/15/15 0628  WBC 9.8  NEUTROABS 8.7*  HGB 13.2  HCT 40.0  MCV 102.3*  PLT 209   Cardiac Enzymes:  Recent Labs Lab 07/15/15 0943 07/15/15 1243 07/15/15 1942 07/16/15 0110  07/16/15 0820  TROPONINI 0.09* 0.10* 0.13* 0.11* 0.12*   BNP (last 3 results)  Recent Labs  07/15/15 0628  BNP 123.7*    ProBNP (last 3 results) No results for input(s): PROBNP in the last 8760 hours.  CBG: No results for input(s): GLUCAP in the last 168 hours.  Recent Results (from the past 240 hour(s))  MRSA PCR Screening     Status: None   Collection Time: 07/15/15  9:09 PM  Result Value Ref Range Status   MRSA by PCR NEGATIVE NEGATIVE Final    Comment:        The GeneXpert MRSA Assay (FDA approved for NASAL specimens only), is one component of a comprehensive MRSA colonization surveillance program. It is not intended to diagnose MRSA infection nor to guide or monitor treatment for MRSA infections.      Studies: Dg Chest Portable 1 View  07/15/2015  CLINICAL DATA:  82 year old female with shortness of breath EXAM: PORTABLE CHEST 1 VIEW COMPARISON:  Chest radiograph dated 02/14/2015 FINDINGS: Single-view of the chest demonstrates stable cardiomegaly. No focal consolidation, pleural effusion, or pneumothorax. Osteopenia with degenerative changes of the spine. No acute fracture. IMPRESSION: No active disease. Electronically Signed   By: Anner Crete M.D.   On: 07/15/2015 06:06    Scheduled Meds: . apixaban  5 mg Oral BID  . B-complex with vitamin C  1 tablet Oral Daily  . diltiazem  180 mg Oral Daily  . guaiFENesin  600 mg Oral BID  .  levalbuterol  0.63 mg Nebulization TID  . methylPREDNISolone (SOLU-MEDROL) injection  60 mg Intravenous Q12H  . piperacillin-tazobactam (ZOSYN)  IV  3.375 g Intravenous 3 times per day  . sertraline  50 mg Oral Daily  . sodium chloride  3 mL Intravenous Q12H  . vancomycin  500 mg Intravenous Q12H   Continuous Infusions: . sodium chloride 75 mL/hr at 07/16/15 [id number]    Principal Problem:   Severe sepsis (HCC) Active Problems:   Acute respiratory failure with hypercapnia (HCC)   Anxiety state   Atrial fibrillation (HCC)    Bronchitis   Aortic valve disorder   SAH (subarachnoid hemorrhage) (HCC)   SDH (subdural hematoma) (HCC)   Benign essential HTN   CVA (cerebral infarction)   Stroke [facility])   Chronic atrial fibrillation (HCC)   Chronic anticoagulation   Hypotension   Elevated troponin    Time spent: 40 mins    [facility] MD [facility] Pager [phone]. If 7PM-7AM, please contact night-coverage at [email or web address], password [facility] 07/16/2015, 9:41 AM  LOS: 1 day

## 2015-07-16 NOTE — Consult Note (Addendum)
CARDIOLOGY CONSULT NOTE   Patient ID: Caroline Myers MRN: [id number] DOB/AGE: 1924/01/17 82 y.o.  Admit Date: 07/15/2015 Referring Physician: TRH-Thompson MD Primary Physician:  Melinda Crutch, MD Consulting Cardiologist: Lyman Bishop MD Primary Cardiologist: Larae Grooms Reason for Consultation: Positive troponin  Clinical Summary Caroline Myers is a 82 y.o.female with known history of PAF, AoV stenosis, CVA X 2, on Eliquis with CHADS Score of 5, admitted with sepsis, hypotension, tachypnea, and fever. She had been having productive coughing with yellow phlegm and chest discomfort during coughing.   She states that she saw her PCP who placed her on diltiazem and xanax. She had worsening symptoms not allowing her to sleep and therefore called 911. She states she had been having increasing dyspnea over the last few years, now with very little exertion, -taking a shower- or walking very minimal distances.  On arrival to ER BP 144/109, HR 104, temp 100.1. R- 40. Lactic acid 2.7, with mildly elevated troponin of 0.09. WBC 9.8, MCV 102.3. Na 134, creatine 0.79, BNP 123.7. CXR revealed no CHF or pneumonia. EKG revealed atrial fib with RVR, non-specific T wave abnormalities rate of 134 bpm.   She was treated with albuterol neb tx.started on vancomycin and zosyn, IV hydration, and steroids. Subsequent troponin 0.11 and 0.12 respectively. We are asked for cardiology recommendations in the setting of this elevation. Repeat echo has been ordered by Dr. Grandville Silos.  Allergies  Allergen Reactions  . Tape Other (See Comments)    Rips skin    Medications Scheduled Medications: . apixaban  5 mg Oral BID  . B-complex with vitamin C  1 tablet Oral Daily  . diltiazem  180 mg Oral Daily  . fluticasone  2 spray Each Nare Daily  . guaiFENesin  1,200 mg Oral BID  . levalbuterol  0.63 mg Nebulization TID  . loratadine  10 mg Oral Daily  . methylPREDNISolone (SOLU-MEDROL) injection  60 mg  Intravenous Q12H  . piperacillin-tazobactam (ZOSYN)  IV  3.375 g Intravenous 3 times per day  . sertraline  50 mg Oral Daily  . sodium chloride  3 mL Intravenous Q12H  . vancomycin  500 mg Intravenous Q12H    Infusions: . sodium chloride 75 mL/hr at 07/16/15 0739    PRN Medications: acetaminophen **OR** acetaminophen, ALPRAZolam, alum & mag hydroxide-simeth, levalbuterol, ondansetron **OR** ondansetron (ZOFRAN) IV, senna-docusate   Past Medical History  Diagnosis Date  . Allergy     allergic rhinitis  . Cancer [facility])     history of skin cancer of the nose  . Asthma 2000    mild. tried Advair after bronchospasm after exposure to cats  . Shoulder pain, left 05/2010    with rotator cuff tear--Dr Alfonso Ramus  . Paroxysmal atrial fibrillation (HCC)     pt does not want anticoagulation  . Aortic stenosis     EF normal.  Mild aortic stenosis. Mild to moderate aortic regurgitation  . Anxiety   . Shortness of breath     occ  . GERD (gastroesophageal reflux disease)     occ  . Arthritis   . Stroke [facility])     Past Surgical History  Procedure Laterality Date  . Hernia repair  2003  . Acromioplasty  2000    right shoulder, arthroscopic  . Lumbar laminectomy/decompression microdiscectomy  04/01/2012    Procedure: LUMBAR LAMINECTOMY/DECOMPRESSION MICRODISCECTOMY 1 LEVEL;  Surgeon: Eustace Moore, MD;  Location: [place] NEURO ORS;  Service: Neurosurgery;  Laterality: Right;  Right Lumbar four-five  extraforaminal microdiskectomy     Family History  Problem Relation Age of Onset  . Heart attack Neg Hx   . Stroke Sister   . Hypertension Neg Hx     Social History Caroline Myers reports that she has never smoked. She has never used smokeless tobacco. Caroline Myers reports that she drinks alcohol.  Review of Systems Complete review of systems are found to be negative unless outlined in H&P above.  Physical Examination Blood pressure 122/75, pulse 91, temperature 97.9 F (36.6 C), temperature  source Oral, resp. rate 28, height 5' 5.5" (1.664 m), weight 152 lb 12.5 oz (69.3 kg), SpO2 97 %.  Intake/Output Summary (Last 24 hours) at 07/16/15 1121 Last data filed at 07/16/15 0903  Gross per 24 hour  Intake   1310 ml  Output   1525 ml  Net   -215 ml    Telemetry: Atrial fib rate in the 80's.   GEN:Ill appearing, with frequent coughing HEENT: Conjunctiva and lids normal, oropharynx clear with moist mucosa. Neck: Supple,  elevated JVP at 90 degrees, no carotid bruits, no thyromegaly. Lungs: Bilateral rales and frequent coughing. No wheezing.  Cardiac: Iregular rate and rhythm, no S3 , soft significant systolic murmur, no pericardial rub. Abdomen: Soft, nontender, no hepatomegaly, bowel sounds present, no guarding or rebound. Extremities: No pitting edema, distal pulses 2+. Skin: Warm and dry. Musculoskeletal: No kyphosis. Neuropsychiatric: Alert and oriented x3, affect grossly appropriate.  Prior Cardiac Testing/Procedures 1.Echocardiogram 07/27/2013  Left ventricle: The cavity size was normal. Wall thickness was increased in a pattern of moderate LVH. Systolic function was normal. The estimated ejection fraction was in the range of 55% to 60%. Wall motion was normal; there were no regional wall motion abnormalities. - Aortic valve: There was mild stenosis. Trivial regurgitation. Valve area: 1.64cm^2(VTI). Valve area: 1.57cm^2 (Vmax). - Right atrium: The atrium was mildly dilated.  Lab Results  Basic Metabolic Panel:  Recent Labs Lab 07/15/15 0628 07/16/15 0110  NA 134* 137  K 4.0 3.8  CL 99* 103  CO2 26 25  GLUCOSE 146* 145*  BUN 14 16  CREATININE 0.79 0.84  CALCIUM 8.3* 8.2*    Liver Function Tests:  Recent Labs Lab 07/16/15 0110  AST 53*  ALT 23  ALKPHOS 76  BILITOT 0.7  PROT 5.9*  ALBUMIN 2.8*    CBC:  Recent Labs Lab 07/15/15 0628  WBC 9.8  NEUTROABS 8.7*  HGB 13.2  HCT 40.0  MCV 102.3*  PLT 209    Cardiac  Enzymes:  Recent Labs Lab 07/15/15 0943 07/15/15 1243 07/15/15 1942 07/16/15 0110 07/16/15 0820  TROPONINI 0.09* 0.10* 0.13* 0.11* 0.12*    Radiology: Portable Chest 1 View  07/16/2015  CLINICAL DATA:  Pneumonia, history asthma, paroxysmal atrial fibrillation, stroke, GERD EXAM: PORTABLE CHEST 1 VIEW COMPARISON:  Portable exam 0541 hours compared to 07/15/2015 FINDINGS: RIGHT costophrenic angle excluded. Enlargement of cardiac silhouette. Atherosclerotic calcification aorta. Minimal pulmonary vascular congestion. Chronic central peribronchial thickening unchanged. Question atelectasis versus infiltrate in LEFT lower lobe. Remaining lungs clear. No gross pleural effusion or pneumothorax. Bones demineralized with chronic RIGHT rotator cuff tear. IMPRESSION: Enlargement of cardiac silhouette with pulmonary vascular congestion. Bronchitic changes with questionable infiltrate versus atelectasis in LEFT lower lobe. Electronically Signed   By: Lavonia Dana M.D.   On: 07/16/2015 09:40   Dg Chest Portable 1 View  07/15/2015  CLINICAL DATA:  82 year old female with shortness of breath EXAM: PORTABLE CHEST 1 VIEW COMPARISON:  Chest radiograph dated 02/14/2015  FINDINGS: Single-view of the chest demonstrates stable cardiomegaly. No focal consolidation, pleural effusion, or pneumothorax. Osteopenia with degenerative changes of the spine. No acute fracture. IMPRESSION: No active disease. Electronically Signed   By: Anner Crete M.D.   On: 07/15/2015 06:06   ECG: Atrial fib with RVR, non-specific T-wave abnormalities. Rate of 134 bpm.   Impression and Recommendations  1. Demand Ischemia: Likely related to tachypnea, febrile state and rapid HR. Troponin only mildly elevated. Echo is pending for evaluation for changes in LV fx. No evidence of ACS on EKG.   2. Atrial fib with RVR: Heart rate is better controlled. Was not on rate control medications in the past due to bradycardia, but recently placed on  this on 07/14/2015 by PCP when he evaluated her for dyspnea. This is continued during this admission. BP mildly hypotensive. Asymptomatic.   3. AoV stenosis: Mild per echo in 2015. Echo pending. Doubt causing her breathing issues. She has hx of Asthma. Never smoked.   4. Sepsis with Lower Respiratory Infection: Breathing status improved but no optimal. She remains on O2, and receiving breathing treatments and abx treatments. Currently afebrile.   Signed: Phill Myron. Lawrence NP [place]  07/16/2015, 11:21 AM Co-Sign MD  Agree with note by Angelena Form RNP  Pt of Dr Hassell Done with H/O CAF on Eliquis. Admitted for Rx of bronchitis. HR was approp elevated in setting of this but improved on PO Dilt. BNP low. CXR ? Bronchitic changes. WBC not elevated.Mildly elevated trop (flat curve) prob from demand ischemia.  Pt received steroids in ER and is on ATBX and bronchodilators. Still has some wheezes. 2D pending. Nothing further to add to her already excellent care. WIll follow with you.   Lorretta Harp, M.D., [place], [facility], [place] [address] [place], [place]  [address]  [phone] 07/16/2015 12:01 PM

## 2015-07-16 NOTE — Evaluation (Signed)
Clinical/Bedside Swallow Evaluation Patient Details  Name: Caroline Myers MRN: [id number] Date of Birth: [birth year]  Today's Date: 07/16/2015 Time: SLP Start Time (ACUTE ONLY): [id number] SLP Stop Time (ACUTE ONLY): 0918 SLP Time Calculation (min) (ACUTE ONLY): 14 min  Past Medical History:  Past Medical History  Diagnosis Date  . Allergy     allergic rhinitis  . Cancer [facility])     history of skin cancer of the nose  . Asthma 2000    mild. tried Advair after bronchospasm after exposure to cats  . Shoulder pain, left 05/2010    with rotator cuff tear--Dr Alfonso Ramus  . Paroxysmal atrial fibrillation (HCC)     pt does not want anticoagulation  . Aortic stenosis     EF normal.  Mild aortic stenosis. Mild to moderate aortic regurgitation  . Anxiety   . Shortness of breath     occ  . GERD (gastroesophageal reflux disease)     occ  . Arthritis   . Stroke [facility])    Past Surgical History:  Past Surgical History  Procedure Laterality Date  . Hernia repair  2003  . Acromioplasty  2000    right shoulder, arthroscopic  . Lumbar laminectomy/decompression microdiscectomy  04/01/2012    Procedure: LUMBAR LAMINECTOMY/DECOMPRESSION MICRODISCECTOMY 1 LEVEL;  Surgeon: Eustace Moore, MD;  Location: [place] NEURO ORS;  Service: Neurosurgery;  Laterality: Right;  Right Lumbar four-five extraforaminal microdiskectomy    HPI:  Pt is an 82 year old female admitted with severe sepsis with acute respiratory failure and hypercapnia Uncertain etiology, but likely pulmonary-acute bronchitis per MD. Pt has a history of CVA, no documented history of dysphagia.    Assessment / Plan / Recommendation Clinical Impression  Pt demosntrated normal swallow function with no overt signs of aspiration. The pt does have slightly dysphonic vocal quality with wet sound, consistent prior to and during PO intake. Recommend pt continue current diet with baisc aspiration precautions, which she follows independently. No SLP f/u needed,  will sign off.     Aspiration Risk  Mild aspiration risk    Diet Recommendation Regular;Thin liquid   Liquid Administration via: Cup;Straw Medication Administration: Whole meds with liquid Supervision: Patient able to self feed Postural Changes: Seated upright at 90 degrees    Other  Recommendations     Follow up Recommendations  None    Frequency and Duration            Prognosis        Swallow Study   General HPI: Pt is an 82 year old female admitted with severe sepsis with acute respiratory failure and hypercapnia Uncertain etiology, but likely pulmonary-acute bronchitis per MD. Pt has a history of CVA, no documented history of dysphagia.  Type of Study: Bedside Swallow Evaluation Previous Swallow Assessment: none Diet Prior to this Study: Regular;Thin liquids Temperature Spikes Noted: No Respiratory Status: Nasal cannula History of Recent Intubation: No Behavior/Cognition: Alert;Cooperative;Pleasant mood Oral Cavity Assessment: Within Functional Limits Oral Care Completed by SLP: No Oral Cavity - Dentition: Adequate natural dentition Vision: Functional for self-feeding Self-Feeding Abilities: Able to feed self Patient Positioning:  (edge of bed with meal) Baseline Vocal Quality: Wet (wet/dysphonic) Volitional Cough: Strong Volitional Swallow: Able to elicit    Oral/Motor/Sensory Function     Ice Chips     Thin Liquid Thin Liquid: Within functional limits Presentation: Straw;Self Fed    Nectar Thick Nectar Thick Liquid: Not tested   Honey Thick Honey Thick Liquid: Not tested   Puree  Puree: Within functional limits   Solid   GO   Solid: Within functional limits       [facility], MA CCC-SLP [phone]  Lynann Beaver 07/16/2015,9:22 AM

## 2015-07-16 NOTE — Progress Notes (Signed)
  Echocardiogram 2D Echocardiogram has been performed.  Caroline Myers 07/16/2015, 4:05 PM

## 2015-07-16 NOTE — Progress Notes (Signed)
PRN neb given for SOB. On arrival pt was in mild respiratory distress, RN noted and call RT to assess. Pt is now taking her neb and encourage to do some deep breathing and coughing exercise to keep her lung fields open and clear. RT will continue to monitor.

## 2015-07-16 NOTE — Progress Notes (Signed)
Urine sample sent to lab

## 2015-07-17 ENCOUNTER — Inpatient Hospital Stay (HOSPITAL_COMMUNITY): Payer: Medicare Other

## 2015-07-17 DIAGNOSIS — I959 Hypotension, unspecified: Secondary | ICD-10-CM | POA: Insufficient documentation

## 2015-07-17 DIAGNOSIS — I10 Essential (primary) hypertension: Secondary | ICD-10-CM

## 2015-07-17 DIAGNOSIS — D72829 Elevated white blood cell count, unspecified: Secondary | ICD-10-CM | POA: Clinically undetermined

## 2015-07-17 LAB — BASIC METABOLIC PANEL
ANION GAP: 8 (ref 5–15)
BUN: 21 mg/dL — AB (ref 6–20)
CALCIUM: 8.2 mg/dL — AB (ref 8.9–10.3)
CO2: 24 mmol/L (ref 22–32)
CREATININE: 0.87 mg/dL (ref 0.44–1.00)
Chloride: 106 mmol/L (ref 101–111)
GFR calc Af Amer: >60 mL/min (ref >60)
GFR, EST NON AFRICAN AMERICAN: 57 mL/min — AB (ref >60)
GLUCOSE: 135 mg/dL — AB (ref 65–99)
Potassium: 3.7 mmol/L (ref 3.5–5.1)
Sodium: 138 mmol/L (ref 135–145)

## 2015-07-17 LAB — CBC
HCT: 41.2 % (ref 36.0–46.0)
Hemoglobin: 13.4 g/dL (ref 12.0–15.0)
MCH: 33.1 pg (ref 26.0–34.0)
MCHC: 32.5 g/dL (ref 30.0–36.0)
MCV: 101.7 fL — ABNORMAL HIGH (ref 78.0–100.0)
PLATELETS: 222 10*3/uL (ref 150–400)
RBC: 4.05 MIL/uL (ref 3.87–5.11)
RDW: 14.2 % (ref 11.5–15.5)
WBC: 19.7 10*3/uL — ABNORMAL HIGH (ref 4.0–10.5)

## 2015-07-17 LAB — URINE CULTURE: CULTURE: NO GROWTH

## 2015-07-17 LAB — HIV ANTIBODY (ROUTINE TESTING W REFLEX): HIV Screen 4th Generation wRfx: NONREACTIVE

## 2015-07-17 MED ORDER — METHYLPREDNISOLONE SODIUM SUCC 125 MG IJ SOLR
60.0000 mg | Freq: Three times a day (TID) | INTRAMUSCULAR | Status: DC
  Administered 2015-07-17 – 2015-07-18 (×4): 60 mg via INTRAVENOUS
  Filled 2015-07-17 (×4): qty 2

## 2015-07-17 MED ORDER — BUDESONIDE 0.25 MG/2ML IN SUSP
0.2500 mg | Freq: Two times a day (BID) | RESPIRATORY_TRACT | Status: DC
  Administered 2015-07-17 – 2015-07-20 (×7): 0.25 mg via RESPIRATORY_TRACT
  Filled 2015-07-17 (×8): qty 2

## 2015-07-17 MED ORDER — ARFORMOTEROL TARTRATE 15 MCG/2ML IN NEBU
15.0000 ug | INHALATION_SOLUTION | Freq: Two times a day (BID) | RESPIRATORY_TRACT | Status: DC
  Administered 2015-07-17 – 2015-07-20 (×7): 15 ug via RESPIRATORY_TRACT
  Filled 2015-07-17 (×8): qty 2

## 2015-07-17 MED ORDER — CETYLPYRIDINIUM CHLORIDE 0.05 % MT LIQD
7.0000 mL | Freq: Two times a day (BID) | OROMUCOSAL | Status: DC
  Administered 2015-07-17 – 2015-07-20 (×6): 7 mL via OROMUCOSAL

## 2015-07-17 NOTE — Evaluation (Signed)
Physical Therapy Evaluation Patient Details Name: Caroline Myers MRN: [id number] DOB: 1924/03/23 Today's Date: 07/17/2015   History of Present Illness  [name] is a 82 y.o.female with known history of PAF, AoV stenosis, CVA X 2, on Eliquis with CHADS Score of 5, admitted with sepsis, hypotension, tachypnea, and fever. She had been having productive coughing with yellow phlegm and chest discomfort during coughing. Hallucinations as well.   Clinical Impression  Pt admitted with above diagnosis. Pt currently with functional limitations due to the deficits listed below (see PT Problem List). Pt will need 24 hour care at present and daughter already assisting pt husband.  Would benefit from Rehab to reach  I level to return home and be able to care for herself.  Will follow acutely.  Pt will benefit from skilled PT to increase their independence and safety with mobility to allow discharge to the venue listed below.      Follow Up Recommendations CIR;Supervision/Assistance - 24 hour    Equipment Recommendations  Other (comment) (TBA)    Recommendations for Other Services Rehab consult     Precautions / Restrictions Precautions Precautions: Fall Restrictions Weight Bearing Restrictions: No      Mobility  Bed Mobility               General bed mobility comments: pt in recliner on arrival  Transfers Overall transfer level: Needs assistance Equipment used: 2 person hand held assist Transfers: Sit to/from Stand Sit to Stand: Mod assist;+2 physical assistance         General transfer comment: Pt needed assist to power up.  Pt having difficulty with anterior lean due to posterior bias.  Pt with bil knee instability and having difficulty with steadying herself needing mod assist for steadying.    Ambulation/Gait Ambulation/Gait assistance: Min assist;Mod assist;+2 physical assistance Ambulation Distance (Feet): 2 Feet Assistive device: 2 person hand held assist Gait  Pattern/deviations: Decreased stride length;Step-to pattern;Ataxic;Leaning posteriorly   Gait velocity interpretation: Below normal speed for age/gender General Gait Details: Pt marched in place and was weight shifting but once pt tried to take a step forward, unable due to unsteady on feet even with +2 person assist.    Stairs            Wheelchair Mobility    Modified Rankin (Stroke Patients Only)       Balance Overall balance assessment: Needs assistance;History of Falls Sitting-balance support: No upper extremity supported;Feet supported Sitting balance-Leahy Scale: Poor Sitting balance - Comments: needs UE support to balance in sitting.  Postural control: Posterior lean Standing balance support: Bilateral upper extremity supported;During functional activity Standing balance-Leahy Scale: Poor Standing balance comment: relies on UE support for static standing balance.                             Pertinent Vitals/Pain Pain Assessment: No/denies pain  VSS with sats 94% on 3LO2 with activity.  Other VSS.    Home Living Family/patient expects to be discharged to:: Private residence Living Arrangements: Spouse/significant other Available Help at Discharge: Family;Available 24 hours/day (Daughter is staying with husband as pt usually A him) Type of Home: House Home Access: Stairs to enter Entrance Stairs-Rails: None Entrance Stairs-Number of Steps: 1 brick step Home Layout: One level Home Equipment: Environmental consultant - 2 wheels;Cane - single point;Grab bars - tub/shower Additional Comments: pt ambulates without assistive device and husband uses a cane.  Has had several falls per pt.  Prior Function Level of Independence: Independent               Hand Dominance   Dominant Hand: Right    Extremity/Trunk Assessment   Upper Extremity Assessment: Defer to OT evaluation           Lower Extremity Assessment: RLE deficits/detail;LLE deficits/detail RLE  Deficits / Details: grossly 3-/5 LLE Deficits / Details: grossly 3-/5  Cervical / Trunk Assessment: Kyphotic  Communication   Communication: No difficulties  Cognition Arousal/Alertness: Awake/alert Behavior During Therapy: Anxious Overall Cognitive Status: Impaired/Different from baseline Area of Impairment: Safety/judgement;Awareness;Problem solving;Orientation Orientation Level: Place;Time;Situation       Safety/Judgement: Decreased awareness of safety;Decreased awareness of deficits Awareness: Intellectual Problem Solving: Difficulty sequencing;Requires verbal cues;Requires tactile cues General Comments: Pt intermittently hallucinating.    General Comments      Exercises General Exercises - Lower Extremity Ankle Circles/Pumps: AROM;Both;10 reps;Supine Long Arc Quad: AROM;Both;10 reps;Seated Hip Flexion/Marching: AROM;Both;10 reps;Seated      Assessment/Plan    PT Assessment Patient needs continued PT services  PT Diagnosis Generalized weakness   PT Problem List Decreased balance;Decreased activity tolerance;Decreased mobility;Decreased knowledge of use of DME;Decreased safety awareness;Decreased knowledge of precautions  PT Treatment Interventions DME instruction;Gait training;Functional mobility training;Therapeutic activities;Therapeutic exercise;Balance training;Patient/family education   PT Goals (Current goals can be found in the Care Plan section) Acute Rehab PT Goals Patient Stated Goal: to get better PT Goal Formulation: With patient Time For Goal Achievement: 07/31/15 Potential to Achieve Goals: Good    Frequency Min 3X/week   Barriers to discharge Decreased caregiver support (daughter assisting pt husband/can assist after rehab )      Co-evaluation               End of Session Equipment Utilized During Treatment: Gait belt;Oxygen Activity Tolerance: Patient limited by fatigue Patient left: in chair;with call bell/phone within reach;with chair  alarm set;with nursing/sitter in room Nurse Communication: Mobility status         Time: 1214-1228 PT Time Calculation (min) (ACUTE ONLY): 14 min   Charges:   PT Evaluation $PT Eval Moderate Complexity: 1 Procedure     PT G CodesDenice Myers 2015/08/04, 3:58 PM [facility] Acute Rehabilitation [phone] (pager)

## 2015-07-17 NOTE — Progress Notes (Signed)
Pt sleeping and not able to do 1600 CPT.

## 2015-07-17 NOTE — Progress Notes (Signed)
Inpatient Rehabilitation  Patient was screened by Donato Studley for appropriateness for an Inpatient Acute Rehab consult.  At this time, we are recommending Inpatient Rehab consult.  Please order when you feel appropriate.   Quin Mathenia PT Inpatient Rehab Admissions Coordinator Cell [phone] Office [phone]   

## 2015-07-17 NOTE — Progress Notes (Signed)
"  Oh my goss I feel terrible, What is wrong with me." Patient stated she was so confused and not able to put anything together of where she was and what was wrong with her. I explained she took her Xanax tonight and she stated she never would take another one. Patient talking in her sleep and not able to relax. Up and down OOB. Sitter needed for safety. Vital signs remain stable. Patient remains in Afib with good rate control. Monitoring closely.

## 2015-07-17 NOTE — Progress Notes (Addendum)
SUBJECTIVE:  No complaints  OBJECTIVE:   Vitals:   Filed Vitals:   07/17/15 0400 07/17/15 0750 07/17/15 0828 07/17/15 0952  BP:  108/87 121/80   Pulse:  70 92 84  Temp:  97.3 F (36.3 C)    TempSrc:  Axillary    Resp: 19 17 22 23   Height:      Weight:      SpO2:  96% 95% 99%   I&O's:   Intake/Output Summary (Last 24 hours) at 07/17/15 1058 Last data filed at 07/17/15 0900  Gross per 24 hour  Intake 1908.75 ml  Output    200 ml  Net 1708.75 ml   TELEMETRY: Reviewed telemetry pt in atrial fibrillation with CVR     PHYSICAL EXAM General: Well developed, well nourished, in no acute distress Head: Eyes PERRLA, No xanthomas.   Normal cephalic and atramatic  Lungs:  Few scattered wheezes Heart:   Irregularly irregular S1 S2 Pulses are 2+ & equal. Abdomen: Bowel sounds are positive, abdomen soft and non-tender without masses Extremities:   No clubbing, cyanosis or edema.  DP +1 Neuro: Alert and oriented X 3. Psych:  Good affect, responds appropriately   LABS: Basic Metabolic Panel:  Recent Labs  07/16/15 0110 07/17/15 0232  NA 137 138  K 3.8 3.7  CL 103 106  CO2 25 24  GLUCOSE 145* 135*  BUN 16 21*  CREATININE 0.84 0.87  CALCIUM 8.2* 8.2*   Liver Function Tests:  Recent Labs  07/16/15 0110  AST 53*  ALT 23  ALKPHOS 76  BILITOT 0.7  PROT 5.9*  ALBUMIN 2.8*   No results for input(s): LIPASE, AMYLASE in the last 72 hours. CBC:  Recent Labs  07/15/15 0628 07/17/15 0232  WBC 9.8 19.7*  NEUTROABS 8.7*  --   HGB 13.2 13.4  HCT 40.0 41.2  MCV 102.3* 101.7*  PLT 209 222   Cardiac Enzymes:  Recent Labs  07/16/15 0820 07/16/15 1355 07/16/15 1857  TROPONINI 0.12* 0.12* 0.11*   BNP: Invalid input(s): POCBNP D-Dimer: No results for input(s): DDIMER in the last 72 hours. Hemoglobin A1C: No results for input(s): HGBA1C in the last 72 hours. Fasting Lipid Panel: No results for input(s): CHOL, HDL, LDLCALC, TRIG, CHOLHDL, LDLDIRECT in the  last 72 hours. Thyroid Function Tests: No results for input(s): TSH, T4TOTAL, T3FREE, THYROIDAB in the last 72 hours.  Invalid input(s): FREET3 Anemia Panel: No results for input(s): VITAMINB12, FOLATE, FERRITIN, TIBC, IRON, RETICCTPCT in the last 72 hours. Coag Panel:   Lab Results  Component Value Date   INR 1.46 07/15/2015   INR 1.10 02/14/2015   INR 1.39 01/02/2015    RADIOLOGY: Dg Chest Port 1 View  07/17/2015  CLINICAL DATA:  Shortness of breath EXAM: PORTABLE CHEST 1 VIEW COMPARISON:  07/16/2015 FINDINGS: Cardiomegaly. Small bilateral pleural effusions with bibasilar atelectasis. No overt edema. No acute bony abnormality. Degenerative changes in the shoulders and thoracic spine. Rightward scoliosis of the thoracic spine. IMPRESSION: Cardiomegaly. Small bilateral pleural effusions with bibasilar atelectasis. Electronically Signed   By: Rolm Baptise M.D.   On: 07/17/2015 10:18   Portable Chest 1 View  07/16/2015  CLINICAL DATA:  Pneumonia, history asthma, paroxysmal atrial fibrillation, stroke, GERD EXAM: PORTABLE CHEST 1 VIEW COMPARISON:  Portable exam 0541 hours compared to 07/15/2015 FINDINGS: RIGHT costophrenic angle excluded. Enlargement of cardiac silhouette. Atherosclerotic calcification aorta. Minimal pulmonary vascular congestion. Chronic central peribronchial thickening unchanged. Question atelectasis versus infiltrate in LEFT lower lobe. Remaining lungs clear.  No gross pleural effusion or pneumothorax. Bones demineralized with chronic RIGHT rotator cuff tear. IMPRESSION: Enlargement of cardiac silhouette with pulmonary vascular congestion. Bronchitic changes with questionable infiltrate versus atelectasis in LEFT lower lobe. Electronically Signed   By: Lavonia Dana M.D.   On: 07/16/2015 09:40   Dg Chest Portable 1 View  07/15/2015  CLINICAL DATA:  82 year old female with shortness of breath EXAM: PORTABLE CHEST 1 VIEW COMPARISON:  Chest radiograph dated 02/14/2015 FINDINGS:  Single-view of the chest demonstrates stable cardiomegaly. No focal consolidation, pleural effusion, or pneumothorax. Osteopenia with degenerative changes of the spine. No acute fracture. IMPRESSION: No active disease. Electronically Signed   By: Anner Crete M.D.   On: 07/15/2015 06:06    Impression and Recommendations  1. Elevated Troponin: Likely related to demand ischemia from tachypnea, febrile state and rapid HR. Troponin only mildly elevated and flat trend. Echo shows low normal  LV fx with EF 50-55%. No evidence of ACS on EKG. Cath in 2008 with normal coronary arteries.  No further cardiac ischemic workup recommended at this time.  2. Chronic Atrial fib with RVR: Heart rate is better. Continue Cardizem for rate control and Eliquis.  3. AoV stenosis: Mild per echo.  Doubt causing her breathing issues. She has hx of Asthma. Never smoked.   4. Sepsis with Lower Respiratory Infection: Breathing status improved but no optimal. She remains on O2, and receiving breathing treatments and abx treatments. Currently afebrile.   5.  Moderate MR by echo with increased filling pressures but BNP minimally elevated.  CXRAY with small B/L pleural effusions but no frank edema.    6.  Moderate pulmonary HTN on echo most likely secondary to acute respiratory failure with hypoxia.    7.  Severe sepsis - per IM  No new recs at this time. Will sign off.  Call with any questions.    Sueanne Margarita, MD  07/17/2015  10:58 AM

## 2015-07-17 NOTE — Progress Notes (Signed)
Patient hallucinating "I see swinging doors. Watch out. Who is there. Honey is that you. [name]? " Patient confused about everything. Xanax given PO for agitation before hallucinations started. RN sitting in room for patients safety. Getting up OOB and reaching for things that aren't there. "Where am I" "Help me"  Side rails up and patient unable to relax. Monitoring closely.

## 2015-07-17 NOTE — Discharge Instructions (Signed)

## 2015-07-17 NOTE — Progress Notes (Signed)
[facility] PROGRESS NOTE  Caroline Myers [id number] DOB: 21-Aug-1923 DOA: 07/15/2015 PCP:  Melinda Crutch, MD  Assessment/Plan: #1 severe sepsis Unknown etiology. Patient presented with sepsis criteria with tachycardia, tachypnea, low-grade fever, lactic acidosis, hypotension with systolic blood pressure in the 60s with a respiratory acidosis. Initial ABG and placed on the BiPAP. Patient has been pancultured cultures are pending. Repeat chest x-ray on 07/16/2015 with questionable atelectasis versus infiltrate in the left lower lobe. Decreased IV fluids to 75 mL per hour. Continue empiric Vancomycin and IV Zosyn pending culture results. Follow.  #2 acute respiratory failure with hypoxia Patient had presented with acute respiratory failure with hypoxia and had upper respiratory symptoms prior to admission. Patient also with complaints of worsening chronic shortness of breath over the past several months. Patient with a history of mild aortic stenosis. Patient currently off BiPAP and on nasal cannula, however use of some accessory muscles of respiration. Patient did not get her IV Solu-Medrol last night. Repeat chest x-ray  07/16/2015 with questionable atelectasis versus infiltrate in the left lower lobe. Decreased IV fluids to 75 mL per hour. Stool Gram stain and culture pending. Urine Legionella antigen pending. Urine pneumococcus antigen pending. 2-D echo with EF of 50-55% with incoordinated in septal motion, mild aortic stenosis, moderate pulmonary hypertension. Influenza PCR is negative. Change Solu-Medrol to 60 mg IV every 8 hours. Add Pulmicort and Brovana.Continue empiric IV vancomycin, IV Zosyn, Mucinex, nebulizers, Flonase, Claritin. Repeat chest x-ray. BiPAP as needed. Follow.  #3 probable community-acquired pneumonia Per repeat chest x-ray this morning. Patient endorses upper respiratory symptoms over the past week. Patient had presented with acute respiratory failure with hypoxia and  hypotension. Sputum Gram stain and culture pending. Urine Legionella antigen pending. Urine pneumococcus antigen pending. Mucinex to 1200 mg twice daily. Continue empiric IV vancomycin, IV Zosyn, Flonase, Claritin, nebulizers. Follow.  #4 hypotension Likely secondary to hypovolemia versus secondary to sepsis versus cardiac etiology. Blood pressure has responded to IV fluids. Continue IV fluids at  75 mL per hour. Patient has been pancultured and results are pending. Repeat chest x-ray with questionable left lower lobe atelectasis versus infiltrate. Cardiac enzymes elevated.  2-D echo with EF of 50-55% with incoordinated in septal motion, mild aortic stenosis, moderate pulmonary hypertension. Continue empiric IV vancomycin, IV Zosyn, Mucinex. Follow.  #5 elevated troponins Questionable etiology. Per cardiology likely a demand ischemia. Patient had presented with a history of worsening shortness of breath over the past several months. Patient also noted to be hypotensive on admission. Patient stated had a chest heaviness this morning which has since improved. Patient with history of mild aortic stenosis. Cardiac enzymes seem to have plateaued. 2-D echo with EF of 50-55% with a coordinated septal motion, mild aortic stenosis, moderate pulmonary hypertension. Continue home regimen of Cardizem. Continue gentle hydration. Cardiology following.   #6 mild aortic stenosis Patient states she's been having progressive worsening shortness of breath over the past several months. Repeat 2-D echo with EF of 50-55% with incoordinate in septal motion, mild aortic stenosis, moderate pulmonary hypertension. Cardiology following  #7 lactic acidosis Likely secondary to problem #1 versus dehydration. Trending down. Patient has been pancultured. Continue empiric antibiotics.  #8 anxiety Continue Zoloft. Xanax was discontinued yesterday as patient was noted to be hallucinating. Hallucinations improved.   #9 history of  atrial fibrillation Heart rate in the 80s. Cardiac enzymes elevated. 2-D echo with EF of 50-55% with incoordinate in septal motion. Mild aortic stenosis. Moderate pulmonary hypertension. Continue Cardizem for rate control.  Continue eliquis.  #10 history of CVA Continue eliquis for secondary stroke prevention.  #11 hypertenswith an coordinate septal  #12 pulmonary hypertension  #13 leukocytosis Maybe secondary to pneumonia and IV steroids. Patient has been pancultured results pending. Continue empiric IV vancomycin and IV Zosyn. Follow.   #12 prophylaxis On eliquis for DVT prophylaxis.  Code Status: DO NOT RESUSCITATE Family Communication: Updated patient. No family at bedside. Disposition Plan: Remain in step down unit today.   Consultants:  Cardiology: Dr. Gwenlyn Found 07/16/2015  Procedures:  Chest x-ray on 14 2017, 07/16/2015  2-D echo 07/16/2015  Antibiotics:  IV vancomycin 07/15/2015  IV Zosyn 07/15/2015  HPI/Subjective: Patient with use of some accessory muscles of respiration. Sitting up eating breakfast. Events overnight noted. Patient alert and ff commands.  Objective: Filed Vitals:   07/17/15 0750 07/17/15 0828  BP: 108/87 121/80  Pulse: 70 92  Temp: 97.3 F (36.3 C)   Resp: 17 22    Intake/Output Summary (Last 24 hours) at 07/17/15 0850 Last data filed at 07/17/15 0023  Gross per 24 hour  Intake 1668.75 ml  Output    400 ml  Net 1268.75 ml   Filed Weights   07/15/15 0512 07/15/15 1847 07/16/15 0348  Weight: 63.504 kg (140 lb) 67.7 kg (149 lb 4 oz) 69.3 kg (152 lb 12.5 oz)    Exam:   General:  Some use of accessory muscles of respiration.  Cardiovascular: Irregularly irregular  Respiratory: Some use of accessory muscles of respiration. Expiratory wheezing. No crackles.  Abdomen: Soft, nontender, nondistended, positive bowel sounds.  Musculoskeletal: No clubbing cyanosis or edema.  Data Reviewed: Basic Metabolic Panel:  Recent Labs Lab  07/15/15 0628 07/16/15 0110 07/17/15 0232  NA 134* 137 138  K 4.0 3.8 3.7  CL 99* 103 106  CO2 26 25 24   GLUCOSE 146* 145* 135*  BUN 14 16 21*  CREATININE 0.79 0.84 0.87  CALCIUM 8.3* 8.2* 8.2*   Liver Function Tests:  Recent Labs Lab 07/16/15 0110  AST 53*  ALT 23  ALKPHOS 76  BILITOT 0.7  PROT 5.9*  ALBUMIN 2.8*   No results for input(s): LIPASE, AMYLASE in the last 168 hours. No results for input(s): AMMONIA in the last 168 hours. CBC:  Recent Labs Lab 07/15/15 0628 07/17/15 0232  WBC 9.8 19.7*  NEUTROABS 8.7*  --   HGB 13.2 13.4  HCT 40.0 41.2  MCV 102.3* 101.7*  PLT 209 222   Cardiac Enzymes:  Recent Labs Lab 07/15/15 1942 07/16/15 0110 07/16/15 0820 07/16/15 1355 07/16/15 1857  TROPONINI 0.13* 0.11* 0.12* 0.12* 0.11*   BNP (last 3 results)  Recent Labs  07/15/15 0628  BNP 123.7*    ProBNP (last 3 results) No results for input(s): PROBNP in the last 8760 hours.  CBG: No results for input(s): GLUCAP in the last 168 hours.  Recent Results (from the past 240 hour(s))  Blood culture (routine x 2)     Status: None (Preliminary result)   Collection Time: 07/15/15  6:00 AM  Result Value Ref Range Status   Specimen Description BLOOD RIGHT ANTECUBITAL  Final   Special Requests BOTTLES DRAWN AEROBIC AND ANAEROBIC 10CC  Final   Culture NO GROWTH 1 DAY  Final   Report Status PENDING  Incomplete  Blood culture (routine x 2)     Status: None (Preliminary result)   Collection Time: 07/15/15  6:15 AM  Result Value Ref Range Status   Specimen Description BLOOD RIGHT HAND  Final  Special Requests BOTTLES DRAWN AEROBIC ONLY 5CC  Final   Culture NO GROWTH 1 DAY  Final   Report Status PENDING  Incomplete  MRSA PCR Screening     Status: None   Collection Time: 07/15/15  9:09 PM  Result Value Ref Range Status   MRSA by PCR NEGATIVE NEGATIVE Final    Comment:        The GeneXpert MRSA Assay (FDA approved for NASAL specimens only), is one component  of a comprehensive MRSA colonization surveillance program. It is not intended to diagnose MRSA infection nor to guide or monitor treatment for MRSA infections.      Studies: Portable Chest 1 View  07/16/2015  CLINICAL DATA:  Pneumonia, history asthma, paroxysmal atrial fibrillation, stroke, GERD EXAM: PORTABLE CHEST 1 VIEW COMPARISON:  Portable exam 0541 hours compared to 07/15/2015 FINDINGS: RIGHT costophrenic angle excluded. Enlargement of cardiac silhouette. Atherosclerotic calcification aorta. Minimal pulmonary vascular congestion. Chronic central peribronchial thickening unchanged. Question atelectasis versus infiltrate in LEFT lower lobe. Remaining lungs clear. No gross pleural effusion or pneumothorax. Bones demineralized with chronic RIGHT rotator cuff tear. IMPRESSION: Enlargement of cardiac silhouette with pulmonary vascular congestion. Bronchitic changes with questionable infiltrate versus atelectasis in LEFT lower lobe. Electronically Signed   By: Lavonia Dana M.D.   On: 07/16/2015 09:40    Scheduled Meds: . apixaban  5 mg Oral BID  . B-complex with vitamin C  1 tablet Oral Daily  . diltiazem  180 mg Oral Daily  . fluticasone  2 spray Each Nare Daily  . guaiFENesin  1,200 mg Oral BID  . levalbuterol  0.63 mg Nebulization TID  . loratadine  10 mg Oral Daily  . methylPREDNISolone (SOLU-MEDROL) injection  60 mg Intravenous Q12H  . piperacillin-tazobactam (ZOSYN)  IV  3.375 g Intravenous 3 times per day  . sertraline  50 mg Oral Daily  . sodium chloride  3 mL Intravenous Q12H  . vancomycin  500 mg Intravenous Q12H   Continuous Infusions: . sodium chloride 75 mL (07/17/15 0023)    Principal Problem:   Severe sepsis (HCC) Active Problems:   Acute respiratory failure with hypercapnia (HCC)   Anxiety state   Atrial fibrillation (HCC)   Bronchitis   Aortic valve disorder   SAH (subarachnoid hemorrhage) (HCC)   SDH (subdural hematoma) (HCC)   Benign essential HTN   CVA  (cerebral infarction)   Stroke (HCC)   Chronic atrial fibrillation (HCC)   Chronic anticoagulation   Hypotension   Elevated troponin   PNA (pneumonia)   Leukocytosis    Time spent: 40 mins    [facility] MD [facility] Pager [phone]. If 7PM-7AM, please contact night-coverage at [email or web address], password [facility] 07/17/2015, 8:50 AM  LOS: 2 days

## 2015-07-18 DIAGNOSIS — D72829 Elevated white blood cell count, unspecified: Secondary | ICD-10-CM

## 2015-07-18 DIAGNOSIS — R0682 Tachypnea, not elsewhere classified: Secondary | ICD-10-CM

## 2015-07-18 DIAGNOSIS — F411 Generalized anxiety disorder: Secondary | ICD-10-CM

## 2015-07-18 DIAGNOSIS — Z8673 Personal history of transient ischemic attack (TIA), and cerebral infarction without residual deficits: Secondary | ICD-10-CM

## 2015-07-18 LAB — CBC WITH DIFFERENTIAL/PLATELET
BASOS ABS: 0 10*3/uL (ref 0.0–0.1)
BASOS PCT: 0 %
EOS ABS: 0 10*3/uL (ref 0.0–0.7)
Eosinophils Relative: 0 %
HEMATOCRIT: 39 % (ref 36.0–46.0)
HEMOGLOBIN: 12.7 g/dL (ref 12.0–15.0)
Lymphocytes Relative: 4 %
Lymphs Abs: 0.5 10*3/uL — ABNORMAL LOW (ref 0.7–4.0)
MCH: 33.7 pg (ref 26.0–34.0)
MCHC: 32.6 g/dL (ref 30.0–36.0)
MCV: 103.4 fL — ABNORMAL HIGH (ref 78.0–100.0)
MONOS PCT: 2 %
Monocytes Absolute: 0.3 10*3/uL (ref 0.1–1.0)
NEUTROS ABS: 12.6 10*3/uL — AB (ref 1.7–7.7)
NEUTROS PCT: 94 %
Platelets: 205 10*3/uL (ref 150–400)
RBC: 3.77 MIL/uL — AB (ref 3.87–5.11)
RDW: 14.5 % (ref 11.5–15.5)
WBC: 13.4 10*3/uL — AB (ref 4.0–10.5)

## 2015-07-18 LAB — MAGNESIUM: MAGNESIUM: 2.1 mg/dL (ref 1.7–2.4)

## 2015-07-18 LAB — BASIC METABOLIC PANEL
ANION GAP: 7 (ref 5–15)
BUN: 17 mg/dL (ref 6–20)
CHLORIDE: 107 mmol/L (ref 101–111)
CO2: 26 mmol/L (ref 22–32)
CREATININE: 0.73 mg/dL (ref 0.44–1.00)
Calcium: 8.1 mg/dL — ABNORMAL LOW (ref 8.9–10.3)
GFR calc non Af Amer: >60 mL/min (ref >60)
Glucose, Bld: 133 mg/dL — ABNORMAL HIGH (ref 65–99)
POTASSIUM: 3.7 mmol/L (ref 3.5–5.1)
SODIUM: 140 mmol/L (ref 135–145)

## 2015-07-18 LAB — VANCOMYCIN, TROUGH: VANCOMYCIN TR: 13 ug/mL (ref 10.0–20.0)

## 2015-07-18 MED ORDER — METHYLPREDNISOLONE SODIUM SUCC 125 MG IJ SOLR
60.0000 mg | Freq: Two times a day (BID) | INTRAMUSCULAR | Status: DC
  Administered 2015-07-18 – 2015-07-19 (×2): 60 mg via INTRAVENOUS
  Filled 2015-07-18 (×2): qty 2

## 2015-07-18 NOTE — Evaluation (Signed)
Occupational Therapy Evaluation Patient Details Name: Caroline Myers MRN: [id number] DOB: April 30, 1924 Today's Date: 07/18/2015    History of Present Illness Caroline Myers is a 82 y.o.female with known history of PAF, AoV stenosis, CVA X 2, on Eliquis with CHADS Score of 5, admitted with sepsis, hypotension, tachypnea, and fever. She had been having productive coughing with yellow phlegm and chest discomfort during coughing. Hallucinations as well.    Clinical Impression   PT admitted with sepsis HTN tachycardia and fever.. Pt currently with functional limitiations due to the deficits listed below (see OT problem list). PTA was able to complete adls MOD I at home and living with spouse. Question level of (A) available from family and spouse. Pt with cognitive deficits and question baseline deficits since stroke or new. Pt will benefit from skilled OT to increase their independence and safety with adls and balance to allow discharge CIR.     Follow Up Recommendations  CIR    Equipment Recommendations  Other (comment) (defer to next level)    Recommendations for Other Services Rehab consult     Precautions / Restrictions Precautions Precautions: Fall      Mobility Bed Mobility               General bed mobility comments: in chair on arrival  Transfers Overall transfer level: Needs assistance Equipment used: 2 person hand held assist Transfers: Sit to/from Stand Sit to Stand: +2 physical assistance;Mod assist         General transfer comment: pt initially requires total + 2 (A) due to LOB. tp requires two person to manage lines and leads on return from bathroom. pt with incr stability after static standing at sink and toilet transfer however remains with deficits     Balance Overall balance assessment: Needs assistance Sitting-balance support: No upper extremity supported;Feet supported Sitting balance-Leahy Scale: Fair     Standing balance support: Bilateral  upper extremity supported;During functional activity Standing balance-Leahy Scale: Poor Standing balance comment: leaning heavily on UE                            ADL Overall ADL's : Needs assistance/impaired Eating/Feeding: Set up;Sitting Eating/Feeding Details (indicate cue type and reason): drinking coffee and finishing breakfast Grooming: Oral care;Min guard;Standing Grooming Details (indicate cue type and reason): leaning heavily on sink with L UE and brushing with R UE. pt would require min (A) if not leaning with L UE     Lower Body Bathing: Total assistance Lower Body Bathing Details (indicate cue type and reason): after voiding bowel for complete hygiene         Toilet Transfer: Moderate assistance;Ambulation;Regular Toilet;Grab bars Toilet Transfer Details (indicate cue type and reason): requires (A) to position on toilet Toileting- Clothing Manipulation and Hygiene: Minimal assistance       Functional mobility during ADLs: Moderate assistance (hand held (A) and scissored gait) General ADL Comments: Pt requesting to brush teeth on arrival. pt with scissored gait to bathroom and unawareness to LOB. pt needs (A) to complete adls due to cogntiive deficits. pt with improved balance exiting bathroom but remained unsteady. Uncertain on (A) level at home and spouse uses cane. Recommending CIR level care however patient may required SNF level if husband can not provide care in 7-14 days at a supervision / min (A) level for adls     Vision     Perception     Praxis  Pertinent Vitals/Pain Pain Assessment: No/denies pain     Hand Dominance Right   Extremity/Trunk Assessment Upper Extremity Assessment Upper Extremity Assessment: Overall WFL for tasks assessed   Lower Extremity Assessment Lower Extremity Assessment: Defer to PT evaluation   Cervical / Trunk Assessment Cervical / Trunk Assessment: Kyphotic   Communication Communication Communication: No  difficulties   Cognition Arousal/Alertness: Awake/alert Behavior During Therapy: WFL for tasks assessed/performed Overall Cognitive Status: Impaired/Different from baseline Area of Impairment: Orientation;Attention;Memory;Following commands;Safety/judgement;Awareness;Problem solving Orientation Level: Disoriented to;Place;Situation;Time Current Attention Level: Sustained Memory: Decreased short-term memory Following Commands: Follows one step commands with increased time Safety/Judgement: Decreased awareness of safety;Decreased awareness of deficits Awareness: Intellectual Problem Solving: Slow processing;Difficulty sequencing General Comments: Pt unaware of location at [facility] and reports [facility]. pt report "asking what day did i come here?" Pt seems extremely shocked when notified that she was admitted on 07/15/15 adn current date 22-Jul-2015 . .td Pt asked if she needs to use the commode prior to return to the chair. pt voiding bowels at sink level immediately without awareness and coughing. pt voiding on commode.    General Comments       Exercises       Shoulder Instructions      Home Living Family/patient expects to be discharged to:: Private residence Living Arrangements: Spouse/significant other Available Help at Discharge: Family;Available 24 hours/day Type of Home: House Home Access: Stairs to enter [employer] of Steps: 1 brick step Entrance Stairs-Rails: None Home Layout: One level     Bathroom Shower/Tub: Tub/shower unit Shower/tub characteristics: Architectural technologist: Standard Bathroom Accessibility: Yes   Home Equipment: Environmental consultant - 2 wheels;Cane - single point;Grab bars - tub/shower          Prior Functioning/Environment Level of Independence: Independent             OT Diagnosis: Generalized weakness;Cognitive deficits   OT Problem List: Decreased strength;Decreased activity tolerance;Impaired balance (sitting and/or standing);Decreased  cognition;Decreased safety awareness;Decreased knowledge of use of DME or AE;Decreased knowledge of precautions   OT Treatment/Interventions: Self-care/ADL training;Therapeutic exercise;DME and/or AE instruction;Therapeutic activities;Cognitive remediation/compensation;Patient/family education;Balance training    OT Goals(Current goals can be found in the care plan section) Acute Rehab OT Goals Patient Stated Goal: to brush my teeth OT Goal Formulation: Patient unable to participate in goal setting Time For Goal Achievement: 08/01/15 Potential to Achieve Goals: Good  OT Frequency: Min 2X/week   Barriers to D/C:            Co-evaluation              End of Session Equipment Utilized During Treatment: Gait belt Nurse Communication: Mobility status;Precautions  Activity Tolerance: Patient tolerated treatment well Patient left: in chair;with call bell/phone within reach;with chair alarm set   Time: [id number] OT Time Calculation (min): 23 min Charges:  OT General Charges $OT Visit: 1 Procedure OT Evaluation $OT Eval Moderate Complexity: 1 Procedure OT Treatments $Self Care/Home Management : 8-22 mins G-Codes:    Parke Poisson B 07/22/2015, 1:03 PM   Jeri Modena   OTR/L PagerOH:[phone] Office: [phone] .

## 2015-07-18 NOTE — Progress Notes (Addendum)
I met with pt and her spouse at bedside to discuss rehab venue options. Pt and spouse state their first choice is to go home with [facility] and support of their daughter, [name], as they have done in the past. They will also discuss with their daughter the possibility of inpt rehab vs SNF rehab in case she does not mobilize well in the next few days. I have left my contact information for daughter to call and discuss with me. I will follow up tomorrow. [phone]

## 2015-07-18 NOTE — Consult Note (Signed)
Physical Medicine and Rehabilitation Consult Reason for Consult: Debilitation secondary to sepsis with acute respiratory failure Referring Physician: [facility]   HPI: Caroline Myers is a 82 y.o. right handed female with history of atrial fibrillation maintained on Eliquis, aortic stenosis, CVA 2. Patient lives with spouse one level home 1 step to entry. Pt and spouse help each other.  Ambulates without assistive device but only short distances. She has had recent falls over the past few weeks. Daughter is currently assisting at home as patient's husband is limited. Presented 07/15/2015 with hypotension with systolic pressures in the 60s and tachypnea, tachycardia as well as low-grade fever, productive cough. Lactic acid level 3.03. Chest x-ray negative. Blood cultures completed showing no growth. Placed on broad-spectrum antibiotics. Follow-up chest x-ray bronchitic changes with questionable infiltrate versus atelectasis left lower lobe. Troponin mildly elevated 0.13 felt to be related to demand ischemia and follow-up per cardiology services. Placed on gentle IV fluids. Echocardiogram with ejection fraction of 55% and normal systolic function. Tolerating a regular consistency diet. Physical therapy evaluation completed 07/17/2015 with recommendations of physical medicine rehabilitation consult.   Review of Systems  Constitutional: Negative for chills.  HENT: Positive for hearing loss.   Eyes: Negative for blurred vision and double vision.  Respiratory: Positive for shortness of breath.   Cardiovascular: Negative for chest pain.  Gastrointestinal: Positive for constipation. Negative for nausea and vomiting.       GERD  Genitourinary: Negative for dysuria and hematuria.  Musculoskeletal: Positive for joint pain and falls.  Skin: Negative for rash.  Neurological: Negative for weakness and headaches.  Psychiatric/Behavioral:       Anxiety  All other systems reviewed and are  negative.  Past Medical History  Diagnosis Date  . Allergy     allergic rhinitis  . Cancer [facility])     history of skin cancer of the nose  . Asthma 2000    mild. tried Advair after bronchospasm after exposure to cats  . Shoulder pain, left 05/2010    with rotator cuff tear--Dr Alfonso Ramus  . Paroxysmal atrial fibrillation (HCC)     pt does not want anticoagulation  . Aortic stenosis     EF normal.  Mild aortic stenosis. Mild to moderate aortic regurgitation  . Anxiety   . Shortness of breath     occ  . GERD (gastroesophageal reflux disease)     occ  . Arthritis   . Stroke [facility])    Past Surgical History  Procedure Laterality Date  . Hernia repair  2003  . Acromioplasty  2000    right shoulder, arthroscopic  . Lumbar laminectomy/decompression microdiscectomy  04/01/2012    Procedure: LUMBAR LAMINECTOMY/DECOMPRESSION MICRODISCECTOMY 1 LEVEL;  Surgeon: Eustace Moore, MD;  Location: [facility];  Service: Neurosurgery;  Laterality: Right;  Right Lumbar four-five extraforaminal microdiskectomy    Family History  Problem Relation Age of Onset  . Heart attack Neg Hx   . Stroke Sister   . Hypertension Neg Hx    Social History:  reports that she has never smoked. She has never used smokeless tobacco. She reports that she drinks alcohol. She reports that she does not use illicit drugs. Allergies:  Allergies  Allergen Reactions  . Tape Other (See Comments)    Rips skin   Medications Prior to Admission  Medication Sig Dispense Refill  . ALPRAZolam (XANAX) 0.25 MG tablet Take 0.25 mg by mouth 2 (two) times daily as needed for anxiety.     Marland Kitchen  apixaban (ELIQUIS) 5 MG TABS tablet Take 1 tablet (5 mg total) by mouth 2 (two) times daily. 180 tablet 3  . B Complex-C (B-COMPLEX WITH VITAMIN C) tablet Take 1 tablet by mouth daily.    Marland Kitchen diltiazem (CARDIZEM CD) 180 MG 24 hr capsule Take 180 mg by mouth daily.    Marland Kitchen PROAIR HFA 108 (90 Base) MCG/ACT inhaler Inhale 1-2 puffs into the lungs every 4  (four) hours as needed for wheezing or shortness of breath.   0  . sertraline (ZOLOFT) 50 MG tablet Take 50 mg by mouth daily.  4    Home: Home Living Family/patient expects to be discharged to:: Private residence Living Arrangements: Spouse/significant other Available Help at Discharge: Family, Available 24 hours/day (Daughter is staying with husband as pt usually A him) Type of Home: House Home Access: Stairs to enter [employer] of Steps: 1 brick step Entrance Stairs-Rails: None Home Layout: One level Bathroom Shower/Tub: Chiropodist: Standard Bathroom Accessibility: Yes Home Equipment: Environmental consultant - 2 wheels, Cane - single point, Grab bars - tub/shower Additional Comments: pt ambulates without assistive device and husband uses a cane.  Has had several falls per pt.  Functional History: Prior Function Level of Independence: Independent Functional Status:  Mobility: Bed Mobility General bed mobility comments: pt in recliner on arrival Transfers Overall transfer level: Needs assistance Equipment used: 2 person hand held assist Transfers: Sit to/from Stand Sit to Stand: Mod assist, +2 physical assistance General transfer comment: Pt needed assist to power up.  Pt having difficulty with anterior lean due to posterior bias.  Pt with bil knee instability and having difficulty with steadying herself needing mod assist for steadying.   Ambulation/Gait Ambulation/Gait assistance: Min assist, Mod assist, +2 physical assistance Ambulation Distance (Feet): 2 Feet Assistive device: 2 person hand held assist Gait Pattern/deviations: Decreased stride length, Step-to pattern, Ataxic, Leaning posteriorly General Gait Details: Pt marched in place and was weight shifting but once pt tried to take a step forward, unable due to unsteady on feet even with +2 person assist.   Gait velocity interpretation: Below normal speed for age/gender    ADL:     Cognition: Cognition Overall Cognitive Status: Impaired/Different from baseline Orientation Level: Oriented X4 Cognition Arousal/Alertness: Awake/alert Behavior During Therapy: Anxious Overall Cognitive Status: Impaired/Different from baseline Area of Impairment: Safety/judgement, Awareness, Problem solving, Orientation Orientation Level: Place, Time, Situation Safety/Judgement: Decreased awareness of safety, Decreased awareness of deficits Awareness: Intellectual Problem Solving: Difficulty sequencing, Requires verbal cues, Requires tactile cues General Comments: Pt intermittently hallucinating.  Blood pressure 131/77, pulse 83, temperature 98.3 F (36.8 C), temperature source Oral, resp. rate 16, height 5' 5.5" (1.664 m), weight 72.5 kg (159 lb 13.3 oz), SpO2 96 %. Physical Exam  Vitals reviewed. Constitutional: She is oriented to person, place, and time. No distress.  Frail 82 year old Caucasian female  HENT:  Head: Normocephalic.  Mouth/Throat: Oropharynx is clear and moist.  Eyes: Conjunctivae and EOM are normal.  Neck: Normal range of motion. Neck supple. No thyromegaly present.  Cardiovascular:  Irregular irregular  Respiratory: She has wheezes.  Decreased breath sounds at the bases with limited inspiratory effort +  GI: Soft. Bowel sounds are normal. She exhibits no distension.  Neurological: She is alert and oriented to person, place, and time. She has normal reflexes.  Sensation intact to light touch Slightly confused at times Motor: B/l UE 4/5 proximal to distal B/l LE 4+/5 proximal to distal  Skin: Skin is warm and dry.  Ecchymosis dorsal right hand  Psychiatric: Her behavior is normal. Judgment normal.  Talkative    Results for orders placed or performed during the hospital encounter of 07/15/15 (from the past 24 hour(s))  CBC with Differential/Platelet     Status: Abnormal   Collection Time: 07/18/15  2:30 AM  Result Value Ref Range   WBC 13.4 (H)  4.0 - 10.5 K/uL   RBC 3.77 (L) 3.87 - 5.11 MIL/uL   Hemoglobin 12.7 12.0 - 15.0 g/dL   HCT 39.0 36.0 - 46.0 %   MCV 103.4 (H) 78.0 - 100.0 fL   MCH 33.7 26.0 - 34.0 pg   MCHC 32.6 30.0 - 36.0 g/dL   RDW 14.5 11.5 - 15.5 %   Platelets 205 150 - 400 K/uL   Neutrophils Relative % 94 %   Neutro Abs 12.6 (H) 1.7 - 7.7 K/uL   Lymphocytes Relative 4 %   Lymphs Abs 0.5 (L) 0.7 - 4.0 K/uL   Monocytes Relative 2 %   Monocytes Absolute 0.3 0.1 - 1.0 K/uL   Eosinophils Relative 0 %   Eosinophils Absolute 0.0 0.0 - 0.7 K/uL   Basophils Relative 0 %   Basophils Absolute 0.0 0.0 - 0.1 K/uL  Basic metabolic panel     Status: Abnormal   Collection Time: 07/18/15  2:30 AM  Result Value Ref Range   Sodium 140 135 - 145 mmol/L   Potassium 3.7 3.5 - 5.1 mmol/L   Chloride 107 101 - 111 mmol/L   CO2 26 22 - 32 mmol/L   Glucose, Bld 133 (H) 65 - 99 mg/dL   BUN 17 6 - 20 mg/dL   Creatinine, Ser 0.73 0.44 - 1.00 mg/dL   Calcium 8.1 (L) 8.9 - 10.3 mg/dL   GFR calc non Af Amer >60 >60 mL/min   GFR calc Af Amer >60 >60 mL/min   Anion gap 7 5 - 15  Magnesium     Status: None   Collection Time: 07/18/15  2:30 AM  Result Value Ref Range   Magnesium 2.1 1.7 - 2.4 mg/dL   Dg Chest Port 1 View  07/17/2015  CLINICAL DATA:  Shortness of breath EXAM: PORTABLE CHEST 1 VIEW COMPARISON:  07/16/2015 FINDINGS: Cardiomegaly. Small bilateral pleural effusions with bibasilar atelectasis. No overt edema. No acute bony abnormality. Degenerative changes in the shoulders and thoracic spine. Rightward scoliosis of the thoracic spine. IMPRESSION: Cardiomegaly. Small bilateral pleural effusions with bibasilar atelectasis. Electronically Signed   By: Rolm Baptise M.D.   On: 07/17/2015 10:18   Portable Chest 1 View  07/16/2015  CLINICAL DATA:  Pneumonia, history asthma, paroxysmal atrial fibrillation, stroke, GERD EXAM: PORTABLE CHEST 1 VIEW COMPARISON:  Portable exam 0541 hours compared to 07/15/2015 FINDINGS: RIGHT  costophrenic angle excluded. Enlargement of cardiac silhouette. Atherosclerotic calcification aorta. Minimal pulmonary vascular congestion. Chronic central peribronchial thickening unchanged. Question atelectasis versus infiltrate in LEFT lower lobe. Remaining lungs clear. No gross pleural effusion or pneumothorax. Bones demineralized with chronic RIGHT rotator cuff tear. IMPRESSION: Enlargement of cardiac silhouette with pulmonary vascular congestion. Bronchitic changes with questionable infiltrate versus atelectasis in LEFT lower lobe. Electronically Signed   By: Lavonia Dana M.D.   On: 07/16/2015 09:40    Assessment/Plan: Diagnosis: Debilitation secondary to sepsis with acute respiratory failure Labs and images independently reviewed.  Records reviewed and summated above.  1. Does the need for close, 24 hr/day medical supervision in concert with the patient's rehab needs make it unreasonable for this patient to be  served in a less intensive setting? Potentially 2. Co-Morbidities requiring supervision/potential complications: atrial fibrillation (cont meds, monitor with increased activity), aortic stenosis (Monitor in accordance with increased physical activity and avoid UE resistance excercises), CVA 2, tachypnea (monitor RR and O2 Sats with increased physical exertion), leukocytosis (trending down, cont to monitor for signs and symptoms of further infection), anxiety (ensure anxiety and resulting apprehension do not limit functional progress; cont prn medications if warranted) 3. Due to safety, skin/wound care, disease management, medication administration and patient education, does the patient require 24 hr/day rehab nursing? Yes 4. Does the patient require coordinated care of a physician, rehab nurse, PT (1-2 hrs/day, 5 days/week) and OT (1-2 hrs/day, 5 days/week) to address physical and functional deficits in the context of the above medical diagnosis(es)? Yes Addressing deficits in the following  areas: endurance, locomotion, strength, transferring, dressing, grooming, toileting and psychosocial support 5. Can the patient actively participate in an intensive therapy program of at least 3 hrs of therapy per day at least 5 days per week? Potentially 6. The potential for patient to make measurable gains while on inpatient rehab is excellent 7. Anticipated functional outcomes upon discharge from inpatient rehab are modified independent and supervision  with PT, modified independent and supervision with OT, n/a with SLP. 8. Estimated rehab length of stay to reach the above functional goals is: 13-17 days. 9. Does the patient have adequate social supports and living environment to accommodate these discharge functional goals? Yes 10. Anticipated D/C setting: Home 11. Anticipated post D/C treatments: HH therapy and Home excercise program 12. Overall Rehab/Functional Prognosis: excellent  RECOMMENDATIONS: This patient's condition is appropriate for continued rehabilitative care in the following setting: CIR Patient has agreed to participate in recommended program. Yes Note that insurance prior authorization may be required for reimbursement for recommended care.  Comment: Rehab Admissions Coordinator to follow up  Delice Lesch, MD 07/18/2015

## 2015-07-18 NOTE — Progress Notes (Signed)
[facility] PROGRESS NOTE  DEBARAH MCKINZIE [id number] DOB: [birth year] DOA: 07/15/2015 PCP:  Melinda Crutch, MD  Brief interval history Caroline Myers is a 82 y.o. female, with atrial fibrillation on Eliquis, anxiety, aortic stenosis, and a history of 2 CVAs. She presents to the emergency department via EMS with severe sepsis, hypotension, tachypnea, tachycardia and fever. Per her husband she is normally ambulatory at home. She cannot walk far, but she does perform all of her own ADLs and does not use a cane or walker. Her family mentions that over the past week she has developed a cough with yellow phlegm. When her coughing is severe she has had chest pain. They have noticed her becoming more short of breath walking even short distances. She saw her primary care physician on 1/13. He gave her a nebulizer and prescribed Xanax for severe anxiety. Overnight she decompensated and developed respiratory distress. When EMS evaluated her her systolic blood pressure was in the 60s, and her respiratory rate was in the 40s.  Of note, the patient has been on 2 courses of prednisone recently for sciatica. I believe these ended approximately 2 weeks ago.  In the emergency department she has had a low-grade fever, 100.1, her lactic acid is 3.03 after 2 L of fluid, blood gas shows a PCO2 of 55.9, and a pH of 7.310, her blood pressure appears to be stabilizing with fluids - her systolic is now in the 0000000. Chest x-ray is clear, EKG shows atrial fibrillation. Flu swab is negative.  Patient was admitted to the step down unit pancultured placed empirically on IV vancomycin IV Zosyn as well as on the BiPAP. BiPAP was subsequently discontinued and Pulmicort and Brovana added to patient's nebulizers. Patient was also placed on IV steroids with improvement in the respiratory function. Patient was also noted to have elevated troponins which was felt to be secondary to a demand ischemia. Cardiology was consulted and and  felt no further ischemic workup needed.   Assessment/Plan: #1 severe sepsis Likely secondary to community-acquired pneumonia. Patient presented with sepsis criteria with tachycardia, tachypnea, low-grade fever, lactic acidosis, hypotension with systolic blood pressure in the 60s with a respiratory acidosis. Initial ABG and placed on the BiPAP. Patient has been pancultured cultures are pending. Repeat chest x-ray on 07/16/2015 with questionable atelectasis versus infiltrate in the left lower lobe. Center lock IV fluids. Continue empiric IV Zosyn. Discontinue IV vancomycin. Follow.  #2 acute respiratory failure with hypoxia Patient had presented with acute respiratory failure with hypoxia and had upper respiratory symptoms prior to admission. Patient also with complaints of worsening chronic shortness of breath over the past several months. Patient with a history of mild aortic stenosis. Patient currently off BiPAP and on nasal cannula, with clinical improvement. Patient did not get her IV Solu-Medrol the night of 07/16/2015. Repeat chest x-ray  07/16/2015 with questionable atelectasis versus infiltrate in the left lower lobe. Saline lock IV fluids. Stool Gram stain and culture pending. Urine Legionella antigen pending. Urine pneumococcus antigen pending. 2-D echo with EF of 50-55% with incoordinated in septal motion, mild aortic stenosis, moderate pulmonary hypertension. Influenza PCR is negative. Continue Solu-Medrol to 60 mg IV every 8 hours, Pulmicort and Brovana,  IV Zosyn, Mucinex, nebulizers, Flonase, Claritin. DC IV vancomycin. Follow.  #3 probable community-acquired pneumonia Per repeat chest x-ray this morning. Patient endorses upper respiratory symptoms over the past week. Patient had presented with acute respiratory failure with hypoxia and hypotension. Sputum Gram stain and culture pending. Urine Legionella antigen  pending. Urine pneumococcus antigen pending. Mucinex to 1200 mg twice daily.  Continue empiric IV Zosyn, Flonase, Claritin, nebulizers. DC IV vancomycin. Follow.  #4 hypotension Likely secondary to hypovolemia versus secondary to sepsis versus cardiac etiology. Blood pressure has responded to IV fluids. Continue IV fluids at  75 mL per hour. Patient has been pancultured and results are pending. Repeat chest x-ray with questionable left lower lobe atelectasis versus infiltrate. Cardiac enzymes elevated.  2-D echo with EF of 50-55% with incoordinated in septal motion, mild aortic stenosis, moderate pulmonary hypertension. Continue empiric IV Zosyn, Mucinex. Follow.  #5 elevated troponins Per cardiology likely a demand ischemia. Patient had presented with a history of worsening shortness of breath over the past several months. Patient also noted to be hypotensive on admission. Patient stated had a chest heaviness this morning which has since improved. Patient with history of mild aortic stenosis. Cardiac enzymes seem to have plateaued. 2-D echo with EF of 50-55% with a coordinated septal motion, mild aortic stenosis, moderate pulmonary hypertension. Continue home regimen of Cardizem. Continue gentle hydration. No further ischemic workup needed per cardiology.   #6 mild aortic stenosis Patient states she's been having progressive worsening shortness of breath over the past several months. Repeat 2-D echo with EF of 50-55% with incoordinate in septal motion, mild aortic stenosis, moderate pulmonary hypertension. Per cardiology no further workup needed.   #7 lactic acidosis Likely secondary to problem #1 versus dehydration. Trending down. Patient has been pancultured. Continue empiric antibiotics.  #8 anxiety Continue Zoloft. Xanax was discontinued as patient was noted to be hallucinating. Hallucinations resolved.   #9 history of atrial fibrillation Heart rate in the 80s. Cardiac enzymes elevated however plateaued. 2-D echo with EF of 50-55% with incoordinate in septal motion.  Mild aortic stenosis. Moderate pulmonary hypertension. Continue Cardizem for rate control. Continue eliquis.  #10 history of CVA Continue eliquis for secondary stroke prevention.  #11 pulmonary hypertension  #12 leukocytosis Maybe secondary to pneumonia and IV steroids. Patient has been pancultured results pending. Leukocytosis trending down. Discontinue IV vancomycin. Continue IV Zosyn. Follow.   #13 prophylaxis On eliquis for DVT prophylaxis.  Code Status: DO NOT RESUSCITATE Family Communication: Updated patient. No family at bedside. Disposition Plan: Hopefully to inpatient rehabilitation versus SNF when medically stable.   Consultants:  Cardiology: Dr. Gwenlyn Found 07/16/2015  Procedures:  Chest x-ray on 14 2017, 07/16/2015, 07/17/2015  2-D echo 07/16/2015  Antibiotics:  IV vancomycin 07/15/2015>>>>> 07/18/2015  IV Zosyn 07/15/2015  HPI/Subjective: Patient laying in bed and feels better. Patient states shortness of breath has improved. Patient states was given some medication overnight and has had the best sleep ever over the past 2 days. Patient denies any chest pain.   Objective: Filed Vitals:   07/18/15 0322 07/18/15 0823  BP: 131/77 129/99  Pulse:    Temp: 98.3 F (36.8 C) 97.6 F (36.4 C)  Resp: 16 22    Intake/Output Summary (Last 24 hours) at 07/18/15 0851 Last data filed at 07/18/15 0700  Gross per 24 hour  Intake   2655 ml  Output   1050 ml  Net   1605 ml   Filed Weights   07/15/15 1847 07/16/15 0348 07/18/15 0322  Weight: 67.7 kg (149 lb 4 oz) 69.3 kg (152 lb 12.5 oz) 72.5 kg (159 lb 13.3 oz)    Exam:   General:  NAD  Cardiovascular: Irregularly irregular  Respiratory:  Minimal expiratory wheezing, fair air movement, coarse/ rhonchorus BS in R base. No crackles.  Abdomen:  Soft, nontender, nondistended, positive bowel sounds.  Musculoskeletal: No clubbing cyanosis or edema.  Data Reviewed: Basic Metabolic Panel:  Recent Labs Lab  07/15/15 0628 07/16/15 0110 07/17/15 0232 07/18/15 0230  NA 134* 137 138 140  K 4.0 3.8 3.7 3.7  CL 99* 103 106 107  CO2 26 25 24 26   GLUCOSE 146* 145* 135* 133*  BUN 14 16 21* 17  CREATININE 0.79 0.84 0.87 0.73  CALCIUM 8.3* 8.2* 8.2* 8.1*  MG  --   --   --  2.1   Liver Function Tests:  Recent Labs Lab 07/16/15 0110  AST 53*  ALT 23  ALKPHOS 76  BILITOT 0.7  PROT 5.9*  ALBUMIN 2.8*   No results for input(s): LIPASE, AMYLASE in the last 168 hours. No results for input(s): AMMONIA in the last 168 hours. CBC:  Recent Labs Lab 07/15/15 0628 07/17/15 0232 07/18/15 0230  WBC 9.8 19.7* 13.4*  NEUTROABS 8.7*  --  12.6*  HGB 13.2 13.4 12.7  HCT 40.0 41.2 39.0  MCV 102.3* 101.7* 103.4*  PLT 209 222 205   Cardiac Enzymes:  Recent Labs Lab 07/15/15 1942 07/16/15 0110 07/16/15 0820 07/16/15 1355 07/16/15 1857  TROPONINI 0.13* 0.11* 0.12* 0.12* 0.11*   BNP (last 3 results)  Recent Labs  07/15/15 0628  BNP 123.7*    ProBNP (last 3 results) No results for input(s): PROBNP in the last 8760 hours.  CBG: No results for input(s): GLUCAP in the last 168 hours.  Recent Results (from the past 240 hour(s))  Blood culture (routine x 2)     Status: None (Preliminary result)   Collection Time: 07/15/15  6:00 AM  Result Value Ref Range Status   Specimen Description BLOOD RIGHT ANTECUBITAL  Final   Special Requests BOTTLES DRAWN AEROBIC AND ANAEROBIC 10CC  Final   Culture NO GROWTH 2 DAYS  Final   Report Status PENDING  Incomplete  Blood culture (routine x 2)     Status: None (Preliminary result)   Collection Time: 07/15/15  6:15 AM  Result Value Ref Range Status   Specimen Description BLOOD RIGHT HAND  Final   Special Requests BOTTLES DRAWN AEROBIC ONLY 5CC  Final   Culture NO GROWTH 2 DAYS  Final   Report Status PENDING  Incomplete  MRSA PCR Screening     Status: None   Collection Time: 07/15/15  9:09 PM  Result Value Ref Range Status   MRSA by PCR NEGATIVE  NEGATIVE Final    Comment:        The GeneXpert MRSA Assay (FDA approved for NASAL specimens only), is one component of a comprehensive MRSA colonization surveillance program. It is not intended to diagnose MRSA infection nor to guide or monitor treatment for MRSA infections.   Culture, Urine     Status: None   Collection Time: 07/16/15  7:04 AM  Result Value Ref Range Status   Specimen Description URINE, RANDOM  Final   Special Requests NONE  Final   Culture NO GROWTH 1 DAY  Final   Report Status 07/17/2015 FINAL  Final     Studies: Dg Chest Port 1 View  07/17/2015  CLINICAL DATA:  Shortness of breath EXAM: PORTABLE CHEST 1 VIEW COMPARISON:  07/16/2015 FINDINGS: Cardiomegaly. Small bilateral pleural effusions with bibasilar atelectasis. No overt edema. No acute bony abnormality. Degenerative changes in the shoulders and thoracic spine. Rightward scoliosis of the thoracic spine. IMPRESSION: Cardiomegaly. Small bilateral pleural effusions with bibasilar atelectasis. Electronically Signed  By: Rolm Baptise M.D.   On: 07/17/2015 10:18    Scheduled Meds: . antiseptic oral rinse  7 mL Mouth Rinse BID  . apixaban  5 mg Oral BID  . arformoterol  15 mcg Nebulization BID  . B-complex with vitamin C  1 tablet Oral Daily  . budesonide (PULMICORT) nebulizer solution  0.25 mg Nebulization BID  . diltiazem  180 mg Oral Daily  . fluticasone  2 spray Each Nare Daily  . guaiFENesin  1,200 mg Oral BID  . levalbuterol  0.63 mg Nebulization TID  . loratadine  10 mg Oral Daily  . methylPREDNISolone (SOLU-MEDROL) injection  60 mg Intravenous 3 times per day  . piperacillin-tazobactam (ZOSYN)  IV  3.375 g Intravenous 3 times per day  . sertraline  50 mg Oral Daily  . sodium chloride  3 mL Intravenous Q12H  . vancomycin  500 mg Intravenous Q12H   Continuous Infusions: . sodium chloride 75 mL/hr at 07/18/15 0542    Principal Problem:   Severe sepsis (HCC) Active Problems:   Acute  respiratory failure with hypercapnia (HCC)   Anxiety state   Atrial fibrillation (HCC)   Bronchitis   Aortic valve disorder   SAH (subarachnoid hemorrhage) (HCC)   SDH (subdural hematoma) (HCC)   Benign essential HTN   CVA (cerebral infarction)   Stroke (HCC)   Chronic atrial fibrillation (HCC)   Chronic anticoagulation   Hypotension   Elevated troponin   PNA (pneumonia)   Leukocytosis   Arterial hypotension    Time spent: 40 mins    [facility] MD [facility] Pager [phone]. If 7PM-7AM, please contact night-coverage at [email or web address], password [facility] 07/18/2015, 8:51 AM  LOS: 3 days

## 2015-07-18 NOTE — Progress Notes (Signed)
Pharmacy Antibiotic Follow-up Note  Caroline Myers is a 82 y.o. year-old female admitted on 07/15/2015.  The patient is currently on day 4 of Zosyn for sepsis/CAP.  Assessment/Plan: WBC 13.4, afeb. Vancomycin trough this AM was 13 (subtherapeutic). Vancomycin was discontinued this morning. No action taken from this level. Patient to continue on Zosyn 3.375mg  Q8.  Temp (24hrs), Avg:97.7 F (36.5 C), Min:97.4 F (36.3 C), Max:98.3 F (36.8 C)   Recent Labs Lab 07/15/15 0628 07/17/15 0232 07/18/15 0230  WBC 9.8 19.7* 13.4*    Recent Labs Lab 07/15/15 0628 07/16/15 0110 07/17/15 0232 07/18/15 0230  CREATININE 0.79 0.84 0.87 0.73   Estimated Creatinine Clearance: 48.1 mL/min (by C-G formula based on Cr of 0.73).    Allergies  Allergen Reactions  . Tape Other (See Comments)    Rips skin    Antimicrobials this admission: 1/14 vancomycin>> 1/14 Zosyn >>  Levels/dose changes this admission: VT 1/17: 13 (level drawn appropriately)  - no dose change, medication wasdiscontinued   Microbiology results: 1/14 BCx x2 >> NG 1/15 UCx >> NG MRSA PCR neg   Thank you for allowing pharmacy to be a part of this patient's care.  Darl Pikes PharmD 07/18/2015 9:16 AM

## 2015-07-18 NOTE — Care Management Important Message (Signed)
Important Message  Patient Details  Name: Caroline Myers MRN: [id number] Date of Birth: [birth year]   Medicare Important Message Given:  Yes    Eller Sweis P [name] 07/18/2015, 3:26 PM

## 2015-07-19 ENCOUNTER — Inpatient Hospital Stay (HOSPITAL_COMMUNITY): Payer: Medicare Other

## 2015-07-19 LAB — CBC
HEMATOCRIT: 40.3 % (ref 36.0–46.0)
Hemoglobin: 13.1 g/dL (ref 12.0–15.0)
MCH: 33.3 pg (ref 26.0–34.0)
MCHC: 32.5 g/dL (ref 30.0–36.0)
MCV: 102.5 fL — ABNORMAL HIGH (ref 78.0–100.0)
Platelets: 226 10*3/uL (ref 150–400)
RBC: 3.93 MIL/uL (ref 3.87–5.11)
RDW: 14.4 % (ref 11.5–15.5)
WBC: 16 10*3/uL — AB (ref 4.0–10.5)

## 2015-07-19 LAB — BASIC METABOLIC PANEL
ANION GAP: 6 (ref 5–15)
BUN: 29 mg/dL — AB (ref 6–20)
CALCIUM: 8.2 mg/dL — AB (ref 8.9–10.3)
CO2: 26 mmol/L (ref 22–32)
Chloride: 108 mmol/L (ref 101–111)
Creatinine, Ser: 0.98 mg/dL (ref 0.44–1.00)
GFR calc Af Amer: 58 mL/min — ABNORMAL LOW (ref >60)
GFR, EST NON AFRICAN AMERICAN: 50 mL/min — AB (ref >60)
Glucose, Bld: 144 mg/dL — ABNORMAL HIGH (ref 65–99)
POTASSIUM: 3.6 mmol/L (ref 3.5–5.1)
SODIUM: 140 mmol/L (ref 135–145)

## 2015-07-19 LAB — C DIFFICILE QUICK SCREEN W PCR REFLEX
C DIFFICILE (CDIFF) INTERP: NEGATIVE
C DIFFICILE (CDIFF) TOXIN: NEGATIVE
C DIFFICLE (CDIFF) ANTIGEN: NEGATIVE

## 2015-07-19 MED ORDER — PREDNISONE 20 MG PO TABS
60.0000 mg | ORAL_TABLET | Freq: Every day | ORAL | Status: DC
  Administered 2015-07-20: 60 mg via ORAL
  Filled 2015-07-19: qty 3

## 2015-07-19 NOTE — PMR Pre-admission (Signed)
PMR Admission Coordinator Pre-Admission Assessment  Patient: Caroline Myers is an 82 y.o., female MRN: [id number] DOB: [birth year] Height: 5' 5.5" (166.4 cm) Weight: 72.122 kg (159 lb)              Insurance Information HMO:     PPO:      PCP:      IPA:      80/20: yes     OTHER:  No HMO PRIMARY: Medicare a and b      Policy#: [id number]      Subscriber: pt Benefits:  Phone #: Passport one online     Name: 07/19/15 Eff. Date: 06/01/91     Deduct: $1316      Out of Pocket Max: none      Life Max: none CIR: 100%      SNF: 20 full days Outpatient: 80%     Co-Pay: 20% Home Health: 100%      Co-Pay: none DME: 80%     Co-Pay: 20% Providers: pt choice  SECONDARY: [place]      Policy#: [id number]      Subscriber: pt No auth required  Medicaid Application Date:       Case Manager:  Disability Application Date:       Case Worker:   Emergency Contact Information Contact Information    Name Relation Home Work [place] C Spouse [place] Daughter [phone]       Current Medical History  Patient Admitting Diagnosis: debilitation secondary to sepsis with acute respiratory failure  History of Present Illness: Caroline Myers is a 82 y.o. right handed female with history of atrial fibrillation maintained on Eliquis, aortic stenosis, CVA 2. Patient lives with spouse one level home 1 step to entry. Pt and spouse help each other. Ambulates without assistive device but only short distances. She has had recent falls over the past few weeks. Daughter is currently assisting at home as patient's husband is limited. Presented 07/15/2015 with hypotension with systolic pressures in the 60s and tachypnea, tachycardia as well as low-grade fever, productive cough. Lactic acid level 3.03. Chest x-ray negative. Blood cultures completed showing no growth. Placed on broad-spectrum antibiotics. Follow-up chest x-ray bronchitic changes with questionable infiltrate versus  atelectasis left lower lobe. Troponin mildly elevated 0.13 felt to be related to demand ischemia and follow-up per cardiology services. Placed on gentle IV fluids. Echocardiogram with ejection fraction of 55% and normal systolic function. Tolerating a regular consistency diet.   Past Medical History  Past Medical History  Diagnosis Date  . Allergy     allergic rhinitis  . Cancer [facility])     history of skin cancer of the nose  . Asthma 2000    mild. tried Advair after bronchospasm after exposure to cats  . Shoulder pain, left 05/2010    with rotator cuff tear--Dr Alfonso Ramus  . Paroxysmal atrial fibrillation (HCC)     pt does not want anticoagulation  . Aortic stenosis     EF normal.  Mild aortic stenosis. Mild to moderate aortic regurgitation  . Anxiety   . Shortness of breath     occ  . GERD (gastroesophageal reflux disease)     occ  . Arthritis   . Stroke [facility])     Family History  family history includes Stroke in her sister. There is no history of Heart attack or Hypertension.  Prior Rehab/Hospitalizations:  Has the patient had major surgery during 100 days  prior to admission? No Has only had HH or OP therapy in the past  Current Medications   Current facility-administered medications:  .  acetaminophen (TYLENOL) tablet 650 mg, 650 mg, Oral, Q6H PRN, 650 mg at 07/19/15 0017 **OR** acetaminophen (TYLENOL) suppository 650 mg, 650 mg, Rectal, Q6H PRN, Bobby Rumpf York, PA-C .  alum & mag hydroxide-simeth (MAALOX/MYLANTA) 200-200-20 MG/5ML suspension 30 mL, 30 mL, Oral, Q6H PRN, Bobby Rumpf York, PA-C .  antiseptic oral rinse (CPC / CETYLPYRIDINIUM CHLORIDE 0.05%) solution 7 mL, 7 mL, Mouth Rinse, BID, Eugenie Filler, MD, 7 mL at 07/20/15 0859 .  apixaban (ELIQUIS) tablet 5 mg, 5 mg, Oral, BID, Bobby Rumpf York, PA-C, 5 mg at 07/20/15 0858 .  arformoterol (BROVANA) nebulizer solution 15 mcg, 15 mcg, Nebulization, BID, Eugenie Filler, MD, 15 mcg at 07/20/15 0911 .  B-complex with  vitamin C tablet 1 tablet, 1 tablet, Oral, Daily, Melton Alar, PA-C, 1 tablet at 07/19/15 0943 .  budesonide (PULMICORT) nebulizer solution 0.25 mg, 0.25 mg, Nebulization, BID, Eugenie Filler, MD, 0.25 mg at 07/20/15 0912 .  diltiazem (CARDIZEM CD) 24 hr capsule 180 mg, 180 mg, Oral, Daily, Melton Alar, PA-C, 180 mg at 07/20/15 0858 .  fluticasone (FLONASE) 50 MCG/ACT nasal spray 2 spray, 2 spray, Each Nare, Daily, Eugenie Filler, MD, 2 spray at 07/17/15 1042 .  guaiFENesin (MUCINEX) 12 hr tablet 1,200 mg, 1,200 mg, Oral, BID, Eugenie Filler, MD, 1,200 mg at 07/20/15 0859 .  guaiFENesin-dextromethorphan (ROBITUSSIN DM) 100-10 MG/5ML syrup 5 mL, 5 mL, Oral, Q4H PRN, Irine Seal V, MD, 5 mL at 07/19/15 0531 .  levalbuterol (XOPENEX) nebulizer solution 0.63 mg, 0.63 mg, Nebulization, Q2H PRN, Eugenie Filler, MD, 0.63 mg at 07/19/15 0232 .  levalbuterol (XOPENEX) nebulizer solution 0.63 mg, 0.63 mg, Nebulization, BID, Marianne L York, PA-C .  loratadine (CLARITIN) tablet 10 mg, 10 mg, Oral, Daily, Irine Seal V, MD, 10 mg at 07/20/15 0858 .  ondansetron (ZOFRAN) tablet 4 mg, 4 mg, Oral, Q6H PRN **OR** ondansetron (ZOFRAN) injection 4 mg, 4 mg, Intravenous, Q6H PRN, Bobby Rumpf York, PA-C, 4 mg at 07/17/15 0320 .  piperacillin-tazobactam (ZOSYN) IVPB 3.375 g, 3.375 g, Intravenous, 3 times per day, Meagan A Decker, RPH, Last Rate: 12.5 mL/hr at 07/20/15 0548, 3.375 g at 07/20/15 0548 .  predniSONE (DELTASONE) tablet 60 mg, 60 mg, Oral, Q breakfast, Hosie Poisson, MD, 60 mg at 07/20/15 0858 .  senna-docusate (Senokot-S) tablet 1 tablet, 1 tablet, Oral, QHS PRN, Bobby Rumpf York, PA-C .  sertraline (ZOLOFT) tablet 50 mg, 50 mg, Oral, Daily, Melton Alar, PA-C, 50 mg at 07/20/15 0858 .  sodium chloride 0.9 % injection 3 mL, 3 mL, Intravenous, Q12H, Marianne L York, PA-C, 3 mL at 07/19/15 2120 .  zolpidem (AMBIEN) tablet 5 mg, 5 mg, Oral, QHS PRN, Eugenie Filler, MD, 5 mg at  07/19/15 2303  Patients Current Diet: Diet Heart Room service appropriate?: Yes; Fluid consistency:: Thin  Precautions / Restrictions Precautions Precautions: Fall Precaution Comments: watch O2 Restrictions Weight Bearing Restrictions: No   Has the patient had 2 or more falls or a fall with injury in the past year?Yes; daughter reports pt. fell backward while squatting , sustaining a bleed in her brain  Prior Activity Level Community (5-7x/wk): pt was independent and active pta. Declined in past few weeks. Did not use AD and did own adls. Daughter local assists prn only  Development worker, international aid / [place]  Assistive Devices/Equipment: None Home Equipment: Walker - 2 wheels, Cane - single point, Grab bars - tub/shower  Prior Device Use: Indicate devices/aids used by the patient prior to current illness, exacerbation or injury? None of the above; no device used PTA  Prior Functional Level Prior Function Level of Independence: Independent Comments: fell once in past year with skull fx when fell backwards placing dish in cabinet  Self Care: Did the patient need help bathing, dressing, using the toilet or eating?  Independent  Indoor Mobility: Did the patient need assistance with walking from room to room (with or without device)? Independent; pt. Did not use any device indoors  Stairs: Did the patient need assistance with internal or external stairs (with or without device)? Independent  Functional Cognition: Did the patient need help planning regular tasks such as shopping or remembering to take medications? Needed some help; daughter reports some confusion for last year  Current Functional Level Cognition  Overall Cognitive Status: Impaired/Different from baseline Current Attention Level: Sustained Orientation Level: Oriented X4 Following Commands: Follows one step commands with increased time Safety/Judgement: Decreased awareness of safety, Decreased awareness of  deficits General Comments: Pt cannot figure out where she is by looking out the window or around the room. Requires multimodal cues for mobility in room    Extremity Assessment (includes Sensation/Coordination)  Upper Extremity Assessment: Overall WFL for tasks assessed  Lower Extremity Assessment: Defer to PT evaluation RLE Deficits / Details: grossly 3-/5 LLE Deficits / Details: grossly 3-/5    ADLs  Overall ADL's : Needs assistance/impaired Eating/Feeding: Set up, Sitting Eating/Feeding Details (indicate cue type and reason): drinking coffee and finishing breakfast Grooming: Oral care, Min guard, Standing Grooming Details (indicate cue type and reason): leaning heavily on sink with L UE and brushing with R UE. pt would require min (A) if not leaning with L UE Lower Body Bathing: Total assistance Lower Body Bathing Details (indicate cue type and reason): after voiding bowel for complete hygiene Toilet Transfer: Moderate assistance, Ambulation, Regular Toilet, Grab bars Toilet Transfer Details (indicate cue type and reason): requires (A) to position on toilet Toileting- Clothing Manipulation and Hygiene: Minimal assistance Functional mobility during ADLs: Moderate assistance (hand held (A) and scissored gait) General ADL Comments: Pt requesting to brush teeth on arrival. pt with scissored gait to bathroom and unawareness to LOB. pt needs (A) to complete adls due to cogntiive deficits. pt with improved balance exiting bathroom but remained unsteady. Uncertain on (A) level at home and spouse uses cane. Recommending CIR level care however patient may required SNF level if husband can not provide care in 7-14 days at a supervision / min (A) level for adls    Mobility  General bed mobility comments: pt in recliner on arrival    Transfers  Overall transfer level: Needs assistance Equipment used: Rolling walker (2 wheeled) Transfers: Sit to/from Stand Sit to Stand: +2 physical assistance,  Min assist General transfer comment: Assist to boost up to standing and to steady once standing.  Cues for hand placement during sit<>stand and to bring RW w/ her when turning to sit in chair.    Ambulation / Gait / Stairs / Wheelchair Mobility  Ambulation/Gait Ambulation/Gait assistance: Min assist, +2 safety/equipment Ambulation Distance (Feet): 55 Feet Assistive device: Rolling walker (2 wheeled) Gait Pattern/deviations: Step-through pattern, Decreased stride length, Trunk flexed General Gait Details: Min assist at times to manage RW in room and multimodal cues for proper turning using RW.  Noted Bil LE fatigue at end  of ambulation. Gait velocity interpretation: Below normal speed for age/gender    Posture / Balance Dynamic Sitting Balance Sitting balance - Comments: needs UE support to balance in sitting.  Balance Overall balance assessment: Needs assistance Sitting-balance support: Feet supported, Bilateral upper extremity supported Sitting balance-Leahy Scale: Fair Sitting balance - Comments: needs UE support to balance in sitting.  Postural control: Posterior lean Standing balance support: Bilateral upper extremity supported, During functional activity Standing balance-Leahy Scale: Poor Standing balance comment: Relies on RW w/ ocassional min assist to steady    Special needs/care consideration Oxygen Did not use oxygen pta currently at 2L O2 via nasal cannula Skin  Ecchymosis and skin tear bilateral LEs, L arm skin tear                              Bowel mgmt:  Last BM 07/18/14; continent Bladder mgmt:  continent Pt is  anxious at times Pt confusion new since admission   Previous Home Environment Living Arrangements: Spouse/significant other  Lives With: Spouse Available Help at Discharge: Family, Available 24 hours/day (spouse can provide supervision level only. He uses cane) Type of Home: House Home Layout: One level Home Access: Stairs to enter Entrance Stairs-Rails:  None Entrance Stairs-Number of Steps: 1 brick step Bathroom Shower/Tub: Chiropodist: Standard Bathroom Accessibility: Yes How Accessible: Accessible via walker Home Care Services: No Additional Comments: pt ambulates without assistive device and husband uses a cane.  Has had several falls per pt. Dtr, [name] lives near by and is retired. She drives pt's spouse to hospital daily and he comes up in a transport chair and uses a cane. Spouse cognitively intact.   Discharge Living Setting Plans for Discharge Living Setting: Patient's home, Lives with (comment) (52 year old spouse) Type of Home at Discharge: House Discharge Home Layout: One level Discharge Home Access: Stairs to enter Entrance Stairs-Rails: None Entrance Stairs-Number of Steps: 1 step Discharge Bathroom Shower/Tub: Tub/shower unit Discharge Bathroom Toilet: Standard Discharge Bathroom Accessibility: Yes How Accessible: Accessible via walker Does the patient have any problems obtaining your medications?: No  Social/Family/Support Systems Patient Roles: Spouse, Parent (two children, [name] here in [place] and second dtr in [place]) Contact Information: Herbie [place], spouse and daughter, [name] Anticipated Caregiver: [name] prn only and spouse is the supervision Anticipated Caregiver's Contact Information: see above Ability/Limitations of Caregiver: Spouse uses cane pta and is 90. He uses transport chair in hospital.  ([name] having hip replacement 08/04/15 unless she postpones surge) Caregiver Availability: 24/7 (spouse 24/7, [name] prn only) Discharge Plan Discussed with Primary Caregiver: Yes Is Caregiver In Agreement with Plan?: Yes Does Caregiver/Family have Issues with Lodging/Transportation while Pt is in Rehab?: No  [name], local dtr, has planned hip replacement 08/04/15. She may postpone surgery. But, she states she can not provide 24/7 supervision in her parents home.  Goals/Additional Needs Patient/Family Goal for Rehab:  MOd I to supervision with PT, OT, and SLP Expected length of stay: [facility] 13-17 days Pt/Family Agrees to Admission and willing to participate: Yes Program Orientation Provided & Reviewed with Pt/Caregiver Including Roles  & Responsibilities: Yes  Decrease burden of Care through IP rehab admission: n/a  Possible need for SNF placement upon discharge:If pt does not reach near Mod I level, SNF may be necessary for dtr, [name], has planned hip replacement 08/04/15 unless she postpones surgery. She still states that she would be unable to provide 24/7 supervision in her parents home.  Patient Condition: This patient's condition remains as documented in the consult dated 07/18/2015, in which the Rehabilitation Physician determined and documented that the patient's condition is appropriate for intensive rehabilitative care in an inpatient rehabilitation facility. Will admit to inpatient rehab today.  Preadmission Screen Completed By:  [name], 07/20/2015 11:42 AM ______________________________________________________________________   Discussed status with Dr. Letta Pate on 07/20/15 at  1141  and received telephone approval for admission today.  Admission Coordinator:  [name], time [id number] Sudie Grumbling 07/20/15

## 2015-07-19 NOTE — Progress Notes (Signed)
I met with pt, her spouse, and daughter at bedside to discuss her rehab venue options. [name], daughter, has planned hip replacement surgery for 08/04/15. She can not provide 24/7 assist in her parents home. She may investigate postponing her surgery but can still not provide 24/7 assist in her parents home. Pt, spouse , and daughter are all in agreement that pt will not be able to d/c directly home with [facility] as they previously preferred. They prefer inpt rehab stay for more aggressive therapy but will discuss overnight the possibility of SNF rehab. We will follow up in the morning. Dionne Milo will see pt Thursday in by absence. Her contact number is [phone]. I discussed with RN CM. [phone]

## 2015-07-19 NOTE — Progress Notes (Signed)
[facility] PROGRESS NOTE  Caroline Myers [id number] DOB: 03-Jul-1923 DOA: 07/15/2015 PCP:  Melinda Crutch, MD  Brief interval history Caroline Myers is a 82 y.o. female, with atrial fibrillation on Eliquis, anxiety, aortic stenosis, and a history of 2 CVAs. She presents to the emergency department via EMS with severe sepsis, hypotension, tachypnea, tachycardia and fever. Per her husband she is normally ambulatory at home. She cannot walk far, but she does perform all of her own ADLs and does not use a cane or walker. Her family mentions that over the past week she has developed a cough with yellow phlegm. When her coughing is severe she has had chest pain. They have noticed her becoming more short of breath walking even short distances. She saw her primary care physician on 1/13. He gave her a nebulizer and prescribed Xanax for severe anxiety. Overnight she decompensated and developed respiratory distress. When EMS evaluated her her systolic blood pressure was in the 60s, and her respiratory rate was in the 40s.  In the emergency department she has had a low-grade fever, 100.1, her lactic acid is 3.03 after 2 L of fluid, blood gas shows a PCO2 of 55.9, and a pH of 7.310, her blood pressure appears to be stabilizing with fluids - her systolic is now in the 0000000. Chest x-ray is clear, EKG shows atrial fibrillation. Flu swab is negative.  Patient was admitted to the step down unit pancultured placed empirically on IV vancomycin IV Zosyn as well as on the BiPAP. BiPAP was subsequently discontinued and Pulmicort and Brovana added to patient's nebulizers. Patient was also placed on IV steroids with improvement in the respiratory function. Patient was also noted to have elevated troponins which was felt to be secondary to a demand ischemia. Cardiology was consulted and and felt no further ischemic workup needed.   Assessment/Plan: #1 severe sepsis Likely secondary to community-acquired pneumonia.  Patient presented with sepsis criteria with tachycardia, tachypnea, low-grade fever, lactic acidosis, hypotension with systolic blood pressure in the 60s with a respiratory acidosis. Initial ABG and placed on the BiPAP. Patient has been pancultured cultures have been negative so far. Repeat chest x-ray on 07/16/2015 with questionable atelectasis versus infiltrate in the left lower lobe.  Continue empiric IV Zosyn. Discontinue IV vancomycin. Follow.  #2 acute respiratory failure with hypoxia Patient had presented with acute respiratory failure with hypoxia and had upper respiratory symptoms prior to admission. Patient also with complaints of worsening chronic shortness of breath over the past several months. Patient with a history of mild aortic stenosis. Patient currently off BiPAP and on nasal cannula, with clinical improvement. Patient did not get her IV Solu-Medrol the night of 07/16/2015. Repeat chest x-ray  07/16/2015 with questionable atelectasis versus infiltrate in the left lower lobe. Saline lock IV fluids. 2-D echo with EF of 50-55% with incoordinated in septal motion, mild aortic stenosis, moderate pulmonary hypertension. Influenza PCR is negative. One exam her breathing has improved, no wheezing heard. Plan for tapering the steroids soon and resume Pulmicort and Brovana,  Mucinex, nebulizers, Flonase, Claritin. DC IV vancomycin. Follow. Would plan to d/c IV zosyn soon.   #3 probable community-acquired pneumonia Patient endorses upper respiratory symptoms for a week prior to admission. Patient had presented with acute respiratory failure with hypoxia and hypotension. Sputum culturs have not been picked up. Mucinex to 1200 mg twice daily. Continue empiric IV Zosyn, Flonase, Claritin, nebulizers. DC IV vancomycin. Follow.  #4 hypotension Likely secondary to hypovolemia versus secondary to sepsis versus  cardiac etiology. Blood pressure has responded to IV fluids.  Patient has been pancultured and  results are negative so far. Repeat chest x-ray with questionable left lower lobe atelectasis versus infiltrate. Cardiac enzymes elevated.  2-D echo with EF of 50-55% with incoordinated in septal motion, mild aortic stenosis, moderate pulmonary hypertension. No further work up.   #5 elevated troponins Per cardiology likely a demand ischemia. Patient had presented with a history of worsening shortness of breath over the past several months. Patient also noted to be hypotensive on admission. Patient stated had a chest heaviness this morning which has since improved. Patient with history of mild aortic stenosis. Cardiac enzymes seem to have plateaued. 2-D echo with EF of 50-55% with a coordinated septal motion, mild aortic stenosis, moderate pulmonary hypertension. Continue home regimen of Cardizem.  No further ischemic workup needed per cardiology.   #6 mild aortic stenosis Patient states she's been having progressive worsening shortness of breath over the past several months. Repeat 2-D echo with EF of 50-55% with incoordinate in septal motion, mild aortic stenosis, moderate pulmonary hypertension. Per cardiology no further workup needed.   #7 lactic acidosis Likely secondary to problem #1 versus dehydration. Trending down. Patient has been pancultured. Continue empiric antibiotics.  #8 anxiety Continue Zoloft. Xanax was discontinued as patient was noted to be hallucinating. Hallucinations resolved.   #9 history of atrial fibrillation Heart rate in the 80s. Cardiac enzymes elevated however plateaued. 2-D echo with EF of 50-55% with incoordinate in septal motion. Mild aortic stenosis. Moderate pulmonary hypertension. Continue Cardizem for rate control. Continue eliquis.  #10 history of CVA Continue eliquis for secondary stroke prevention.  #11 pulmonary hypertension  #12 leukocytosis Maybe secondary to pneumonia and IV steroids. Patient has been pancultured results negative so far.  Discontinued  IV vancomycin. Currently on IV zosyn to be transitioned to oral antibiotics soon.   #13 prophylaxis On eliquis for DVT prophylaxis.  #14 loose watery bowel movements: Get c diff pcr.   Code Status: DO NOT RESUSCITATE Family Communication: Updated patient. No family at bedside. Called daughter and updated her.  Disposition Plan: Hopefully to inpatient rehabilitation versus SNF when medically stable. Patient still thinking about the rehab.    Consultants:  Cardiology: Dr. Gwenlyn Found 07/16/2015  Procedures:  Chest x-ray on 14 2017, 07/16/2015, 07/17/2015  2-D echo 07/16/2015  Antibiotics:  IV vancomycin 07/15/2015>>>>> 07/18/2015  IV Zosyn 07/15/2015  HPI/Subjective: Pt reports some cough, but feels better as she can be , she is still on 4 liters of [place] oxygen.  Plan to wean her off the oxygen in the next day .   Objective: Filed Vitals:   07/18/15 2123 07/19/15 0609  BP: 126/65 128/81  Pulse: 86 78  Temp: 98 F (36.7 C) 98.3 F (36.8 C)  Resp: 19 20    Intake/Output Summary (Last 24 hours) at 07/19/15 0942 Last data filed at 07/19/15 0000  Gross per 24 hour  Intake    580 ml  Output      0 ml  Net    580 ml   Filed Weights   07/16/15 0348 07/18/15 0322 07/19/15 0634  Weight: 69.3 kg (152 lb 12.5 oz) 72.5 kg (159 lb 13.3 oz) 72.439 kg (159 lb 11.2 oz)    Exam:   General:  NAD  Cardiovascular: Irregularly irregular  Respiratory:  Air entry fair, no wheezing or rhonchi.  No crackles.  Abdomen: Soft, nontender, nondistended, positive bowel sounds.  Musculoskeletal: No clubbing cyanosis or edema.  Data  Reviewed: Basic Metabolic Panel:  Recent Labs Lab 07/15/15 0628 07/16/15 0110 07/17/15 0232 07/18/15 0230 07/19/15 0445  NA 134* 137 138 140 140  K 4.0 3.8 3.7 3.7 3.6  CL 99* 103 106 107 108  CO2 26 25 24 26 26   GLUCOSE 146* 145* 135* 133* 144*  BUN 14 16 21* 17 29*  CREATININE 0.79 0.84 0.87 0.73 0.98  CALCIUM 8.3* 8.2* 8.2* 8.1* 8.2*  MG  --    --   --  2.1  --    Liver Function Tests:  Recent Labs Lab 07/16/15 0110  AST 53*  ALT 23  ALKPHOS 76  BILITOT 0.7  PROT 5.9*  ALBUMIN 2.8*   No results for input(s): LIPASE, AMYLASE in the last 168 hours. No results for input(s): AMMONIA in the last 168 hours. CBC:  Recent Labs Lab 07/15/15 0628 07/17/15 0232 07/18/15 0230 07/19/15 0445  WBC 9.8 19.7* 13.4* 16.0*  NEUTROABS 8.7*  --  12.6*  --   HGB 13.2 13.4 12.7 13.1  HCT 40.0 41.2 39.0 40.3  MCV 102.3* 101.7* 103.4* 102.5*  PLT 209 222 205 226   Cardiac Enzymes:  Recent Labs Lab 07/15/15 1942 07/16/15 0110 07/16/15 0820 07/16/15 1355 07/16/15 1857  TROPONINI 0.13* 0.11* 0.12* 0.12* 0.11*   BNP (last 3 results)  Recent Labs  07/15/15 0628  BNP 123.7*    ProBNP (last 3 results) No results for input(s): PROBNP in the last 8760 hours.  CBG: No results for input(s): GLUCAP in the last 168 hours.  Recent Results (from the past 240 hour(s))  Blood culture (routine x 2)     Status: None (Preliminary result)   Collection Time: 07/15/15  6:00 AM  Result Value Ref Range Status   Specimen Description BLOOD RIGHT ANTECUBITAL  Final   Special Requests BOTTLES DRAWN AEROBIC AND ANAEROBIC 10CC  Final   Culture NO GROWTH 3 DAYS  Final   Report Status PENDING  Incomplete  Blood culture (routine x 2)     Status: None (Preliminary result)   Collection Time: 07/15/15  6:15 AM  Result Value Ref Range Status   Specimen Description BLOOD RIGHT HAND  Final   Special Requests BOTTLES DRAWN AEROBIC ONLY 5CC  Final   Culture NO GROWTH 3 DAYS  Final   Report Status PENDING  Incomplete  MRSA PCR Screening     Status: None   Collection Time: 07/15/15  9:09 PM  Result Value Ref Range Status   MRSA by PCR NEGATIVE NEGATIVE Final    Comment:        The GeneXpert MRSA Assay (FDA approved for NASAL specimens only), is one component of a comprehensive MRSA colonization surveillance program. It is not intended to  diagnose MRSA infection nor to guide or monitor treatment for MRSA infections.   Culture, Urine     Status: None   Collection Time: 07/16/15  7:04 AM  Result Value Ref Range Status   Specimen Description URINE, RANDOM  Final   Special Requests NONE  Final   Culture NO GROWTH 1 DAY  Final   Report Status 07/17/2015 FINAL  Final     Studies: Dg Chest Port 1 View  07/17/2015  CLINICAL DATA:  Shortness of breath EXAM: PORTABLE CHEST 1 VIEW COMPARISON:  07/16/2015 FINDINGS: Cardiomegaly. Small bilateral pleural effusions with bibasilar atelectasis. No overt edema. No acute bony abnormality. Degenerative changes in the shoulders and thoracic spine. Rightward scoliosis of the thoracic spine. IMPRESSION: Cardiomegaly. Small bilateral pleural  effusions with bibasilar atelectasis. Electronically Signed   By: Rolm Baptise M.D.   On: 07/17/2015 10:18    Scheduled Meds: . antiseptic oral rinse  7 mL Mouth Rinse BID  . apixaban  5 mg Oral BID  . arformoterol  15 mcg Nebulization BID  . B-complex with vitamin C  1 tablet Oral Daily  . budesonide (PULMICORT) nebulizer solution  0.25 mg Nebulization BID  . diltiazem  180 mg Oral Daily  . fluticasone  2 spray Each Nare Daily  . guaiFENesin  1,200 mg Oral BID  . levalbuterol  0.63 mg Nebulization TID  . loratadine  10 mg Oral Daily  . piperacillin-tazobactam (ZOSYN)  IV  3.375 g Intravenous 3 times per day  . [START ON 07/20/2015] predniSONE  60 mg Oral Q breakfast  . sertraline  50 mg Oral Daily  . sodium chloride  3 mL Intravenous Q12H   Continuous Infusions:    Principal Problem:   Severe sepsis (HCC) Active Problems:   Anxiety state   Atrial fibrillation (HCC)   Bronchitis   Aortic valve disorder   SAH (subarachnoid hemorrhage) (HCC)   SDH (subdural hematoma) (HCC)   Benign essential HTN   CVA (cerebral infarction)   Stroke (HCC)   Chronic atrial fibrillation (HCC)   Chronic anticoagulation   Acute respiratory failure with  hypercapnia (HCC)   Hypotension   Elevated troponin   PNA (pneumonia)   Leukocytosis   Arterial hypotension   History of CVA (cerebrovascular accident)   Tachypnea    Time spent: 30 mins    Caroline Thurow MD [facility] Pager [phone]  If 7PM-7AM, please contact night-coverage at [email or web address], password [facility] 07/19/2015, 9:42 AM  LOS: 4 days

## 2015-07-19 NOTE — Progress Notes (Signed)
Physical Therapy Treatment Patient Details Name: Caroline Myers MRN: [id number] DOB: 1923/09/22 Today's Date: 07/19/2015    History of Present Illness Caroline Myers is a 82 y.o.female with known history of PAF, AoV stenosis, CVA X 2, on Eliquis with CHADS Score of 5, admitted with sepsis, hypotension, tachypnea, and fever. She had been having productive coughing with yellow phlegm and chest discomfort during coughing. Hallucinations as well.     PT Comments    Caroline Myers is oriented to person only and requires assist for safe mobility in her room today.  Pt will benefit from continued skilled PT services to increase functional independence and safety.  Follow Up Recommendations  CIR;Supervision/Assistance - 24 hour     Equipment Recommendations  Other (comment) (TBA)    Recommendations for Other Services Rehab consult     Precautions / Restrictions Precautions Precautions: Fall Precaution Comments: watch O2 Restrictions Weight Bearing Restrictions: No    Mobility  Bed Mobility               General bed mobility comments: pt in recliner on arrival  Transfers Overall transfer level: Needs assistance Equipment used: Rolling walker (2 wheeled) Transfers: Sit to/from Stand Sit to Stand: +2 physical assistance;Min assist         General transfer comment: Assist to boost up to standing and to steady once standing.  Cues for hand placement during sit<>stand and to bring RW w/ her when turning to sit in chair.  Ambulation/Gait Ambulation/Gait assistance: Min assist;+2 safety/equipment Ambulation Distance (Feet): 55 Feet Assistive device: Rolling walker (2 wheeled) Gait Pattern/deviations: Step-through pattern;Decreased stride length;Trunk flexed   Gait velocity interpretation: Below normal speed for age/gender General Gait Details: Min assist at times to manage RW in room and multimodal cues for proper turning using RW.  Noted Bil LE fatigue at end of  ambulation.   Stairs            Wheelchair Mobility    Modified Rankin (Stroke Patients Only)       Balance Overall balance assessment: Needs assistance Sitting-balance support: Feet supported;Bilateral upper extremity supported Sitting balance-Leahy Scale: Fair     Standing balance support: Bilateral upper extremity supported;During functional activity Standing balance-Leahy Scale: Poor Standing balance comment: Relies on RW w/ ocassional min assist to steady                    Cognition Arousal/Alertness: Awake/alert Behavior During Therapy: WFL for tasks assessed/performed Overall Cognitive Status: Impaired/Different from baseline Area of Impairment: Safety/judgement;Awareness;Problem solving;Orientation Orientation Level: Place;Time;Situation       Safety/Judgement: Decreased awareness of safety;Decreased awareness of deficits Awareness: Emergent Problem Solving: Difficulty sequencing;Requires verbal cues;Requires tactile cues General Comments: Pt cannot figure out where she is by looking out the window or around the room. Requires multimodal cues for mobility in room    Exercises General Exercises - Lower Extremity Ankle Circles/Pumps: AROM;Both;10 reps;Seated    General Comments        Pertinent Vitals/Pain Pain Assessment: No/denies pain    Home Living                      Prior Function            PT Goals (current goals can now be found in the care plan section) Acute Rehab PT Goals Patient Stated Goal: to get better and go to rehab PT Goal Formulation: With patient Time For Goal Achievement: 07/31/15 Potential to Achieve Goals: Good Progress towards  PT goals: Progressing toward goals    Frequency  Min 3X/week    PT Plan Current plan remains appropriate    Co-evaluation             End of Session Equipment Utilized During Treatment: Gait belt;Oxygen Activity Tolerance: Patient limited by fatigue Patient left:  in chair;with call bell/phone within reach;with chair alarm set     Time: [id number] PT Time Calculation (min) (ACUTE ONLY): 25 min  Charges:  $Gait Training: 23-37 mins                    G Codes:      Caroline Myers PT, [place] [phone] Pager: [phone] 07/19/2015, 4:21 PM

## 2015-07-20 ENCOUNTER — Inpatient Hospital Stay (HOSPITAL_COMMUNITY)
Admission: RE | Admit: 2015-07-20 | Discharge: 2015-07-29 | DRG: 193 | Disposition: A | Discharge status: Home Health | Payer: Medicare Other | Source: Intra-hospital | Attending: Physical Medicine & Rehabilitation | Admitting: Physical Medicine & Rehabilitation

## 2015-07-20 DIAGNOSIS — Z8673 Personal history of transient ischemic attack (TIA), and cerebral infarction without residual deficits: Secondary | ICD-10-CM

## 2015-07-20 DIAGNOSIS — F419 Anxiety disorder, unspecified: Secondary | ICD-10-CM | POA: Diagnosis present

## 2015-07-20 DIAGNOSIS — R05 Cough: Secondary | ICD-10-CM

## 2015-07-20 DIAGNOSIS — R0602 Shortness of breath: Secondary | ICD-10-CM

## 2015-07-20 DIAGNOSIS — F329 Major depressive disorder, single episode, unspecified: Secondary | ICD-10-CM | POA: Diagnosis present

## 2015-07-20 DIAGNOSIS — K5900 Constipation, unspecified: Secondary | ICD-10-CM | POA: Diagnosis not present

## 2015-07-20 DIAGNOSIS — E876 Hypokalemia: Secondary | ICD-10-CM | POA: Insufficient documentation

## 2015-07-20 DIAGNOSIS — D72829 Elevated white blood cell count, unspecified: Secondary | ICD-10-CM | POA: Diagnosis present

## 2015-07-20 DIAGNOSIS — I5033 Acute on chronic diastolic (congestive) heart failure: Secondary | ICD-10-CM

## 2015-07-20 DIAGNOSIS — R270 Ataxia, unspecified: Secondary | ICD-10-CM | POA: Diagnosis present

## 2015-07-20 DIAGNOSIS — R059 Cough, unspecified: Secondary | ICD-10-CM | POA: Insufficient documentation

## 2015-07-20 DIAGNOSIS — N179 Acute kidney failure, unspecified: Secondary | ICD-10-CM | POA: Diagnosis present

## 2015-07-20 DIAGNOSIS — I4891 Unspecified atrial fibrillation: Secondary | ICD-10-CM | POA: Diagnosis present

## 2015-07-20 DIAGNOSIS — R5381 Other malaise: Secondary | ICD-10-CM | POA: Diagnosis present

## 2015-07-20 DIAGNOSIS — I5031 Acute diastolic (congestive) heart failure: Secondary | ICD-10-CM | POA: Diagnosis present

## 2015-07-20 DIAGNOSIS — R4589 Other symptoms and signs involving emotional state: Secondary | ICD-10-CM | POA: Insufficient documentation

## 2015-07-20 DIAGNOSIS — J189 Pneumonia, unspecified organism: Secondary | ICD-10-CM | POA: Diagnosis not present

## 2015-07-20 DIAGNOSIS — I10 Essential (primary) hypertension: Secondary | ICD-10-CM | POA: Diagnosis present

## 2015-07-20 DIAGNOSIS — A419 Sepsis, unspecified organism: Secondary | ICD-10-CM | POA: Diagnosis present

## 2015-07-20 DIAGNOSIS — T380X5A Adverse effect of glucocorticoids and synthetic analogues, initial encounter: Secondary | ICD-10-CM | POA: Diagnosis present

## 2015-07-20 DIAGNOSIS — F418 Other specified anxiety disorders: Secondary | ICD-10-CM | POA: Insufficient documentation

## 2015-07-20 LAB — CULTURE, BLOOD (ROUTINE X 2)
CULTURE: NO GROWTH
CULTURE: NO GROWTH

## 2015-07-20 LAB — CBC
HEMATOCRIT: 45 % (ref 36.0–46.0)
HEMOGLOBIN: 14.2 g/dL (ref 12.0–15.0)
MCH: 32.3 pg (ref 26.0–34.0)
MCHC: 31.6 g/dL (ref 30.0–36.0)
MCV: 102.5 fL — AB (ref 78.0–100.0)
Platelets: 269 10*3/uL (ref 150–400)
RBC: 4.39 MIL/uL (ref 3.87–5.11)
RDW: 14.3 % (ref 11.5–15.5)
WBC: 16.9 10*3/uL — ABNORMAL HIGH (ref 4.0–10.5)

## 2015-07-20 MED ORDER — PREDNISONE 20 MG PO TABS: ORAL_TABLET | ORAL | Status: DC

## 2015-07-20 MED ORDER — FUROSEMIDE 20 MG PO TABS: 20.0000 mg | ORAL_TABLET | Freq: Every day | ORAL | Status: DC

## 2015-07-20 MED ORDER — APIXABAN 5 MG PO TABS
5.0000 mg | ORAL_TABLET | Freq: Two times a day (BID) | ORAL | Status: DC
  Administered 2015-07-20 – 2015-07-29 (×18): 5 mg via ORAL
  Filled 2015-07-20 (×18): qty 1

## 2015-07-20 MED ORDER — ACETAMINOPHEN 325 MG PO TABS: 325.0000 mg | ORAL_TABLET | ORAL | Status: DC | PRN

## 2015-07-20 MED ORDER — AMOXICILLIN-POT CLAVULANATE 875-125 MG PO TABS
1.0000 | ORAL_TABLET | Freq: Two times a day (BID) | ORAL | Status: DC
Start: 2015-07-21 — End: 2015-07-29

## 2015-07-20 MED ORDER — BUDESONIDE 0.25 MG/2ML IN SUSP
0.2500 mg | Freq: Two times a day (BID) | RESPIRATORY_TRACT | Status: DC
  Administered 2015-07-20 – 2015-07-29 (×15): 0.25 mg via RESPIRATORY_TRACT
  Filled 2015-07-20 (×19): qty 2

## 2015-07-20 MED ORDER — FUROSEMIDE 20 MG PO TABS
20.0000 mg | ORAL_TABLET | Freq: Every day | ORAL | Status: DC
Start: 2015-07-21 — End: 2015-07-20

## 2015-07-20 MED ORDER — ONDANSETRON HCL 4 MG PO TABS
4.0000 mg | ORAL_TABLET | Freq: Four times a day (QID) | ORAL | Status: DC | PRN
  Administered 2015-07-23: 4 mg via ORAL
  Filled 2015-07-20: qty 1

## 2015-07-20 MED ORDER — LORATADINE 10 MG PO TABS: 10.0000 mg | ORAL_TABLET | Freq: Every day | ORAL | Status: DC

## 2015-07-20 MED ORDER — ACETAMINOPHEN 650 MG RE SUPP: 650.0000 mg | Freq: Four times a day (QID) | RECTAL | Status: DC | PRN

## 2015-07-20 MED ORDER — BUDESONIDE-FORMOTEROL FUMARATE 80-4.5 MCG/ACT IN AERO: 2.0000 | INHALATION_SPRAY | Freq: Two times a day (BID) | RESPIRATORY_TRACT | Status: DC

## 2015-07-20 MED ORDER — PIPERACILLIN-TAZOBACTAM 3.375 G IVPB
3.3750 g | Freq: Three times a day (TID) | INTRAVENOUS | Status: DC
  Administered 2015-07-20 – 2015-07-21 (×2): 3.375 g via INTRAVENOUS
  Filled 2015-07-20 (×3): qty 50

## 2015-07-20 MED ORDER — FUROSEMIDE 20 MG PO TABS
20.0000 mg | ORAL_TABLET | Freq: Every day | ORAL | Status: AC
  Administered 2015-07-21: 20 mg via ORAL
  Filled 2015-07-20: qty 1

## 2015-07-20 MED ORDER — ONDANSETRON HCL 4 MG/2ML IJ SOLN: 4.0000 mg | Freq: Four times a day (QID) | INTRAMUSCULAR | Status: DC | PRN

## 2015-07-20 MED ORDER — FUROSEMIDE 10 MG/ML IJ SOLN
40.0000 mg | Freq: Once | INTRAMUSCULAR | Status: AC
  Administered 2015-07-20: 40 mg via INTRAVENOUS
  Filled 2015-07-20: qty 4

## 2015-07-20 MED ORDER — GUAIFENESIN ER 600 MG PO TB12
1200.0000 mg | ORAL_TABLET | Freq: Two times a day (BID) | ORAL | Status: DC
  Administered 2015-07-20 – 2015-07-29 (×18): 1200 mg via ORAL
  Filled 2015-07-20 (×19): qty 2

## 2015-07-20 MED ORDER — PREDNISONE 20 MG PO TABS: 60.0000 mg | ORAL_TABLET | Freq: Every day | ORAL | Status: DC

## 2015-07-20 MED ORDER — SERTRALINE HCL 50 MG PO TABS
50.0000 mg | ORAL_TABLET | Freq: Every day | ORAL | Status: DC
  Administered 2015-07-21 – 2015-07-29 (×9): 50 mg via ORAL
  Filled 2015-07-20 (×9): qty 1

## 2015-07-20 MED ORDER — SORBITOL 70 % SOLN: 30.0000 mL | Freq: Every day | Status: DC | PRN

## 2015-07-20 MED ORDER — ACETAMINOPHEN 325 MG PO TABS
650.0000 mg | ORAL_TABLET | Freq: Four times a day (QID) | ORAL | Status: DC | PRN
  Administered 2015-07-20 – 2015-07-29 (×5): 650 mg via ORAL
  Filled 2015-07-20 (×6): qty 2

## 2015-07-20 MED ORDER — FLUTICASONE PROPIONATE 50 MCG/ACT NA SUSP
2.0000 | Freq: Every day | NASAL | Status: DC
  Administered 2015-07-21 – 2015-07-29 (×9): 2 via NASAL
  Filled 2015-07-20 (×2): qty 16

## 2015-07-20 MED ORDER — SENNOSIDES-DOCUSATE SODIUM 8.6-50 MG PO TABS: 1.0000 | ORAL_TABLET | Freq: Every evening | ORAL | Status: DC | PRN

## 2015-07-20 MED ORDER — DILTIAZEM HCL ER COATED BEADS 180 MG PO CP24
180.0000 mg | ORAL_CAPSULE | Freq: Every day | ORAL | Status: DC
  Administered 2015-07-21 – 2015-07-29 (×9): 180 mg via ORAL
  Filled 2015-07-20 (×10): qty 1

## 2015-07-20 MED ORDER — LORATADINE 10 MG PO TABS
10.0000 mg | ORAL_TABLET | Freq: Every day | ORAL | Status: DC
  Administered 2015-07-21 – 2015-07-29 (×9): 10 mg via ORAL
  Filled 2015-07-20 (×9): qty 1

## 2015-07-20 MED ORDER — GUAIFENESIN-DM 100-10 MG/5ML PO SYRP: 5.0000 mL | ORAL_SOLUTION | ORAL | Status: DC | PRN

## 2015-07-20 MED ORDER — LEVALBUTEROL HCL 0.63 MG/3ML IN NEBU: 0.6300 mg | INHALATION_SOLUTION | Freq: Two times a day (BID) | RESPIRATORY_TRACT | Status: DC

## 2015-07-20 MED ORDER — LEVALBUTEROL HCL 0.63 MG/3ML IN NEBU
0.6300 mg | INHALATION_SOLUTION | Freq: Two times a day (BID) | RESPIRATORY_TRACT | Status: DC
  Administered 2015-07-20 – 2015-07-21 (×2): 0.63 mg via RESPIRATORY_TRACT
  Filled 2015-07-20 (×2): qty 3

## 2015-07-20 MED ORDER — B COMPLEX-C PO TABS
1.0000 | ORAL_TABLET | Freq: Every day | ORAL | Status: DC
  Administered 2015-07-21 – 2015-07-29 (×9): 1 via ORAL
  Filled 2015-07-20 (×11): qty 1

## 2015-07-20 MED ORDER — LEVALBUTEROL HCL 0.63 MG/3ML IN NEBU
0.6300 mg | INHALATION_SOLUTION | RESPIRATORY_TRACT | Status: DC | PRN
  Administered 2015-07-23 – 2015-07-24 (×3): 0.63 mg via RESPIRATORY_TRACT
  Filled 2015-07-20 (×3): qty 3

## 2015-07-20 MED ORDER — ARFORMOTEROL TARTRATE 15 MCG/2ML IN NEBU
15.0000 ug | INHALATION_SOLUTION | Freq: Two times a day (BID) | RESPIRATORY_TRACT | Status: DC
  Administered 2015-07-20 – 2015-07-29 (×14): 15 ug via RESPIRATORY_TRACT
  Filled 2015-07-20 (×21): qty 2

## 2015-07-20 NOTE — Progress Notes (Signed)
Patient ID: Caroline Myers, female   DOB: May 29, 1924, 82 y.o.   MRN: [id number] Patient admitted to [phone] via chair, escorted by nursing staff.  Patient verbalized understanding of rehab process, signed fall safety agreement.  Patient appears to be in no immediate distress at this time although patient is coughing quite a bit, unable to get anything up.  Flutter valve and incentive spirometer used.  O2 on at 2L via [place].  Brita Romp, RN

## 2015-07-20 NOTE — H&P (Signed)
Physical Medicine and Rehabilitation Admission H&P   Chief Complaint  Patient presents with  . Respiratory Distress  : HPI: Caroline Myers is a 82 y.o. right handed female with history of atrial fibrillation maintained on Eliquis, aortic stenosis, CVA 2. Patient lives with spouse one level home 1 step to entry. Pt and spouse help each other. Ambulates without assistive device but only short distances. She has had recent falls over the past few weeks. Daughter is currently assisting at home as patient's husband is limited. Presented 07/15/2015 with hypotension with systolic pressures in the 60s and tachypnea, tachycardia as well as low-grade fever, productive cough. Lactic acid level 3.03. Chest x-ray negative. Blood cultures completed showing no growth. Placed on empiric Zosyn. Follow-up chest x-ray bronchitic changes with questionable infiltrate versus atelectasis left lower lobe. Patient transitioned from intravenous Solu-Medrol to by mouth prednisone and taper as directed. Troponin mildly elevated 0.13 felt to be related to demand ischemia and follow-up per cardiology services. Placed on gentle IV fluids. Echocardiogram with ejection fraction of 55% and normal systolic function. Tolerating a regular consistency diet. Physical therapy evaluation completed 07/17/2015 with recommendations of physical medicine rehabilitation consult. Patient was admitted for comprehensive rehabilitation program  Patient states she has been forgetful since hospitalization but is able to give basic medical history. Denies any current pain complaints other than right shoulder when she elevates it. She states she's had previous shoulder surgery, arthroscopic. She's had some shortness of breath this morning repeat chest x-ray showed persistent effusion, IV Lasix repeated ROS Constitutional: Negative for chills.  HENT: Positive for hearing loss.  Eyes: Negative for blurred vision and double vision.   Respiratory: Positive for shortness of breath.  Cardiovascular: Negative for chest pain.  Gastrointestinal: Positive for constipation. Negative for nausea and vomiting.   GERD  Genitourinary: Negative for dysuria and hematuria.  Musculoskeletal: Positive for joint pain and falls.  Skin: Negative for rash.  Neurological: Negative for weakness and headaches.  Psychiatric/Behavioral:   Anxiety /depression All other systems reviewed and are negative   Past Medical History  Diagnosis Date  . Allergy     allergic rhinitis  . Cancer [facility])     history of skin cancer of the nose  . Asthma 2000    mild. tried Advair after bronchospasm after exposure to cats  . Shoulder pain, left 05/2010    with rotator cuff tear--Dr Alfonso Ramus  . Paroxysmal atrial fibrillation (HCC)     pt does not want anticoagulation  . Aortic stenosis     EF normal. Mild aortic stenosis. Mild to moderate aortic regurgitation  . Anxiety   . Shortness of breath     occ  . GERD (gastroesophageal reflux disease)     occ  . Arthritis   . Stroke [facility])    Past Surgical History  Procedure Laterality Date  . Hernia repair  2003  . Acromioplasty  2000    right shoulder, arthroscopic  . Lumbar laminectomy/decompression microdiscectomy  04/01/2012    Procedure: LUMBAR LAMINECTOMY/DECOMPRESSION MICRODISCECTOMY 1 LEVEL; Surgeon: Eustace Moore, MD; Location: [facility]; Service: Neurosurgery; Laterality: Right; Right Lumbar four-five extraforaminal microdiskectomy    Family History  Problem Relation Age of Onset  . Heart attack Neg Hx   . Stroke Sister   . Hypertension Neg Hx    Social History:  reports that she has never smoked. She has never used smokeless tobacco. She reports that she drinks alcohol. She reports that she does not use illicit drugs. Allergies:  Allergies  Allergen Reactions   . Tape Other (See Comments)    Rips skin   Medications Prior to Admission  Medication Sig Dispense Refill  . ALPRAZolam (XANAX) 0.25 MG tablet Take 0.25 mg by mouth 2 (two) times daily as needed for anxiety.     Marland Kitchen apixaban (ELIQUIS) 5 MG TABS tablet Take 1 tablet (5 mg total) by mouth 2 (two) times daily. 180 tablet 3  . B Complex-C (B-COMPLEX WITH VITAMIN C) tablet Take 1 tablet by mouth daily.    Marland Kitchen diltiazem (CARDIZEM CD) 180 MG 24 hr capsule Take 180 mg by mouth daily.    Marland Kitchen PROAIR HFA 108 (90 Base) MCG/ACT inhaler Inhale 1-2 puffs into the lungs every 4 (four) hours as needed for wheezing or shortness of breath.   0  . sertraline (ZOLOFT) 50 MG tablet Take 50 mg by mouth daily.  4    Home: Home Living Family/patient expects to be discharged to:: Private residence Living Arrangements: Spouse/significant other Available Help at Discharge: Family, Available 24 hours/day Type of Home: House Home Access: Stairs to enter [employer] of Steps: 1 brick step Entrance Stairs-Rails: None Home Layout: One level Bathroom Shower/Tub: Chiropodist: Standard Bathroom Accessibility: Yes Home Equipment: Environmental consultant - 2 wheels, Cane - single point, Grab bars - tub/shower Additional Comments: pt ambulates without assistive device and husband uses a cane. Has had several falls per pt.  Functional History: Prior Function Level of Independence: Independent  Functional Status:  Mobility: Bed Mobility General bed mobility comments: pt in recliner on arrival Transfers Overall transfer level: Needs assistance Equipment used: 2 person hand held assist Transfers: Sit to/from Stand Sit to Stand: Mod assist, +2 physical assistance General transfer comment: Pt needed assist to power up. Pt having difficulty with anterior lean due to posterior bias. Pt with bil knee instability and having difficulty with steadying herself needing  mod assist for steadying.  Ambulation/Gait Ambulation/Gait assistance: Min assist, Mod assist, +2 physical assistance Ambulation Distance (Feet): 2 Feet Assistive device: 2 person hand held assist Gait Pattern/deviations: Decreased stride length, Step-to pattern, Ataxic, Leaning posteriorly General Gait Details: Pt marched in place and was weight shifting but once pt tried to take a step forward, unable due to unsteady on feet even with +2 person assist.  Gait velocity interpretation: Below normal speed for age/gender    ADL:    Cognition: Cognition Overall Cognitive Status: Impaired/Different from baseline Orientation Level: Oriented X4 Cognition Arousal/Alertness: Awake/alert Behavior During Therapy: Anxious Overall Cognitive Status: Impaired/Different from baseline Area of Impairment: Safety/judgement, Awareness, Problem solving, Orientation Orientation Level: Place, Time, Situation Safety/Judgement: Decreased awareness of safety, Decreased awareness of deficits Awareness: Intellectual Problem Solving: Difficulty sequencing, Requires verbal cues, Requires tactile cues General Comments: Pt intermittently hallucinating.  Physical Exam: Blood pressure 129/99, pulse 83, temperature 97.6 F (36.4 C), temperature source Axillary, resp. rate 22, height 5' 5.5" (1.664 m), weight 72.5 kg (159 lb 13.3 oz), SpO2 95 %. Physical Exam Constitutional: She is oriented to person, place, and time. No distress.  Frail 82 year old Caucasian female  HENT: Negative JVD Head: Normocephalic.  Mouth/Throat: Oropharynx is clear and moist.  Eyes: Conjunctivae and EOM are normal.  Neck: Normal range of motion. Neck supple. No thyromegaly present.  Cardiovascular:  Irregular irregular  Respiratory: She has wheezes.  Decreased breath sounds at the bases with limited inspiratory effort +[place]  GI: Soft. Bowel sounds are normal. She exhibits no distension.  Neurological: She is alert and  oriented to person,  place, and time. She has normal reflexes.  Sensation intact to light touch Slightly confused at times Motor: B/l UE 4/5 proximal to distal B/l LE 4+/5 proximal to distal  Skin: Skin is warm and dry.  Ecchymosis dorsal right hand, Ecchymosis right anterior and medial leg Extremity: 1+ right pretibial edema 0 left pretibial edema  Psychiatric: Her behavior is normal. Judgment normal.  Talkative     Lab Results Last 48 Hours    Results for orders placed or performed during the hospital encounter of 07/15/15 (from the past 48 hour(s))  Troponin I (q 6hr x 3) Status: Abnormal   Collection Time: 07/16/15 1:55 PM  Result Value Ref Range   Troponin I 0.12 (H) <0.031 ng/mL    Comment:   PERSISTENTLY INCREASED TROPONIN VALUES IN THE RANGE OF 0.04-0.49 ng/mL CAN BE SEEN IN:  -UNSTABLE ANGINA  -CONGESTIVE HEART FAILURE  -MYOCARDITIS  -CHEST TRAUMA  -ARRYHTHMIAS  -LATE PRESENTING MYOCARDIAL INFARCTION  -COPD  CLINICAL FOLLOW-UP RECOMMENDED.   HIV antibody Status: None   Collection Time: 07/16/15 1:55 PM  Result Value Ref Range   HIV Screen 4th Generation wRfx Non Reactive Non Reactive    Comment: (NOTE) Performed At: [facility] [place], [place] [id number] Lindon Romp MD NW:[id number]   Troponin I (q 6hr x 3) Status: Abnormal   Collection Time: 07/16/15 6:57 PM  Result Value Ref Range   Troponin I 0.11 (H) <0.031 ng/mL    Comment:   PERSISTENTLY INCREASED TROPONIN VALUES IN THE RANGE OF 0.04-0.49 ng/mL CAN BE SEEN IN:  -UNSTABLE ANGINA  -CONGESTIVE HEART FAILURE  -MYOCARDITIS  -CHEST TRAUMA  -ARRYHTHMIAS  -LATE PRESENTING MYOCARDIAL INFARCTION  -COPD  CLINICAL FOLLOW-UP RECOMMENDED.   CBC Status: Abnormal   Collection Time: 07/17/15 2:32 AM  Result Value Ref Range    WBC 19.7 (H) 4.0 - 10.5 K/uL   RBC 4.05 3.87 - 5.11 MIL/uL   Hemoglobin 13.4 12.0 - 15.0 g/dL   HCT 41.2 36.0 - 46.0 %   MCV 101.7 (H) 78.0 - 100.0 fL   MCH 33.1 26.0 - 34.0 pg   MCHC 32.5 30.0 - 36.0 g/dL   RDW 14.2 11.5 - 15.5 %   Platelets 222 150 - 400 K/uL  Basic metabolic panel Status: Abnormal   Collection Time: 07/17/15 2:32 AM  Result Value Ref Range   Sodium 138 135 - 145 mmol/L   Potassium 3.7 3.5 - 5.1 mmol/L   Chloride 106 101 - 111 mmol/L   CO2 24 22 - 32 mmol/L   Glucose, Bld 135 (H) 65 - 99 mg/dL   BUN 21 (H) 6 - 20 mg/dL   Creatinine, Ser 0.87 0.44 - 1.00 mg/dL   Calcium 8.2 (L) 8.9 - 10.3 mg/dL   GFR calc non Af Amer 57 (L) >60 mL/min   GFR calc Af Amer >60 >60 mL/min    Comment: (NOTE) The eGFR has been calculated using the CKD EPI equation. This calculation has not been validated in all clinical situations. eGFR's persistently <60 mL/min signify possible Chronic Kidney Disease.    Anion gap 8 5 - 15  CBC with Differential/Platelet Status: Abnormal   Collection Time: 07/18/15 2:30 AM  Result Value Ref Range   WBC 13.4 (H) 4.0 - 10.5 K/uL   RBC 3.77 (L) 3.87 - 5.11 MIL/uL   Hemoglobin 12.7 12.0 - 15.0 g/dL   HCT 39.0 36.0 - 46.0 %   MCV 103.4 (H) 78.0 - 100.0 fL  MCH 33.7 26.0 - 34.0 pg   MCHC 32.6 30.0 - 36.0 g/dL   RDW 14.5 11.5 - 15.5 %   Platelets 205 150 - 400 K/uL   Neutrophils Relative % 94 %   Neutro Abs 12.6 (H) 1.7 - 7.7 K/uL   Lymphocytes Relative 4 %   Lymphs Abs 0.5 (L) 0.7 - 4.0 K/uL   Monocytes Relative 2 %   Monocytes Absolute 0.3 0.1 - 1.0 K/uL   Eosinophils Relative 0 %   Eosinophils Absolute 0.0 0.0 - 0.7 K/uL   Basophils Relative 0 %   Basophils Absolute 0.0 0.0 - 0.1 K/uL  Basic metabolic panel Status: Abnormal   Collection Time: 07/18/15  2:30 AM  Result Value Ref Range   Sodium 140 135 - 145 mmol/L   Potassium 3.7 3.5 - 5.1 mmol/L   Chloride 107 101 - 111 mmol/L   CO2 26 22 - 32 mmol/L   Glucose, Bld 133 (H) 65 - 99 mg/dL   BUN 17 6 - 20 mg/dL   Creatinine, Ser 0.73 0.44 - 1.00 mg/dL   Calcium 8.1 (L) 8.9 - 10.3 mg/dL   GFR calc non Af Amer >60 >60 mL/min   GFR calc Af Amer >60 >60 mL/min    Comment: (NOTE) The eGFR has been calculated using the CKD EPI equation. This calculation has not been validated in all clinical situations. eGFR's persistently <60 mL/min signify possible Chronic Kidney Disease.    Anion gap 7 5 - 15  Magnesium Status: None   Collection Time: 07/18/15 2:30 AM  Result Value Ref Range   Magnesium 2.1 1.7 - 2.4 mg/dL  Vancomycin, trough Status: None   Collection Time: 07/18/15 6:40 AM  Result Value Ref Range   Vancomycin Tr 13 10.0 - 20.0 ug/mL      Imaging Results (Last 48 hours)    Dg Chest Port 1 View  07/17/2015 CLINICAL DATA: Shortness of breath EXAM: PORTABLE CHEST 1 VIEW COMPARISON: 07/16/2015 FINDINGS: Cardiomegaly. Small bilateral pleural effusions with bibasilar atelectasis. No overt edema. No acute bony abnormality. Degenerative changes in the shoulders and thoracic spine. Rightward scoliosis of the thoracic spine. IMPRESSION: Cardiomegaly. Small bilateral pleural effusions with bibasilar atelectasis. Electronically Signed By: Rolm Baptise M.D. On: 07/17/2015 10:18        Medical Problem List and Plan: 1. Debilitation/acute respiratory failure secondary to sepsis/pneumonia. Continue nebulizer as directed. Plan taper of prednisone. Maintain Zosyn for now. Follow-up chest x-ray 07/22/2015 2. DVT Prophylaxis/Anticoagulation: Eliquis 3. Pain Management: Tylenol 4. Mood: Zoloft 50 mg daily. Provide emotional support 5. Neuropsych: This patient is capable of making decisions on her own  behalf. 6. Skin/Wound Care: Routine skin checks 7. Fluids/Electrolytes/Nutrition: Routine I&O's with follow-up chemistries 8. Atrial fibrillation. Cardiac rate control. Continue Cardizem 180 mg daily     Post Admission Physician Evaluation: 1. Functional deficits secondary to Deconditioning following pneumonia as well as acute on chronic CHF. 2. Patient is admitted to receive collaborative, interdisciplinary care between the physiatrist, rehab nursing staff, and therapy team. 3. Patient's level of medical complexity and substantial therapy needs in context of that medical necessity cannot be provided at a lesser intensity of care such as a SNF. 4. Patient has experienced substantial functional loss from his/her baseline which was documented above under the "Functional History" and "Functional Status" headings. Judging by the patient's diagnosis, physical exam, and functional history, the patient has potential for functional progress which will result in measurable gains while on inpatient rehab. These gains will be  of substantial and practical use upon discharge in facilitating mobility and self-care at the household level. 5. Physiatrist will provide 24 hour management of medical needs as well as oversight of the therapy plan/treatment and provide guidance as appropriate regarding the interaction of the two. 6. 24 hour rehab nursing will assist with bladder management, bowel management, safety, skin/wound care, disease management, medication administration, pain management and patient education and help integrate therapy concepts, techniques,education, etc. 7. PT will assess and treat for/with: pre gait, gait training, endurance , safety, equipment, neuromuscular re education. Goals are: Modified independent. 8. OT will assess and treat for/with: ADLs, Cognitive perceptual skills, Neuromuscular re education, safety, endurance, equipment. Goals are: Modified independent for ADLs. Therapy May  proceed with showering this patient. 9. SLP will assess and treat for/with: Assess memory attention sequencing, problem solving. Goals are: Modified independent with medication management. 10. Case Management and Social Worker will assess and treat for psychological issues and discharge planning. 11. Team conference will be held weekly to assess progress toward goals and to determine barriers to discharge. 12. Patient will receive at least 3 hours of therapy per day at least 5 days per week. 13. ELOS: 7-10 days  14. Prognosis: good     Charlett Blake M.D. [facility] FAAPM&R (Sports Med, Neuromuscular Med) Diplomate Am Board of Electrodiagnostic Med  07/18/2015

## 2015-07-20 NOTE — Progress Notes (Signed)
[name] Rehab Admission Coordinator Signed Physical Medicine and Rehabilitation PMR Pre-admission 07/19/2015 6:49 PM  Related encounter: ED to Hosp-Admission (Current) from 07/15/2015 in [place] Collapse All   PMR Admission Coordinator Pre-Admission Assessment  Patient: Caroline Myers is an 82 y.o., female MRN: [id number] DOB: [birth year] Height: 5' 5.5" (166.4 cm) Weight: 72.122 kg (159 lb)  Insurance Information HMO: PPO: PCP: IPA: 80/20: yes OTHER: No HMO PRIMARY: Medicare a and b Policy#: [id number] Subscriber: pt Benefits: Phone #: Passport one online Name: 07/19/15 Eff. Date: 06/01/91 Deduct: $1316 Out of Pocket Max: none Life Max: none CIR: 100% SNF: 20 full days Outpatient: 80% Co-Pay: 20% Home Health: 100% Co-Pay: none DME: 80% Co-Pay: 20% Providers: pt choice  SECONDARY: [place] Policy#: [id number] Subscriber: pt No auth required  Medicaid Application Date: Case Manager:  Disability Application Date: Case Worker:   Emergency Contact Information Contact Information    Name Relation Home Work [place] C Spouse [place] Daughter [phone]       Current Medical History  Patient Admitting Diagnosis: debilitation secondary to sepsis with acute respiratory failure  History of Present Illness: Caroline Myers is a 82 y.o. right handed female with history of atrial fibrillation maintained on Eliquis, aortic stenosis, CVA 2. Patient lives with spouse one level home 1 step to entry. Pt and spouse help each other. Ambulates without assistive device but only short distances. She has had recent falls over the past few weeks.  Daughter is currently assisting at home as patient's husband is limited. Presented 07/15/2015 with hypotension with systolic pressures in the 60s and tachypnea, tachycardia as well as low-grade fever, productive cough. Lactic acid level 3.03. Chest x-ray negative. Blood cultures completed showing no growth. Placed on broad-spectrum antibiotics. Follow-up chest x-ray bronchitic changes with questionable infiltrate versus atelectasis left lower lobe. Troponin mildly elevated 0.13 felt to be related to demand ischemia and follow-up per cardiology services. Placed on gentle IV fluids. Echocardiogram with ejection fraction of 55% and normal systolic function. Tolerating a regular consistency diet.   Past Medical History  Past Medical History  Diagnosis Date  . Allergy     allergic rhinitis  . Cancer [facility])     history of skin cancer of the nose  . Asthma 2000    mild. tried Advair after bronchospasm after exposure to cats  . Shoulder pain, left 05/2010    with rotator cuff tear--Dr Alfonso Ramus  . Paroxysmal atrial fibrillation (HCC)     pt does not want anticoagulation  . Aortic stenosis     EF normal. Mild aortic stenosis. Mild to moderate aortic regurgitation  . Anxiety   . Shortness of breath     occ  . GERD (gastroesophageal reflux disease)     occ  . Arthritis   . Stroke [facility])     Family History  family history includes Stroke in her sister. There is no history of Heart attack or Hypertension.  Prior Rehab/Hospitalizations:  Has the patient had major surgery during 100 days prior to admission? No Has only had HH or OP therapy in the past  Current Medications   Current facility-administered medications:  . acetaminophen (TYLENOL) tablet 650 mg, 650 mg, Oral, Q6H PRN, 650 mg at 07/19/15 0017 **OR** acetaminophen (TYLENOL) suppository 650 mg, 650 mg, Rectal, Q6H PRN, Bobby Rumpf York, PA-C . alum & mag hydroxide-simeth  (MAALOX/MYLANTA) 200-200-20 MG/5ML suspension 30  mL, 30 mL, Oral, Q6H PRN, Bobby Rumpf York, PA-C . antiseptic oral rinse (CPC / CETYLPYRIDINIUM CHLORIDE 0.05%) solution 7 mL, 7 mL, Mouth Rinse, BID, Eugenie Filler, MD, 7 mL at 07/20/15 0859 . apixaban (ELIQUIS) tablet 5 mg, 5 mg, Oral, BID, Bobby Rumpf York, PA-C, 5 mg at 07/20/15 0858 . arformoterol (BROVANA) nebulizer solution 15 mcg, 15 mcg, Nebulization, BID, Eugenie Filler, MD, 15 mcg at 07/20/15 0911 . B-complex with vitamin C tablet 1 tablet, 1 tablet, Oral, Daily, Melton Alar, PA-C, 1 tablet at 07/19/15 0943 . budesonide (PULMICORT) nebulizer solution 0.25 mg, 0.25 mg, Nebulization, BID, Eugenie Filler, MD, 0.25 mg at 07/20/15 0912 . diltiazem (CARDIZEM CD) 24 hr capsule 180 mg, 180 mg, Oral, Daily, Melton Alar, PA-C, 180 mg at 07/20/15 0858 . fluticasone (FLONASE) 50 MCG/ACT nasal spray 2 spray, 2 spray, Each Nare, Daily, Eugenie Filler, MD, 2 spray at 07/17/15 1042 . guaiFENesin (MUCINEX) 12 hr tablet 1,200 mg, 1,200 mg, Oral, BID, Eugenie Filler, MD, 1,200 mg at 07/20/15 0859 . guaiFENesin-dextromethorphan (ROBITUSSIN DM) 100-10 MG/5ML syrup 5 mL, 5 mL, Oral, Q4H PRN, Irine Seal V, MD, 5 mL at 07/19/15 0531 . levalbuterol (XOPENEX) nebulizer solution 0.63 mg, 0.63 mg, Nebulization, Q2H PRN, Eugenie Filler, MD, 0.63 mg at 07/19/15 0232 . levalbuterol (XOPENEX) nebulizer solution 0.63 mg, 0.63 mg, Nebulization, BID, Marianne L York, PA-C . loratadine (CLARITIN) tablet 10 mg, 10 mg, Oral, Daily, Irine Seal V, MD, 10 mg at 07/20/15 0858 . ondansetron (ZOFRAN) tablet 4 mg, 4 mg, Oral, Q6H PRN **OR** ondansetron (ZOFRAN) injection 4 mg, 4 mg, Intravenous, Q6H PRN, Bobby Rumpf York, PA-C, 4 mg at 07/17/15 0320 . piperacillin-tazobactam (ZOSYN) IVPB 3.375 g, 3.375 g, Intravenous, 3 times per day, Meagan A Decker, RPH, Last Rate: 12.5 mL/hr at 07/20/15 0548, 3.375 g at 07/20/15 0548 . predniSONE  (DELTASONE) tablet 60 mg, 60 mg, Oral, Q breakfast, Hosie Poisson, MD, 60 mg at 07/20/15 0858 . senna-docusate (Senokot-S) tablet 1 tablet, 1 tablet, Oral, QHS PRN, Bobby Rumpf York, PA-C . sertraline (ZOLOFT) tablet 50 mg, 50 mg, Oral, Daily, Melton Alar, PA-C, 50 mg at 07/20/15 0858 . sodium chloride 0.9 % injection 3 mL, 3 mL, Intravenous, Q12H, Marianne L York, PA-C, 3 mL at 07/19/15 2120 . zolpidem (AMBIEN) tablet 5 mg, 5 mg, Oral, QHS PRN, Eugenie Filler, MD, 5 mg at 07/19/15 2303  Patients Current Diet: Diet Heart Room service appropriate?: Yes; Fluid consistency:: Thin  Precautions / Restrictions Precautions Precautions: Fall Precaution Comments: watch O2 Restrictions Weight Bearing Restrictions: No   Has the patient had 2 or more falls or a fall with injury in the past year?Yes; daughter reports pt. fell backward while squatting , sustaining a bleed in her brain  Prior Activity Level Community (5-7x/wk): pt was independent and active pta. Declined in past few weeks. Did not use AD and did own adls. Daughter local assists prn only  Development worker, international aid / [place] Devices/Equipment: None Home Equipment: Environmental consultant - 2 wheels, [place] - single point, Grab bars - tub/shower  Prior Device Use: Indicate devices/aids used by the patient prior to current illness, exacerbation or injury? None of the above; no device used PTA  Prior Functional Level Prior Function Level of Independence: Independent Comments: fell once in past year with skull fx when fell backwards placing dish in cabinet  Self Care: Did the patient need help bathing, dressing, using the toilet or eating? Independent  Indoor  Mobility: Did the patient need assistance with walking from room to room (with or without device)? Independent; pt. Did not use any device indoors  Stairs: Did the patient need assistance with internal or external stairs (with or without device)? Independent  Functional  Cognition: Did the patient need help planning regular tasks such as shopping or remembering to take medications? Needed some help; daughter reports some confusion for last year  Current Functional Level Cognition  Overall Cognitive Status: Impaired/Different from baseline Current Attention Level: Sustained Orientation Level: Oriented X4 Following Commands: Follows one step commands with increased time Safety/Judgement: Decreased awareness of safety, Decreased awareness of deficits General Comments: Pt cannot figure out where she is by looking out the window or around the room. Requires multimodal cues for mobility in room   Extremity Assessment (includes Sensation/Coordination)  Upper Extremity Assessment: Overall WFL for tasks assessed  Lower Extremity Assessment: Defer to PT evaluation RLE Deficits / Details: grossly 3-/5 LLE Deficits / Details: grossly 3-/5    ADLs  Overall ADL's : Needs assistance/impaired Eating/Feeding: Set up, Sitting Eating/Feeding Details (indicate cue type and reason): drinking coffee and finishing breakfast Grooming: Oral care, Min guard, Standing Grooming Details (indicate cue type and reason): leaning heavily on sink with L UE and brushing with R UE. pt would require min (A) if not leaning with L UE Lower Body Bathing: Total assistance Lower Body Bathing Details (indicate cue type and reason): after voiding bowel for complete hygiene Toilet Transfer: Moderate assistance, Ambulation, Regular Toilet, Grab bars Toilet Transfer Details (indicate cue type and reason): requires (A) to position on toilet Toileting- Clothing Manipulation and Hygiene: Minimal assistance Functional mobility during ADLs: Moderate assistance (hand held (A) and scissored gait) General ADL Comments: Pt requesting to brush teeth on arrival. pt with scissored gait to bathroom and unawareness to LOB. pt needs (A) to complete adls due to cogntiive deficits. pt with improved balance  exiting bathroom but remained unsteady. Uncertain on (A) level at home and spouse uses cane. Recommending CIR level care however patient may required SNF level if husband can not provide care in 7-14 days at a supervision / min (A) level for adls    Mobility  General bed mobility comments: pt in recliner on arrival    Transfers  Overall transfer level: Needs assistance Equipment used: Rolling walker (2 wheeled) Transfers: Sit to/from Stand Sit to Stand: +2 physical assistance, Min assist General transfer comment: Assist to boost up to standing and to steady once standing. Cues for hand placement during sit<>stand and to bring RW w/ her when turning to sit in chair.    Ambulation / Gait / Stairs / Wheelchair Mobility  Ambulation/Gait Ambulation/Gait assistance: Min assist, +2 safety/equipment Ambulation Distance (Feet): 55 Feet Assistive device: Rolling walker (2 wheeled) Gait Pattern/deviations: Step-through pattern, Decreased stride length, Trunk flexed General Gait Details: Min assist at times to manage RW in room and multimodal cues for proper turning using RW. Noted Bil LE fatigue at end of ambulation. Gait velocity interpretation: Below normal speed for age/gender    Posture / Balance Dynamic Sitting Balance Sitting balance - Comments: needs UE support to balance in sitting.  Balance Overall balance assessment: Needs assistance Sitting-balance support: Feet supported, Bilateral upper extremity supported Sitting balance-Leahy Scale: Fair Sitting balance - Comments: needs UE support to balance in sitting.  Postural control: Posterior lean Standing balance support: Bilateral upper extremity supported, During functional activity Standing balance-Leahy Scale: Poor Standing balance comment: Relies on RW w/ ocassional min assist to  steady    Special needs/care consideration Oxygen Did not use oxygen pta currently at 2L O2 via nasal cannula Skin Ecchymosis and skin  tear bilateral LEs, L arm skin tear  Bowel mgmt: Last BM 07/18/14; continent Bladder mgmt: continent Pt is anxious at times Pt confusion new since admission   Previous Home Environment Living Arrangements: Spouse/significant other Lives With: Spouse Available Help at Discharge: Family, Available 24 hours/day (spouse can provide supervision level only. He uses cane) Type of Home: House Home Layout: One level Home Access: Stairs to enter Entrance Stairs-Rails: None Entrance Stairs-Number of Steps: 1 brick step Bathroom Shower/Tub: Chiropodist: Standard Bathroom Accessibility: Yes How Accessible: Accessible via walker Home Care Services: No Additional Comments: pt ambulates without assistive device and husband uses a cane. Has had several falls per pt. Dtr, [name] lives near by and is retired. She drives pt's spouse to hospital daily and he comes up in a transport chair and uses a cane. Spouse cognitively intact.   Discharge Living Setting Plans for Discharge Living Setting: Patient's home, Lives with (comment) (18 year old spouse) Type of Home at Discharge: House Discharge Home Layout: One level Discharge Home Access: Stairs to enter Entrance Stairs-Rails: None Entrance Stairs-Number of Steps: 1 step Discharge Bathroom Shower/Tub: Tub/shower unit Discharge Bathroom Toilet: Standard Discharge Bathroom Accessibility: Yes How Accessible: Accessible via walker Does the patient have any problems obtaining your medications?: No  Social/Family/Support Systems Patient Roles: Spouse, Parent (two children, [name] here in [place] and second dtr in [place]) Contact Information: Herbie [place], spouse and daughter, [name] Anticipated Caregiver: [name] prn only and spouse is the supervision Anticipated Caregiver's Contact Information: see above Ability/Limitations of Caregiver: Spouse uses cane pta and is 90. He uses transport chair in hospital. ([name]  having hip replacement 08/04/15 unless she postpones surge) Caregiver Availability: 24/7 (spouse 24/7, [name] prn only) Discharge Plan Discussed with Primary Caregiver: Yes Is Caregiver In Agreement with Plan?: Yes Does Caregiver/Family have Issues with Lodging/Transportation while Pt is in Rehab?: No  [name], local dtr, has planned hip replacement 08/04/15. She may postpone surgery. But, she states she can not provide 24/7 supervision in her parents home.  Goals/Additional Needs Patient/Family Goal for Rehab: MOd I to supervision with PT, OT, and SLP Expected length of stay: ELOS 13-17 days Pt/Family Agrees to Admission and willing to participate: Yes Program Orientation Provided & Reviewed with Pt/Caregiver Including Roles & Responsibilities: Yes  Decrease burden of Care through IP rehab admission: n/a  Possible need for SNF placement upon discharge:If pt does not reach near Mod I level, SNF may be necessary for dtr, [name], has planned hip replacement 08/04/15 unless she postpones surgery. She still states that she would be unable to provide 24/7 supervision in her parents home.  Patient Condition: This patient's condition remains as documented in the consult dated 07/18/2015, in which the Rehabilitation Physician determined and documented that the patient's condition is appropriate for intensive rehabilitative care in an inpatient rehabilitation facility. Will admit to inpatient rehab today.  Preadmission Screen Completed By: [name], 07/20/2015 11:42 AM ______________________________________________________________________  Discussed status with Dr. Letta Pate on 07/20/15 at 1141 and received telephone approval for admission today.  Admission Coordinator: [name], time [id number] /Date 07/20/15          Cosigned by: Charlett Blake, MD at 07/20/2015 11:49 AM  Revision History     Date/Time User Provider Type Action   07/20/2015 11:49 AM Charlett Blake, MD Physician  Cosign   07/20/2015  11:43 AM [name] Rehab Admission Coordinator Sign   07/19/2015 7:04 PM Cristina Gong, RN Rehab Admission Coordinator Share   View Details Report

## 2015-07-20 NOTE — Progress Notes (Signed)
Ankit Lorie Phenix, MD Physician Signed Physical Medicine and Rehabilitation Consult Note 07/18/2015 5:54 AM  Related encounter: ED to Hosp-Admission (Current) from 07/15/2015 in [place] [facility]    Expand All Collapse All        Physical Medicine and Rehabilitation Consult Reason for Consult: Debilitation secondary to sepsis with acute respiratory failure Referring Physician: [facility]   HPI: Caroline Myers is a 82 y.o. right handed female with history of atrial fibrillation maintained on Eliquis, aortic stenosis, CVA 2. Patient lives with spouse one level home 1 step to entry. Pt and spouse help each other. Ambulates without assistive device but only short distances. She has had recent falls over the past few weeks. Daughter is currently assisting at home as patient's husband is limited. Presented 07/15/2015 with hypotension with systolic pressures in the 60s and tachypnea, tachycardia as well as low-grade fever, productive cough. Lactic acid level 3.03. Chest x-ray negative. Blood cultures completed showing no growth. Placed on broad-spectrum antibiotics. Follow-up chest x-ray bronchitic changes with questionable infiltrate versus atelectasis left lower lobe. Troponin mildly elevated 0.13 felt to be related to demand ischemia and follow-up per cardiology services. Placed on gentle IV fluids. Echocardiogram with ejection fraction of 55% and normal systolic function. Tolerating a regular consistency diet. Physical therapy evaluation completed 07/17/2015 with recommendations of physical medicine rehabilitation consult.   Review of Systems  Constitutional: Negative for chills.  HENT: Positive for hearing loss.  Eyes: Negative for blurred vision and double vision.  Respiratory: Positive for shortness of breath.  Cardiovascular: Negative for chest pain.  Gastrointestinal: Positive for constipation. Negative for nausea and vomiting.   GERD  Genitourinary: Negative  for dysuria and hematuria.  Musculoskeletal: Positive for joint pain and falls.  Skin: Negative for rash.  Neurological: Negative for weakness and headaches.  Psychiatric/Behavioral:   Anxiety  All other systems reviewed and are negative.  Past Medical History  Diagnosis Date  . Allergy     allergic rhinitis  . Cancer [facility])     history of skin cancer of the nose  . Asthma 2000    mild. tried Advair after bronchospasm after exposure to cats  . Shoulder pain, left 05/2010    with rotator cuff tear--Dr Alfonso Ramus  . Paroxysmal atrial fibrillation (HCC)     pt does not want anticoagulation  . Aortic stenosis     EF normal. Mild aortic stenosis. Mild to moderate aortic regurgitation  . Anxiety   . Shortness of breath     occ  . GERD (gastroesophageal reflux disease)     occ  . Arthritis   . Stroke [facility])    Past Surgical History  Procedure Laterality Date  . Hernia repair  2003  . Acromioplasty  2000    right shoulder, arthroscopic  . Lumbar laminectomy/decompression microdiscectomy  04/01/2012    Procedure: LUMBAR LAMINECTOMY/DECOMPRESSION MICRODISCECTOMY 1 LEVEL; Surgeon: Eustace Moore, MD; Location: [place] [facility]; Service: Neurosurgery; Laterality: Right; Right Lumbar four-five extraforaminal microdiskectomy    Family History  Problem Relation Age of Onset  . Heart attack Neg Hx   . Stroke Sister   . Hypertension Neg Hx    Social History:  reports that she has never smoked. She has never used smokeless tobacco. She reports that she drinks alcohol. She reports that she does not use illicit drugs. Allergies:  Allergies  Allergen Reactions  . Tape Other (See Comments)    Rips skin   Medications Prior to Admission  Medication  Sig Dispense Refill  . ALPRAZolam (XANAX) 0.25 MG tablet Take 0.25 mg by mouth 2 (two) times daily as needed for anxiety.       Marland Kitchen apixaban (ELIQUIS) 5 MG TABS tablet Take 1 tablet (5 mg total) by mouth 2 (two) times daily. 180 tablet 3  . B Complex-C (B-COMPLEX WITH VITAMIN C) tablet Take 1 tablet by mouth daily.    Marland Kitchen diltiazem (CARDIZEM CD) 180 MG 24 hr capsule Take 180 mg by mouth daily.    Marland Kitchen PROAIR HFA 108 (90 Base) MCG/ACT inhaler Inhale 1-2 puffs into the lungs every 4 (four) hours as needed for wheezing or shortness of breath.   0  . sertraline (ZOLOFT) 50 MG tablet Take 50 mg by mouth daily.  4    Home: Home Living Family/patient expects to be discharged to:: Private residence Living Arrangements: Spouse/significant other Available Help at Discharge: Family, Available 24 hours/day (Daughter is staying with husband as pt usually A him) Type of Home: House Home Access: Stairs to enter [employer] of Steps: 1 brick step Entrance Stairs-Rails: None Home Layout: One level Bathroom Shower/Tub: Chiropodist: Standard Bathroom Accessibility: Yes Home Equipment: Environmental consultant - 2 wheels, Cane - single point, Grab bars - tub/shower Additional Comments: pt ambulates without assistive device and husband uses a cane. Has had several falls per pt.  Functional History: Prior Function Level of Independence: Independent Functional Status:  Mobility: Bed Mobility General bed mobility comments: pt in recliner on arrival Transfers Overall transfer level: Needs assistance Equipment used: 2 person hand held assist Transfers: Sit to/from Stand Sit to Stand: Mod assist, +2 physical assistance General transfer comment: Pt needed assist to power up. Pt having difficulty with anterior lean due to posterior bias. Pt with bil knee instability and having difficulty with steadying herself needing mod assist for steadying.  Ambulation/Gait Ambulation/Gait assistance: Min assist, Mod assist, +2 physical assistance Ambulation Distance (Feet): 2 Feet Assistive device: 2 person  hand held assist Gait Pattern/deviations: Decreased stride length, Step-to pattern, Ataxic, Leaning posteriorly General Gait Details: Pt marched in place and was weight shifting but once pt tried to take a step forward, unable due to unsteady on feet even with +2 person assist.  Gait velocity interpretation: Below normal speed for age/gender    ADL:    Cognition: Cognition Overall Cognitive Status: Impaired/Different from baseline Orientation Level: Oriented X4 Cognition Arousal/Alertness: Awake/alert Behavior During Therapy: Anxious Overall Cognitive Status: Impaired/Different from baseline Area of Impairment: Safety/judgement, Awareness, Problem solving, Orientation Orientation Level: Place, Time, Situation Safety/Judgement: Decreased awareness of safety, Decreased awareness of deficits Awareness: Intellectual Problem Solving: Difficulty sequencing, Requires verbal cues, Requires tactile cues General Comments: Pt intermittently hallucinating.  Blood pressure 131/77, pulse 83, temperature 98.3 F (36.8 C), temperature source Oral, resp. rate 16, height 5' 5.5" (1.664 m), weight 72.5 kg (159 lb 13.3 oz), SpO2 96 %. Physical Exam  Vitals reviewed. Constitutional: She is oriented to person, place, and time. No distress.  Frail 82 year old Caucasian female  HENT:  Head: Normocephalic.  Mouth/Throat: Oropharynx is clear and moist.  Eyes: Conjunctivae and EOM are normal.  Neck: Normal range of motion. Neck supple. No thyromegaly present.  Cardiovascular:  Irregular irregular  Respiratory: She has wheezes.  Decreased breath sounds at the bases with limited inspiratory effort +[place]  GI: Soft. Bowel sounds are normal. She exhibits no distension.  Neurological: She is alert and oriented to person, place, and time. She has normal reflexes.  Sensation intact to light touch Slightly  confused at times Motor: B/l UE 4/5 proximal to distal B/l LE 4+/5 proximal to distal  Skin: Skin  is warm and dry.  Ecchymosis dorsal right hand  Psychiatric: Her behavior is normal. Judgment normal.  Talkative     Lab Results Last 24 Hours    Results for orders placed or performed during the hospital encounter of 07/15/15 (from the past 24 hour(s))  CBC with Differential/Platelet Status: Abnormal   Collection Time: 07/18/15 2:30 AM  Result Value Ref Range   WBC 13.4 (H) 4.0 - 10.5 K/uL   RBC 3.77 (L) 3.87 - 5.11 MIL/uL   Hemoglobin 12.7 12.0 - 15.0 g/dL   HCT 39.0 36.0 - 46.0 %   MCV 103.4 (H) 78.0 - 100.0 fL   MCH 33.7 26.0 - 34.0 pg   MCHC 32.6 30.0 - 36.0 g/dL   RDW 14.5 11.5 - 15.5 %   Platelets 205 150 - 400 K/uL   Neutrophils Relative % 94 %   Neutro Abs 12.6 (H) 1.7 - 7.7 K/uL   Lymphocytes Relative 4 %   Lymphs Abs 0.5 (L) 0.7 - 4.0 K/uL   Monocytes Relative 2 %   Monocytes Absolute 0.3 0.1 - 1.0 K/uL   Eosinophils Relative 0 %   Eosinophils Absolute 0.0 0.0 - 0.7 K/uL   Basophils Relative 0 %   Basophils Absolute 0.0 0.0 - 0.1 K/uL  Basic metabolic panel Status: Abnormal   Collection Time: 07/18/15 2:30 AM  Result Value Ref Range   Sodium 140 135 - 145 mmol/L   Potassium 3.7 3.5 - 5.1 mmol/L   Chloride 107 101 - 111 mmol/L   CO2 26 22 - 32 mmol/L   Glucose, Bld 133 (H) 65 - 99 mg/dL   BUN 17 6 - 20 mg/dL   Creatinine, Ser 0.73 0.44 - 1.00 mg/dL   Calcium 8.1 (L) 8.9 - 10.3 mg/dL   GFR calc non Af Amer >60 >60 mL/min   GFR calc Af Amer >60 >60 mL/min   Anion gap 7 5 - 15  Magnesium Status: None   Collection Time: 07/18/15 2:30 AM  Result Value Ref Range   Magnesium 2.1 1.7 - 2.4 mg/dL      Imaging Results (Last 48 hours)    Dg Chest Port 1 View  07/17/2015 CLINICAL DATA: Shortness of breath EXAM: PORTABLE CHEST 1 VIEW COMPARISON: 07/16/2015 FINDINGS: Cardiomegaly. Small bilateral pleural  effusions with bibasilar atelectasis. No overt edema. No acute bony abnormality. Degenerative changes in the shoulders and thoracic spine. Rightward scoliosis of the thoracic spine. IMPRESSION: Cardiomegaly. Small bilateral pleural effusions with bibasilar atelectasis. Electronically Signed By: Rolm Baptise M.D. On: 07/17/2015 10:18   Portable Chest 1 View  07/16/2015 CLINICAL DATA: Pneumonia, history asthma, paroxysmal atrial fibrillation, stroke, GERD EXAM: PORTABLE CHEST 1 VIEW COMPARISON: Portable exam 0541 hours compared to 07/15/2015 FINDINGS: RIGHT costophrenic angle excluded. Enlargement of cardiac silhouette. Atherosclerotic calcification aorta. Minimal pulmonary vascular congestion. Chronic central peribronchial thickening unchanged. Question atelectasis versus infiltrate in LEFT lower lobe. Remaining lungs clear. No gross pleural effusion or pneumothorax. Bones demineralized with chronic RIGHT rotator cuff tear. IMPRESSION: Enlargement of cardiac silhouette with pulmonary vascular congestion. Bronchitic changes with questionable infiltrate versus atelectasis in LEFT lower lobe. Electronically Signed By: Lavonia Dana M.D. On: 07/16/2015 09:40     Assessment/Plan: Diagnosis: Debilitation secondary to sepsis with acute respiratory failure Labs and images independently reviewed. Records reviewed and summated above.  1. Does the need for close, 24 hr/day medical supervision in concert  with the patient's rehab needs make it unreasonable for this patient to be served in a less intensive setting? Potentially 2. Co-Morbidities requiring supervision/potential complications: atrial fibrillation (cont meds, monitor with increased activity), aortic stenosis (Monitor in accordance with increased physical activity and avoid UE resistance excercises), CVA 2, tachypnea (monitor RR and O2 Sats with increased physical exertion), leukocytosis (trending down, cont to monitor for signs and symptoms of  further infection), anxiety (ensure anxiety and resulting apprehension do not limit functional progress; cont prn medications if warranted) 3. Due to safety, skin/wound care, disease management, medication administration and patient education, does the patient require 24 hr/day rehab nursing? Yes 4. Does the patient require coordinated care of a physician, rehab nurse, PT (1-2 hrs/day, 5 days/week) and OT (1-2 hrs/day, 5 days/week) to address physical and functional deficits in the context of the above medical diagnosis(es)? Yes Addressing deficits in the following areas: endurance, locomotion, strength, transferring, dressing, grooming, toileting and psychosocial support 5. Can the patient actively participate in an intensive therapy program of at least 3 hrs of therapy per day at least 5 days per week? Potentially 6. The potential for patient to make measurable gains while on inpatient rehab is excellent 7. Anticipated functional outcomes upon discharge from inpatient rehab are modified independent and supervision with PT, modified independent and supervision with OT, n/a with SLP. 8. Estimated rehab length of stay to reach the above functional goals is: 13-17 days. 9. Does the patient have adequate social supports and living environment to accommodate these discharge functional goals? Yes 10. Anticipated D/C setting: Home 11. Anticipated post D/C treatments: HH therapy and Home excercise program 12. Overall Rehab/Functional Prognosis: excellent  RECOMMENDATIONS: This patient's condition is appropriate for continued rehabilitative care in the following setting: CIR Patient has agreed to participate in recommended program. Yes Note that insurance prior authorization may be required for reimbursement for recommended care.  Comment: Rehab Admissions Coordinator to follow up  Delice Lesch, MD 07/18/2015       Revision History     Date/Time User Provider Type Action   07/18/2015 10:54 AM  Ankit Lorie Phenix, MD Physician Sign   07/18/2015 6:29 AM Cathlyn Parsons, PA-C Physician Assistant Pend   View Details Report       Routing History     Date/Time From To Method   07/18/2015 10:54 AM Ankit Lorie Phenix, MD Ankit Lorie Phenix, MD In [facility]   07/18/2015 10:54 AM Ankit Lorie Phenix, MD Lona Kettle, MD Fax

## 2015-07-20 NOTE — Care Management Important Message (Signed)
Important Message  Patient Details  Name: DELMI BEN MRN: [id number] Date of Birth: 1923/12/19   Medicare Important Message Given:  Yes    Bethena Roys, RN 07/20/2015, 11:43 AM

## 2015-07-20 NOTE — Discharge Summary (Signed)
Physician Discharge Summary  Caroline Myers [id number] DOB: March 14, 1924 DOA: 07/15/2015  PCP:  Melinda Crutch, MD  Admit date: 07/15/2015 Discharge date: 07/20/2015  Time spent: 30 minutes  Recommendations for Outpatient Follow-up:  Please follow up with inpatient rehab with PT PLEASE obtain a CXR in 3 days to make sure the pneumonia and the effusions are improving.  Please get a bmp in 2 days to check potassium.  Please wean her off the oxygen in the next one week.  Please follow up with PCP in 1 to 2 weeks .    Discharge Diagnoses:  Principal Problem:   Severe sepsis ([facility]) Active Problems:   Anxiety state   Atrial fibrillation (HCC)   Bronchitis   Aortic valve disorder   SAH (subarachnoid hemorrhage) (HCC)   SDH (subdural hematoma) (HCC)   Benign essential HTN   CVA (cerebral infarction)   Stroke (HCC)   Chronic atrial fibrillation (HCC)   Chronic anticoagulation   Acute respiratory failure with hypercapnia (HCC)   Hypotension   Elevated troponin   PNA (pneumonia)   Leukocytosis   Arterial hypotension   History of CVA (cerebrovascular accident)   Tachypnea   Discharge Condition: improved  Diet recommendation: low sodium diet.   Filed Weights   07/18/15 0322 07/19/15 0634 07/20/15 0500  Weight: 72.5 kg (159 lb 13.3 oz) 72.439 kg (159 lb 11.2 oz) 72.122 kg (159 lb)    History of present illness:  Caroline Myers is a 82 y.o. female, with atrial fibrillation on Eliquis, anxiety, aortic stenosis, and a history of 2 CVAs. She presents to the emergency department via EMS with severe sepsis, hypotension, tachypnea, tachycardia and fever. Per her husband she is normally ambulatory at home. She cannot walk far, but she does perform all of her own ADLs and does not use a cane or walker. Her family mentions that over the past week she has developed a cough with yellow phlegm. When her coughing is severe she has had chest pain. They have noticed her becoming more short of  breath walking even short distances. She saw her primary care physician on 1/13. He gave her a nebulizer and prescribed Xanax for severe anxiety. Overnight she decompensated and developed respiratory distress. When EMS evaluated her her systolic blood pressure was in the 60s, and her respiratory rate was in the 40s.  In the emergency department she has had a low-grade fever, 100.1, her lactic acid is 3.03 after 2 L of fluid, blood gas shows a PCO2 of 55.9, and a pH of 7.310, her blood pressure appears to be stabilizing with fluids - her systolic is now in the 0000000. Chest x-ray is clear, EKG shows atrial fibrillation. Flu swab is negative.  Patient was admitted to the step down unit pancultured placed empirically on IV vancomycin IV Zosyn as well as on the BiPAP. BiPAP was subsequently discontinued and Pulmicort and Brovana added to patient's nebulizers. Patient was also placed on IV steroids with improvement in the respiratory function. Patient was also noted to have elevated troponins which was felt to be secondary to a demand ischemia. Cardiology was consulted and and felt no further ischemic workup needed.  Hospital Course:   #1 severe sepsis Likely secondary to community-acquired pneumonia. Patient presented with sepsis criteria with tachycardia, tachypnea, low-grade fever, lactic acidosis, hypotension with systolic blood pressure in the 60s with a respiratory acidosis. Initial ABG and placed on the BiPAP. Patient has been pancultured cultures have been negative so far.    #2  acute respiratory failure with hypoxia Patient had presented with acute respiratory failure with hypoxia and had upper respiratory symptoms prior to admission. Patient also with complaints of worsening chronic shortness of breath over the past several months. Patient with a history of mild aortic stenosis. Patient currently off BiPAP and on nasal cannula, with clinical improvement. Patient did not get her IV Solu-Medrol the  night of 07/16/2015. Repeat chest x-ray 07/16/2015 with questionable atelectasis versus infiltrate in the left lower lobe. Saline lock IV fluids. 2-D echo with EF of 50-55% with incoordinated in septal motion, mild aortic stenosis, moderate pulmonary hypertension. Influenza PCR is negative. One exam her breathing has improved, no wheezing heard. Plan for tapering the steroids soon on discharge and transition to oral antibiotics to complete the course.. Repeat CXR showed bibasilar infiltrates, probably from small effusions from fluids given on admission for hypotension. She was started on lasix to be given for the next 3 days and repeat a cxr to make sure her pneumonia is improving in the next three days.    #3 probable community-acquired pneumonia Patient endorses upper respiratory symptoms for a week prior to admission. Patient had presented with acute respiratory failure with hypoxia and hypotension. Sputum culturs have not been picked up. Mucinex to 1200 mg twice daily. Completed 6 days of IV antibiotic, plan to change to oral to complete the course.   #4 hypotension Likely secondary to hypovolemia versus secondary to sepsis versus cardiac etiology. Blood pressure has responded to IV fluids. Patient has been pancultured and results are negative so far. Repeat chest x-ray with questionable left lower lobe atelectasis versus infiltrate. Cardiac enzymes elevated. 2-D echo with EF of 50-55% with incoordinated in septal motion, mild aortic stenosis, moderate pulmonary hypertension. No further work up.   #5 elevated troponins Per cardiology likely a demand ischemia. Patient had presented with a history of worsening shortness of breath over the past several months. Patient also noted to be hypotensive on admission. Patient stated had a chest heaviness this morning which has since improved. Patient with history of mild aortic stenosis. Cardiac enzymes seem to have plateaued. 2-D echo with EF of 50-55% with a  coordinated septal motion, mild aortic stenosis, moderate pulmonary hypertension. Continue home regimen of Cardizem. No further ischemic workup needed per cardiology.   #6 mild aortic stenosis Patient states she's been having progressive worsening shortness of breath over the past several months. Repeat 2-D echo with EF of 50-55% with incoordinate in septal motion, mild aortic stenosis, moderate pulmonary hypertension. Per cardiology no further workup needed.   #7 lactic acidosis Likely secondary to problem #1 versus dehydration. Trending down. Patient has been pancultured. And negative so far.   #8 anxiety Continue Zoloft. Xanax was discontinued as patient was noted to be hallucinating. Hallucinations resolved.   #9 history of atrial fibrillation Heart rate in the 80s. Cardiac enzymes elevated however plateaued. 2-D echo with EF of 50-55% with incoordinate in septal motion. Mild aortic stenosis. Moderate pulmonary hypertension. Continue Cardizem for rate control. Continue eliquis.  #10 history of CVA Continue eliquis for secondary stroke prevention.  #11 pulmonary hypertension;  Outpatient follow up.   #12 leukocytosis Maybe secondary to pneumonia and IV steroids. Patient has been pancultured results negative so far. Discontinued IV vancomycin. And she completed 6 days of IV zosyn. She will discharged on oral augmentin to complete the course.   #13 prophylaxis On eliquis for DVT prophylaxis.  #14 loose watery bowel movements: c diff pcr is negative.  Procedures:  Echocardiogram.   Consultations:  Inpatient rehabilitation.   Cardiology.   Discharge Exam: Filed Vitals:   07/19/15 2125 07/20/15 0500  BP: 149/70 150/78  Pulse: 77 88  Temp: 98.4 F (36.9 C) 98.7 F (37.1 C)  Resp: 19 16    General: alert afebrile comfortable.  Cardiovascular: s1s2 Respiratory: diminished at bases, no wheezing or rhonchi  Discharge Instructions   Discharge Instructions    Diet -  low sodium heart healthy    Complete by:  As directed      Discharge instructions    Complete by:  As directed   Please obtain a CXR in 3 days to follow up improving pneumonia.  Please follow up with PCP in 1 to 2 weeks.          Current Discharge Medication List    START taking these medications   Details  amoxicillin-clavulanate (AUGMENTIN) 875-125 MG tablet Take 1 tablet by mouth 2 (two) times daily. Qty: 6 tablet, Refills: 0    budesonide-formoterol (SYMBICORT) 80-4.5 MCG/ACT inhaler Inhale 2 puffs into the lungs 2 (two) times daily. Qty: 1 Inhaler, Refills: 12    furosemide (LASIX) 20 MG tablet Take 1 tablet (20 mg total) by mouth daily. Qty: 3 tablet, Refills: 0    guaiFENesin-dextromethorphan (ROBITUSSIN DM) 100-10 MG/5ML syrup Take 5 mLs by mouth every 4 (four) hours as needed for cough. Qty: 118 mL, Refills: 0    loratadine (CLARITIN) 10 MG tablet Take 1 tablet (10 mg total) by mouth daily.    predniSONE (DELTASONE) 20 MG tablet Prednisone 40 mg daily for 3 days followed by  Prednisone 20 mg daily for 3 days and stop.    senna-docusate (SENOKOT-S) 8.6-50 MG tablet Take 1 tablet by mouth at bedtime as needed for mild constipation or moderate constipation.      CONTINUE these medications which have NOT CHANGED   Details  ALPRAZolam (XANAX) 0.25 MG tablet Take 0.25 mg by mouth 2 (two) times daily as needed for anxiety.     apixaban (ELIQUIS) 5 MG TABS tablet Take 1 tablet (5 mg total) by mouth 2 (two) times daily. Qty: 180 tablet, Refills: 3    B Complex-C (B-COMPLEX WITH VITAMIN C) tablet Take 1 tablet by mouth daily.    diltiazem (CARDIZEM CD) 180 MG 24 hr capsule Take 180 mg by mouth daily.    PROAIR HFA 108 (90 Base) MCG/ACT inhaler Inhale 1-2 puffs into the lungs every 4 (four) hours as needed for wheezing or shortness of breath.  Refills: 0    sertraline (ZOLOFT) 50 MG tablet Take 50 mg by mouth daily. Refills: 4       Allergies  Allergen Reactions  .  Tape Other (See Comments)    Rips skin   Follow-up Information    Follow up with  Melinda Crutch, MD. Schedule an appointment as soon as possible for a visit in 2 weeks.   Specialty:  Family Medicine   Contact information:   [place] [address] [phone]        The results of significant diagnostics from this hospitalization (including imaging, microbiology, ancillary and laboratory) are listed below for reference.    Significant Diagnostic Studies: Dg Chest 2 View  07/19/2015  CLINICAL DATA:  Pneumonia, shortness of breath, sepsis and weakness. EXAM: CHEST - 2 VIEW COMPARISON:  07/16/2005 FINDINGS: Increased consolidative changes at the lung bases noted, left greater than right. Associated small bilateral pleural effusions are present and findings are consistent  with bilateral pneumonia. No pulmonary edema or pneumothorax identified. There is stable cardiac enlargement. The bony thorax is unremarkable. IMPRESSION: Worsening bilateral lower lobe pneumonia, left greater than right, with associated small bilateral pleural effusions. Electronically Signed   By: Aletta Edouard M.D.   On: 07/19/2015 17:02   Dg Chest Port 1 View  07/17/2015  CLINICAL DATA:  Shortness of breath EXAM: PORTABLE CHEST 1 VIEW COMPARISON:  07/16/2015 FINDINGS: Cardiomegaly. Small bilateral pleural effusions with bibasilar atelectasis. No overt edema. No acute bony abnormality. Degenerative changes in the shoulders and thoracic spine. Rightward scoliosis of the thoracic spine. IMPRESSION: Cardiomegaly. Small bilateral pleural effusions with bibasilar atelectasis. Electronically Signed   By: Rolm Baptise M.D.   On: 07/17/2015 10:18   Portable Chest 1 View  07/16/2015  CLINICAL DATA:  Pneumonia, history asthma, paroxysmal atrial fibrillation, stroke, GERD EXAM: PORTABLE CHEST 1 VIEW COMPARISON:  Portable exam 0541 hours compared to 07/15/2015 FINDINGS: RIGHT costophrenic angle excluded. Enlargement of  cardiac silhouette. Atherosclerotic calcification aorta. Minimal pulmonary vascular congestion. Chronic central peribronchial thickening unchanged. Question atelectasis versus infiltrate in LEFT lower lobe. Remaining lungs clear. No gross pleural effusion or pneumothorax. Bones demineralized with chronic RIGHT rotator cuff tear. IMPRESSION: Enlargement of cardiac silhouette with pulmonary vascular congestion. Bronchitic changes with questionable infiltrate versus atelectasis in LEFT lower lobe. Electronically Signed   By: Lavonia Dana M.D.   On: 07/16/2015 09:40   Dg Chest Portable 1 View  07/15/2015  CLINICAL DATA:  82 year old female with shortness of breath EXAM: PORTABLE CHEST 1 VIEW COMPARISON:  Chest radiograph dated 02/14/2015 FINDINGS: Single-view of the chest demonstrates stable cardiomegaly. No focal consolidation, pleural effusion, or pneumothorax. Osteopenia with degenerative changes of the spine. No acute fracture. IMPRESSION: No active disease. Electronically Signed   By: Anner Crete M.D.   On: 07/15/2015 06:06    Microbiology: Recent Results (from the past 240 hour(s))  Blood culture (routine x 2)     Status: None   Collection Time: 07/15/15  6:00 AM  Result Value Ref Range Status   Specimen Description BLOOD RIGHT ANTECUBITAL  Final   Special Requests BOTTLES DRAWN AEROBIC AND ANAEROBIC 10CC  Final   Culture NO GROWTH 5 DAYS  Final   Report Status 07/20/2015 FINAL  Final  Blood culture (routine x 2)     Status: None   Collection Time: 07/15/15  6:15 AM  Result Value Ref Range Status   Specimen Description BLOOD RIGHT HAND  Final   Special Requests BOTTLES DRAWN AEROBIC ONLY 5CC  Final   Culture NO GROWTH 5 DAYS  Final   Report Status 07/20/2015 FINAL  Final  MRSA PCR Screening     Status: None   Collection Time: 07/15/15  9:09 PM  Result Value Ref Range Status   MRSA by PCR NEGATIVE NEGATIVE Final    Comment:        The GeneXpert MRSA Assay (FDA approved for NASAL  specimens only), is one component of a comprehensive MRSA colonization surveillance program. It is not intended to diagnose MRSA infection nor to guide or monitor treatment for MRSA infections.   Culture, Urine     Status: None   Collection Time: 07/16/15  7:04 AM  Result Value Ref Range Status   Specimen Description URINE, RANDOM  Final   Special Requests NONE  Final   Culture NO GROWTH 1 DAY  Final   Report Status 07/17/2015 FINAL  Final  C difficile quick scan w PCR reflex  Status: None   Collection Time: 07/19/15  2:34 PM  Result Value Ref Range Status   C Diff antigen NEGATIVE NEGATIVE Final   C Diff toxin NEGATIVE NEGATIVE Final   C Diff interpretation Negative for toxigenic C. difficile  Final     Labs: Basic Metabolic Panel:  Recent Labs Lab 07/15/15 0628 07/16/15 0110 07/17/15 0232 07/18/15 0230 07/19/15 0445  NA 134* 137 138 140 140  K 4.0 3.8 3.7 3.7 3.6  CL 99* 103 106 107 108  CO2 26 25 24 26 26   GLUCOSE 146* 145* 135* 133* 144*  BUN 14 16 21* 17 29*  CREATININE 0.79 0.84 0.87 0.73 0.98  CALCIUM 8.3* 8.2* 8.2* 8.1* 8.2*  MG  --   --   --  2.1  --    Liver Function Tests:  Recent Labs Lab 07/16/15 0110  AST 53*  ALT 23  ALKPHOS 76  BILITOT 0.7  PROT 5.9*  ALBUMIN 2.8*   No results for input(s): LIPASE, AMYLASE in the last 168 hours. No results for input(s): AMMONIA in the last 168 hours. CBC:  Recent Labs Lab 07/15/15 0628 07/17/15 0232 07/18/15 0230 07/19/15 0445 07/20/15 1017  WBC 9.8 19.7* 13.4* 16.0* 16.9*  NEUTROABS 8.7*  --  12.6*  --   --   HGB 13.2 13.4 12.7 13.1 14.2  HCT 40.0 41.2 39.0 40.3 45.0  MCV 102.3* 101.7* 103.4* 102.5* 102.5*  PLT 209 222 205 226 269   Cardiac Enzymes:  Recent Labs Lab 07/15/15 1942 07/16/15 0110 07/16/15 0820 07/16/15 1355 07/16/15 1857  TROPONINI 0.13* 0.11* 0.12* 0.12* 0.11*   BNP: BNP (last 3 results)  Recent Labs  07/15/15 0628  BNP 123.7*    ProBNP (last 3  results) No results for input(s): PROBNP in the last 8760 hours.  CBG: No results for input(s): GLUCAP in the last 168 hours.     SignedHosie Poisson MD.  [facility] 07/20/2015, 2:20 PM

## 2015-07-20 NOTE — Progress Notes (Signed)
Inpatient Rehabilitation  I spoke with the patient at the bedside then pt's daughter by phone.  Family has decided they want pt. to come to IP Rehab and pt. is agreeable.  I received medical clearance from Dr. Karleen Hampshire and have updated pt's RN Cyril Mourning and  Jacqlyn Krauss RNCM .  Hassan Rowan stated she has updated Liz Beach, CSW.  I will make all necessary arrangements for admission later today.  Please call if questions.  [facility] Admissions Coordinator Cell [phone] Office [phone]

## 2015-07-20 NOTE — Care Management Note (Signed)
Case Management Note  Patient Details  Name: POLLYANNA REHMERT MRN: [id number] Date of Birth: 1924-01-11  Subjective/Objective:  Late Entry:    Pt admitted for Sepsis. Plan for inpatient Rehab today.                Action/Plan: No needs identified from CM at this time.   Expected Discharge Date:  07/19/15               Expected Discharge Plan:  [place]  In-House Referral:  NA  Discharge planning Services  CM Consult  Post Acute Care Choice:  NA Choice offered to:  NA  DME Arranged:  N/A DME Agency:  NA  HH Arranged:  NA HH Agency:  NA  Status of Service:  Completed, signed off  Medicare Important Message Given:  Yes Date Medicare IM Given:    Medicare IM give by:    Date Additional Medicare IM Given:    Additional Medicare Important Message give by:     If discussed at [place] of Stay Meetings, dates discussed:    Additional Comments:  Bethena Roys, RN 07/20/2015, 4:20 PM

## 2015-07-21 ENCOUNTER — Inpatient Hospital Stay (HOSPITAL_COMMUNITY): Payer: Medicare Other | Admitting: Occupational Therapy

## 2015-07-21 ENCOUNTER — Inpatient Hospital Stay (HOSPITAL_COMMUNITY): Payer: Medicare Other

## 2015-07-21 DIAGNOSIS — E876 Hypokalemia: Secondary | ICD-10-CM | POA: Insufficient documentation

## 2015-07-21 DIAGNOSIS — N179 Acute kidney failure, unspecified: Secondary | ICD-10-CM

## 2015-07-21 DIAGNOSIS — I5031 Acute diastolic (congestive) heart failure: Secondary | ICD-10-CM

## 2015-07-21 LAB — COMPREHENSIVE METABOLIC PANEL
ALT: 44 U/L (ref 14–54)
AST: 30 U/L (ref 15–41)
Albumin: 2.4 g/dL — ABNORMAL LOW (ref 3.5–5.0)
Alkaline Phosphatase: 67 U/L (ref 38–126)
Anion gap: 7 (ref 5–15)
BUN: 26 mg/dL — ABNORMAL HIGH (ref 6–20)
CHLORIDE: 100 mmol/L — AB (ref 101–111)
CO2: 33 mmol/L — AB (ref 22–32)
Calcium: 7.9 mg/dL — ABNORMAL LOW (ref 8.9–10.3)
Creatinine, Ser: 1.08 mg/dL — ABNORMAL HIGH (ref 0.44–1.00)
GFR calc Af Amer: 51 mL/min — ABNORMAL LOW (ref >60)
GFR, EST NON AFRICAN AMERICAN: 44 mL/min — AB (ref >60)
Glucose, Bld: 109 mg/dL — ABNORMAL HIGH (ref 65–99)
Potassium: 3.1 mmol/L — ABNORMAL LOW (ref 3.5–5.1)
SODIUM: 140 mmol/L (ref 135–145)
TOTAL PROTEIN: 5.2 g/dL — AB (ref 6.5–8.1)
Total Bilirubin: 0.9 mg/dL (ref 0.3–1.2)

## 2015-07-21 LAB — CBC WITH DIFFERENTIAL/PLATELET
Basophils Absolute: 0 10*3/uL (ref 0.0–0.1)
Basophils Relative: 0 %
EOS PCT: 0 %
Eosinophils Absolute: 0 10*3/uL (ref 0.0–0.7)
HCT: 42 % (ref 36.0–46.0)
Hemoglobin: 14.5 g/dL (ref 12.0–15.0)
LYMPHS PCT: 11 %
Lymphs Abs: 1.7 10*3/uL (ref 0.7–4.0)
MCH: 34 pg (ref 26.0–34.0)
MCHC: 34.5 g/dL (ref 30.0–36.0)
MCV: 98.6 fL (ref 78.0–100.0)
MONO ABS: 1 10*3/uL (ref 0.1–1.0)
MONOS PCT: 6 %
NEUTROS PCT: 83 %
Neutro Abs: 13.2 10*3/uL — ABNORMAL HIGH (ref 1.7–7.7)
PLATELETS: 266 10*3/uL (ref 150–400)
RBC: 4.26 MIL/uL (ref 3.87–5.11)
RDW: 14.1 % (ref 11.5–15.5)
WBC: 15.9 10*3/uL — AB (ref 4.0–10.5)

## 2015-07-21 MED ORDER — PREDNISONE 20 MG PO TABS
20.0000 mg | ORAL_TABLET | Freq: Every day | ORAL | Status: AC
  Administered 2015-07-24 – 2015-07-26 (×3): 20 mg via ORAL
  Filled 2015-07-21 (×3): qty 1

## 2015-07-21 MED ORDER — POTASSIUM CHLORIDE 20 MEQ PO PACK
40.0000 meq | PACK | Freq: Two times a day (BID) | ORAL | Status: DC
  Administered 2015-07-21 (×2): 40 meq via ORAL
  Filled 2015-07-21 (×5): qty 2

## 2015-07-21 MED ORDER — AMOXICILLIN-POT CLAVULANATE 875-125 MG PO TABS
1.0000 | ORAL_TABLET | Freq: Two times a day (BID) | ORAL | Status: DC
  Administered 2015-07-21 – 2015-07-29 (×17): 1 via ORAL
  Filled 2015-07-21 (×17): qty 1

## 2015-07-21 MED ORDER — PREDNISONE 20 MG PO TABS
40.0000 mg | ORAL_TABLET | Freq: Every day | ORAL | Status: AC
Start: 2015-07-21 — End: 2015-07-23
  Administered 2015-07-21 – 2015-07-23 (×3): 40 mg via ORAL
  Filled 2015-07-21 (×3): qty 2

## 2015-07-21 NOTE — Progress Notes (Signed)
Intravenous Zosyn has been transitioned to Augmentin twice a day 7 days and prednisone taper initiated.

## 2015-07-21 NOTE — Care Management Note (Signed)
Inpatient [facility] Individual Statement of Services  Patient Name:  Caroline Myers  Date:  07/21/2015  Welcome to the [facility].  Our goal is to provide you with an individualized program based on your diagnosis and situation, designed to meet your specific needs.  With this comprehensive rehabilitation program, you will be expected to participate in at least 3 hours of rehabilitation therapies Monday-Friday, with modified therapy programming on the weekends.  Your rehabilitation program will include the following services:  Physical Therapy (PT), Occupational Therapy (OT), 24 hour per day rehabilitation nursing, Case Management (Social Worker), Rehabilitation Medicine, Nutrition Services and Pharmacy Services  Weekly team conferences will be held on Wednesday to discuss your progress.  Your Social Worker will talk with you frequently to get your input and to update you on team discussions.  Team conferences with you and your family in attendance may also be held.  Expected length of stay: 8-10 days Overall anticipated outcome: supervision with some cueing  Depending on your progress and recovery, your program may change. Your Social Worker will coordinate services and will keep you informed of any changes. Your Social Worker's name and contact numbers are listed  below.  The following services may also be recommended but are not provided by the [facility]:    [place] will be made to provide these services after discharge if needed.  Arrangements include referral to agencies that provide these services.  Your insurance has been verified to be:  [place] Your primary doctor is:  Lona Kettle  Pertinent information will be shared with your doctor and your insurance company.  Social Worker:  Ovidio Kin, [place] or ([phone]  Information discussed with and copy given to patient by: [name], 07/21/2015, 11:22 AM

## 2015-07-21 NOTE — Progress Notes (Signed)
[facility] PHYSICAL MEDICINE & REHABILITATION     PROGRESS NOTE  Subjective/Complaints: Pt sitting up this AM.  She did not sleep well last night due to hospital noise, but is ready to begin therapies.    ROS: Denies CP, SOB, n/v/d.   Objective: Vital Signs: Blood pressure 144/84, pulse 70, temperature 98.2 F (36.8 C), temperature source Oral, resp. rate 17, weight 68.4 kg (150 lb 12.7 oz), SpO2 96 %. Dg Chest 2 View  07/19/2015  CLINICAL DATA:  Pneumonia, shortness of breath, sepsis and weakness. EXAM: CHEST - 2 VIEW COMPARISON:  07/16/2005 FINDINGS: Increased consolidative changes at the lung bases noted, left greater than right. Associated small bilateral pleural effusions are present and findings are consistent with bilateral pneumonia. No pulmonary edema or pneumothorax identified. There is stable cardiac enlargement. The bony thorax is unremarkable. IMPRESSION: Worsening bilateral lower lobe pneumonia, left greater than right, with associated small bilateral pleural effusions. Electronically Signed   By: Aletta Edouard M.D.   On: 07/19/2015 17:02    Recent Labs  07/20/15 1017 07/21/15 0616  WBC 16.9* 15.9*  HGB 14.2 14.5  HCT 45.0 42.0  PLT 269 266    Recent Labs  07/19/15 0445 07/21/15 0616  NA 140 140  K 3.6 3.1*  CL 108 100*  GLUCOSE 144* 109*  BUN 29* 26*  CREATININE 0.98 1.08*  CALCIUM 8.2* 7.9*   CBG (last 3)  No results for input(s): GLUCAP in the last 72 hours.  Wt Readings from Last 3 Encounters:  07/21/15 68.4 kg (150 lb 12.7 oz)  07/20/15 72.122 kg (159 lb)  05/30/15 64.864 kg (143 lb)    Physical Exam:  BP 144/84 mmHg  Pulse 70  Temp(Src) 98.2 F (36.8 C) (Oral)  Resp 17  Wt 68.4 kg (150 lb 12.7 oz)  SpO2 96% Constitutional:  No distress. Frail 82 year old Caucasian female  HENT: Negative JVD. Head: Normocephalic.  Eyes: Conjunctivae and EOM are normal.  Cardiovascular: Irregular irregular  Respiratory: She has wheezes. +[place]  GI:  Soft. Bowel sounds are normal. She exhibits no distension.  MSK: 1+ right pretibial edema. No tenderness Neurological: She is alert and oriented.   Motor: B/l UE 4/5 proximal to distal B/l LE 4+/5 proximal to distal  Skin: Skin is warm and dry.  Ecchymosis dorsal right hand, Ecchymosis right anterior and medial leg Psychiatric: Her behavior is normal. Judgment normal.   Assessment/Plan: 1. Functional deficits secondary to sepsis/pneumonia which require 3+ hours per day of interdisciplinary therapy in a comprehensive inpatient rehab setting. Physiatrist is providing close team supervision and 24 hour management of active medical problems listed below. Physiatrist and rehab team continue to assess barriers to discharge/monitor patient progress toward functional and medical goals.  Function:  Bathing Bathing position      Bathing parts      Bathing assist        Upper Body Dressing/Undressing Upper body dressing                    Upper body assist        Lower Body Dressing/Undressing Lower body dressing                                  Lower body assist        Toileting Toileting          Toileting assist     Transfers Chair/bed transfer  Psychologist, sport and exercise Comprehension    Expression    Social Interaction    Problem Solving    Memory      Medical Problem List and Plan: 1. Debilitation/acute respiratory failure secondary to sepsis/pneumonia. Continue nebulizer as directed.   Plan taper of prednisone.   Zosyn changed to Augmentin BID  Follow-up chest x-ray 07/22/2015 2. DVT Prophylaxis/Anticoagulation: Eliquis 3. Pain Management: Tylenol 4. Mood: Zoloft 50 mg daily. Provide emotional support 5. Neuropsych: This patient is capable of making decisions on her own behalf. 6. Skin/Wound Care: Routine skin checks 7. Fluids/Electrolytes/Nutrition: Routine  I&O's  Hypokalemia; 3.1 on 1/20.  Will supplement 8. Atrial fibrillation. Cardiac rate control. Continue Cardizem 180 mg daily 9. Leukocytosis:   15.9 on 1/20  Cont abx,   Will cont to monitor 10. AKI  Cr. 1.08 on 1/20  Will encourage fluid intake and consider IVF if necessary  LOS (Days) 1 A FACE TO FACE EVALUATION WAS PERFORMED  Shun Pletz Lorie Phenix 07/21/2015 9:20 AM

## 2015-07-21 NOTE — Progress Notes (Signed)
Patient information reviewed and entered into eRehab system by Daine Croker, RN, CRRN, PPS Coordinator.  Information including medical coding and functional independence measure will be reviewed and updated through discharge.     Per nursing patient was given "Data Collection Information Summary for Patients in Inpatient Rehabilitation Facilities with attached "Privacy Act Statement-Health Care Records" upon admission.  

## 2015-07-21 NOTE — Progress Notes (Signed)
Occupational Therapy Session Note  Patient Details  Name: Caroline Myers MRN: [id number] Date of Birth: 07-26-1923  Today's Date: 07/21/2015 OT Individual Time:  -  1415-1445  (30 min)  1st session                                         1600-1630  (30 min)   2nd session      Short Term Goals: Week 1:  OT Short Term Goal 1 (Week 1): STG=LTG d/t short LOS Week 2:     Skilled Therapeutic Interventions/Progress Updates:    1.  Pt. Sitting in wc upon OT arrival.  Engaged in diaphramatic breathing using upright posture.  Went over and incorporated UE exercises as she practices breathing.  Need cues for postural control. 2.  Engaged in functional mobility with pt ambulating to shower stall and performing transfer to tub transfer bench.    OT provided cues fo holding grab bars and turning to avoid tripping over O2 cord.  Incorporated diaphragmatic breathing as she moved.  Pt. Transferred to toilet and voided.  Ambulated back to wc and left with all needs in reach.    .   Therapy Documentation Precautions:  Precautions Precautions: Fall Precaution Comments: watch O2 Restrictions Weight Bearing Restrictions: No      Pain:  None in both sessions   ADL: ADL ADL Comments: see Functional Assessment Tool      See Function Navigator for Current Functional Status.   Therapy/Group: Individual Therapy  Lisa Roca 07/21/2015, 2:52 PM

## 2015-07-21 NOTE — IPOC Note (Addendum)
Overall Plan of Care [facility]) Patient Details Name: Caroline Myers MRN: [id number] DOB: [birth year]  Admitting Diagnosis: DEBILITY SEPSIS ACUTE RESP FAILURE  Hospital Problems: Principal Problem:   Debility Active Problems:   PNA (pneumonia)   Acute diastolic congestive heart failure (HCC)   Acute on chronic diastolic congestive heart failure (HCC)   Sepsis (HCC)   Hypokalemia   AKI (acute kidney injury) ([place])     Functional Problem List: Nursing Bladder, Edema, Endurance, Medication Management, Motor  PT Balance, Endurance, Safety, Pain  OT Endurance, Safety, Other (Comment)  SLP    TR         Basic ADL's: OT Eating, Grooming, Bathing, Dressing, Toileting     Advanced  ADL's: OT Simple Meal Preparation, Light Housekeeping     Transfers: PT Bed Mobility, Bed to Chair, Car, Manufacturing systems engineer, Metallurgist: PT Ambulation, Stairs     Additional Impairments: OT None  SLP        TR      Anticipated Outcomes Item Anticipated Outcome  Self Feeding Mod I  Swallowing      Basic self-care  Supervision  Toileting  Mod I   Bathroom Transfers Supervision  Bowel/Bladder  Mod I  Transfers  supervision overall  Locomotion  supervision overall  Communication     Cognition     Pain  < 3  Safety/Judgment  Mod I   Therapy Plan: PT Intensity: Minimum of 1-2 x/day ,45 to 90 minutes PT Frequency: 5 out of 7 days PT Duration Estimated Length of Stay: 10 days OT Intensity: Minimum of 1-2 x/day, 45 to 90 minutes OT Frequency: 5 out of 7 days OT Duration/Estimated Length of Stay: 7-10 days         Team Interventions: Nursing Interventions Patient/Family Education, Bladder Management, Disease Management/Prevention, Medication Management  PT interventions Ambulation/gait training, Training and development officer, Community reintegration, Discharge planning, Disease management/prevention, DME/adaptive equipment instruction, Functional mobility  training, Neuromuscular re-education, Pain management, Patient/family education, Psychosocial support, Stair training, Therapeutic Activities, Wheelchair propulsion/positioning, UE/LE Coordination activities, Therapeutic Exercise, UE/LE Strength taining/ROM, Cognitive remediation/compensation  OT Interventions Therapeutic Exercise, Therapeutic Activities, UE/LE Strength taining/ROM, Functional mobility training, Patient/family education, Discharge planning, Disease mangement/prevention, Self Care/advanced ADL retraining, DME/adaptive equipment instruction  SLP Interventions    TR Interventions    SW/CM Interventions Discharge Planning, Psychosocial Support, Patient/Family Education    Team Discharge Planning: Destination: PT-Home ,OT- Home , SLP-  Projected Follow-up: PT-Home health PT, 24 hour supervision/assistance, OT-  Home health OT, SLP-  Projected Equipment Needs: PT-To be determined, OT- To be determined, SLP-  Equipment Details: PT-Pt has RW available at home per report if needed, OT-  Patient/family involved in discharge planning: PT- Patient,  OT-Patient, SLP-   MD ELOS: 7-10 days. Medical Rehab Prognosis:  Excellent Assessment: 82 y.o. right handed female with history of atrial fibrillation maintained on Eliquis, aortic stenosis, CVA 2. Pt and spouse help each other. Ambulates without assistive device but only short distances. Presented 07/15/2015 with hypotension with systolic pressures in the 60s and tachypnea, tachycardia as well as low-grade fever, productive cough. Lactic acid level 3.03. Chest x-ray negative. Blood cultures completed showing no growth. Placed on empiric Zosyn. Follow-up chest x-ray bronchitic changes with questionable infiltrate versus atelectasis left lower lobe. Patient transitioned from intravenous Solu-Medrol to by mouth prednisone and taper as directed. Troponin mildly elevated 0.13 felt to be related to demand ischemia and follow-up per cardiology services.  Placed on gentle IV fluids. Echocardiogram with  ejection fraction of 55% and normal systolic function. Tolerating a regular consistency diet.   See Team Conference Notes for weekly updates to the plan of care

## 2015-07-21 NOTE — Evaluation (Signed)
Physical Therapy Assessment and Plan  Patient Details  Name: Caroline Myers MRN: [id number] Date of Birth: Apr 30, 1924  PT Diagnosis: Abnormal posture, Difficulty walking, Dizziness and giddiness, Impaired cognition, Low back pain and Muscle weakness Rehab Potential: Good ELOS: 10 days   Today's Date: 07/21/2015 PT Individual Time: 1045-1200 PT Individual Time Calculation (min): 75 min    Problem List:  Patient Active Problem List   Diagnosis Date Noted  . Hypokalemia   . AKI (acute kidney injury) ([place])   . Acute diastolic congestive heart failure ([place]) 07/20/2015  . Debility 07/20/2015  . Sepsis ([place]) 07/20/2015  . Acute on chronic diastolic congestive heart failure ([place])   . History of CVA (cerebrovascular accident)   . Tachypnea   . Leukocytosis 07/17/2015  . Arterial hypotension   . Hypotension 07/16/2015  . Elevated troponin 07/16/2015  . PNA (pneumonia)   . Severe sepsis ([place]) 07/15/2015  . Acute respiratory failure with hypercapnia ([place]) 07/15/2015  . Chronic anticoagulation 04/18/2015  . Orthostatic hypotension 03/09/2015  . CVA (cerebral infarction) 02/14/2015  . Stroke ([place]) 02/14/2015  . Chronic atrial fibrillation ([place]) 02/14/2015  . Stroke with cerebral ischemia ([place])   . Cerebral infarction due to embolism of left middle cerebral artery ([place])   . Occipital fracture ([place])   . Benign essential HTN   . Other allergic rhinitis   . Depression   . SAH (subarachnoid hemorrhage) ([place]) 01/02/2015  . SDH (subdural hematoma) ([place]) 01/02/2015  . Aortic atherosclerosis ([place]) 08/05/2014  . Nonspecific abnormal unspecified cardiovascular function study 10/28/2013  . Aortic valve disorder 10/28/2013  . HLD (hyperlipidemia) 07/28/2013  . TIA (transient ischemic attack) 07/27/2013  . History of CHF (congestive heart failure) 07/27/2013  . CVA (cerebral vascular accident) ([place]) 07/27/2013  . Urticaria 11/06/2010  . Bronchitis 10/12/2010  . Preventative health care    . Atrial fibrillation ([place]) 04/04/2010  . Anxiety state 03/29/2010  . ATRIAL FLUTTER 03/29/2010  . RIB PAIN, LEFT SIDED 10/12/2009  . ALLERGIC RHINITIS 06/28/2009  . SHOULDER PAIN, LEFT 06/28/2009  . SCIATICA, RIGHT 06/28/2009  . HEADACHE 06/28/2009  . DYSPNEA ON EXERTION 06/28/2009  . SKIN CANCER, HX OF 06/28/2009    Past Medical History:  Past Medical History  Diagnosis Date  . Allergy     allergic rhinitis  . Cancer [facility])     history of skin cancer of the nose  . Asthma 2000    mild. tried Advair after bronchospasm after exposure to cats  . Shoulder pain, left 05/2010    with rotator cuff tear--Dr Alfonso Ramus  . Paroxysmal atrial fibrillation (HCC)     pt does not want anticoagulation  . Aortic stenosis     EF normal.  Mild aortic stenosis. Mild to moderate aortic regurgitation  . Anxiety   . Shortness of breath     occ  . GERD (gastroesophageal reflux disease)     occ  . Arthritis   . Stroke [facility])    Past Surgical History:  Past Surgical History  Procedure Laterality Date  . Hernia repair  2003  . Acromioplasty  2000    right shoulder, arthroscopic  . Lumbar laminectomy/decompression microdiscectomy  04/01/2012    Procedure: LUMBAR LAMINECTOMY/DECOMPRESSION MICRODISCECTOMY 1 LEVEL;  Surgeon: Eustace Moore, MD;  Location: [facility];  Service: Neurosurgery;  Laterality: Right;  Right Lumbar four-five extraforaminal microdiskectomy     Assessment & Plan Clinical Impression: Patient is a 82 y.o. year old female with recent admission to the hospital with  history of atrial fibrillation maintained on Eliquis, aortic stenosis, CVA 2. Patient lives with spouse one level home 1 step to entry. Pt and spouse help each other. Ambulates without assistive device but only short distances. She has had recent falls over the past few weeks. Daughter is currently assisting at home as patient's husband is limited. Presented 07/15/2015 with hypotension with systolic pressures in the 60s  and tachypnea, tachycardia as well as low-grade fever, productive cough. Lactic acid level 3.03. Chest x-ray negative. Blood cultures completed showing no growth. Placed on empiric Zosyn. Follow-up chest x-ray bronchitic changes with questionable infiltrate versus atelectasis left lower lobe. Patient transitioned from intravenous Solu-Medrol to by mouth prednisone and taper as directed. Troponin mildly elevated 0.13 felt to be related to demand ischemia and follow-up per cardiology services. Placed on gentle IV fluids. Echocardiogram with ejection fraction of 55% and normal systolic function. Tolerating a regular consistency diet. Physical therapy evaluation completed 07/17/2015 with recommendations of physical medicine rehabilitation consult..  Patient transferred to CIR on 07/20/2015 .   Patient currently requires min with mobility secondary to muscle weakness, decreased cardiorespiratoy endurance and decreased oxygen support, decreased problem solving and decreased memory and decreased sitting balance, decreased standing balance and decreased balance strategies.  Prior to hospitalization, patient was modified independent  with mobility and lived with Spouse in a House home.  Home access is 1 brick stepStairs to enter.  Patient will benefit from skilled PT intervention to maximize safe functional mobility, minimize fall risk and decrease caregiver burden for planned discharge home with 24 hour supervision.  Anticipate patient will benefit from follow up [place] at discharge.  PT - End of Session Activity Tolerance: Tolerates 30+ min activity with multiple rests Endurance Deficit: Yes Endurance Deficit Description: sats 90% on 2L with gait PT Assessment Rehab Potential (ACUTE/IP ONLY): Good Barriers to Discharge: Decreased caregiver support (dghtr to have sx on hip who would be the caregiver) PT Patient demonstrates impairments in the following area(s): Balance;Endurance;Safety;Pain PT Transfers Functional  Problem(s): Bed Mobility;Bed to Chair;Car;Furniture PT Locomotion Functional Problem(s): Ambulation;Stairs PT Plan PT Intensity: Minimum of 1-2 x/day ,45 to 90 minutes PT Frequency: 5 out of 7 days PT Duration Estimated Length of Stay: 10 days PT Treatment/Interventions: Ambulation/gait training;Balance/vestibular training;Community reintegration;Discharge planning;Disease management/prevention;DME/adaptive equipment instruction;Functional mobility training;Neuromuscular re-education;Pain management;Patient/family education;Psychosocial support;Stair training;Therapeutic Activities;Wheelchair propulsion/positioning;UE/LE Coordination activities;Therapeutic Exercise;UE/LE Strength taining/ROM;Cognitive remediation/compensation PT Transfers Anticipated Outcome(s): supervision overall PT Locomotion Anticipated Outcome(s): supervision overall PT Recommendation Follow Up Recommendations: Home health PT;24 hour supervision/assistance Patient destination: Home Equipment Recommended: To be determined Equipment Details: Pt has RW available at home per report if needed  Skilled Therapeutic Intervention Individual treatment initiated with focus on orientation to CIR and PT goals/POC, functional transfer training without AD with cues for hand placement and technique, gait training without AD to challenge balance at min assist level with HHA overall and cues for posture, stair negotiation training with verbal cues for foot placement and min assist for balance, functional mobility in ADL apartment/soft bed to simulate home environment mobility, and gait over uneven surfaces. Pt requires frequent rest breaks due to low endurance and decreased respiratory support. Mild cognitive impairments noted during evaluation.   PT Evaluation Precautions/Restrictions Precautions Precautions: Fall Precaution Comments: watch O2 Restrictions Weight Bearing Restrictions: No Vital Signs O2 Device: Nasal Cannula O2 Flow  Rate (L/min): 2 L/min 90-96% thoughout session Pain Premedicated for low back pain. Home Living/Prior Functioning Home Living Available Help at Discharge: Family;Available 24 hours/day Type of Home: House Home Access:  Stairs to enter [employer] of Steps: 1 brick step Entrance Stairs-Rails: None Home Layout: One level Bathroom Shower/Tub: Chiropodist: Standard Bathroom Accessibility: Yes Additional Comments: pt ambulates without assistive device and husband uses a cane.  Has had several falls per pt.  Lives With: Spouse Prior Function Level of Independence: Independent with gait;Independent with transfers  Able to Take Stairs?: Yes Comments: fell once in past year with skull fx when fell backwards placing dish in cabinet Vision/Perception  Perception Comments: WFL  Cognition Overall Cognitive Status: No family/caregiver present to determine baseline cognitive functioning Arousal/Alertness: Awake/alert Attention: Selective Selective Attention: Appears intact Memory: Impaired Memory Impairment:  (mild impairment noted; h/o CVA and bleed on the brain) Awareness: Appears intact Problem Solving: Impaired Problem Solving Impairment: Functional complex;Verbal complex Executive Function: Self Monitoring Self Monitoring: Impaired Self Monitoring Impairment: Functional basic Safety/Judgment: Appears intact Comments: Cognition seems mildly impaired; unclear if some of this is baseline (h/o CVA and bleed on brain from fall) Sensation Sensation Light Touch: Appears Intact Stereognosis: Appears Intact Hot/Cold: Appears Intact Proprioception: Appears Intact Coordination Gross Motor Movements are Fluid and Coordinated: Yes Fine Motor Movements are Fluid and Coordinated: Yes Motor  Motor Motor: Within Functional Limits Motor - Skilled Clinical Observations: significant debility;    Trunk/Postural Assessment  Cervical Assessment Cervical Assessment:  Within Functional Limits Thoracic Assessment Thoracic Assessment: Exceptions to [facility] (kyphotic posture) Thoracic AROM Overall Thoracic AROM: Due to premorid status (scoloios) Lumbar Assessment Lumbar Assessment: Exceptions to [facility] (posterior pelvic tilt) Lumbar AROM Overall Lumbar AROM: Unable to assess;Due to premorid status Postural Control Postural Control: Within Functional Limits  Balance Balance Balance Assessed: Yes Static Sitting Balance Static Sitting - Level of Assistance: 5: Stand by assistance Dynamic Sitting Balance Dynamic Sitting - Level of Assistance: 4: Min assist Sitting balance - Comments: needs UE support to balance in sitting.  Static Standing Balance Static Standing - Level of Assistance: 4: Min assist Dynamic Standing Balance Dynamic Standing - Level of Assistance: 4: Min assist;3: Mod assist Extremity Assessment  RUE Assessment RUE Assessment: Exceptions to [facility] RUE Strength RUE Overall Strength: Due to premorbid status (hx of RTC repair, 3/5 strength) LUE Assessment LUE Assessment: Within Functional Limits RLE Assessment RLE Assessment:  (grossly 3+/5; decreased muscular endurance) LLE Assessment LLE Assessment:  (grossly 3+/5; decreased muscular endurance)   See Function Navigator for Current Functional Status.   Refer to Care Plan for Long Term Goals  Recommendations for other services: None  Discharge Criteria: Patient will be discharged from PT if patient refuses treatment 3 consecutive times without medical reason, if treatment goals not met, if there is a change in medical status, if patient makes no progress towards goals or if patient is discharged from hospital.  The above assessment, treatment plan, treatment alternatives and goals were discussed and mutually agreed upon: by patient  Juanna Cao, PT, DPT  07/21/2015, 12:13 PM

## 2015-07-21 NOTE — Progress Notes (Signed)
Social Work  Social Work Assessment and Plan  Patient Details  Name: Caroline Myers MRN: [id number] Date of Birth: 07-01-1924  Today's Date: 07/21/2015  Problem List:  Patient Active Problem List   Diagnosis Date Noted  . Hypokalemia   . AKI (acute kidney injury) ([place])   . Acute diastolic congestive heart failure ([place]) 07/20/2015  . Debility 07/20/2015  . Sepsis ([place]) 07/20/2015  . Acute on chronic diastolic congestive heart failure ([place])   . History of CVA (cerebrovascular accident)   . Tachypnea   . Leukocytosis 07/17/2015  . Arterial hypotension   . Hypotension 07/16/2015  . Elevated troponin 07/16/2015  . PNA (pneumonia)   . Severe sepsis ([place]) 07/15/2015  . Acute respiratory failure with hypercapnia ([place]) 07/15/2015  . Chronic anticoagulation 04/18/2015  . Orthostatic hypotension 03/09/2015  . CVA (cerebral infarction) 02/14/2015  . Stroke ([place]) 02/14/2015  . Chronic atrial fibrillation ([place]) 02/14/2015  . Stroke with cerebral ischemia ([place])   . Cerebral infarction due to embolism of left middle cerebral artery ([place])   . Occipital fracture ([place])   . Benign essential HTN   . Other allergic rhinitis   . Depression   . SAH (subarachnoid hemorrhage) ([place]) 01/02/2015  . SDH (subdural hematoma) ([place]) 01/02/2015  . Aortic atherosclerosis ([name]) 08/05/2014  . Nonspecific abnormal unspecified cardiovascular function study 10/28/2013  . Aortic valve disorder 10/28/2013  . HLD (hyperlipidemia) 07/28/2013  . TIA (transient ischemic attack) 07/27/2013  . History of CHF (congestive heart failure) 07/27/2013  . CVA (cerebral vascular accident) ([place]) 07/27/2013  . Urticaria 11/06/2010  . Bronchitis 10/12/2010  . Preventative health care   . Atrial fibrillation ([place]) 04/04/2010  . Anxiety state 03/29/2010  . ATRIAL FLUTTER 03/29/2010  . RIB PAIN, LEFT SIDED 10/12/2009  . ALLERGIC RHINITIS 06/28/2009  . SHOULDER PAIN, LEFT 06/28/2009  . SCIATICA, RIGHT 06/28/2009  .  HEADACHE 06/28/2009  . DYSPNEA ON EXERTION 06/28/2009  . SKIN CANCER, HX OF 06/28/2009   Past Medical History:  Past Medical History  Diagnosis Date  . Allergy     allergic rhinitis  . Cancer [facility])     history of skin cancer of the nose  . Asthma 2000    mild. tried Advair after bronchospasm after exposure to cats  . Shoulder pain, left 05/2010    with rotator cuff tear--Dr Alfonso Ramus  . Paroxysmal atrial fibrillation (HCC)     pt does not want anticoagulation  . Aortic stenosis     EF normal.  Mild aortic stenosis. Mild to moderate aortic regurgitation  . Anxiety   . Shortness of breath     occ  . GERD (gastroesophageal reflux disease)     occ  . Arthritis   . Stroke [facility])    Past Surgical History:  Past Surgical History  Procedure Laterality Date  . Hernia repair  2003  . Acromioplasty  2000    right shoulder, arthroscopic  . Lumbar laminectomy/decompression microdiscectomy  04/01/2012    Procedure: LUMBAR LAMINECTOMY/DECOMPRESSION MICRODISCECTOMY 1 LEVEL;  Surgeon: Eustace Moore, MD;  Location: [facility];  Service: Neurosurgery;  Laterality: Right;  Right Lumbar four-five extraforaminal microdiskectomy    Social History:  reports that she has never smoked. She has never used smokeless tobacco. She reports that she drinks alcohol. She reports that she does not use illicit drugs.  Family / Support Systems Marital Status: Married Patient Roles: Spouse, Parent, Other (Comment) (friend) Spouse/Significant Other: Herbie [place] [phone]-home Children: [name]-daughter [phone]-cell Other Supports: friends, they have  another daughter in [place] Anticipated Caregiver: pt and husband, [name] can only do intermittent assist Ability/Limitations of Caregiver: [name] is scheduled for hip replacemnt surgery and is debating putting ot off-she can not provide care. Pt's husband ambulates with a cane in the home, but transport chair in the community Caregiver Availability: 24/7 Family Dynamics: Pt  and husband have been managing on their own until this. Pt thought it was a cold then it turned into pneumonia.She is trying her best to be positive and rally from this. [name] is supportive and can transport them but has helath issues of her own to deal with. Their other duaghter is in [place].  Social History Preferred language: English Religion: None Cultural Background: No issues Education: Secretary/administrator educated Read: Yes Write: Yes Employment Status: Retired Freight forwarder Issues: No issues Guardian/Conservator: None-according to MD pt is capable of making her own decisions while here.    Abuse/Neglect Physical Abuse: Denies Verbal Abuse: Denies Sexual Abuse: Denies Exploitation of patient/patient's resources: Denies Self-Neglect: Denies  Emotional Status Pt's affect, behavior adn adjustment status: Pt has always been independent and healthy, until this. She is not used to being in a hospital and doesn't like it. She is trying to remain strong and positive to get through this. She worries about her daughter due to the stress being placed upon her and she is bipolar. She seems to be able to verbalize her concerns and just needs support to get thorugh this. Recent Psychosocial Issues: other health issues she has managed and remained independent Pyschiatric History: History of anxiety takes medication which she feels is helpful. Discussed also talking aobuther concerns and stressors would help. She may benefit from neuro-psych while here, but will let her adjust to our unit before referring her. Substance Abuse History: No issues  Patient / Family Perceptions, Expectations & Goals Pt/Family understanding of illness & functional limitations: Pt and friend can explain her pnuemonia and the treatment she has been receiving while in the hospital. She is aware she is on rehab to get stronger and finish her treatment for the pneumonia. She talks with the MD and feels her questions are being  addressed. Premorbid pt/family roles/activities: Wife, Mother, grandmother, retiree, friend, church member, etc Anticipated changes in roles/activities/participation: resume Pt/family expectations/goals: Pt states: " I want to be able to take care of myself, I need too, my husband can't."  Friend states: " I think she will do well here, she just needs our support."  [employer]: None Premorbid Home Care/DME Agencies: Other (Comment) (After back surgery 2013) Transportation available at discharge: Husband does still drive and daughter-[name] Resource referrals recommended: Neuropsychology  Discharge Planning Living Arrangements: Spouse/significant other Support Systems: Spouse/significant other, Children, Friends/neighbors, Church/faith community Type of Residence: Private residence Insurance Resources: [employer], Multimedia programmer (specify) Sports administrator) Financial Resources: Social Security, Family Support Financial Screen Referred: No Living Expenses: Own Money Management: Spouse, Patient Does the patient have any problems obtaining your medications?: No Home Management: Both she and husband do the home management Patient/Family Preliminary Plans: Return home with husband provdiign supervision if necessary, he can be there but not provide any physical care. Daughter is suppose to have scheduled hip replacment surgery 08/18/2015 but is unsure if she needs to move it again or not. Pt hope she is not here longer than two weeks, she feels that is too long to begin with. She wants to go home as soon as possible., due to stress she feels it is causing her husband and  daughter. Social Work Anticipated Follow Up Needs: HH/OP, Support Group  Clinical Impression Pleasant fragile lady who is still processing all that has happened to her, she can't believe due to a cold she is in the hospital with pneumonia. She feels it came on so fast. Her husband is in fairly good health but ambulates  with a cane and can not provide any physical assist. Their local daughter is scheduled to have hip replacement surgery in Feb and is debating whether to put it off again due to parent's situation and who will assist her after surgery. Pt should do well here and hopefully regain her independence. Her main issue is her lack of strength and catching her breath. Will await team's evaluations and come up with a safe discharge plan for all of them. Daughter is staying home today due to blood pressure is up from the stress from mom's hospitalization and going back and forth to the hospital to visit Mom. [name] 07/21/2015, 1:52 PM

## 2015-07-21 NOTE — Evaluation (Signed)
Occupational Therapy Assessment and Plan  Patient Details  Name: Caroline Myers MRN: [id number] Date of Birth: May 07, 1924  OT Diagnosis: muscle weakness (generalized) Rehab Potential: Rehab Potential (ACUTE ONLY): Good ELOS:   7-10 days  Today's Date: 07/21/2015 OT Individual Time: 9323-5573 OT Individual Time Calculation (min): 60 min     Problem List:  Patient Active Problem List   Diagnosis Date Noted  . Hypokalemia   . AKI (acute kidney injury) ([place])   . Acute diastolic congestive heart failure ([place]) 07/20/2015  . Debility 07/20/2015  . Sepsis ([place]) 07/20/2015  . Acute on chronic diastolic congestive heart failure ([place])   . History of CVA (cerebrovascular accident)   . Tachypnea   . Leukocytosis 07/17/2015  . Arterial hypotension   . Hypotension 07/16/2015  . Elevated troponin 07/16/2015  . PNA (pneumonia)   . Severe sepsis ([place]) 07/15/2015  . Acute respiratory failure with hypercapnia ([place]) 07/15/2015  . Chronic anticoagulation 04/18/2015  . Orthostatic hypotension 03/09/2015  . CVA (cerebral infarction) 02/14/2015  . Stroke ([place]) 02/14/2015  . Chronic atrial fibrillation ([place]) 02/14/2015  . Stroke with cerebral ischemia ([place])   . Cerebral infarction due to embolism of left middle cerebral artery ([place])   . Occipital fracture ([place])   . Benign essential HTN   . Other allergic rhinitis   . Depression   . SAH (subarachnoid hemorrhage) ([place]) 01/02/2015  . SDH (subdural hematoma) ([place]) 01/02/2015  . Aortic atherosclerosis ([place]) 08/05/2014  . Nonspecific abnormal unspecified cardiovascular function study 10/28/2013  . Aortic valve disorder 10/28/2013  . HLD (hyperlipidemia) 07/28/2013  . TIA (transient ischemic attack) 07/27/2013  . History of CHF (congestive heart failure) 07/27/2013  . CVA (cerebral vascular accident) ([place]) 07/27/2013  . Urticaria 11/06/2010  . Bronchitis 10/12/2010  . Preventative health care   . Atrial fibrillation ([place]) 04/04/2010  .  Anxiety state 03/29/2010  . ATRIAL FLUTTER 03/29/2010  . RIB PAIN, LEFT SIDED 10/12/2009  . ALLERGIC RHINITIS 06/28/2009  . SHOULDER PAIN, LEFT 06/28/2009  . SCIATICA, RIGHT 06/28/2009  . HEADACHE 06/28/2009  . DYSPNEA ON EXERTION 06/28/2009  . SKIN CANCER, HX OF 06/28/2009    Past Medical History:  Past Medical History  Diagnosis Date  . Allergy     allergic rhinitis  . Cancer [facility])     history of skin cancer of the nose  . Asthma 2000    mild. tried Advair after bronchospasm after exposure to cats  . Shoulder pain, left 05/2010    with rotator cuff tear--Dr Alfonso Ramus  . Paroxysmal atrial fibrillation (H[facility])     pt does not want anticoagulation  . Aortic stenosis     EF normal.  Mild aortic stenosis. Mild to moderate aortic regurgitation  . Anxiety   . Shortness of breath     occ  . GERD (gastroesophageal reflux disease)     occ  . Arthritis   . Stroke [facility])    Past Surgical History:  Past Surgical History  Procedure Laterality Date  . Hernia repair  2003  . Acromioplasty  2000    right shoulder, arthroscopic  . Lumbar laminectomy/decompression microdiscectomy  04/01/2012    Procedure: LUMBAR LAMINECTOMY/DECOMPRESSION MICRODISCECTOMY 1 LEVEL;  Surgeon: Eustace Moore, MD;  Location: [facility];  Service: Neurosurgery;  Laterality: Right;  Right Lumbar four-five extraforaminal microdiskectomy     Assessment & Plan Clinical Impression: Patient is a 82 y.o. right handed female with history of atrial fibrillation maintained on Eliquis, aortic stenosis, CVA 2. Patient  lives with spouse one level home 1 step to entry. Pt and spouse help each other. Ambulates without assistive device but only short distances. She has had recent falls over the past few weeks. Daughter is currently assisting at home as patient's husband is limited. Presented 07/15/2015 with hypotension with systolic pressures in the 60s and tachypnea, tachycardia as well as low-grade fever, productive cough.  Lactic acid level 3.03. Chest x-ray negative. Blood cultures completed showing no growth. Placed on empiric Zosyn. Follow-up chest x-ray bronchitic changes with questionable infiltrate versus atelectasis left lower lobe. Patient transitioned from intravenous Solu-Medrol to by mouth prednisone and taper as directed. Troponin mildly elevated 0.13 felt to be related to demand ischemia and follow-up per cardiology services. Placed on gentle IV fluids. Echocardiogram with ejection fraction of 55% and normal systolic function. Tolerating a regular consistency diet.    Patient transferred to CIR on 07/20/2015 .    Patient currently requires moderate assistance with basic self-care skills secondary to muscle weakness and decreased cardiorespiratoy endurance.  Prior to hospitalization, patient could complete BADL independently.   Patient will benefit from skilled intervention to increase independence with basic self-care skills prior to discharge home with care partner.  Anticipate patient will require intermittent supervision and follow up home health.  OT - End of Session Activity Tolerance: Tolerates 10 - 20 min activity with multiple rests Endurance Deficit: Yes Endurance Deficit Description: 02 sats 80-85% when standing supported. OT Assessment Rehab Potential (ACUTE ONLY): Good Barriers to Discharge: Decreased caregiver support OT Patient demonstrates impairments in the following area(s): Endurance;Safety;Other (Comment) OT Basic ADL's Functional Problem(s): Eating;Grooming;Bathing;Dressing;Toileting OT Advanced ADL's Functional Problem(s): Simple Meal Preparation;Light Housekeeping OT Transfers Functional Problem(s): Toilet;Tub/Shower OT Additional Impairment(s): None OT Plan OT Intensity: Minimum of 1-2 x/day, 45 to 90 minutes OT Frequency: 5 out of 7 days OT Duration/Estimated Length of Stay: 7-10 days OT Treatment/Interventions: Therapeutic Exercise;Therapeutic Activities;UE/LE Strength  taining/ROM;Functional mobility training;Patient/family education;Discharge planning;Disease mangement/prevention;Self Care/advanced ADL retraining;DME/adaptive equipment instruction OT Self Feeding Anticipated Outcome(s): Mod I OT Basic Self-Care Anticipated Outcome(s): Supervision OT Toileting Anticipated Outcome(s): Mod I OT Bathroom Transfers Anticipated Outcome(s): Supervision OT Recommendation Patient destination: Home Follow Up Recommendations: Home health OT Equipment Recommended: To be determined   Skilled Therapeutic Intervention OT 1:1 evaluation completed with treatment provided to address pt ed and training on diaphragmatic breathing, pursed-lip breathing, energy conservation, and effective use of DME/AE.   Pt received supine in bed, reporting no clothing present but is able to rise to sit at edge of bed, ambulate to bathroom and to sink to complete light bathing with setup and close supervision using supplemental oxygen (2LPM).  02 sats drop to range of 84-88 after mobility with SOB noted.   Pt recovers resting seated but requires repeated rest breaks during task.   No evidence of LOB noted and steadying assist is required while standing and pt requires intermittent mod assist to stand.   Pt recovers to recliner at end of session with all needs placed within reach.  OT Evaluation Precautions/Restrictions  Precautions Precautions: Fall Precaution Comments: watch O2 Restrictions Weight Bearing Restrictions: No  General Chart Reviewed: Yes Family/Caregiver Present: No  Vital Signs Therapy Vitals Temp: 98.2 F (36.8 C) Temp Source: Oral Pulse Rate: 70 Resp: 17 BP: (!) 144/84 mmHg Patient Position (if appropriate): Lying Oxygen Therapy SpO2: 96 % O2 Device: Nasal Cannula O2 Flow Rate (L/min): 2 L/min  Pain Pain Assessment Pain Assessment: 0-10 Pain Score: 3  Pain Type: Acute pain Pain Location: Back Pain  Orientation: Mid Pain Descriptors / Indicators:  Aching Pain Intervention(s): Repositioned;Distraction  Home Living/Prior Functioning Home Living Family/patient expects to be discharged to:: Private residence Living Arrangements: Spouse/significant other Available Help at Discharge: Family, Available 24 hours/day Type of Home: House Home Access: Stairs to enter [employer] of Steps: 1 brick step Entrance Stairs-Rails: None Home Layout: One level Bathroom Shower/Tub: Chiropodist: Standard Bathroom Accessibility: Yes Additional Comments: pt ambulates without assistive device and husband uses a cane.  Has had several falls per pt.  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, retired Systems developer Leisure and Hobbies: entertain friends at home or with couples Prior Function Level of Independence: Independent with gait, Independent with transfers  Able to Take Stairs?: Yes Comments: fell once in past year with skull fx when fell backwards placing dish in cabinet  ADL ADL ADL Comments: see Functional Assessment Tool  Vision/Perception  Vision- History Baseline Vision/History: No visual deficits Patient Visual Report: No change from baseline Vision- Assessment Vision Assessment?: No apparent visual deficits Perception Comments: WFL   Cognition Overall Cognitive Status: Within Functional Limits for tasks assessed Arousal/Alertness: Awake/alert Orientation Level: Person;Place;Situation Person: Oriented Place: Oriented Situation: Oriented Year: 2017 Month: January Day of Week: Correct Memory: Appears intact Immediate Memory Recall: Sock;Blue;Bed Memory Recall: Sock;Blue;Bed Memory Recall Sock: Without Cue Memory  Recall Blue: Without Cue Memory Recall Bed: Without Cue Attention: Selective Selective Attention: Appears intact Awareness: Appears intact Problem Solving: Impaired Problem Solving Impairment: Functional complex;Verbal complex Executive Function: Self Monitoring Self Monitoring: Impaired Self Monitoring Impairment: Functional basic Safety/Judgment: Appears intact  Sensation Sensation Light Touch: Appears Intact Stereognosis: Appears Intact Hot/Cold: Appears Intact Proprioception: Appears Intact Coordination Gross Motor Movements are Fluid and Coordinated: Yes Fine Motor Movements are Fluid and Coordinated: Yes  Motor  Motor Motor: Within Functional Limits  Mobility  Bed Mobility Bed Mobility: Rolling Right;Right Sidelying to Sit;Sitting - Scoot to Edge of Bed Rolling Right: 5: Supervision Rolling Right Details: Verbal cues for technique;Visual cues for safe use of DME/AD Right Sidelying to Sit: HOB elevated;With rails;4: Min guard Sitting - Scoot to [employer] of Bed: 5: Supervision Sitting - Scoot to [employer] of Bed Details: Verbal cues for technique;Visual cues for safe use of DME/AD Transfers Transfers: Sit to Stand;Stand to Sit Sit to Stand: 3: Mod assist Sit to Stand Details: Manual facilitation for weight shifting;Verbal cues for technique;Visual cues for safe use of DME/AE Stand to Sit: 4: Min assist;With armrests;With upper extremity assist   Trunk/Postural Assessment  Cervical Assessment Cervical Assessment: Within Functional Limits Thoracic Assessment Thoracic Assessment: Exceptions to [facility] Thoracic AROM Overall Thoracic AROM: Due to premorid status (scoloios) Lumbar Assessment Lumbar Assessment: Exceptions to [facility] Lumbar AROM Overall Lumbar AROM: Unable to assess;Due to premorid status Postural Control Postural Control: Within Functional Limits   Balance Dynamic Sitting Balance Sitting balance - Comments: needs UE support to balance in sitting.    Extremity/Trunk Assessment RUE Assessment RUE Assessment: Exceptions to [facility] RUE Strength,  RUE Overall Strength: Due to premorbid status (hx of RTC repair, -3/5 strength) LUE Assessment LUE Assessment: Within Functional Limits, overall strength only 3+/5  See Function Navigator for Current Functional Status.   Refer to Care Plan for Long Term Goals  Recommendations for other services: None  Discharge Criteria: Patient will be discharged from OT if patient refuses treatment 3  consecutive times without medical reason, if treatment goals not met, if there is a change in medical status, if patient makes no progress towards goals or if patient is discharged from hospital.  The above assessment, treatment plan, treatment alternatives and goals were discussed and mutually agreed upon: by patient  [facility] 07/21/2015, 2:44 PM

## 2015-07-22 ENCOUNTER — Inpatient Hospital Stay (HOSPITAL_COMMUNITY): Payer: Medicare Other

## 2015-07-22 MED ORDER — SACCHAROMYCES BOULARDII 250 MG PO CAPS
250.0000 mg | ORAL_CAPSULE | Freq: Two times a day (BID) | ORAL | Status: DC
  Administered 2015-07-22 – 2015-07-29 (×15): 250 mg via ORAL
  Filled 2015-07-22 (×15): qty 1

## 2015-07-22 MED ORDER — POTASSIUM CHLORIDE CRYS ER 20 MEQ PO TBCR
40.0000 meq | EXTENDED_RELEASE_TABLET | Freq: Two times a day (BID) | ORAL | Status: AC
  Administered 2015-07-22 – 2015-07-23 (×4): 40 meq via ORAL
  Filled 2015-07-22 (×5): qty 2

## 2015-07-22 MED ORDER — TRAZODONE HCL 50 MG PO TABS
25.0000 mg | ORAL_TABLET | Freq: Every evening | ORAL | Status: DC | PRN
  Administered 2015-07-22 – 2015-07-25 (×4): 25 mg via ORAL
  Filled 2015-07-22 (×3): qty 1

## 2015-07-22 NOTE — Progress Notes (Signed)
[facility] PHYSICAL MEDICINE & REHABILITATION     PROGRESS NOTE  Subjective/Complaints: RN reports increased soft, yet formed stools---seems to be more prevalent per pt since starting augmentin. She still feels a little short of breath but feels her breathing is "out of sync" and sometimes she feels it's her nerves---longer term problem.      ROS: Denies CP, SOB, n/v/d.   Objective: Vital Signs: Blood pressure 128/76, pulse 70, temperature 98.3 F (36.8 C), temperature source Oral, resp. rate 18, weight 63.4 kg (139 lb 12.4 oz), SpO2 96 %. No results found.  Recent Labs  07/20/15 1017 07/21/15 0616  WBC 16.9* 15.9*  HGB 14.2 14.5  HCT 45.0 42.0  PLT 269 266    Recent Labs  07/21/15 0616  NA 140  K 3.1*  CL 100*  GLUCOSE 109*  BUN 26*  CREATININE 1.08*  CALCIUM 7.9*   CBG (last 3)  No results for input(s): GLUCAP in the last 72 hours.  Wt Readings from Last 3 Encounters:  07/22/15 63.4 kg (139 lb 12.4 oz)  07/20/15 72.122 kg (159 lb)  05/30/15 64.864 kg (143 lb)    Physical Exam:  BP 128/76 mmHg  Pulse 70  Temp(Src) 98.3 F (36.8 C) (Oral)  Resp 18  Wt 63.4 kg (139 lb 12.4 oz)  SpO2 96% Constitutional:  No distress. Frail 82 year old Caucasian female  HENT: Negative JVD. Head: Normocephalic.  Eyes: Conjunctivae and EOM are normal.  Cardiovascular: Irregular irregular with systolic murmur Respiratory: She has minimal wheezes, crackles at left base, decreased sounds right base. Air movement fair. No distress GI: Soft. Bowel sounds are normal. She exhibits no distension.  MSK: 1+ right pretibial edema. No tenderness Neurological: She is alert and oriented.   Motor: B/l UE 4/5 proximal to distal B/l LE 4+/5 proximal to distal  Skin: Skin is warm and dry.  Ecchymosis dorsal right hand, Ecchymosis right anterior and medial leg Psychiatric: Her behavior is normal. Judgment normal.   Assessment/Plan: 1. Functional deficits secondary to sepsis/pneumonia  which require 3+ hours per day of interdisciplinary therapy in a comprehensive inpatient rehab setting. Physiatrist is providing close team supervision and 24 hour management of active medical problems listed below. Physiatrist and rehab team continue to assess barriers to discharge/monitor patient progress toward functional and medical goals.  Function:  Bathing Bathing position      Bathing parts      Bathing assist        Upper Body Dressing/Undressing Upper body dressing                    Upper body assist        Lower Body Dressing/Undressing Lower body dressing                                  Lower body assist        Toileting Toileting   Toileting steps completed by patient: Performs perineal hygiene Toileting steps completed by helper: Adjust clothing prior to toileting, Adjust clothing after toileting Toileting Assistive Devices: Grab bar or rail  Toileting assist Assist level: Touching or steadying assistance (Pt.75%)   Transfers Chair/bed transfer   Chair/bed transfer method: Stand pivot, Ambulatory Chair/bed transfer assist level: Touching or steadying assistance (Pt > 75%) Chair/bed transfer assistive device: Armrests     Locomotion Ambulation     Max distance: 100' Assist level: Touching or steadying assistance (Pt >  75%)   Wheelchair       Assist Level: Dependent (Pt equals 0%)  Cognition Comprehension Comprehension assist level: Follows basic conversation/direction with extra time/assistive device  Expression Expression assist level: Expresses complex 90% of the time/cues < 10% of the time  Social Interaction Social Interaction assist level: Interacts appropriately with others with medication or extra time (anti-anxiety, antidepressant).  Problem Solving Problem solving assist level: Solves basic 90% of the time/requires cueing < 10% of the time  Memory Memory assist level: Recognizes or recalls 90% of the time/requires  cueing < 10% of the time    Medical Problem List and Plan: 1. Debilitation/acute respiratory failure secondary to sepsis/pneumonia. Continue nebulizer as directed.  2. DVT Prophylaxis/Anticoagulation: Eliquis 3. Pain Management: Tylenol 4. Mood: Zoloft 50 mg daily. Provide emotional support 5. Neuropsych: This patient is capable of making decisions on her own behalf. 6. Skin/Wound Care: Routine skin checks 7. Fluids/Electrolytes/Nutrition: Routine I&O's  Hypokalemia; 3.1 on 1/20--continue to supplement  -encourage fluids (slightly pre-renal).  I personally reviewed the patient's labs today.  8. Atrial fibrillation. Cardiac rate control. Continue Cardizem 180 mg daily 9. Leukocytosis/pneumonia  -zosyn stopped now on augmentin  -prednisone taper   Wbc's around 16k still--CXR ordered for today but not done. Likely steroid component  -re-order cxr and view when available  -discussed IS with patient  -appears stable at present, afebrile  -full labs drawn yesterday. All personally reviewed.  10. AKI  Cr. 1.08 on 1/20  Will encourage fluid intake and consider IVF if necessary  -recheck labs tomorrow  LOS (Days) 2 A FACE TO FACE EVALUATION WAS PERFORMED  Margart Zemanek T 07/22/2015 10:25 AM

## 2015-07-23 ENCOUNTER — Inpatient Hospital Stay (HOSPITAL_COMMUNITY): Payer: Medicare Other | Admitting: Occupational Therapy

## 2015-07-23 ENCOUNTER — Inpatient Hospital Stay (HOSPITAL_COMMUNITY): Payer: Medicare Other | Admitting: *Deleted

## 2015-07-23 ENCOUNTER — Inpatient Hospital Stay (HOSPITAL_COMMUNITY): Payer: Medicare Other | Admitting: Physical Therapy

## 2015-07-23 LAB — CBC
HEMATOCRIT: 46.4 % — AB (ref 36.0–46.0)
HEMOGLOBIN: 15.4 g/dL — AB (ref 12.0–15.0)
MCH: 33.5 pg (ref 26.0–34.0)
MCHC: 33.2 g/dL (ref 30.0–36.0)
MCV: 100.9 fL — ABNORMAL HIGH (ref 78.0–100.0)
Platelets: 276 10*3/uL (ref 150–400)
RBC: 4.6 MIL/uL (ref 3.87–5.11)
RDW: 14.1 % (ref 11.5–15.5)
WBC: 17.3 10*3/uL — AB (ref 4.0–10.5)

## 2015-07-23 LAB — BASIC METABOLIC PANEL
ANION GAP: 10 (ref 5–15)
BUN: 28 mg/dL — ABNORMAL HIGH (ref 6–20)
CALCIUM: 8.5 mg/dL — AB (ref 8.9–10.3)
CHLORIDE: 102 mmol/L (ref 101–111)
CO2: 28 mmol/L (ref 22–32)
CREATININE: 0.95 mg/dL (ref 0.44–1.00)
GFR, EST AFRICAN AMERICAN: 60 mL/min — AB (ref >60)
GFR, EST NON AFRICAN AMERICAN: 52 mL/min — AB (ref >60)
Glucose, Bld: 108 mg/dL — ABNORMAL HIGH (ref 65–99)
Potassium: 4 mmol/L (ref 3.5–5.1)
SODIUM: 140 mmol/L (ref 135–145)

## 2015-07-23 MED ORDER — NYSTATIN 100000 UNIT/ML MT SUSP
5.0000 mL | Freq: Four times a day (QID) | OROMUCOSAL | Status: DC
  Administered 2015-07-23 – 2015-07-29 (×22): 500000 [IU] via ORAL
  Filled 2015-07-23 (×20): qty 5

## 2015-07-23 NOTE — Progress Notes (Signed)
Occupational Therapy Session Note  Patient Details  Name: Caroline Myers MRN: [id number] Date of Birth: 24-Apr-1924  Today's Date: 07/23/2015 OT Individual Time: [id number] OT Individual Time Calculation (min): 55 min    Short Term Goals: Week 1:  OT Short Term Goal 1 (Week 1): STG=LTG d/t short LOS  Skilled Therapeutic Interventions/Progress Updates: Patient asked to brush teeth & completed with setup.    She stated, "I have been feeling like I have hair and cobwebs on my tongue for a while now.   Look at that stuff on my tongue.   I hope I do not have Thrush."    She stated that she has a gag reflex when she brushes her tongue bu proceeded to brush a little and gently.   Patient asked This clinician offered to pass patient observation and concern onto her nurse.   So, nurse was informed of patient concern.        Patient also participated in standing bilateral arm exercises to increase endurance and trunk stability.    Patient grew fatigued quickly and sat for the rest of her exercises.  Patient exhibited bilateral shoulder active flexion 0-95 degrees and stated she has right rotator cuff issues and stated she'd "pulled a muscle" years ago gardening with her left shoulder.        She was able to return demonstration for shoulder pendulum exercises on each arm.    This clinican hand wrote out instructions for doing a few as tolerable.   Patient may benefit as well from the pictorial handsouts if they can be located. Therapy Documentation Precautions:  Precautions Precautions: Fall Precaution Comments: watch O2 Restrictions Weight Bearing Restrictions: No  Pain: Pain Assessment Pain Assessment: No/denies pain  See Function Navigator for Current Functional Status.   Therapy/Group: Individual Therapy  Alfredia Ferguson [facility] 07/23/2015, 11:34 AM

## 2015-07-23 NOTE — Progress Notes (Addendum)
[facility] PHYSICAL MEDICINE & REHABILITATION     PROGRESS NOTE  Subjective/Complaints:  A little restless last night. Denies any breathing difficulties. No fevers. No pain. Admits to being a little anxious.      ROS: Denies CP, SOB, n/v/d.   Objective: Vital Signs: Blood pressure 96/53, pulse 85, temperature 97.9 F (36.6 C), temperature source Oral, resp. rate 18, weight 62.1 kg (136 lb 14.5 oz), SpO2 95 %. Dg Chest 2 View  07/22/2015  CLINICAL DATA:  Pneumonia, coughing for 2 weeks EXAM: CHEST  2 VIEW COMPARISON:  07/19/2015 FINDINGS: Cardiomegaly again noted. Dextroscoliosis thoracic spine. No pulmonary edema. There is bilateral small pleural effusion with bilateral basilar atelectasis or infiltrate right greater than left. IMPRESSION: No pulmonary edema. Cardiomegaly. Thoracic dextroscoliosis. Persistent bilateral small pleural effusion with bilateral basilar atelectasis or infiltrate right greater than left. Electronically Signed   By: Lahoma Crocker M.D.   On: 07/22/2015 13:36    Recent Labs  07/21/15 0616 07/23/15 0919  WBC 15.9* 17.3*  HGB 14.5 15.4*  HCT 42.0 46.4*  PLT 266 276    Recent Labs  07/21/15 0616 07/23/15 0919  NA 140 140  K 3.1* 4.0  CL 100* 102  GLUCOSE 109* 108*  BUN 26* 28*  CREATININE 1.08* 0.95  CALCIUM 7.9* 8.5*   CBG (last 3)  No results for input(s): GLUCAP in the last 72 hours.  Wt Readings from Last 3 Encounters:  07/23/15 62.1 kg (136 lb 14.5 oz)  07/20/15 72.122 kg (159 lb)  05/30/15 64.864 kg (143 lb)    Physical Exam:  BP 96/53 mmHg  Pulse 85  Temp(Src) 97.9 F (36.6 C) (Oral)  Resp 18  Wt 62.1 kg (136 lb 14.5 oz)  SpO2 95% Constitutional:  No distress. Frail appearing HENT: Negative JVD. Head: Normocephalic.  Eyes: Conjunctivae and EOM are normal.  Cardiovascular: Irregular irregular with systolic murmur Respiratory: She has decreased sounds at bases. Air movement fair to good. No distress GI: Soft. Bowel sounds are  normal. She exhibits no distension.  MSK: 1+ right pretibial edema. No tenderness Neurological: She is alert and oriented.   Motor: B/l UE 4/5 proximal to distal B/l LE 4+/5 proximal to distal  Skin: Skin is warm and dry.  Ecchymosis dorsal right hand, Ecchymosis right anterior and medial leg Psychiatric: Her behavior is a little anxious. Judgment normal.   Assessment/Plan: 1. Functional deficits secondary to sepsis/pneumonia which require 3+ hours per day of interdisciplinary therapy in a comprehensive inpatient rehab setting. Physiatrist is providing close team supervision and 24 hour management of active medical problems listed below. Physiatrist and rehab team continue to assess barriers to discharge/monitor patient progress toward functional and medical goals.  Function:  Bathing Bathing position   Position: Shower  Bathing parts Body parts bathed by patient: Right arm, Left arm, Chest, Abdomen, Front perineal area Body parts bathed by helper: Back, Buttocks, Right upper leg, Left upper leg, Right lower leg, Left lower leg  Bathing assist Assist Level: Touching or steadying assistance(Pt > 75%)      Upper Body Dressing/Undressing Upper body dressing   What is the patient wearing?: Bra, Pull over shirt/dress Bra - Perfomed by patient: Thread/unthread right bra strap, Thread/unthread left bra strap Bra - Perfomed by helper: Hook/unhook bra (pull down sports bra) Pull over shirt/dress - Perfomed by patient: Thread/unthread right sleeve, Thread/unthread left sleeve Pull over shirt/dress - Perfomed by helper: Pull shirt over trunk, Put head through opening  Upper body assist Assist Level: Touching or steadying assistance(Pt > 75%)      Lower Body Dressing/Undressing Lower body dressing   What is the patient wearing?: Underwear, Pants, Non-skid slipper socks Underwear - Performed by patient: Pull underwear up/down Underwear - Performed by helper: Thread/unthread right  underwear leg, Thread/unthread left underwear leg Pants- Performed by patient: Pull pants up/down Pants- Performed by helper: Thread/unthread right pants leg, Thread/unthread left pants leg   Non-skid slipper socks- Performed by helper: Don/doff right sock, Don/doff left sock                  Lower body assist Assist for lower body dressing: Touching or steadying assistance (Pt > 75%)      Toileting Toileting   Toileting steps completed by patient: Performs perineal hygiene Toileting steps completed by helper: Adjust clothing prior to toileting, Adjust clothing after toileting, Performs perineal hygiene Toileting Assistive Devices: Grab bar or rail  Toileting assist Assist level: Touching or steadying assistance (Pt.75%)   Transfers Chair/bed transfer   Chair/bed transfer method: Stand pivot, Ambulatory Chair/bed transfer assist level: Touching or steadying assistance (Pt > 75%) Chair/bed transfer assistive device: Armrests     Locomotion Ambulation     Max distance: 100' Assist level: Touching or steadying assistance (Pt > 75%)   Wheelchair   Type: Manual Max wheelchair distance: 200 Assist Level: Supervision or verbal cues  Cognition Comprehension Comprehension assist level: Follows basic conversation/direction with extra time/assistive device  Expression Expression assist level: Expresses complex 90% of the time/cues < 10% of the time  Social Interaction Social Interaction assist level: Interacts appropriately with others with medication or extra time (anti-anxiety, antidepressant).  Problem Solving Problem solving assist level: Solves complex 90% of the time/cues < 10% of the time  Memory Memory assist level: Recognizes or recalls 90% of the time/requires cueing < 10% of the time    Medical Problem List and Plan: 1. Debilitation/acute respiratory failure secondary to sepsis/pneumonia. Continue nebulizer as directed.  2. DVT Prophylaxis/Anticoagulation: Eliquis  to continue 3. Pain Management: Tylenol 4. Mood: Zoloft 50 mg daily. Provide emotional support 5. Neuropsych: This patient is capable of making decisions on her own behalf. 6. Skin/Wound Care: Routine skin checks 7. Fluids/Electrolytes/Nutrition: Routine I&O's  Hypokalemia; 3.1 on 1/20-up to 4.0 today  -labs scheduled for am  -weight around 62-63kg (not sure 68kg reading was correct) 8. Atrial fibrillation. Cardiac rate control. Continue Cardizem 180 mg daily 9. Leukocytosis/pneumonia  -zosyn stopped now on augmentin  -prednisone taper   -yesterday's CXR yesterday personally reviewed and shows general improvement. Small pleural effusions are present  -reviewed plan and status with daughter at length yesterday  -IS/FV  -appears stable at present, afebrile  -wbc's 17k today, likely steroid effect. Labs pending for morning 10. AKI  Cr. .95 today although  BUN sl up.   Will encourage fluid intake and consider IVF if necessary  -more labs scheduled for am  LOS (Days) 3 A FACE TO FACE EVALUATION WAS PERFORMED  Esdras Delair T 07/23/2015 4:31 PM

## 2015-07-23 NOTE — Progress Notes (Signed)
Physical Therapy Session Note  Patient Details  Name: Caroline Myers MRN: [id number] Date of Birth: May 24, 1924  Today's Date: 07/23/2015 PT Individual Time: 1300-1400 PT Individual Time Calculation (min): 60 min     Skilled Therapeutic Interventions/Progress Updates:  Patient in room , agrees to therapy , no complains of pain or discomfort noted.  NUSTep 2 x 5 min with rest break, supplemental O2 delivered via [place] at 2L/min continues, patient maintains saturation over 90 % through the whole exercises time.Performed in order to improve activity tolerance and facilitate cardiopulmonary capacity . Therapeutic exercises in sitting with blue resistance band 2 x 15 abd, 2 x 15 hamstring curls and 2 x 15 LAQ.  Sit to stand 2 x 5 in a row with encouragement not to use UE, mat raised to 22 inches. 5 time sit to stand time is; from 22 inch  17.36 second from 20 inch-24 seconds.  Short distance ambulation with Min A due to LOB.  Patient very fatigued and with shortness of breath with exertion, requires increased and frequent rest breaks.  At the end of the session patient returned to room and transferred to bed with min A. All needs within reach.  Therapy Documentation Precautions:  Precautions Precautions: Fall Precaution Comments: watch O2 Restrictions Weight Bearing Restrictions: No  See Function Navigator for Current Functional Status.   Therapy/Group: Individual Therapy  [name] 07/23/2015, 3:37 PM

## 2015-07-23 NOTE — Progress Notes (Signed)
Physical Therapy Session Note  Patient Details  Name: RIKAYLA GROAH MRN: [id number] Date of Birth: Mar 06, 1924  Today's Date: 07/23/2015 PT Individual Time: 1445-1530 PT Individual Time Calculation (min): 45 min   Short Term Goals: Week 1:  PT Short Term Goal 1 (Week 1): = LTGs due to short LOS at supervision level overall  Skilled Therapeutic Interventions/Progress Updates:  Pt was seen bedside in the pm. Pt transferred supine to edge of bed with head of bed elevated, side rail and S with increased time. Pt transferred edge of bed to w/c with min guard to min A. Pt propelled w/c about 200 feet with B LEs for LE strengthening and aerobic endurance. Pt performed step taps and alternating step taps 3 sets x 10 reps each. Pt performed multiple sit to stand transfers with min guard to min A. Pt returned to room. Pt transferred w/c to edge of bed with min guard to min A. Pt transferred edge of bed to supine with S and side rail. Pt left sitting up in bed with call bell within reach.   Therapy Documentation Precautions:  Precautions Precautions: Fall Precaution Comments: watch O2 Restrictions Weight Bearing Restrictions: No General:   Pain: No c/o pain.   See Function Navigator for Current Functional Status.   Therapy/Group: Individual Therapy  Dub Amis 07/23/2015, 3:41 PM

## 2015-07-23 NOTE — Progress Notes (Signed)
Occupational Therapy Session Note  Patient Details  Name: MIKO PAK MRN: [id number] Date of Birth: 03-18-1924  Today's Date: 07/23/2015 OT Individual Time:  -   0800-0910   (70 min)      Short Term Goals: Week 1:  OT Short Term Goal 1 (Week 1): STG=LTG d/t short LOS Week 2:     Skilled Therapeutic Interventions/Progress Updates:     . Pt. Lying in bed upon OT arrival. Engaged in diaphramatic breathing using upright posture. Engaged in functional mobility with pt ambulating to toilet and then to shower stall and performing transfer to tub transfer bench.Pt was continent of bowels.  OT assisted with cleaning due to quantity.  Pt. Ambulated to shower with RW and 2 liters of O2.    OT provided cues fo holding grab bars and turning to avoid tripping over O2 cord. Incorporated diaphragmatic breathing as she moved. Pt. Bathed self and washed hair with mod assist.  Transferred to wc.  OT assisted with dressing EOC at sink due to time frame.  Left pt in wc and left with all needs in reach. .   Therapy Documentation Precautions:  Precautions Precautions: Fall Precaution Comments: watch O2 Restrictions Weight Bearing Restrictions: No      Pain: Pain Assessment Pain Assessment: No/denies pain Pain Score: 0-No pain Faces Pain Scale: No hurt ADL: ADL ADL Comments: see Functional Assessment Tool     See Function Navigator for Current Functional Status.   Therapy/Group: Individual Therapy  Lisa Roca 07/23/2015, 7:46 AM

## 2015-07-24 ENCOUNTER — Ambulatory Visit: Payer: Medicare Other | Admitting: Neurology

## 2015-07-24 ENCOUNTER — Inpatient Hospital Stay (HOSPITAL_COMMUNITY): Payer: Medicare Other | Admitting: Physical Therapy

## 2015-07-24 ENCOUNTER — Inpatient Hospital Stay (HOSPITAL_COMMUNITY): Payer: Medicare Other

## 2015-07-24 DIAGNOSIS — D72829 Elevated white blood cell count, unspecified: Secondary | ICD-10-CM

## 2015-07-24 DIAGNOSIS — R05 Cough: Secondary | ICD-10-CM

## 2015-07-24 DIAGNOSIS — R059 Cough, unspecified: Secondary | ICD-10-CM | POA: Insufficient documentation

## 2015-07-24 LAB — BASIC METABOLIC PANEL
ANION GAP: 8 (ref 5–15)
BUN: 28 mg/dL — ABNORMAL HIGH (ref 6–20)
CHLORIDE: 100 mmol/L — AB (ref 101–111)
CO2: 33 mmol/L — ABNORMAL HIGH (ref 22–32)
Calcium: 8.7 mg/dL — ABNORMAL LOW (ref 8.9–10.3)
Creatinine, Ser: 0.96 mg/dL (ref 0.44–1.00)
GFR, EST AFRICAN AMERICAN: 59 mL/min — AB (ref >60)
GFR, EST NON AFRICAN AMERICAN: 51 mL/min — AB (ref >60)
Glucose, Bld: 131 mg/dL — ABNORMAL HIGH (ref 65–99)
POTASSIUM: 4.3 mmol/L (ref 3.5–5.1)
SODIUM: 141 mmol/L (ref 135–145)

## 2015-07-24 LAB — CBC WITH DIFFERENTIAL/PLATELET
BASOS ABS: 0 10*3/uL (ref 0.0–0.1)
Basophils Relative: 0 %
EOS ABS: 0.1 10*3/uL (ref 0.0–0.7)
EOS PCT: 0 %
HCT: 45.3 % (ref 36.0–46.0)
HEMOGLOBIN: 14.8 g/dL (ref 12.0–15.0)
LYMPHS ABS: 1.2 10*3/uL (ref 0.7–4.0)
Lymphocytes Relative: 6 %
MCH: 33.5 pg (ref 26.0–34.0)
MCHC: 32.7 g/dL (ref 30.0–36.0)
MCV: 102.5 fL — ABNORMAL HIGH (ref 78.0–100.0)
Monocytes Absolute: 1.6 10*3/uL — ABNORMAL HIGH (ref 0.1–1.0)
Monocytes Relative: 8 %
NEUTROS PCT: 86 %
Neutro Abs: 18 10*3/uL — ABNORMAL HIGH (ref 1.7–7.7)
PLATELETS: 252 10*3/uL (ref 150–400)
RBC: 4.42 MIL/uL (ref 3.87–5.11)
RDW: 14.1 % (ref 11.5–15.5)
WBC: 20.9 10*3/uL — AB (ref 4.0–10.5)

## 2015-07-24 NOTE — Progress Notes (Signed)
Occupational Therapy Session Note  Patient Details  Name: Caroline Myers MRN: [id number] Date of Birth: Aug 22, 1923  Today's Date: 07/24/2015 OT Individual Time: [id number] OT Individual Time Calculation (min): 60 min    Short Term Goals: Week 1:  OT Short Term Goal 1 (Week 1): STG=LTG d/t short LOS  Skilled Therapeutic Interventions/Progress Updates: ADL-retraining with focus on symptom management (anxiety and dyspnea), energy conservation, dynamic standing balance, memory and attention.   Pt received supine in bed with HOB elevated although with pt in some distress d/t recurrent cough.  Pt educated on improved positioning and re-educated on use of bed rails and electric bed controls to improve upright posture.   Pt advised to resume BADL and she completed bed mobility and transfer to w/c with overall min assist and vc for technique.   Pt self-fed and progressed to bathing/dressing at sink.  With extra time, setup and intermittent assist pt completed BADL with overall moderate assist to dress lower body and steadying assist when standing to pull up pants.   Pt returned to w/c and rested at end of session with all needs placed within reach.     Therapy Documentation Precautions:  Precautions Precautions: Fall Precaution Comments: watch O2 Restrictions Weight Bearing Restrictions: No  Vital Signs: Therapy Vitals Temp: 97.8 F (36.6 C) Temp Source: Oral Pulse Rate: 86 Resp: 16 BP: (!) 145/87 mmHg Patient Position (if appropriate): Lying Oxygen Therapy SpO2: 97 % O2 Device: Nasal Cannula O2 Flow Rate (L/min): 2 L/min   Pain: Pain Assessment Pain Assessment: No/denies pain  ADL: ADL ADL Comments: see Functional Assessment Tool  See Function Navigator for Current Functional Status.   Therapy/Group: Individual Therapy  Sehaj Mcenroe 07/24/2015, 8:29 AM

## 2015-07-24 NOTE — Progress Notes (Signed)
Physical Therapy Session Note  Patient Details  Name: Caroline Myers MRN: [id number] Date of Birth: [birth year]  Today's Date: 07/24/2015 PT Individual Time: 1025-1115 PT Individual Time Calculation (min): 50 min   Short Term Goals: Week 1:  PT Short Term Goal 1 (Week 1): = LTGs due to short LOS at supervision level overall  Skilled Therapeutic Interventions/Progress Updates:   Pt received in w/c; no c/o pain at rest but reporting some lower abdominal pain when she coughs a lot-pt reports a h/o hernia a few years ago.  Educated pt on bracing abdomen with a pillow during coughing episodes.  Pt reporting need to use toilet.  Performed stand pivot transfers w/c <> toilet without AD but with UE support on grab bar with min A.  Required min A to maintain balance in standing while doffing/donning clothing and for hygiene.  In gym pt participated in BERG balance assessment; see below for details.  Pt educated on falls risk and areas of balance to continue to address.  Pt returned to room and left in w/c with all items within reach; pt tolerated well with seated rest breaks to assess 02.  Pt able to maintain Sp02 > 90% on 2L 02 but pt's goal is to D/C without 02.    Therapy Documentation Precautions:  Precautions Precautions: Fall Precaution Comments: watch O2 Restrictions Weight Bearing Restrictions: No Vital Signs: Therapy Vitals Pulse Rate: 78 BP: 111/67 mmHg Patient Position (if appropriate): Sitting Oxygen Therapy SpO2: 98 % O2 Device: Nasal Cannula O2 Flow Rate (L/min): 2 L/min Pulse Oximetry Type: Intermittent Pain: Pain Assessment Pain Assessment: No/denies pain Balance: Balance Balance Assessed: Yes Standardized Balance Assessment Standardized Balance Assessment: Berg Balance Test Berg Balance Test Sit to Stand: Able to stand  independently using hands Standing Unsupported: Able to stand safely 2 minutes Sitting with Back Unsupported but Feet Supported on Floor or Stool:  Able to sit safely and securely 2 minutes Stand to Sit: Sits independently, has uncontrolled descent Transfers: Able to transfer with verbal cueing and /or supervision Standing Unsupported with Eyes Closed: Able to stand 10 seconds safely Standing Ubsupported with Feet Together: Needs help to attain position but able to stand for 30 seconds with feet together From Standing, Reach Forward with Outstretched Arm: Can reach forward >12 cm safely (5") From Standing Position, Pick up Object from Floor: Able to pick up shoe, needs supervision From Standing Position, Turn to Look Behind Over each Shoulder: Turn sideways only but maintains balance Turn 360 Degrees: Needs assistance while turning Standing Unsupported, Alternately Place Feet on Step/Stool: Able to complete >2 steps/needs minimal assist Standing Unsupported, One Foot in Front: Needs help to step but can hold 15 seconds Standing on One Leg: Unable to try or needs assist to prevent fall Total Score: 29 Patient demonstrates increased fall risk as noted by score of 29/56 on Berg Balance Scale.  (<36= high risk for falls, close to 100%; 37-45 significant >80%; 46-51 moderate >50%; 52-55 lower >25%)   See Function Navigator for Current Functional Status.   Therapy/Group: Individual Therapy  Raylene Everts [facility] 07/24/2015, 12:46 PM

## 2015-07-24 NOTE — Progress Notes (Signed)
[place] PHYSICAL MEDICINE & REHABILITATION     PROGRESS NOTE  Subjective/Complaints:  Patient seen working with OT without supplemental oxygen. Patient states she slept fairly overnight due to a cough that she had. She is a little anxious but believes she is doing a little better.  ROS: Denies CP, SOB, n/v/d.   Objective: Vital Signs: Blood pressure 145/87, pulse 86, temperature 97.8 F (36.6 C), temperature source Oral, resp. rate 16, weight 62.1 kg (136 lb 14.5 oz), SpO2 97 %. Dg Chest 2 View  07/22/2015  CLINICAL DATA:  Pneumonia, coughing for 2 weeks EXAM: CHEST  2 VIEW COMPARISON:  07/19/2015 FINDINGS: Cardiomegaly again noted. Dextroscoliosis thoracic spine. No pulmonary edema. There is bilateral small pleural effusion with bilateral basilar atelectasis or infiltrate right greater than left. IMPRESSION: No pulmonary edema. Cardiomegaly. Thoracic dextroscoliosis. Persistent bilateral small pleural effusion with bilateral basilar atelectasis or infiltrate right greater than left. Electronically Signed   By: Lahoma Crocker M.D.   On: 07/22/2015 13:36    Recent Labs  07/23/15 0919 07/24/15 0847  WBC 17.3* 20.9*  HGB 15.4* 14.8  HCT 46.4* 45.3  PLT 276 252    Recent Labs  07/23/15 0919  NA 140  K 4.0  CL 102  GLUCOSE 108*  BUN 28*  CREATININE 0.95  CALCIUM 8.5*   CBG (last 3)  No results for input(s): GLUCAP in the last 72 hours.  Wt Readings from Last 3 Encounters:  07/23/15 62.1 kg (136 lb 14.5 oz)  07/20/15 72.122 kg (159 lb)  05/30/15 64.864 kg (143 lb)    Physical Exam:  BP 145/87 mmHg  Pulse 86  Temp(Src) 97.8 F (36.6 C) (Oral)  Resp 16  Wt 62.1 kg (136 lb 14.5 oz)  SpO2 97% Constitutional:  No distress. Frail appearing. Vital signs reviewed HENT: Negative JVD. Normocephalic. Atraumatic Eyes: Conjunctivae and EOM are normal.  Cardiovascular: Irregular irregular with systolic murmur Respiratory:  No respiratory distress. Unlabored breathing. GI:  Soft. Bowel sounds are normal. She exhibits no distension.  MSK: 1+ right pretibial edema. No tenderness Neurological: She is alert and oriented.   Motor: B/l UE 4/5 proximal to distal B/l LE 4+/5 proximal to distal  Skin: Skin is warm and dry.  Ecchymosis dorsal right hand, Ecchymosis right anterior and medial leg Psychiatric: Her behavior is anxious. Judgment normal.   Assessment/Plan: 1. Functional deficits secondary to sepsis/pneumonia which require 3+ hours per day of interdisciplinary therapy in a comprehensive inpatient rehab setting. Physiatrist is providing close team supervision and 24 hour management of active medical problems listed below. Physiatrist and rehab team continue to assess barriers to discharge/monitor patient progress toward functional and medical goals.  Function:  Bathing Bathing position   Position: Wheelchair/chair at sink  Bathing parts Body parts bathed by patient: Right arm, Left arm, Chest, Abdomen, Front perineal area Body parts bathed by helper: Buttocks  Bathing assist Assist Level: Touching or steadying assistance(Pt > 75%)      Upper Body Dressing/Undressing Upper body dressing   What is the patient wearing?: Pull over shirt/dress Bra - Perfomed by patient: Thread/unthread right bra strap, Thread/unthread left bra strap Bra - Perfomed by helper: Hook/unhook bra (pull down sports bra) Pull over shirt/dress - Perfomed by patient: Thread/unthread right sleeve, Thread/unthread left sleeve, Pull shirt over trunk Pull over shirt/dress - Perfomed by helper: Put head through opening        Upper body assist Assist Level: Touching or steadying assistance(Pt > 75%)  Lower Body Dressing/Undressing Lower body dressing   What is the patient wearing?: Underwear, Pants, Non-skid slipper socks, Ted Hose Underwear - Performed by patient: Pull underwear up/down Underwear - Performed by helper: Thread/unthread right underwear leg, Thread/unthread left  underwear leg Pants- Performed by patient: Pull pants up/down Pants- Performed by helper: Thread/unthread right pants leg, Thread/unthread left pants leg   Non-skid slipper socks- Performed by helper: Don/doff right sock, Don/doff left sock                  Lower body assist Assist for lower body dressing: Touching or steadying assistance (Pt > 75%)      Toileting Toileting   Toileting steps completed by patient: Performs perineal hygiene Toileting steps completed by helper: Adjust clothing prior to toileting, Adjust clothing after toileting, Performs perineal hygiene Toileting Assistive Devices: Grab bar or rail  Toileting assist Assist level: Touching or steadying assistance (Pt.75%)   Transfers Chair/bed transfer   Chair/bed transfer method: Stand pivot Chair/bed transfer assist level: Touching or steadying assistance (Pt > 75%) Chair/bed transfer assistive device: Armrests, Bedrails     Locomotion Ambulation     Max distance: 100' Assist level: Touching or steadying assistance (Pt > 75%)   Wheelchair   Type: Manual Max wheelchair distance: 200 Assist Level: Supervision or verbal cues  Cognition Comprehension Comprehension assist level: Follows basic conversation/direction with extra time/assistive device  Expression Expression assist level: Expresses complex 90% of the time/cues < 10% of the time  Social Interaction Social Interaction assist level: Interacts appropriately with others with medication or extra time (anti-anxiety, antidepressant).  Problem Solving Problem solving assist level: Solves complex 90% of the time/cues < 10% of the time  Memory Memory assist level: Recognizes or recalls 90% of the time/requires cueing < 10% of the time    Medical Problem List and Plan: 1. Debilitation/acute respiratory failure secondary to sepsis/pneumonia. Continue nebulizer as directed.   Continue CIR 2. DVT Prophylaxis/Anticoagulation: Eliquis to continue 3. Pain  Management: Tylenol 4. Mood: Zoloft 50 mg daily. Provide emotional support 5. Neuropsych: This patient is capable of making decisions on her own behalf. 6. Skin/Wound Care: Routine skin checks 7. Fluids/Electrolytes/Nutrition: Routine I&O's  Hypokalemia: 4.0 on 1/22  Labs pending for today 8. Atrial fibrillation. Cardiac rate control. Continue Cardizem 180 mg daily 9. Leukocytosis/pneumonia  -zosyn stopped now on augmentin  -prednisone taper   -CXR 1/22 indicating persistent small pleural effusions   -WBCs 20.9 on 1/23, possibly secondary to steroids, no fevers chills   -Will continue to monitor 10. AKI  Cr. .95 on 1/22, improving.   Will encourage fluid intake and consider IVF if necessary  Labs pending on 1/23 11. Cough  Encourage patient to use acappella   We'll continue to monitor     LOS (Days) 4 A FACE TO FACE EVALUATION WAS PERFORMED  Loralee Weitzman Lorie Phenix 07/24/2015 9:29 AM

## 2015-07-24 NOTE — Progress Notes (Signed)
Physical Therapy Session Note  Patient Details  Name: Caroline Myers MRN: [id number] Date of Birth: [birth year]  Today's Date: 07/24/2015 PT Group Time: 1330-1445 PT Group Time Calculation (min): 75 min  Short Term Goals: Week 1:  PT Short Term Goal 1 (Week 1): = LTGs due to short LOS at supervision level overall  Skilled Therapeutic Interventions/Progress Updates:    Pt received seated in w/c, no c/o pain and agreeable to treatment. Performed bathroom transfer with minA. Participated in walking group with focus on dynamic balance, endurance, safety with ambulation. Pt performed 4 x 100', and 1 x 150' with HHA and minA. Significant staggering when walking, worse when turning around corners, and relatively improved when pt cued to focus on point on the wall in front of her. During breaks, pt stood while playing card game with other group members for standing endurance. Pt returned to room totalA for energy conservation, remained seated in w/c at completion of session all needs within reach.   Therapy Documentation Precautions:  Precautions Precautions: Fall Precaution Comments: watch O2 Restrictions Weight Bearing Restrictions: No Pain: Pain Assessment Pain Assessment: No/denies pain Pain Score: 0-No pain   See Function Navigator for Current Functional Status.   Therapy/Group: Group Therapy  Luberta Mutter 07/24/2015, 4:11 PM

## 2015-07-25 ENCOUNTER — Inpatient Hospital Stay (HOSPITAL_COMMUNITY): Payer: Medicare Other

## 2015-07-25 ENCOUNTER — Inpatient Hospital Stay (HOSPITAL_COMMUNITY): Payer: Medicare Other | Admitting: Physical Therapy

## 2015-07-25 ENCOUNTER — Inpatient Hospital Stay (HOSPITAL_COMMUNITY): Payer: Medicare Other | Admitting: Occupational Therapy

## 2015-07-25 MED ORDER — WHITE PETROLATUM GEL
1.0000 "application " | Freq: Three times a day (TID) | Status: DC
  Administered 2015-07-25 – 2015-07-28 (×9): 1 via TOPICAL
  Filled 2015-07-25 (×2): qty 1

## 2015-07-25 MED ORDER — AYR SALINE NASAL NA GEL: 1.0000 "application " | Freq: Three times a day (TID) | NASAL | Status: DC

## 2015-07-25 NOTE — Progress Notes (Signed)
[place] PHYSICAL MEDICINE & REHABILITATION     PROGRESS NOTE  Subjective/Complaints:  Patient states she slept the best that she is slept a long time and notes that she doesn't even feel like she will do finger all night.  ROS: Denies cough, CP, SOB, n/v/d.   Objective: Vital Signs: Blood pressure 109/65, pulse 77, temperature 97.9 F (36.6 C), temperature source Oral, resp. rate 17, weight 62.1 kg (136 lb 14.5 oz), SpO2 99 %. No results found.  Recent Labs  07/23/15 0919 07/24/15 0847  WBC 17.3* 20.9*  HGB 15.4* 14.8  HCT 46.4* 45.3  PLT 276 252    Recent Labs  07/23/15 0919 07/24/15 0847  NA 140 141  K 4.0 4.3  CL 102 100*  GLUCOSE 108* 131*  BUN 28* 28*  CREATININE 0.95 0.96  CALCIUM 8.5* 8.7*   CBG (last 3)  No results for input(s): GLUCAP in the last 72 hours.  Wt Readings from Last 3 Encounters:  07/23/15 62.1 kg (136 lb 14.5 oz)  07/20/15 72.122 kg (159 lb)  05/30/15 64.864 kg (143 lb)    Physical Exam:  BP 109/65 mmHg  Pulse 77  Temp(Src) 97.9 F (36.6 C) (Oral)  Resp 17  Wt 62.1 kg (136 lb 14.5 oz)  SpO2 99% Constitutional:  No distress. Frail appearing. Vital signs reviewed HENT: Negative JVD. Normocephalic. Atraumatic Eyes: Conjunctivae and EOM are normal.  Cardiovascular: Irregular irregular with systolic murmur Respiratory:  No respiratory distress. Unlabored breathing. GI: Soft. Bowel sounds are normal. She exhibits no distension.  MSK: 1+ right pretibial edema. No tenderness Neurological: She is alert and oriented.   Motor: B/l UE 4/5 proximal to distal B/l LE 4+/5 proximal to distal  Skin: Skin is warm and dry.  Ecchymosis dorsal right hand, Ecchymosis right anterior and medial leg (improving) Psychiatric: Her behavior is anxious (better today). Judgment normal.   Assessment/Plan: 1. Functional deficits secondary to sepsis/pneumonia which require 3+ hours per day of interdisciplinary therapy in a comprehensive inpatient rehab  setting. Physiatrist is providing close team supervision and 24 hour management of active medical problems listed below. Physiatrist and rehab team continue to assess barriers to discharge/monitor patient progress toward functional and medical goals.  Function:  Bathing Bathing position   Position: Wheelchair/chair at sink  Bathing parts Body parts bathed by patient: Right arm, Left arm, Chest, Abdomen, Front perineal area Body parts bathed by helper: Buttocks  Bathing assist Assist Level: Touching or steadying assistance(Pt > 75%)      Upper Body Dressing/Undressing Upper body dressing   What is the patient wearing?: Pull over shirt/dress Bra - Perfomed by patient: Thread/unthread right bra strap, Thread/unthread left bra strap Bra - Perfomed by helper: Hook/unhook bra (pull down sports bra) Pull over shirt/dress - Perfomed by patient: Thread/unthread right sleeve, Thread/unthread left sleeve, Pull shirt over trunk Pull over shirt/dress - Perfomed by helper: Put head through opening        Upper body assist Assist Level: Touching or steadying assistance(Pt > 75%)      Lower Body Dressing/Undressing Lower body dressing   What is the patient wearing?: Underwear, Pants, Non-skid slipper socks, Ted Hose Underwear - Performed by patient: Pull underwear up/down Underwear - Performed by helper: Thread/unthread right underwear leg, Thread/unthread left underwear leg Pants- Performed by patient: Pull pants up/down Pants- Performed by helper: Thread/unthread right pants leg, Thread/unthread left pants leg   Non-skid slipper socks- Performed by helper: Don/doff right sock, Don/doff left sock  Lower body assist Assist for lower body dressing: Touching or steadying assistance (Pt > 75%)      Toileting Toileting   Toileting steps completed by patient: Adjust clothing prior to toileting, Performs perineal hygiene, Adjust clothing after toileting Toileting steps  completed by helper: Adjust clothing prior to toileting, Adjust clothing after toileting, Performs perineal hygiene Toileting Assistive Devices: Grab bar or rail  Toileting assist Assist level: Touching or steadying assistance (Pt.75%)   Transfers Chair/bed transfer   Chair/bed transfer method: Stand pivot Chair/bed transfer assist level: Touching or steadying assistance (Pt > 75%) Chair/bed transfer assistive device: Armrests     Locomotion Ambulation     Max distance: 150 Assist level: Touching or steadying assistance (Pt > 75%)   Wheelchair   Type: Manual Max wheelchair distance: 200 Assist Level: Supervision or verbal cues  Cognition Comprehension Comprehension assist level: Follows basic conversation/direction with extra time/assistive device  Expression Expression assist level: Expresses complex 90% of the time/cues < 10% of the time  Social Interaction Social Interaction assist level: Interacts appropriately with others with medication or extra time (anti-anxiety, antidepressant).  Problem Solving Problem solving assist level: Solves complex 90% of the time/cues < 10% of the time  Memory Memory assist level: Recognizes or recalls 90% of the time/requires cueing < 10% of the time    Medical Problem List and Plan: 1. Debilitation/acute respiratory failure secondary to sepsis/pneumonia. Continue nebulizer as directed.   Continue CIR 2. DVT Prophylaxis/Anticoagulation: Eliquis to continue 3. Pain Management: Tylenol 4. Mood: Zoloft 50 mg daily. Provide emotional support 5. Neuropsych: This patient is capable of making decisions on her own behalf. 6. Skin/Wound Care: Routine skin checks 7. Fluids/Electrolytes/Nutrition: Routine I&O's  Hypokalemia: Resolved, 4.3 on 1/23  8. Atrial fibrillation. Cardiac rate control. Continue Cardizem 180 mg daily 9. Leukocytosis/pneumonia  -zosyn stopped now on augmentin until 1/27  -prednisone taper   -CXR 1/22 indicating persistent  small pleural effusions   -WBCs 20.9 on 1/23, possibly secondary to steroids, no fevers chills   -Will continue to monitor 10. AKI  Cr. .96 on 1/23, stable   Will encourage fluid intake and consider IVF if necessary 11. Cough: Improved  Encourage patient to use acappella   Will continue to monitor    LOS (Days) 5 A FACE TO FACE EVALUATION WAS PERFORMED  Caroline Myers 07/25/2015 9:23 AM

## 2015-07-25 NOTE — Progress Notes (Signed)
Physical Therapy Session Note  Patient Details  Name: Caroline Myers MRN: [id number] Date of Birth: 16-Apr-1924  Today's Date: 07/25/2015 PT Individual Time: 1550-1615 PT Individual Time Calculation (min): 25 min   Short Term Goals: Week 1:  PT Short Term Goal 1 (Week 1): = LTGs due to short LOS at supervision level overall  Skilled Therapeutic Interventions/Progress Updates:   Patient sitting in wheelchair on 2 L 02 via [place] with husband present. Patient requested to use bathroom, ambulated in room with min A overall without device, performed toileting tasks with supervision and stood at sink for hand hygiene with min guard. Patient ambulated 2 x 100 ft without AD with min A > min guard with PT managing 02 tank, maintained on 1 L 02 via [place] with Sp02 monitored throughout session between 86-97%. Patient required seated rest break in day room with cues for pursed lip breathing before returning to room. Patient reported, "That's the first time I've felt like myself walking!" and left sitting in wheelchair with needs within reach and husband present.  Therapy Documentation Precautions:  Precautions Precautions: Fall Precaution Comments: watch O2 Restrictions Weight Bearing Restrictions: No Pain: Pain Assessment Pain Assessment: No/denies pain  See Function Navigator for Current Functional Status.   Therapy/Group: Individual Therapy  Laretta Alstrom 07/25/2015, 4:37 PM

## 2015-07-25 NOTE — Progress Notes (Signed)
Occupational Therapy Session Note  Patient Details  Name: Caroline Myers MRN: [id number] Date of Birth: [birth year]  Today's Date: 07/25/2015 OT Individual Time: [id number] OT Individual Time Calculation (min): 60 min    Short Term Goals: Week 1:  OT Short Term Goal 1 (Week 1): STG=LTG d/t short LOS  Skilled Therapeutic Interventions/Progress Updates: ADL-retraining with focus on improved sequencing, problem-solving, attention, activity tolerance, and dynamic standing balance.   Pt received seated at EOB eating her breakfast but complaining about menu.   OT instructs pt to call food service and correct order for next day with mod assist to problem-solve.   Pt agreeable to bathing/dressing at shower level and performs functional mobility to bathroom with min guard assist during mobility and vc to position RW at toilet and tub bench during transfer.  Pt performs shower-level bathing with min vc to sequence d/t confusion with impaired attention/memory.   Pt recovers to w/c and dressing at sink after rest break from showering.   Pt requires min-mod assist to dress lower body, setup to apply TEDs and steadying assist while standing to pull up underwear and pants.     Therapy Documentation Precautions:  Precautions Precautions: Fall Precaution Comments: watch O2 Restrictions Weight Bearing Restrictions: No  Vital Signs: Therapy Vitals Temp: 97.6 F (36.4 C) Temp Source: Oral Pulse Rate: 70 Resp: 16 BP: 99/67 mmHg Patient Position (if appropriate): Sitting Oxygen Therapy SpO2: 96 % O2 Device: Nasal Cannula O2 Flow Rate (L/min): 2 L/min   Pain: No/denies pain   ADL: ADL ADL Comments: see Functional Assessment Tool  See Function Navigator for Current Functional Status.   Therapy/Group: Individual Therapy  [place] 07/25/2015, 3:47 PM

## 2015-07-25 NOTE — Progress Notes (Signed)
Occupational Therapy Session Note  Patient Details  Name: Caroline Myers MRN: [id number] Date of Birth: 01/12/1924  Today's Date: 07/25/2015 OT Individual Time: [id number] OT Individual Time Calculation (min): 60 min    Short Term Goals: Week 1:  OT Short Term Goal 1 (Week 1): STG=LTG d/t short LOS  Skilled Therapeutic Interventions/Progress Updates:    Pt seen for skilled OT to facilitate functional mobility and activity tolerance related to the skills she needs for self care. Pt reviewed her prior OT session, stating where she needed assist. Her main concern was cleansing after toileting and the difficulty she has due to limited reach and balance and her desire to wean off the O2. Pt taken to the gym to work on standing balance activities. Her O2 was on 2L, after working for 15 minutes she was at 100% sat rate. O2 reduced to 1L and pt continued to work on various standing balance exercises including mini squats, knee raises, heel raises, alternating toe taps all with UE support on parallel bar.  Her O2 sats were at 97-98%.  Pt tolerated standing for up to 8 minutes at a time and talking during the exercises with no drop in O2 levels. Pt resting in w/c for her next PT session.  Therapy Documentation Precautions:  Precautions Precautions: Fall Precaution Comments: watch O2 Restrictions Weight Bearing Restrictions: No    Vital Signs: Therapy Vitals Temp: 97.6 F (36.4 C) Temp Source: Oral Pulse Rate: 70 Resp: 16 BP: 99/67 mmHg Patient Position (if appropriate): Sitting Oxygen Therapy SpO2: 98 % O2 Device: Nasal Cannula O2 Flow Rate (L/min): 1 L/min Pulse Oximetry Type: Intermittent Pain: Pain Assessment Pain Assessment: No/denies pain ADL: ADL ADL Comments: see Functional Assessment Tool  See Function Navigator for Current Functional Status.   Therapy/Group: Individual Therapy  SAGUIER,JULIA 07/25/2015, 2:09 PM

## 2015-07-25 NOTE — Progress Notes (Signed)
Physical Therapy Session Note  Patient Details  Name: Caroline Myers MRN: [id number] Date of Birth: [birth year]  Today's Date: 07/25/2015 PT Individual Time: 1115-1200 PT Individual Time Calculation (min): 45 min   Short Term Goals: Week 1:  PT Short Term Goal 1 (Week 1): = LTGs due to short LOS at supervision level overall  Skilled Therapeutic Interventions/Progress Updates:      Therapy Documentation Precautions:  Precautions Precautions: Fall Precaution Comments: watch O2 Restrictions Weight Bearing Restrictions: No  Patient received in gym and handed off directly from [place]. Patient remained on 1LO2 via nasal cannula. Patient oxygen level never dropped below 97% after activity.   Sit to and from stand transfer close supervision  Patient ambulated with and without RW. Patient ambulated with hand held assist 200 feet with min assist overall. Patient ambulated with an uneven step and stride length, verbal cues for improved step length and pacing. Patient displayed increased postural sway. Patient ambulated with RW close supervision with incidental min assist for RW management. Verbal cues throughout for RW management. Patient did demonstrate improved step length and posture overall with RW.  Patient up and down 12 steps with bilateral handrail and a reciprocal pattern min assist. Patient educated on proper sequence and technique.   Standing there ex: B heel raises 3x10 Mini squats 3x10  Patient performed standing alternating cone taps for approximately 4 minutes with min to max excursion requiring up to max assistance for balance. Focusing on single leg stance, coordination and endurance.   Patient returned to room at end of session with all needs met and was without complaints.     See Function Navigator for Current Functional Status.   Therapy/Group: Individual Therapy  Retta Diones 07/25/2015, 3:12 PM

## 2015-07-26 ENCOUNTER — Inpatient Hospital Stay (HOSPITAL_COMMUNITY): Payer: Medicare Other | Admitting: Occupational Therapy

## 2015-07-26 ENCOUNTER — Inpatient Hospital Stay (HOSPITAL_COMMUNITY): Payer: Medicare Other | Admitting: Physical Therapy

## 2015-07-26 MED ORDER — TRAZODONE HCL 50 MG PO TABS
50.0000 mg | ORAL_TABLET | Freq: Every evening | ORAL | Status: DC | PRN
  Administered 2015-07-26 – 2015-07-28 (×3): 50 mg via ORAL
  Filled 2015-07-26 (×3): qty 1

## 2015-07-26 NOTE — Progress Notes (Signed)
Physical Therapy Session Note  Patient Details  Name: Caroline Myers MRN: [id number] Date of Birth: 1924/05/26  Today's Date: 07/26/2015 PT Individual Time: 1010-1103 PT Individual Time Calculation (min): 53 min   Short Term Goals: Week 1:  PT Short Term Goal 1 (Week 1): = LTGs due to short LOS at supervision level overall  Skilled Therapeutic Interventions/Progress Updates:   Received in recliner; discussed with pt use of rollator vs. No AD and recommendation for use of AD for balance, endurance and energy conservation.  Performed transfer from recliner with supervision, toileting tasks with supervision-min A for balance and min HHA to ambulate to/from toilet without AD.    Performed 2 minute walk test of endurance to assess effectiveness of RW for balance, activity tolerance and endurance:  2 minute walk test with rollator: 244 ft, vitals: 98% Sp02, 84 bpm 1+ on dyspnea scale and supervision  2 minute walk test without rollator: 219 ft, vitals: 100% Sp02, 87 bpm 2+ on dyspnea scale with min A at all times for balance during gait.    Recommending use of RW or rollator for gait in controlled and community environment; will continue to assess need for AD in home environment.  Performed one step negotiation training with rollator to simulate home entry/exit with min A and max verbal and visual cues for safe sequence.  Returned to room and pt left in recliner with respiratory present to administer breathing treatment before OT session.    Therapy Documentation Precautions:  Precautions Precautions: Fall Precaution Comments: watch O2 Restrictions Weight Bearing Restrictions: No Vital Signs: Therapy Vitals Pulse Rate: 84 Oxygen Therapy SpO2: 98 % O2 Device: Nasal Cannula O2 Flow Rate (L/min): 2 L/min Pulse Oximetry Type: Intermittent Pain: Pain Assessment Pain Assessment: No/denies pain Pain Score: 0-No pain    See Function Navigator for Current Functional  Status.   Therapy/Group: Individual Therapy  Raylene Everts [facility] 07/26/2015, 12:21 PM

## 2015-07-26 NOTE — Progress Notes (Signed)
Notified Dan Anguilli, PA with pt's BP. 81/52 HR 71. No new orders at this time. Will continue to monitor and follow up following lab work. Encouraging pt to drink more fluids at this time.

## 2015-07-26 NOTE — Progress Notes (Signed)
Social Work Patient ID: Adelene Idler, female   DOB: [birth year], 82 y.o.   MRN: [id number] Met with pt, husband and daughter to discuss team conference goals-supervision level and discharge 1/28. All were very concerned about the discharge and feel she is no where near ready to leave the Hospital. Discussed how well she is doing in therapies and that they needed to come in and see her in them. Discussed options of hiring assistance and short term NHP. Daughter voiced all of her health issues and her inability to Care for her parents.  Informed her the team was aware of her not being able to assist them at home. Discussed their ability to appeal the Medicare process if they did not feel she is ready to be discharged from the Hospital. They have decided to come in tomorrow at her 3:15 PT session to see how she is doing. Will see them tomorrow and ask MD if any medical issues would keep her in the hospital. Work on discharge plan all are comfortable with.

## 2015-07-26 NOTE — Progress Notes (Signed)
[facility] PHYSICAL MEDICINE & REHABILITATION     PROGRESS NOTE  Subjective/Complaints:  Pt did not sleep well overnight because she had a dream about taking medications and was not sure if it was real and then was anxious about having missed her medications.    ROS: Denies cough, CP, SOB, n/v/d.   Objective: Vital Signs: Blood pressure 111/63, pulse 68, temperature 98.4 F (36.9 C), temperature source Oral, resp. rate 18, weight 62.1 kg (136 lb 14.5 oz), SpO2 96 %. No results found.  Recent Labs  07/23/15 0919 07/24/15 0847  WBC 17.3* 20.9*  HGB 15.4* 14.8  HCT 46.4* 45.3  PLT 276 252    Recent Labs  07/23/15 0919 07/24/15 0847  NA 140 141  K 4.0 4.3  CL 102 100*  GLUCOSE 108* 131*  BUN 28* 28*  CREATININE 0.95 0.96  CALCIUM 8.5* 8.7*   CBG (last 3)  No results for input(s): GLUCAP in the last 72 hours.  Wt Readings from Last 3 Encounters:  07/23/15 62.1 kg (136 lb 14.5 oz)  07/20/15 72.122 kg (159 lb)  05/30/15 64.864 kg (143 lb)    Physical Exam:  BP 111/63 mmHg  Pulse 68  Temp(Src) 98.4 F (36.9 C) (Oral)  Resp 18  Wt 62.1 kg (136 lb 14.5 oz)  SpO2 96% Constitutional:  No distress. Frail appearing. Vital signs reviewed HENT: Negative JVD. Normocephalic. Atraumatic Eyes: Conjunctivae and EOM are normal.  Cardiovascular: Irregular irregular with systolic murmur Respiratory:  +[place]. No respiratory distress. Unlabored breathing. GI: Soft. Bowel sounds are normal. She exhibits no distension.  MSK: 1+ right pretibial edema. No tenderness Neurological: She is alert and oriented.   Motor: B/l UE 4+/5 proximal to distal B/l LE 4+/5 proximal to distal  Skin: Skin is warm and dry.  Ecchymosis dorsal right hand, Ecchymosis right anterior and medial leg (improving) Psychiatric: Her behavior is anxious. Judgment normal.   Assessment/Plan: 1. Functional deficits secondary to sepsis/pneumonia which require 3+ hours per day of interdisciplinary therapy in a  comprehensive inpatient rehab setting. Physiatrist is providing close team supervision and 24 hour management of active medical problems listed below. Physiatrist and rehab team continue to assess barriers to discharge/monitor patient progress toward functional and medical goals.  Function:  Bathing Bathing position   Position: Shower  Bathing parts Body parts bathed by patient: Right arm, Left arm, Chest, Abdomen, Front perineal area, Right upper leg, Left upper leg, Right lower leg, Left lower leg Body parts bathed by helper: Buttocks, Back  Bathing assist Assist Level: Touching or steadying assistance(Pt > 75%)      Upper Body Dressing/Undressing Upper body dressing   What is the patient wearing?: Pull over shirt/dress Bra - Perfomed by patient: Thread/unthread right bra strap, Thread/unthread left bra strap Bra - Perfomed by helper: Hook/unhook bra (pull down sports bra) Pull over shirt/dress - Perfomed by patient: Thread/unthread right sleeve, Thread/unthread left sleeve Pull over shirt/dress - Perfomed by helper: Put head through opening, Pull shirt over trunk        Upper body assist Assist Level: Touching or steadying assistance(Pt > 75%)      Lower Body Dressing/Undressing Lower body dressing   What is the patient wearing?: Underwear, Pants, Non-skid slipper socks, Ted Hose Underwear - Performed by patient: Pull underwear up/down Underwear - Performed by helper: Thread/unthread right underwear leg, Thread/unthread left underwear leg Pants- Performed by patient: Thread/unthread right pants leg, Thread/unthread left pants leg, Pull pants up/down Pants- Performed by helper: Thread/unthread right  pants leg, Thread/unthread left pants leg   Non-skid slipper socks- Performed by helper: Don/doff right sock, Don/doff left sock               TED Hose - Performed by helper: Don/doff right TED hose, Don/doff left TED hose  Lower body assist Assist for lower body dressing:  Touching or steadying assistance (Pt > 75%)      Toileting Toileting   Toileting steps completed by patient: Adjust clothing prior to toileting, Performs perineal hygiene, Adjust clothing after toileting Toileting steps completed by helper: Adjust clothing prior to toileting, Adjust clothing after toileting, Performs perineal hygiene Toileting Assistive Devices: Grab bar or rail  Toileting assist Assist level: Supervision or verbal cues   Transfers Chair/bed transfer   Chair/bed transfer method: Ambulatory Chair/bed transfer assist level: Touching or steadying assistance (Pt > 75%) Chair/bed transfer assistive device: Armrests     Locomotion Ambulation     Max distance: 150 Assist level: Touching or steadying assistance (Pt > 75%)   Wheelchair   Type: Manual Max wheelchair distance: 200 Assist Level: Supervision or verbal cues  Cognition Comprehension Comprehension assist level: Follows basic conversation/direction with extra time/assistive device  Expression Expression assist level: Expresses complex 90% of the time/cues < 10% of the time  Social Interaction Social Interaction assist level: Interacts appropriately with others - No medications needed.  Problem Solving Problem solving assist level: Solves complex 90% of the time/cues < 10% of the time  Memory Memory assist level: Recognizes or recalls 90% of the time/requires cueing < 10% of the time    Medical Problem List and Plan: 1. Debilitation/acute respiratory failure secondary to sepsis/pneumonia. Continue nebulizer as directed.   Continue CIR 2. DVT Prophylaxis/Anticoagulation: Eliquis to continue 3. Pain Management: Tylenol 4. Mood: Zoloft 50 mg daily. Provide emotional support 5. Neuropsych: This patient is capable of making decisions on her own behalf. 6. Skin/Wound Care: Routine skin checks 7. Fluids/Electrolytes/Nutrition: Routine I&O's  Hypokalemia: Resolved, 4.3 on 1/23  8. Atrial fibrillation. Cardiac  rate control. Continue Cardizem 180 mg daily 9. Leukocytosis/pneumonia  -zosyn stopped now on augmentin until 1/27  -prednisone taper   -CXR 1/22 indicating persistent small pleural effusions   -WBCs 20.9 on 1/23, possibly secondary to steroids, no fevers chills   -Labs ordered for tomorrow 10. AKI  Cr. .96 on 1/23, stable   Labs ordered for tomorrow  Will encourage fluid intake and consider IVF if necessary 11. Cough: Improved  Encourage patient to use acappella   Will continue to monitor    LOS (Days) 6 A FACE TO FACE EVALUATION WAS PERFORMED  Cortney Beissel Lorie Phenix 07/26/2015 8:51 AM

## 2015-07-26 NOTE — Progress Notes (Signed)
Occupational Therapy Session Note  Patient Details  Name: Caroline Myers MRN: [id number] Date of Birth: 22-Feb-1924  Today's Date: 07/26/2015 OT Individual Time: 0800-0900 OT Individual Time Calculation (min): 60 min    Short Term Goals: Week 1:  OT Short Term Goal 1 (Week 1): STG=LTG d/t short LOS  Skilled Therapeutic Interventions/Progress Updates:    1:1 Pt sitting on EOB when arrived finishing breakfast. Focus on activity tolerance/ cardiovascular endurance while performing basic ADLs and dynamic standing balance. Pt ambulated to bathroom on 1 lit of O2 with HHA and performed toilet transfer, toileting and returned to room and picked out clothing from dresser in standing. Pt desated to 79 % and required one minute to recover into 90s. Pt continued to desat with activity requiring frequent rest breaks on 1 lit of O2.  Bumped her up to 1 1/2 liters on wall unit.  Pt reports thinking is slow now but relates her cognition to her previous BI and CVA. After donning underwear and pants pt with increased fatigue and 3/4 dyspnea requiring a prolonged break to return to >90%.  Bumped O2 up to 2 liters and returned to 94 liter after 2 min. Pt with c/o of irritation at top of ears from O2 tubing- RN came in to place padding to nose and bilateral ears for protection. Pt required more than reasonable amt of time to don shoes.  Practiced functional ambulation with Rollator for energy conservation.  Ambulated to RN station on one liter of O2 - pt desated to 84% with seated break on Rollator. REturn to rest in recliner with LE elevated for edema control.   Therapy Documentation Precautions:  Precautions Precautions: Fall Precaution Comments: watch O2 Restrictions Weight Bearing Restrictions: No Pain:  no c/o pain - " I feel where I worked out yesterday" ADL: ADL ADL Comments: see Functional Assessment Tool  See Function Navigator for Current Functional Status.   Therapy/Group: Individual  Therapy  Willeen Cass [facility] 07/26/2015, 8:20 AM

## 2015-07-26 NOTE — Patient Care Conference (Signed)
Inpatient RehabilitationTeam Conference and Plan of Care Update Date: 07/26/2015   Time: 2:00 PM    Patient Name: Caroline Myers      Medical Record Number: [id number]  Date of Birth: 11-07-1923 Sex: Female         Room/Bed: 4W26C/4W26C-01 Payor Info: Payor: MEDICARE / Plan: MEDICARE PART A AND B / Product Type: *No Product type* /    Admitting Diagnosis: DEBILITY SEPSIS ACUTE RESP FAILURE  Admit Date/Time:  07/20/2015  2:31 PM Admission Comments: No comment available   Primary Diagnosis:  Debility Principal Problem: Debility  Patient Active Problem List   Diagnosis Date Noted  . Cough   . Hypokalemia   . AKI (acute kidney injury) ([place])   . Acute diastolic congestive heart failure ([place]) 07/20/2015  . Debility 07/20/2015  . Sepsis ([place]) 07/20/2015  . Acute on chronic diastolic congestive heart failure ([place])   . History of CVA (cerebrovascular accident)   . Tachypnea   . Leukocytosis 07/17/2015  . Arterial hypotension   . Hypotension 07/16/2015  . Elevated troponin 07/16/2015  . PNA (pneumonia)   . Severe sepsis ([place]) 07/15/2015  . Acute respiratory failure with hypercapnia ([place]) 07/15/2015  . Chronic anticoagulation 04/18/2015  . Orthostatic hypotension 03/09/2015  . CVA (cerebral infarction) 02/14/2015  . Stroke ([place]) 02/14/2015  . Chronic atrial fibrillation ([place]) 02/14/2015  . Stroke with cerebral ischemia ([place])   . Cerebral infarction due to embolism of left middle cerebral artery ([place])   . Occipital fracture ([place])   . Benign essential HTN   . Other allergic rhinitis   . Depression   . SAH (subarachnoid hemorrhage) ([place]) 01/02/2015  . SDH (subdural hematoma) ([place]) 01/02/2015  . Aortic atherosclerosis ([place]) 08/05/2014  . Nonspecific abnormal unspecified cardiovascular function study 10/28/2013  . Aortic valve disorder 10/28/2013  . HLD (hyperlipidemia) 07/28/2013  . TIA (transient ischemic attack) 07/27/2013  . History of CHF (congestive heart failure)  07/27/2013  . CVA (cerebral vascular accident) ([place]) 07/27/2013  . Urticaria 11/06/2010  . Bronchitis 10/12/2010  . Preventative health care   . Atrial fibrillation ([place]) 04/04/2010  . Anxiety state 03/29/2010  . ATRIAL FLUTTER 03/29/2010  . RIB PAIN, LEFT SIDED 10/12/2009  . ALLERGIC RHINITIS 06/28/2009  . SHOULDER PAIN, LEFT 06/28/2009  . SCIATICA, RIGHT 06/28/2009  . HEADACHE 06/28/2009  . DYSPNEA ON EXERTION 06/28/2009  . SKIN CANCER, HX OF 06/28/2009    Expected Discharge Date: Expected Discharge Date: 07/29/15  Team Members Present: Physician leading conference: Dr. Delice Lesch Social Worker Present: Ovidio Kin, LCSW Nurse Present: Heather Roberts, RN PT Present: Raylene Everts, PT OT Present: Willeen Cass, OT SLP Present: Windell Moulding, SLP PPS Coordinator present : Daiva Nakayama, RN, CRRN     Current Status/Progress Goal Weekly Team Focus  Medical   Debilitation/acute respiratory failure secondary to sepsis/pneumonia  Improve conditioning, endurance, tx PNA  see above   Bowel/Bladder   pt continent of bowel and bladder. LBM 1-23  min assist  contiune POC with b/b   Swallow/Nutrition/ Hydration     na        ADL's   Min-Mod A for BADL, steadying assist during dynamic standing balance and transfers  Mod I for bathing/dressing/toileting, supervision for bathroom transfers and dynamic standing balance  Memory, attention, activity tolerance, sequencing, safety awareness, dynamic standing balance, adapted bathing/dressing, DME training.   Mobility   Supervision-min A overall  Supervision overall  Activity tolerance/endurance, balance and gait   Communication     na  Safety/Cognition/ Behavioral Observations    no unsafe behaviors        Pain   no complaints of pain         Skin   skin tear to left arm-foam inplace. scattered brusing on extermities, reddness to right nare-protective barrier inpalce  free of skin breakdown min assist  assess skin q shift       *See Care Plan and progress notes for long and short-term goals.  Barriers to Discharge: PNA, leukocytosis, mobility, endurance    Possible Resolutions to Barriers:  Cont abx, monitor labs, cont aggressive therapies    Discharge Planning/Teaching Needs:  Home with husbnand who is limited due to his own health issues but can be there/supervision for pt. Daughter can intermittently check on-would not committ to daily checks      Team Discussion:  Pt has low activity tolerance but is making progress in therapies. Goals-supervision level. Probably need O2 at home her sats drop in therapies. Needs cues at times. Need family to come in and attend therapies with pt. Husband and daughter are not able to physically assist at discharge. Higher level cogintive issues-memory  Revisions to Treatment Plan:  None   Continued Need for Acute Rehabilitation Level of Care: The patient requires daily medical management by a physician with specialized training in physical medicine and rehabilitation for the following conditions: Daily direction of a multidisciplinary physical rehabilitation program to ensure safe treatment while eliciting the highest outcome that is of practical value to the patient.: Yes Daily medical management of patient stability for increased activity during participation in an intensive rehabilitation regime.: Yes Daily analysis of laboratory values and/or radiology reports with any subsequent need for medication adjustment of medical intervention for : Pulmonary problems;Renal problems;Mood/behavior problems  [name] 07/27/2015, 8:29 AM

## 2015-07-26 NOTE — Plan of Care (Signed)
Problem: RH Floor Transfers Goal: LTG Patient will perform floor transfers w/assist (PT) LTG: Patient will perform floor transfers with assistance (PT). Goal added 07/26/15

## 2015-07-26 NOTE — Progress Notes (Signed)
Occupational Therapy Session Note  Patient Details  Name: Caroline Myers MRN: [id number] Date of Birth: July 12, 1923  Today's Date: 07/26/2015 OT Concurrent Time: 1100-1200 OT Concurrent Time Calculation (min): 60 min   Short Term Goals: Week 1:  OT Short Term Goal 1 (Week 1): STG=LTG d/t short LOS  Skilled Therapeutic Interventions/Progress Updates:    Concurrent session with focus on general endurance, activity tolerance, and dynamic standing balance.  Pt ambulated to Dayroom with Rollator and close supervision.  Engaged in 28 piece jigsaw puzzle in standing with focus on sequencing, problem solving, and awareness.  Pt able to verbalize organization of puzzle and typical sequencing but demonstrating difficulty carrying out.  Pt reports frustration and "embarrassement" at how much difficulty she is having with puzzle.  Encouraged pt to take seated rest breaks to assess vitals, O2 dropping to low 80s on 3L O2 requiring increased time and cues for pursed lip breathing.  Returned to standing, therapist providing question cues to increase success with completing puzzle as well as education on seated rest breaks both during this task as well as carry over to additional functional tasks with pt reporting understanding.  Therapy Documentation Precautions:  Precautions Precautions: Fall Precaution Comments: watch O2 Restrictions Weight Bearing Restrictions: No General:   Vital Signs: Therapy Vitals Temp: 97.8 F (36.6 C) Temp Source: Oral Pulse Rate: 78 Resp: 17 BP: (!) 81/52 mmHg Patient Position (if appropriate): Sitting Oxygen Therapy SpO2: 100 % O2 Device: Nasal Cannula O2 Flow Rate (L/min): 2 L/min Pain: Pain Assessment Pain Score: 0-No pain ADL: ADL ADL Comments: see Functional Assessment Tool  See Function Navigator for Current Functional Status.   Therapy/Group: Concurrent Session  Simonne Come 07/26/2015, 3:56 PM

## 2015-07-27 ENCOUNTER — Inpatient Hospital Stay (HOSPITAL_COMMUNITY): Payer: Medicare Other

## 2015-07-27 LAB — CBC WITH DIFFERENTIAL/PLATELET
BASOS PCT: 0 %
Basophils Absolute: 0 10*3/uL (ref 0.0–0.1)
EOS ABS: 0.1 10*3/uL (ref 0.0–0.7)
EOS PCT: 1 %
HCT: 36 % (ref 36.0–46.0)
HEMOGLOBIN: 11.8 g/dL — AB (ref 12.0–15.0)
LYMPHS ABS: 1.1 10*3/uL (ref 0.7–4.0)
Lymphocytes Relative: 8 %
MCH: 33.4 pg (ref 26.0–34.0)
MCHC: 32.8 g/dL (ref 30.0–36.0)
MCV: 102 fL — ABNORMAL HIGH (ref 78.0–100.0)
MONO ABS: 1.6 10*3/uL — AB (ref 0.1–1.0)
MONOS PCT: 11 %
NEUTROS PCT: 80 %
Neutro Abs: 11.6 10*3/uL — ABNORMAL HIGH (ref 1.7–7.7)
Platelets: 200 10*3/uL (ref 150–400)
RBC: 3.53 MIL/uL — ABNORMAL LOW (ref 3.87–5.11)
RDW: 14 % (ref 11.5–15.5)
WBC: 14.4 10*3/uL — ABNORMAL HIGH (ref 4.0–10.5)

## 2015-07-27 LAB — BASIC METABOLIC PANEL
Anion gap: 5 (ref 5–15)
BUN: 25 mg/dL — AB (ref 6–20)
CALCIUM: 8 mg/dL — AB (ref 8.9–10.3)
CHLORIDE: 105 mmol/L (ref 101–111)
CO2: 31 mmol/L (ref 22–32)
CREATININE: 0.87 mg/dL (ref 0.44–1.00)
GFR calc non Af Amer: 57 mL/min — ABNORMAL LOW (ref >60)
Glucose, Bld: 85 mg/dL (ref 65–99)
Potassium: 4.2 mmol/L (ref 3.5–5.1)
SODIUM: 141 mmol/L (ref 135–145)

## 2015-07-27 NOTE — Progress Notes (Addendum)
Physical Therapy Session Note  Patient Details  Name: Caroline Myers MRN: [id number] Date of Birth: [birth year]  Today's Date: 07/27/2015 PT Individual Time: 1003-1103; [id number] PT Individual Time Calculation (min): 60 min, 60 min  Short Term Goals: Week 1:  PT Short Term Goal 1 (Week 1): = LTGs due to short LOS at supervision level overall   Skilled Therapeutic Interventions/Progress Updates:   Tx 1:  pt now trialling room air; pt has open sore R nostril. O2 sats on room air >90% at rest and with activity.  Pt has poor insight into deficits.  She continues to want to not use a walker.  Gait with 4WW x 150' , x 100' with mod cues for brakes, safe use of walker.  Min assist stand> sit as pt flopped with poor eccentric control when fatigued.   Floor transfer with +2 for pt to get to floor mat, with intermediate stool to sit on; pt fearful and frustrated that she has difficulty with this.  Pt c/o vertigo as she lay down with head on pillow.  Pt reported how flexible she was when doing Yoga classes, but this was 5 years ago.  She stated she stores many items in a low cabinet in her BR, and uses a step stool to reach high shelves in kitchen. PT advised pt to rearrange items with [facility] once home.  After resting, pt required mod assist to get up from floor.      Pt returned to room, and left resting in w/c with all needs within reach.   tx 2:  Treating PT spoke with primary PT to resolve safety issues about 4WW.  RW, not 4WW is safer for pt; she already owns one.  Family informed and concurred.  Family ed with dtr [name] and husband.  Husband uses a SPC and used transport chair to come to unit; [name] is awaiting a 2nd THA and ambulates slowly with a limp. [name] trained in simulated car transfer to SUV height seat, gait on level tile and carpet with 2WW. Husband trained in up/down step to enter house, x 2, gait on level tile. O2  sats 90- 100% during session. Family safe to supervise pt. Therapy  Documentation Precautions:  Precautions Precautions: Fall Precaution Comments: watch O2 Restrictions Weight Bearing Restrictions: No   Vital Signs: Therapy Vitals Pulse Rate: 98 Patient Position (if appropriate): Sitting Oxygen Therapy SpO2: 90 % O2 Device: Not Delivered Pulse Oximetry Type: Intermittent Pain: no c/o      See Function Navigator for Current Functional Status.   Therapy/Group: Individual Therapy  Delesha Pohlman 07/27/2015, 12:27 PM

## 2015-07-27 NOTE — Progress Notes (Signed)
Social Work [name], LCSW Social Worker Signed  Patient Care Conference 07/26/2015  3:20 PM    Expand All Collapse All   Inpatient RehabilitationTeam Conference and Plan of Care Update Date: 07/26/2015   Time: 2:00 PM     Patient Name: Caroline Myers       Medical Record Number: [id number]  Date of Birth: [birth year] Sex: Female         Room/Bed: 4W26C/4W26C-01 Payor Info: Payor: MEDICARE / Plan: MEDICARE PART A AND B / Product Type: *No Product type* /    Admitting Diagnosis: DEBILITY SEPSIS ACUTE RESP FAILURE  Admit Date/Time:  07/20/2015  2:31 PM Admission Comments: No comment available   Primary Diagnosis:  Debility Principal Problem: Debility    Patient Active Problem List     Diagnosis  Date Noted   .  Cough     .  Hypokalemia     .  AKI (acute kidney injury) ([place])     .  Acute diastolic congestive heart failure ([place])  07/20/2015   .  Debility  07/20/2015   .  Sepsis ([place])  07/20/2015   .  Acute on chronic diastolic congestive heart failure ([place])     .  History of CVA (cerebrovascular accident)     .  Tachypnea     .  Leukocytosis  07/17/2015   .  Arterial hypotension     .  Hypotension  07/16/2015   .  Elevated troponin  07/16/2015   .  PNA (pneumonia)     .  Severe sepsis ([place])  07/15/2015   .  Acute respiratory failure with hypercapnia ([place])  07/15/2015   .  Chronic anticoagulation  04/18/2015   .  Orthostatic hypotension  03/09/2015   .  CVA (cerebral infarction)  02/14/2015   .  Stroke ([place])  02/14/2015   .  Chronic atrial fibrillation ([place])  02/14/2015   .  Stroke with cerebral ischemia ([place])     .  Cerebral infarction due to embolism of left middle cerebral artery ([place])     .  Occipital fracture ([place])     .  Benign essential HTN     .  Other allergic rhinitis     .  Depression     .  SAH (subarachnoid hemorrhage) ([place])  01/02/2015   .  SDH (subdural hematoma) ([place])  01/02/2015   .  Aortic atherosclerosis ([place])  08/05/2014   .  Nonspecific  abnormal unspecified cardiovascular function study  10/28/2013   .  Aortic valve disorder  10/28/2013   .  HLD (hyperlipidemia)  07/28/2013   .  TIA (transient ischemic attack)  07/27/2013   .  History of CHF (congestive heart failure)  07/27/2013   .  CVA (cerebral vascular accident) ([place])  07/27/2013   .  Urticaria  11/06/2010   .  Bronchitis  10/12/2010   .  Preventative health care     .  Atrial fibrillation ([place])  04/04/2010   .  Anxiety state  03/29/2010   .  ATRIAL FLUTTER  03/29/2010   .  RIB PAIN, LEFT SIDED  10/12/2009   .  ALLERGIC RHINITIS  06/28/2009   .  SHOULDER PAIN, LEFT  06/28/2009   .  SCIATICA, RIGHT  06/28/2009   .  HEADACHE  06/28/2009   .  DYSPNEA ON EXERTION  06/28/2009   .  SKIN CANCER, HX OF  06/28/2009     Expected Discharge Date: Expected Discharge  Date: 07/29/15  Team Members Present: Physician leading conference: Dr. Delice Lesch Social Worker Present: Ovidio Kin, LCSW Nurse Present: Heather Roberts, RN PT Present: Raylene Everts, PT OT Present: Willeen Cass, OT SLP Present: Windell Moulding, SLP PPS Coordinator present : Daiva Nakayama, RN, CRRN        Current Status/Progress  Goal  Weekly Team Focus   Medical     Debilitation/acute respiratory failure secondary to sepsis/pneumonia  Improve conditioning, endurance, tx PNA   see above   Bowel/Bladder     pt continent of bowel and bladder. LBM 1-23   min assist  contiune POC with b/b   Swallow/Nutrition/ Hydration       na         ADL's     Min-Mod A for BADL, steadying assist during dynamic standing balance and transfers  Mod I for bathing/dressing/toileting, supervision for bathroom transfers and dynamic standing balance  Memory, attention, activity tolerance, sequencing, safety awareness, dynamic standing balance, adapted bathing/dressing, DME training.    Mobility     Supervision-min A overall  Supervision overall  Activity tolerance/endurance, balance and gait    Communication       na          Safety/Cognition/ Behavioral Observations      no unsafe behaviors         Pain     no complaints of pain         Skin     skin tear to left arm-foam inplace. scattered brusing on extermities, reddness to right nare-protective barrier inpalce   free of skin breakdown min assist  assess skin q shift      *See Care Plan and progress notes for long and short-term goals.    Barriers to Discharge:  PNA, leukocytosis, mobility, endurance      Possible Resolutions to Barriers:   Cont abx, monitor labs, cont aggressive therapies      Discharge Planning/Teaching Needs:   Home with husbnand who is limited due to his own health issues but can be there/supervision for pt. Daughter can intermittently check on-would not committ to daily checks       Team Discussion:    Pt has low activity tolerance but is making progress in therapies. Goals-supervision level. Probably need O2 at home her sats drop in therapies. Needs cues at times. Need family to come in and attend therapies with pt. Husband and daughter are not able to physically assist at discharge. Higher level cogintive issues-memory   Revisions to Treatment Plan:    None    Continued Need for Acute Rehabilitation Level of Care: The patient requires daily medical management by a physician with specialized training in physical medicine and rehabilitation for the following conditions: Daily direction of a multidisciplinary physical rehabilitation program to ensure safe treatment while eliciting the highest outcome that is of practical value to the patient.: Yes Daily medical management of patient stability for increased activity during participation in an intensive rehabilitation regime.: Yes Daily analysis of laboratory values and/or radiology reports with any subsequent need for medication adjustment of medical intervention for : Pulmonary problems;Renal problems;Mood/behavior problems  [name] 07/27/2015, 8:29 AM                   Patient ID: Caroline Myers, female   DOB: [birth year], 82 y.o.   MRN: [id number]

## 2015-07-27 NOTE — Progress Notes (Signed)
Occupational Therapy Session Note  Patient Details  Name: Caroline Myers MRN: [id number] Date of Birth: 1923-08-25  Today's Date: 07/27/2015 OT Individual Time: 0730-0900 OT Individual Time Calculation (min): 90 min   Short Term Goals: Week 1:  OT Short Term Goal 1 (Week 1): STG=LTG d/t short LOS  Skilled Therapeutic Interventions/Progress Updates: ADL-retraining with focus on improved activity tolerance, safety awareness, and toileting thoroughness.   Pt received supine in bed, alert and attentive, receiving supplemental 02, 3L/min rate.   Pt challenged to participate in session without supplemental 02 to allow assessment of recovery.   Pt completes transfer to w/c and consume breakfast seated, 02 sats consistently 94%.  Pt ambulates to bathroom, toilets, performs hygiene standing, and transfers to tub bench to shower sitting and standing.   With vc to rest, refrain from excessive talking, self-monitor symptoms, and conserve energy by sitting, pt completes all tasks with only mild SOB: 02 sats 91%, HR max of 101 bpm, recovering to 88 within 3 minutes.   Pt dresses at w/c near sink with setup to apply TEDs.   Pt then ambulates from her room to family room to prepare a cup of coffee with mod vc for use of rollator correctly, vc for improve respirations using pursed-lip breathing, and close supervision for safety.   Pt able to converse during entire session however requires cues to self-monitor symptoms and rest frequently.    Pt engaged in entire session, 90 min w/o supplemental oxygen, reporting intermittent SOB however recovering with 02 sats > 90%. Pt left seated and resting in her w/c with all needs within reach and advised to use call light if noting change in symptoms.      Therapy Documentation Precautions:  Precautions Precautions: Fall Precaution Comments: watch O2 Restrictions Weight Bearing Restrictions: No  Vital Signs: Therapy Vitals Pulse Rate: 88 Patient Position (if  appropriate): Sitting Oxygen Therapy SpO2: 94 % O2 Device: Not Delivered Pulse Oximetry Type: Intermittent   Pain: Pain Assessment Pain Assessment: No/denies pain   ADL: ADL ADL Comments: see Functional Assessment Tool   See Function Navigator for Current Functional Status.   Therapy/Group: Individual Therapy  Tarique Loveall 07/27/2015, 9:34 AM

## 2015-07-27 NOTE — Progress Notes (Signed)
[facility] PHYSICAL MEDICINE & REHABILITATION     PROGRESS NOTE  Subjective/Complaints:  Pt slept well last night but continues to be confused at times.  She consistently asks for reassurance regarding her progress.      ROS: Denies cough, CP, SOB, n/v/d.   Objective: Vital Signs: Blood pressure 99/74, pulse 96, temperature 98.2 F (36.8 C), temperature source Oral, resp. rate 18, weight 59.6 kg (131 lb 6.3 oz), SpO2 100 %. No results found.  Recent Labs  07/24/15 0847 07/27/15 0643  WBC 20.9* 14.4*  HGB 14.8 11.8*  HCT 45.3 36.0  PLT 252 200    Recent Labs  07/24/15 0847  NA 141  K 4.3  CL 100*  GLUCOSE 131*  BUN 28*  CREATININE 0.96  CALCIUM 8.7*   CBG (last 3)  No results for input(s): GLUCAP in the last 72 hours.  Wt Readings from Last 3 Encounters:  07/27/15 59.6 kg (131 lb 6.3 oz)  07/20/15 72.122 kg (159 lb)  05/30/15 64.864 kg (143 lb)    Physical Exam:  BP 99/74 mmHg  Pulse 96  Temp(Src) 98.2 F (36.8 C) (Oral)  Resp 18  Wt 59.6 kg (131 lb 6.3 oz)  SpO2 100% Constitutional:  No distress. Frail appearing. Vital signs reviewed HENT: Negative JVD. Normocephalic. Atraumatic Eyes: Conjunctivae and EOM are normal.  Cardiovascular: Irregular irregular with systolic murmur Respiratory:  +[place]. No respiratory distress. Unlabored breathing. GI: Soft. Bowel sounds are normal. She exhibits no distension.  MSK: LE edema. No tenderness Neurological: She is alert and oriented.   Motor: B/l UE 4+/5 proximal to distal B/l LE 4+/5 proximal to distal  Skin: Skin is warm and dry.  Ecchymosis dorsal right hand, Ecchymosis right anterior and medial leg (improving) Psychiatric: Her behavior is anxious. Judgment normal.   Assessment/Plan: 1. Functional deficits secondary to sepsis/pneumonia which require 3+ hours per day of interdisciplinary therapy in a comprehensive inpatient rehab setting. Physiatrist is providing close team supervision and 24 hour management  of active medical problems listed below. Physiatrist and rehab team continue to assess barriers to discharge/monitor patient progress toward functional and medical goals.  Function:  Bathing Bathing position   Position: Shower  Bathing parts Body parts bathed by patient: Right arm, Left arm, Chest, Abdomen, Front perineal area, Right upper leg, Left upper leg, Right lower leg, Left lower leg Body parts bathed by helper: Buttocks, Back  Bathing assist Assist Level: Touching or steadying assistance(Pt > 75%)      Upper Body Dressing/Undressing Upper body dressing   What is the patient wearing?: Pull over shirt/dress Bra - Perfomed by patient: Thread/unthread right bra strap, Thread/unthread left bra strap Bra - Perfomed by helper: Hook/unhook bra (pull down sports bra) Pull over shirt/dress - Perfomed by patient: Thread/unthread right sleeve, Thread/unthread left sleeve, Put head through opening, Pull shirt over trunk Pull over shirt/dress - Perfomed by helper: Put head through opening, Pull shirt over trunk        Upper body assist Assist Level: Supervision or verbal cues      Lower Body Dressing/Undressing Lower body dressing   What is the patient wearing?: Underwear, Pants, Shoes, [employer]  - Performed by patient: Thread/unthread right underwear leg, Thread/unthread left underwear leg, Pull underwear up/down Underwear - Performed by helper: Thread/unthread right underwear leg, Thread/unthread left underwear leg Pants- Performed by patient: Thread/unthread right pants leg, Thread/unthread left pants leg, Pull pants up/down Pants- Performed by helper: Thread/unthread right pants leg, Thread/unthread left pants leg  Non-skid slipper socks- Performed by helper: Don/doff right sock, Don/doff left sock     Shoes - Performed by patient: Don/doff left shoe, Fasten left Shoes - Performed by helper: Don/doff right shoe, Fasten right (due to fatigue)       TED Hose -  Performed by helper: Don/doff right TED hose, Don/doff left TED hose  Lower body assist Assist for lower body dressing: Supervision or verbal cues      Toileting Toileting   Toileting steps completed by patient: Adjust clothing prior to toileting, Performs perineal hygiene, Adjust clothing after toileting Toileting steps completed by helper: Adjust clothing prior to toileting, Adjust clothing after toileting, Performs perineal hygiene Toileting Assistive Devices: Grab bar or rail  Toileting assist Assist level: Touching or steadying assistance (Pt.75%)   Transfers Chair/bed transfer   Chair/bed transfer method: Ambulatory Chair/bed transfer assist level: Touching or steadying assistance (Pt > 75%) Chair/bed transfer assistive device: Armrests     Locomotion Ambulation     Max distance: 244 Assist level: Touching or steadying assistance (Pt > 75%)   Wheelchair   Type: Manual Max wheelchair distance: 200 Assist Level: Supervision or verbal cues  Cognition Comprehension Comprehension assist level: Follows basic conversation/direction with extra time/assistive device  Expression Expression assist level: Expresses complex 90% of the time/cues < 10% of the time  Social Interaction Social Interaction assist level: Interacts appropriately with others - No medications needed.  Problem Solving Problem solving assist level: Solves complex 90% of the time/cues < 10% of the time  Memory Memory assist level: Recognizes or recalls 90% of the time/requires cueing < 10% of the time    Medical Problem List and Plan: 1. Debilitation/acute respiratory failure secondary to sepsis/pneumonia. Continue nebulizer as directed.   Continue CIR 2. DVT Prophylaxis/Anticoagulation: Eliquis to continue 3. Pain Management: Tylenol 4. Mood: Zoloft 50 mg daily. Provide emotional support 5. Neuropsych: This patient is capable of making decisions on her own behalf. 6. Skin/Wound Care: Routine skin checks 7.  Fluids/Electrolytes/Nutrition: Routine I&O's  Hypokalemia: Resolved, 4.3 on 1/23  Labs pending today  8. Atrial fibrillation. Cardiac rate control. Continue Cardizem 180 mg daily 9. Leukocytosis/pneumonia  -zosyn stopped now on augmentin until 1/27  -prednisone taper   -CXR 1/22 indicating persistent small pleural effusions   -WBCs 14.4 on 1/26, trending down, likely secondary to steroids, no fevers chills   1/26 labs potentially dilutional, will reorder labs for tomorrow 10. AKI  Cr. .96 on 1/23, stable   Labs pending  Will encourage fluid intake and consider IVF if necessary 11. Cough: Improved  Encourage patient to use acappella   Will continue to monitor   LOS (Days) 7 A FACE TO FACE EVALUATION WAS PERFORMED  Ankit Lorie Phenix 07/27/2015 8:11 AM

## 2015-07-28 ENCOUNTER — Inpatient Hospital Stay (HOSPITAL_COMMUNITY): Payer: Medicare Other

## 2015-07-28 ENCOUNTER — Other Ambulatory Visit: Payer: Self-pay

## 2015-07-28 ENCOUNTER — Inpatient Hospital Stay (HOSPITAL_COMMUNITY): Payer: Medicare Other | Admitting: Occupational Therapy

## 2015-07-28 ENCOUNTER — Inpatient Hospital Stay (HOSPITAL_COMMUNITY): Payer: Medicare Other | Admitting: Physical Therapy

## 2015-07-28 DIAGNOSIS — F418 Other specified anxiety disorders: Secondary | ICD-10-CM | POA: Insufficient documentation

## 2015-07-28 DIAGNOSIS — R4589 Other symptoms and signs involving emotional state: Secondary | ICD-10-CM | POA: Insufficient documentation

## 2015-07-28 LAB — CBC WITH DIFFERENTIAL/PLATELET
BASOS ABS: 0 10*3/uL (ref 0.0–0.1)
BASOS PCT: 0 %
EOS ABS: 0.2 10*3/uL (ref 0.0–0.7)
EOS PCT: 2 %
HCT: 40.5 % (ref 36.0–46.0)
Hemoglobin: 13.3 g/dL (ref 12.0–15.0)
LYMPHS PCT: 9 %
Lymphs Abs: 1.3 10*3/uL (ref 0.7–4.0)
MCH: 33.8 pg (ref 26.0–34.0)
MCHC: 32.8 g/dL (ref 30.0–36.0)
MCV: 102.8 fL — ABNORMAL HIGH (ref 78.0–100.0)
MONO ABS: 1.5 10*3/uL — AB (ref 0.1–1.0)
Monocytes Relative: 11 %
Neutro Abs: 10.6 10*3/uL — ABNORMAL HIGH (ref 1.7–7.7)
Neutrophils Relative %: 78 %
PLATELETS: 217 10*3/uL (ref 150–400)
RBC: 3.94 MIL/uL (ref 3.87–5.11)
RDW: 13.8 % (ref 11.5–15.5)
WBC: 13.6 10*3/uL — AB (ref 4.0–10.5)

## 2015-07-28 MED ORDER — BUDESONIDE-FORMOTEROL FUMARATE 80-4.5 MCG/ACT IN AERO: 2.0000 | INHALATION_SPRAY | Freq: Two times a day (BID) | RESPIRATORY_TRACT | Status: DC

## 2015-07-28 MED ORDER — APIXABAN 5 MG PO TABS: 5.0000 mg | ORAL_TABLET | Freq: Two times a day (BID) | ORAL | Status: DC

## 2015-07-28 MED ORDER — PROAIR HFA 108 (90 BASE) MCG/ACT IN AERS: 1.0000 | INHALATION_SPRAY | RESPIRATORY_TRACT | Status: DC | PRN

## 2015-07-28 MED ORDER — ACYCLOVIR 5 % EX CREA
TOPICAL_CREAM | CUTANEOUS | Status: DC
  Filled 2015-07-28: qty 5

## 2015-07-28 MED ORDER — ACYCLOVIR 5 % EX CREA: 1.0000 "application " | TOPICAL_CREAM | CUTANEOUS | Status: DC

## 2015-07-28 MED ORDER — SACCHAROMYCES BOULARDII 250 MG PO CAPS: 250.0000 mg | ORAL_CAPSULE | Freq: Two times a day (BID) | ORAL | Status: DC

## 2015-07-28 MED ORDER — NYSTATIN 100000 UNIT/GM EX CREA: TOPICAL_CREAM | Freq: Two times a day (BID) | CUTANEOUS | Status: DC

## 2015-07-28 MED ORDER — NYSTATIN 100000 UNIT/GM EX CREA
TOPICAL_CREAM | Freq: Two times a day (BID) | CUTANEOUS | Status: DC
  Administered 2015-07-28 – 2015-07-29 (×3): via TOPICAL
  Filled 2015-07-28: qty 15

## 2015-07-28 MED ORDER — WHITE PETROLATUM GEL: Status: DC | PRN

## 2015-07-28 MED ORDER — GUAIFENESIN ER 600 MG PO TB12: 1200.0000 mg | ORAL_TABLET | Freq: Two times a day (BID) | ORAL | Status: DC

## 2015-07-28 MED ORDER — SERTRALINE HCL 50 MG PO TABS: 50.0000 mg | ORAL_TABLET | Freq: Every day | ORAL | Status: AC

## 2015-07-28 MED ORDER — ACYCLOVIR 5 % EX OINT
TOPICAL_OINTMENT | CUTANEOUS | Status: DC
  Administered 2015-07-28 – 2015-07-29 (×5): via TOPICAL
  Filled 2015-07-28: qty 15

## 2015-07-28 MED ORDER — ALPRAZOLAM 0.25 MG PO TABS: 0.2500 mg | ORAL_TABLET | Freq: Two times a day (BID) | ORAL | Status: DC | PRN

## 2015-07-28 MED ORDER — DILTIAZEM HCL ER COATED BEADS 180 MG PO CP24: 180.0000 mg | ORAL_CAPSULE | Freq: Every day | ORAL | Status: DC

## 2015-07-28 NOTE — Progress Notes (Signed)
Occupational Therapy Session Note  Patient Details  Name: Caroline Myers MRN: [id number] Date of Birth: 1923/10/19  Today's Date: 07/28/2015 OT Individual Time: 0930-1030 OT Individual Time Calculation (min): 60 min   Short Term Goals: Week 1:  OT Short Term Goal 1 (Week 1): STG=LTG d/t short LOS  Skilled Therapeutic Interventions/Progress Updates: ADL-retraining with focus on memory, safety awareness and performance of unassisted BADL using DME/AE, prn.  Pt received seated in w/c awaiting therapist and noted w/o use of supplemental oxygen.   Pt educated on purpose of session to demonstrate competence and carry-over of previous training.   With min instructional cues and setup to provide RW, pt ambulates to bathroom to toilet and to shower with only intermittent supervision and vc to problem-solve and sequence tasks d/t inattention and immediate memory deficits.    Pt voids urine and BM, completing clothing management and hygiene with vc to stand to wipe herself as she is unable to reach while seated for needed thoroughness.   OT has repeated this recommendation from previous sessions however pt continues to require reminders.   Pt ambulates short distance to shower and bathes, sitting and standing unassisted.   Pt completes shower and requires reminder to use RW to return to sink to sit, rest, and resume BADL (dressing).    Pt completes dressing with min vc to sequence.   OT notes skin irritation at buttocks and alerts RN to need for wound inspection (rash).   Pt recovers to w/c at end of session with all needs within reach.   Overall, pt requires only supervision assistance for safety d/t memory impairment limiting carry-over of previous instruction to conserve energy, use DME and AE effectively and to problem-solve when confronted with challenge to her normal routine.         Therapy Documentation Precautions:  Precautions Precautions: Fall Precaution Comments: watch O2 Restrictions Weight  Bearing Restrictions: No   Pain: Pain Assessment Pain Assessment: No/denies pain Pain Score: 0-No pain   ADL: ADL ADL Comments: see Functional Assessment Tool  See Function Navigator for Current Functional Status.   Therapy/Group: Individual Therapy  [place] 07/28/2015, 12:42 PM

## 2015-07-28 NOTE — Progress Notes (Signed)
Social Work Patient ID: Caroline Myers, female   DOB: [birth year], 82 y.o.   MRN: [id number] Husband and daughter here yesterday to attend PT session and husband participated in her care. Many medical questions regarding if pt's pneumonia cleared and her nose wound along with General prognosis questions. Have asked MD to contact family which this am he did and answered their questions. Plan for discharge tomorrow, pt doe not need O2 now and is using a rolling walker. She is feeling better and has had two good days in a row which encourages her. Asked family to come in and attend OT session with pt but is unsure if this will occur prior to discharge. Will talk with husband and Daughter if agreeable or want to pursue medicare appeal.

## 2015-07-28 NOTE — Progress Notes (Signed)
Area at pt's rt. nare with increased redness, area larger (spreading)  now over septum, raised papules noted. Dr. Posey Pronto made aware this AM.

## 2015-07-28 NOTE — Discharge Instructions (Signed)
Inpatient Rehab Discharge Instructions  Caroline Myers Discharge date and time: No discharge date for patient encounter.   Activities/Precautions/ Functional Status: Activity: activity as tolerated Diet: regular diet Wound Care: none needed Functional status:  ___ No restrictions     ___ Walk up steps independently ___ 24/7 supervision/assistance   ___ Walk up steps with assistance ___ Intermittent supervision/assistance  ___ Bathe/dress independently ___ Walk with walker     _x__ Bathe/dress with assistance ___ Walk Independently    ___ Shower independently _x__ Walk with assistance    ___ Shower with assistance ___ No alcohol     ___ Return to work/school ________  Special Instructions:    COMMUNITY REFERRALS UPON DISCHARGE:    Home Health:   PT, OT, RN, [place]   Date of last service:07/29/2015   Medical Equipment/Items Ordered:HAS ALL NEEDED EQUIPMENT FROM PREVIOUS ADMITS OTHER: [facility] CASE MANAGEMENT TO FOLLOW AT HOME.      My questions have been answered and I understand these instructions. I will adhere to these goals and the provided educational materials after my discharge from the hospital.  Patient/Caregiver Signature _______________________________ Date __________  Clinician Signature _______________________________________ Date __________  Please bring this form and your medication list with you to all your follow-up doctor's appointments.

## 2015-07-28 NOTE — Progress Notes (Signed)
Social Work  Discharge Note  The overall goal for the admission was met for:   Discharge location: [place], HUSBAND CAN BE THERE WITH HER AND PROVIDE SUPERVISION  Length of Stay: Yes-9 DAYS  Discharge activity level: Yes-SUPERVISION LEVEL-CUES  Home/community participation: Yes  Services provided included: MD, RD, PT, OT, SLP, RN, CM, TR, Pharmacy and SW  Financial Services: Medicare and Private Insurance: [facility]  Follow-up services arranged: Home Health: [facility]-PT,OT,RN,AIDE and Patient/Family request agency HH: [facility], DME: NO NEEDS  Comments (or additional information):HUSBAND AND DAUGHTER ATTENDED THERAPIES AND SPOKE WITH MD & PA TO GET THEIR MEDICAL QUESTIONS ANSWERED.  REALIZED HOW WELL PT WAS DOING WHEN SAW IN THERAPIES. [facility]-CASE MANAGER WILL FOLLOW ALSO IN THE COMMUNITY  Patient/Family verbalized understanding of follow-up arrangements: Yes  Individual responsible for coordination of the follow-up plan: ROBERT-HUSBAND & [name]-DAUGHTER  Confirmed correct DME delivered: [name] 07/28/2015    [name]

## 2015-07-28 NOTE — Progress Notes (Signed)
Spoke to pt's husband at length and answered all questions he had pertaining to his wife's care.  He also notes his wife's significant baseline anxiety, which he and his daughter believe has improved once she's been in the hospital.  Encourage husband to follow up with any further questions he may have.  Delice Lesch, MD

## 2015-07-28 NOTE — Progress Notes (Signed)
Physical Therapy Discharge Summary  Patient Details  Name: Caroline Myers MRN: [id number] Date of Birth: 12/08/1923   Patient has met 11 of 12 long term goals due to improved activity tolerance, improved balance, improved postural control and increased strength.  Patient to discharge at an ambulatory level Supervision.   Patient's care partner is independent to provide the necessary cognitive and supervision assistance at discharge.  Reasons goals not met: Did not meet floor transfer goal of supervision-pt requires mod-max A to transfer from floor > furniture.  Family educated on alerting EMS if pt sustains a fall at home and not to attempt to assist pt from floor.    Recommendation:  Patient will benefit from ongoing skilled PT services in home health setting to continue to advance safe functional mobility, address ongoing impairments in activity tolerance/endurance, strength, postural control, balance, gait, and minimize fall risk.  Equipment: No equipment provided  Reasons for discharge: treatment goals met and discharge from hospital  Patient/family agrees with progress made and goals achieved: Yes  PT Discharge Precautions/Restrictions Precautions Precautions: Fall Precaution Comments: watch O2 Restrictions Weight Bearing Restrictions: No Vital Signs Therapy Vitals Temp: 97.7 F (36.5 C) Temp Source: Oral Pulse Rate: 76 Resp: 18 BP: 99/66 mmHg Patient Position (if appropriate): Sitting Oxygen Therapy SpO2: 99 % O2 Device: Not Delivered Pain Pain Assessment Pain Assessment: No/denies pain Sensation  Intact Motor   Generalized weakness  Mobility  Mod I bed mobility, supervision transfers Locomotion   Supervision for gait with RW in home and controlled environments up to 250' Stair negotiation with UE support with supervision; one step negotiation with RW with supervision-min A for home entry/exit Trunk/Postural Assessment   Impaired balance reactions   Balance  [place]erg 29/56 indicating high falls risk    See Function Navigator for Current Functional Status.  Raylene Everts [place] 07/28/2015, 3:59 PM

## 2015-07-28 NOTE — Progress Notes (Signed)
Physical Therapy Session Note  Patient Details  Name: Caroline Myers MRN: [id number] Date of Birth: Oct 31, 1923  Today's Date: 07/28/2015 PT Individual Time: 0800-0900 and 1500-1530 PT Individual Time Calculation (min): 60 min and 30 min (total 90 min)   Short Term Goals: Week 1:  PT Short Term Goal 1 (Week 1): = LTGs due to short LOS at supervision level overall  Skilled Therapeutic Interventions/Progress Updates:    Tx 1: Pt received seated on EOB with no c/o pain and agreeable to treatment. Respiratory therapist present to administer breathing treatment; while receiving treatment pt discussed anxiety regarding d/c tomorrow, feels as though she has gotten worse and "although I'm not a young chicken" reports she was "doing more than this" when she was admitted. Pt states "I've been doing all my therapy but I don't see what that has to do with being at home"; educated pt in carryover of functional tasks performed in therapy to daily activities at home. Talked through pt's daily schedule and attempted to identify areas of concern or activities pt did not think she would be able to participate in; pt unable to name specific activities she is concerned about but perseverates on "not feeling myself anymore". BP assessed with dynamap 155/132; reassessed manually at 97/54. Per RN, O2 via [place] to be removed for session with spot checks. Stand pivot transfer bed>w/c with S and use of arm rests. W/c propulsion x200' with S and decreased speed. 2 minute walk test performed; ambulated 169 ft with RW and S, O2 at completion of trial 99%. Gait 1 x 75' to assess gait speed; 2.10 ft/sec. Returned to room with pt requesting to use restroom; ambulation in and out of bathroom with RW and S. Required assist for peri hygiene and donning brief. Remained seated in w/c at completion of session, all needs within reach.   Tx 2: Pt received seated in recliner, no c/o pain and agreeable to treatment. Daughter and husband present  to observe session. Gait in room with RW and S x15' to w/c. Car transfer performed with S and RW. Ascent/descent on 4.5 inch steps with 2 handrails and S. In ADL apartment, performed transfer bed <>w/c with S and sit <>supine mod I. Educated family on home safety and strong recommendation that pt continue to use RW at home for safety. Pt and family given handout with home safety and fall prevention checklist. Pt ambulated back to recliner x15' with Rw and S. Remained seated in recliner with family present at completion of session, all needs within reach.   Therapy Documentation Precautions:  Precautions Precautions: Fall Precaution Comments: watch O2 Restrictions Weight Bearing Restrictions: No Pain: Pain Assessment Pain Assessment: No/denies pain Pain Score: 0-No pain   See Function Navigator for Current Functional Status.   Therapy/Group: Individual Therapy  Luberta Mutter 07/28/2015, 8:52 AM

## 2015-07-28 NOTE — Discharge Summary (Signed)
Discharge summary job [id number]

## 2015-07-28 NOTE — Progress Notes (Signed)
Occupational Therapy Session Note  Patient Details  Name: Caroline Myers MRN: [id number] Date of Birth: [birth year]  Today's Date: 07/28/2015 OT Individual Time: 1330-1415 OT Individual Time Calculation (min): 45 min    Short Term Goals: Week 1:  OT Short Term Goal 1 (Week 1): STG=LTG d/t short LOS  Skilled Therapeutic Interventions/Progress Updates:    Pt ambulated to the therapy gym with supervision using the RW.  Sats checked at 99% on room air after ambulation with HR around 85 BPM.  Had pt work on 3 dimensional puzzle in both sitting and standing.  She was able to complete with supervision and min instructional cueing.  Pt needing mod instructional cueing with sit to stand transitions and transfers with regards to hand placement as she sometimes attempts to pull up on the walker.  Pt returned to room at end of session with family present.  No questions from daughter or spouse at this time.   Therapy Documentation Precautions:  Precautions Precautions: Fall Precaution Comments: watch O2 Restrictions Weight Bearing Restrictions: No  Pain: Pain Assessment Pain Assessment: No/denies pain ADL: See Function Navigator for Current Functional Status.   Therapy/Group: Individual Therapy  Yisroel Mullendore OTR/L 07/28/2015, 3:43 PM

## 2015-07-28 NOTE — Progress Notes (Signed)
[facility] PHYSICAL MEDICINE & REHABILITATION     PROGRESS NOTE  Subjective/Complaints:  Pt laying in bed this AM. She is anxious on arrival and asks questions about improvement and breathing.  She wants to know why she isn't the same as before she came to the hospital. She states her family would like to speak with physician.  Attempted to call family x4 yesterday unsuccessfully.   ROS: +Anxious.  Denies cough, CP, SOB, n/v/d.   Objective: Vital Signs: Blood pressure 97/44, pulse 78, temperature 98 F (36.7 C), temperature source Oral, resp. rate 17, weight 65 kg (143 lb 4.8 oz), SpO2 99 %. No results found.  Recent Labs  07/27/15 0643  WBC 14.4*  HGB 11.8*  HCT 36.0  PLT 200    Recent Labs  07/27/15 0643  NA 141  K 4.2  CL 105  GLUCOSE 85  BUN 25*  CREATININE 0.87  CALCIUM 8.0*   CBG (last 3)  No results for input(s): GLUCAP in the last 72 hours.  Wt Readings from Last 3 Encounters:  07/28/15 65 kg (143 lb 4.8 oz)  07/20/15 72.122 kg (159 lb)  05/30/15 64.864 kg (143 lb)    Physical Exam:  BP 97/44 mmHg  Pulse 78  Temp(Src) 98 F (36.7 C) (Oral)  Resp 17  Wt 65 kg (143 lb 4.8 oz)  SpO2 99% Constitutional:  No distress. Frail appearing. Vital signs reviewed HENT: Negative JVD. Normocephalic. Atraumatic. Redness around right nostril. Eyes: Conjunctivae and EOM are normal.  Cardiovascular: Irregular irregular with systolic murmur Respiratory:  +. Mild course expiratory sounds. No respiratory distress. Unlabored breathing. GI: Soft. Bowel sounds are normal. She exhibits no distension.  MSK: No edema. No tenderness Neurological: She is alert and oriented.   Motor: B/l UE 4+/5 proximal to distal B/l LE 4+/5 proximal to distal  Skin: Skin is warm and dry.  Ecchymosis dorsal right hand, Ecchymosis right anterior and medial leg (improving) Psychiatric: Her behavior is anxious. Judgment normal.   Assessment/Plan: 1. Functional deficits secondary to  sepsis/pneumonia which require 3+ hours per day of interdisciplinary therapy in a comprehensive inpatient rehab setting. Physiatrist is providing close team supervision and 24 hour management of active medical problems listed below. Physiatrist and rehab team continue to assess barriers to discharge/monitor patient progress toward functional and medical goals.  Function:  Bathing Bathing position   Position: Shower  Bathing parts Body parts bathed by patient: Right arm, Left arm, Chest, Abdomen, Front perineal area, Buttocks, Right upper leg, Left upper leg, Right lower leg, Left lower leg Body parts bathed by helper: Buttocks, Back  Bathing assist Assist Level: Supervision or verbal cues      Upper Body Dressing/Undressing Upper body dressing   What is the patient wearing?: Pull over shirt/dress Bra - Perfomed by patient: Thread/unthread right bra strap, Thread/unthread left bra strap Bra - Perfomed by helper: Hook/unhook bra (pull down sports bra) Pull over shirt/dress - Perfomed by patient: Thread/unthread right sleeve, Thread/unthread left sleeve, Put head through opening, Pull shirt over trunk Pull over shirt/dress - Perfomed by helper: Put head through opening, Pull shirt over trunk        Upper body assist Assist Level: More than reasonable time      Lower Body Dressing/Undressing Lower body dressing   What is the patient wearing?: Underwear, Pants, Shoes, [employer]  - Performed by patient: Thread/unthread right underwear leg, Thread/unthread left underwear leg, Pull underwear up/down Underwear - Performed by helper: Thread/unthread right underwear leg,  Thread/unthread left underwear leg Pants- Performed by patient: Thread/unthread right pants leg, Thread/unthread left pants leg, Pull pants up/down, Fasten/unfasten pants Pants- Performed by helper: Thread/unthread right pants leg, Thread/unthread left pants leg   Non-skid slipper socks- Performed by helper: Don/doff  right sock, Don/doff left sock     Shoes - Performed by patient: Don/doff right shoe, Don/doff left shoe, Fasten right, Fasten left (using elastic laces and shoe horn) Shoes - Performed by helper: Don/doff right shoe, Fasten right (due to fatigue)       TED Hose - Performed by helper: Don/doff right TED hose, Don/doff left TED hose  Lower body assist Assist for lower body dressing: Set up   Set up : Don/doff TED stockings  Toileting Toileting   Toileting steps completed by patient: Adjust clothing prior to toileting, Performs perineal hygiene, Adjust clothing after toileting Toileting steps completed by helper: Adjust clothing prior to toileting, Adjust clothing after toileting, Performs perineal hygiene Toileting Assistive Devices: Grab bar or rail  Toileting assist Assist level: Supervision or verbal cues   Transfers Chair/bed transfer   Chair/bed transfer method: Ambulatory Chair/bed transfer assist level: Supervision or verbal cues Chair/bed transfer assistive device: Armrests, Medical sales representative     Max distance: 150 Assist level: Supervision or verbal cues   Wheelchair   Type: Manual Max wheelchair distance: 200 Assist Level: Supervision or verbal cues  Cognition Comprehension Comprehension assist level: Follows basic conversation/direction with no assist  Expression Expression assist level: Expresses complex 90% of the time/cues < 10% of the time  Social Interaction Social Interaction assist level: Interacts appropriately with others with medication or extra time (anti-anxiety, antidepressant).  Problem Solving Problem solving assist level: Solves complex 90% of the time/cues < 10% of the time  Memory Memory assist level: Recognizes or recalls 75 - 89% of the time/requires cueing 10 - 24% of the time    Medical Problem List and Plan: 1. Debilitation/acute respiratory failure secondary to sepsis/pneumonia. Continue nebulizer as directed.   Continue  CIR 2. DVT Prophylaxis/Anticoagulation: Eliquis to continue 3. Pain Management: Tylenol 4. Mood: Zoloft 50 mg daily. Provide emotional support 5. Neuropsych: This patient is capable of making decisions on her own behalf. 6. Skin/Wound Care: Routine skin checks 7. Fluids/Electrolytes/Nutrition: Routine I&O's  Hypokalemia: Resolved, 4.2 on 1/26 8. Atrial fibrillation. Cardiac rate control. Continue Cardizem 180 mg daily 9. Leukocytosis/pneumonia  -zosyn stopped now on augmentin, last dose today  -prednisone taper   -CXR 1/22 indicating persistent small pleural effusions, repeat CXR ordered for today  -WBCs 14.4 on 1/26, trending down, likely secondary to steroids, no fevers chills   Labs pending 10. AKI: Resolved  Cr. .87 on 1/26, stable   Cont encourage fluid intake  11. Cough: Improved  Encourage patient to use acappella   Will continue to monitor   LOS (Days) 8 A FACE TO FACE EVALUATION WAS PERFORMED  Emnet Monk Lorie Phenix 07/28/2015 8:29 AM

## 2015-07-28 NOTE — Progress Notes (Signed)
Social Work Patient ID: Caroline Myers, female   DOB: Jan 20, 1924, 82 y.o.   MRN: [id number] Met with husband and pt who feel she is ready to go home tomorrow, after talking with MD and Dan-PA they feel better about her medically and once chest x-ray done and know the results will be better. [facility] will meet with them to discuss their services and follow up in the community. All ready for discharge tomorrow.

## 2015-07-28 NOTE — Consult Note (Signed)
   [facility] CM Inpatient Consult   07/28/2015  Caroline Myers [birth year] [id number] Patient evaluated for community based chronic disease management services with [facility] [facility] as a benefit of patient's Medicare Insurance. Admitted to inpatient rehab s/p stroke.  Spoke with patient, husband, Herbie [place], and daughter, [name] at bedside to explain [facility] services.  Consent form signed. Patient endorses that Dr. Lona Kettle is her primary care provider.  Patient will receive post hospital discharge call and will be evaluated for monthly home visits for assessments and disease process education.  Left contact information and [facility] literature at bedside. Made Inpatient Case Manager aware that [facility] following. Of note, [facility] Care Management services does not replace or interfere with any services that are arranged by inpatient case management or social work.  For additional questions or referrals please contact:   Natividad Brood, RN BSN [facility] Liaison  [phone] business mobile phone Toll free office [phone]

## 2015-07-29 NOTE — Discharge Summary (Signed)
Caroline Myers, SALVETTI NO.:  [id number]  MEDICAL RECORD NO.:  [id number]  LOCATION:  4W26C                        FACILITY:  [facility]  PHYSICIAN:  Delice Lesch, MD        DATE OF BIRTH:  Dec 24, 1923  DATE OF ADMISSION:  07/20/2015 DATE OF DISCHARGE:  07/29/2015                              DISCHARGE SUMMARY   DISCHARGE DIAGNOSES: 1. Debilitation, acute respiratory failure secondary to sepsis with     pneumonia. 2. Eliquis for atrial fibrillation. 3. Hypertension. 4. Acute renal insufficiency, improved. 5. Pneumonia with Augmentin, completed on July 28, 2015. 6. Anxiety  HISTORY OF PRESENT ILLNESS:  This is an 82 year old right-handed female, history of atrial fibrillation, maintained on Eliquis as well as CVA x2. She lives with her husband, one-level home.  Ambulating short distances prior to admission.  Noted, recent falls over the past few weeks. Presented on July 15, 2015, with hypotension, systolic pressures in the 60s, tachypnea, as well as tachycardia with low-grade fever and productive cough.  Lactic acid level 3.03.  Chest x-ray negative.  Blood cultures completed, showing no growth.  Placed on empiric antibiotics. Followup chest x-ray showed bronchitic changes with questionable infiltrate versus atelectasis.  The patient transitioned from intravenous Solu-Medrol to p.o. prednisone.  Troponin mildly elevated, felt to be related to demand ischemia, placed on gentle IV fluids. Echocardiogram with ejection fraction [id number], normal systolic function. Tolerating a regular diet.  Physical and occupational therapy ongoing. The patient was admitted for a comprehensive rehab program.  PAST MEDICAL HISTORY:  See discharge diagnoses.  SOCIAL HISTORY:  Lives with spouse, ambulating short distances prior to admission with assistive device.  FUNCTIONAL STATUS:  Upon admission to [facility] was min-mod assist, 2 feet, 2-person handheld assistance, +2 physical  assist sit to stand; min-mod assist, activities of daily living.  PHYSICAL EXAMINATION:  VITAL SIGNS:  Blood pressure 128/80, pulse 83, temperature 97, respirations 22. GENERAL:  This was an alert female, oriented to person, place, and time. No acute distress. LUNGS:  Decreased breath sounds.  Clear to auscultation. CARDIAC:  Regular rate and rhythm without murmur. ABDOMEN:  Soft, nontender.  Good bowel sounds.  REHABILITATION HOSPITAL COURSE:  The patient was admitted to inpatient rehab services with therapies initiated on a 3-hour daily basis consisting of physical therapy, occupational therapy, and rehabilitation nursing.  The following issues were addressed during the patient's rehabilitation stay.  Pertaining to Ms. Sandoz's debilitation, acute respiratory failure secondary to sepsis pneumonia.  She remained afebrile.  She was completing antibiotic therapy on July 28, 2015. Latest follow-up chest x-ray 07/28/2015 stable with mild associated pleural effusions. White blood cell count improved to 14.4.  She continued on Eliquis for history of atrial fibrillation, cardiac rate controlled.  Bouts of constipation resolved with laxative assistance.  She would continue on Zoloft for history of depression, emotional support provided.  She was attending full therapies.  The patient received weekly collaborative interdisciplinary team conferences to discuss estimated length of stay, family teaching, and any barriers to discharge.  Sessions focused on energy conservation.  She would need prompting to use her walker. Ambulating 150 feet with some cues, min assist sit to stand, floor transfers, +  2 physical assist.  Husband received training on helping the patient navigate up and down stairs into the house.  Her oxygen saturation stayed good between 90% and 100% during sessions.  She could gather her belongings for activities of daily living and homemaking but needing some supervision.   Received re-training for improved activity tolerance, safety awareness, and toileting.  Complete transfers wheelchair and can consume her breakfast seated.  Ambulates to the bathroom and toilet.  Full family teaching was completed and plan discharge to home.  DISCHARGE MEDICATIONS:  Included; 1. Eliquis 5 mg p.o. b.i.d. 2. Brovana nebulizer solution 15 mcg twice daily. 3. B complex vitamin daily. 4. Pulmicort nebulizer 0.25 mg twice daily. 5. Cardizem CD 180 mg p.o. daily. 6. Flonase 2 sprays each nostril daily. 7. Claritin 10 mg p.o. daily. 8. Florastor 250 mg p.o. b.i.d. 9. Zoloft 50 mg p.o. daily.  DIET:  Regular.  FOLLOWUP:  The patient would follow up with Dr. Posey Pronto as needed in the outpatient rehab service office, Dr. Lona Kettle, medical management. Ongoing therapies were arranged as per [employer].     Lauraine Rinne, P.A.   ______________________________ Delice Lesch, MD    DA/MEDQ  D:  07/28/2015  T:  07/28/2015  Job:  [id number]  cc:   Lona Kettle, MD

## 2015-07-29 NOTE — Progress Notes (Signed)
Occupational Therapy Discharge Summary  Patient Details  Name: Caroline Myers MRN: [id number] Date of Birth: 05-16-1924   Patient has met 9 of 10 long term goals due to improved activity tolerance, improved balance and ability to compensate for deficits.  Patient to discharge at overall Supervision level.  Patient's care partner is independent to provide the necessary cognitive assistance at discharge.    Reasons goals not met: Meal preparation goal resolved with plan for family assistance s/p discharge with continued therapy at home, per pt.  Recommendation:  Patient will benefit from ongoing skilled OT services in home health setting to continue to advance functional skills in the area of iADL.  Equipment: No equipment provided  Reasons for discharge: discharge from hospital  Patient/family agrees with progress made and goals achieved: Yes  OT Discharge Precautions/Restrictions  Precautions Precautions: Fall Restrictions Weight Bearing Restrictions: No  Vital Signs Therapy Vitals Temp: 97.8 F (36.6 C) Temp Source: Oral Pulse Rate: 71 Resp: 16 BP: 104/62 mmHg Patient Position (if appropriate): Lying Oxygen Therapy SpO2: 98 % O2 Device: Not Delivered  Pain Pain Assessment Pain Assessment: No/denies pain  ADL ADL ADL Comments: see Functional Assessment Tool  Vision/Perception  Vision- History Baseline Vision/History: No visual deficits Patient Visual Report: No change from baseline Vision- Assessment Vision Assessment?: No apparent visual deficits Perception Comments: WFL   Cognition Overall Cognitive Status: Difficult to assess (Family reports sublte and progressive compensations for gradual decline in mentation) Arousal/Alertness: Awake/alert Orientation Level: Oriented X4 Attention: Selective Sustained Attention: Appears intact Selective Attention: Impaired Selective Attention Impairment: Functional complex Memory: Impaired Memory Impairment:  Decreased short term memory;Retrieval deficit Decreased Short Term Memory: Verbal complex;Functional complex Awareness: Appears intact Problem Solving: Impaired Problem Solving Impairment: Verbal complex;Functional complex Executive Function: Organizing;Self Monitoring Organizing: Impaired Organizing Impairment: Functional complex;Verbal complex Self Monitoring: Impaired Self Monitoring Impairment: Functional complex Behaviors: Impulsive Safety/Judgment: Appears intact Comments: Cognition seems likely impaired; unclear if some of this is baseline (h/o CVA X2)  Sensation Sensation Light Touch: Appears Intact Stereognosis: Appears Intact Hot/Cold: Appears Intact Proprioception: Appears Intact Coordination Gross Motor Movements are Fluid and Coordinated: Yes Fine Motor Movements are Fluid and Coordinated: Yes  Motor  Motor Motor: Within Functional Limits Motor - Discharge Observations: Improved activity tolerance; pt able to perform BADL w/o use of supplemental oxygen.  Mobility  Bed Mobility Bed Mobility: Rolling Right;Right Sidelying to Sit;Sitting - Scoot to Edge of Bed Rolling Right: 6: Modified independent (Device/Increase time) Right Sidelying to Sit: 6: Modified independent (Device/Increase time) Sitting - Scoot to Edge of Bed: 6: Modified independent (Device/Increase time) Transfers Transfers: Sit to Stand;Stand to Sit Sit to Stand: 6: Modified independent (Device/Increase time);With upper extremity assist Stand to Sit: 6: Modified independent (Device/Increase time);With upper extremity assist   Trunk/Postural Assessment  Cervical Assessment Cervical Assessment: Within Functional Limits Thoracic Assessment Thoracic Assessment: Within Functional Limits Thoracic AROM Overall Thoracic AROM: Due to premorid status Lumbar Assessment Lumbar Assessment: Exceptions to [facility] Lumbar AROM Overall Lumbar AROM: Due to premorid status Postural Control Postural Control: Within  Functional Limits   Balance Balance Balance Assessed: Yes Static Sitting Balance Static Sitting - Level of Assistance: 6: Modified independent (Device/Increase time) Dynamic Sitting Balance Dynamic Sitting - Level of Assistance: 6: Modified independent (Device/Increase time) Static Standing Balance Static Standing - Level of Assistance: 6: Modified independent (Device/Increase time) Dynamic Standing Balance Dynamic Standing - Level of Assistance: 5: Stand by assistance  Extremity/Trunk Assessment RUE Assessment RUE Assessment: Exceptions to [facility] RUE Strength RUE Overall Strength:  Due to premorbid status (old RTC injury) LUE Assessment LUE Assessment: Within Functional Limits   See Function Navigator for Current Functional Status.  [facility] 07/29/2015, 7:40 AM

## 2015-07-29 NOTE — Progress Notes (Signed)
Pt discharged home via wheelchair with family and tech for assistance. Pt/family had no questions or concerns.

## 2015-07-29 NOTE — Progress Notes (Signed)
Caroline Myers is a 82 y.o. female 05-03-1924 [id number]  Subjective: No new complaints. No new problems. Anxious to DC home!  Objective: Vital signs in last 24 hours: Temp:  [97.7 F (36.5 C)-97.8 F (36.6 C)] 97.8 F (36.6 C) (01/28 0504) Pulse Rate:  [71-76] 71 (01/28 0504) Resp:  [16-18] 16 (01/28 0504) BP: (99-110)/(62-66) 110/66 mmHg (01/28 0819) SpO2:  [97 %-99 %] 97 % (01/28 0912) Weight:  [66.2 kg (145 lb 15.1 oz)] 66.2 kg (145 lb 15.1 oz) (01/28 0504) Weight change: 1.2 kg (2 lb 10.3 oz) Last BM Date: 07/28/15  Intake/Output from previous day: 01/27 0701 - 01/28 0700 In: 960 [P.O.:960] Out: -   Physical Exam General: No apparent distress    Lungs: Normal effort. Lungs clear to auscultation, no crackles or wheezes. Cardiovascular: irregular rate and rhythm, no edema  Lab Results: BMET    Component Value Date/Time   NA 141 07/27/2015 0643   K 4.2 07/27/2015 0643   CL 105 07/27/2015 0643   CO2 31 07/27/2015 0643   GLUCOSE 85 07/27/2015 0643   BUN 25* 07/27/2015 0643   CREATININE 0.87 07/27/2015 0643   CREATININE 0.81 11/02/2010 1337   CALCIUM 8.0* 07/27/2015 0643   GFRNONAA 57* 07/27/2015 0643   GFRNONAA >60 11/02/2010 1337   GFRAA >60 07/27/2015 0643   GFRAA >60 11/02/2010 1337   CBC    Component Value Date/Time   WBC 13.6* 07/28/2015 0941   RBC 3.94 07/28/2015 0941   HGB 13.3 07/28/2015 0941   HCT 40.5 07/28/2015 0941   PLT 217 07/28/2015 0941   MCV 102.8* 07/28/2015 0941   MCH 33.8 07/28/2015 0941   MCHC 32.8 07/28/2015 0941   RDW 13.8 07/28/2015 0941   LYMPHSABS 1.3 07/28/2015 0941   MONOABS 1.5* 07/28/2015 0941   EOSABS 0.2 07/28/2015 0941   BASOSABS 0.0 07/28/2015 0941   CBG's (last 3):  No results for input(s): GLUCAP in the last 72 hours. LFT's Lab Results  Component Value Date   ALT 44 07/21/2015   AST 30 07/21/2015   ALKPHOS 67 07/21/2015   BILITOT 0.9 07/21/2015    Studies/Results: Dg Chest Port 1 View  07/28/2015   CLINICAL DATA:  Shortness of breath. EXAM: PORTABLE CHEST 1 VIEW COMPARISON:  July 22, 2015. FINDINGS: Stable cardiomegaly. No pneumothorax is noted. Stable bibasilar opacities are noted most consistent with atelectasis or infiltrate with mild associated pleural effusions. Bony thorax is unremarkable. Degenerative change of the left acromioclavicular joint is noted. IMPRESSION: Stable bibasilar opacities are noted concerning for subsegmental atelectasis or infiltrates with mild associated pleural effusions. Electronically Signed   By: Marijo Conception, M.D.   On: 07/28/2015 12:27    Medications:  I have reviewed the patient's current medications. Scheduled Medications: . acyclovir ointment   Topical Q3H  . amoxicillin-clavulanate  1 tablet Oral Q12H  . apixaban  5 mg Oral BID  . arformoterol  15 mcg Nebulization BID  . B-complex with vitamin C  1 tablet Oral Daily  . budesonide (PULMICORT) nebulizer solution  0.25 mg Nebulization BID  . diltiazem  180 mg Oral Daily  . fluticasone  2 spray Each Nare Daily  . guaiFENesin  1,200 mg Oral BID  . loratadine  10 mg Oral Daily  . nystatin  5 mL Oral QID  . nystatin cream   Topical BID  . saccharomyces boulardii  250 mg Oral BID  . sertraline  50 mg Oral Daily  . white petrolatum  1 application  Topical TID AC & HS   PRN Medications: acetaminophen **OR** acetaminophen, levalbuterol, ondansetron **OR** ondansetron (ZOFRAN) IV, senna-docusate, sorbitol, traZODone, white petrolatum  Assessment/Plan: Principal Problem:   Debility Active Problems:   PNA (pneumonia)   Acute diastolic congestive heart failure (HCC)   Acute on chronic diastolic congestive heart failure (HCC)   Sepsis (HCC)   Hypokalemia   AKI (acute kidney injury) (HCC)   Cough   Anxiety about health  1. Debilitation/acute respiratory failure secondary to sepsis/pneumonia. Continue nebulizer as directed.  s/p CIR; ready for DC home today 2. DVT  Prophylaxis/Anticoagulation: Eliquis to continue 3. Pain Management: Tylenol 4. Mood: Zoloft 50 mg daily. Provide emotional support 5. Neuropsych: This patient is capable of making decisions on her own behalf. 6. Skin/Wound Care: Routine skin checks 7. Fluids/Electrolytes/Nutrition: Routine I&O's Hypokalemia: Resolved, 4.2 on 1/26 8. Atrial fibrillation. Cardiac rate control. Continue Cardizem 180 mg daily 9. Leukocytosis/pneumonia -s/p zosyn, then augmentin, all doses completed -prednisone taper  -WBCs 14.4 on 1/26, trending down, likely secondary to steroids, no fevers chills   10. AKI: Resolved Cr. .87 on 1/26, stable  Cont encourage fluid intake  11. Cough: Improved Encourage patient to use acappella   Length of stay, days: 9   Stable for DC home today as planned - see also DC summary as per PM&R team  Jaymin Waln A. Asa Lente, MD 07/29/2015, 10:05 AM

## 2015-07-31 DIAGNOSIS — K219 Gastro-esophageal reflux disease without esophagitis: Secondary | ICD-10-CM | POA: Diagnosis not present

## 2015-07-31 DIAGNOSIS — J45909 Unspecified asthma, uncomplicated: Secondary | ICD-10-CM | POA: Diagnosis not present

## 2015-07-31 DIAGNOSIS — M150 Primary generalized (osteo)arthritis: Secondary | ICD-10-CM | POA: Diagnosis not present

## 2015-07-31 DIAGNOSIS — F419 Anxiety disorder, unspecified: Secondary | ICD-10-CM | POA: Diagnosis not present

## 2015-07-31 DIAGNOSIS — I509 Heart failure, unspecified: Secondary | ICD-10-CM | POA: Diagnosis not present

## 2015-07-31 DIAGNOSIS — I480 Paroxysmal atrial fibrillation: Secondary | ICD-10-CM | POA: Diagnosis not present

## 2015-07-31 DIAGNOSIS — Z8701 Personal history of pneumonia (recurrent): Secondary | ICD-10-CM | POA: Diagnosis not present

## 2015-07-31 DIAGNOSIS — Z8673 Personal history of transient ischemic attack (TIA), and cerebral infarction without residual deficits: Secondary | ICD-10-CM | POA: Diagnosis not present

## 2015-08-01 ENCOUNTER — Other Ambulatory Visit: Payer: Self-pay | Admitting: *Deleted

## 2015-08-01 NOTE — Patient Outreach (Signed)
[facility]) Care Management  08/01/2015  Caroline Myers [birth year] [id number]    Assessment: Transition of care - week 1 Referral from hospital liaison Caroline Myers) for transition of care call and assess for home visits.  Call placed to patient but the lady who answered the call who identified herself as patient's daughter (Caroline Myers) states that patient is sleeping at this time and unable to be talk to. Patient's daughter informs care management coordinator that [facility] nurse had been in to evaluate patient yesterday and physical therapist will be in tomorrow to work with patient. Patient's daughter stated that primary caregiver for patient is her husband who is 62 years old.  Daughter expressed concern about patient's care since she does not live with them and she is due for a hip surgery herself. She states that placement had been discussed while patient is in the hospital and was decided that patient would be better to discharge home. Daughter was concern about patient's rash to her back and her crotch as well as a wet scab to her nose. Encouraged to call patient's primary care provider to seek advise for it. Daughter mentioned that her mother is not strong enough to go to the doctor's office to be checked at this time. Encouraged daughter to call [facility] nurse who had seen and evaluated patient yesterday to seek treatment interventions if needed from primary care provider. Daughter agreed.  Daughter also mentioned about possibility of getting help or aid to provide care services for patient at home and if medicare insurance benefits would cover for this. Informed daughter that [facility] worker referral can be initiated by care management coordinator to get resources regarding these issues once able to talk with patient and get her approval for referral.   Plan: Will call tomorrow to speak with patient. Will set-up appointment for home visit with  patient.  Florina Glas A. Shi Grose, BSN, RN-BC [place] Management Coordinator Cell: [phone]

## 2015-08-02 ENCOUNTER — Emergency Department (HOSPITAL_COMMUNITY): Payer: Medicare Other

## 2015-08-02 ENCOUNTER — Other Ambulatory Visit: Payer: Self-pay | Admitting: *Deleted

## 2015-08-02 ENCOUNTER — Emergency Department (HOSPITAL_COMMUNITY)
Admission: EM | Admit: 2015-08-02 | Discharge: 2015-08-02 | Disposition: A | Discharge status: Home / Self Care | Payer: Medicare Other | Attending: Emergency Medicine | Admitting: Emergency Medicine

## 2015-08-02 ENCOUNTER — Encounter (HOSPITAL_COMMUNITY): Payer: Self-pay | Admitting: Emergency Medicine

## 2015-08-02 DIAGNOSIS — R1111 Vomiting without nausea: Secondary | ICD-10-CM | POA: Diagnosis not present

## 2015-08-02 DIAGNOSIS — M150 Primary generalized (osteo)arthritis: Secondary | ICD-10-CM | POA: Diagnosis not present

## 2015-08-02 DIAGNOSIS — K219 Gastro-esophageal reflux disease without esophagitis: Secondary | ICD-10-CM | POA: Insufficient documentation

## 2015-08-02 DIAGNOSIS — J90 Pleural effusion, not elsewhere classified: Secondary | ICD-10-CM | POA: Diagnosis not present

## 2015-08-02 DIAGNOSIS — R109 Unspecified abdominal pain: Secondary | ICD-10-CM | POA: Diagnosis present

## 2015-08-02 DIAGNOSIS — F419 Anxiety disorder, unspecified: Secondary | ICD-10-CM | POA: Diagnosis not present

## 2015-08-02 DIAGNOSIS — J45909 Unspecified asthma, uncomplicated: Secondary | ICD-10-CM | POA: Insufficient documentation

## 2015-08-02 DIAGNOSIS — Z8673 Personal history of transient ischemic attack (TIA), and cerebral infarction without residual deficits: Secondary | ICD-10-CM | POA: Diagnosis not present

## 2015-08-02 DIAGNOSIS — I509 Heart failure, unspecified: Secondary | ICD-10-CM | POA: Diagnosis not present

## 2015-08-02 DIAGNOSIS — Z79899 Other long term (current) drug therapy: Secondary | ICD-10-CM | POA: Insufficient documentation

## 2015-08-02 DIAGNOSIS — Z85828 Personal history of other malignant neoplasm of skin: Secondary | ICD-10-CM | POA: Diagnosis not present

## 2015-08-02 DIAGNOSIS — R1011 Right upper quadrant pain: Secondary | ICD-10-CM | POA: Diagnosis not present

## 2015-08-02 DIAGNOSIS — K2970 Gastritis, unspecified, without bleeding: Secondary | ICD-10-CM | POA: Diagnosis not present

## 2015-08-02 DIAGNOSIS — Z8701 Personal history of pneumonia (recurrent): Secondary | ICD-10-CM | POA: Diagnosis not present

## 2015-08-02 DIAGNOSIS — K5730 Diverticulosis of large intestine without perforation or abscess without bleeding: Secondary | ICD-10-CM | POA: Diagnosis not present

## 2015-08-02 DIAGNOSIS — I480 Paroxysmal atrial fibrillation: Secondary | ICD-10-CM | POA: Diagnosis not present

## 2015-08-02 LAB — CBC WITH DIFFERENTIAL/PLATELET
Basophils Absolute: 0 10*3/uL (ref 0.0–0.1)
Basophils Relative: 0 %
Eosinophils Absolute: 0.1 10*3/uL (ref 0.0–0.7)
Eosinophils Relative: 1 %
HCT: 36.8 % (ref 36.0–46.0)
HEMOGLOBIN: 11.9 g/dL — AB (ref 12.0–15.0)
LYMPHS PCT: 5 %
Lymphs Abs: 0.7 10*3/uL (ref 0.7–4.0)
MCH: 33 pg (ref 26.0–34.0)
MCHC: 32.3 g/dL (ref 30.0–36.0)
MCV: 101.9 fL — AB (ref 78.0–100.0)
Monocytes Absolute: 0.3 10*3/uL (ref 0.1–1.0)
Monocytes Relative: 2 %
NEUTROS PCT: 92 %
Neutro Abs: 12 10*3/uL — ABNORMAL HIGH (ref 1.7–7.7)
Platelets: 217 10*3/uL (ref 150–400)
RBC: 3.61 MIL/uL — AB (ref 3.87–5.11)
RDW: 14.1 % (ref 11.5–15.5)
WBC: 13.1 10*3/uL — AB (ref 4.0–10.5)

## 2015-08-02 LAB — URINALYSIS, ROUTINE W REFLEX MICROSCOPIC
Bilirubin Urine: NEGATIVE
GLUCOSE, UA: NEGATIVE mg/dL
Hgb urine dipstick: NEGATIVE
Ketones, ur: NEGATIVE mg/dL
LEUKOCYTES UA: NEGATIVE
Nitrite: NEGATIVE
PROTEIN: NEGATIVE mg/dL
SPECIFIC GRAVITY, URINE: 1.019 (ref 1.005–1.030)
pH: 5.5 (ref 5.0–8.0)

## 2015-08-02 LAB — I-STAT TROPONIN, ED: Troponin i, poc: 0.08 ng/mL (ref 0.00–0.08)

## 2015-08-02 LAB — COMPREHENSIVE METABOLIC PANEL
ALK PHOS: 91 U/L (ref 38–126)
ALT: 23 U/L (ref 14–54)
AST: 21 U/L (ref 15–41)
Albumin: 2.7 g/dL — ABNORMAL LOW (ref 3.5–5.0)
Anion gap: 10 (ref 5–15)
BUN: 15 mg/dL (ref 6–20)
CALCIUM: 8.2 mg/dL — AB (ref 8.9–10.3)
CO2: 24 mmol/L (ref 22–32)
CREATININE: 0.8 mg/dL (ref 0.44–1.00)
Chloride: 101 mmol/L (ref 101–111)
Glucose, Bld: 105 mg/dL — ABNORMAL HIGH (ref 65–99)
Potassium: 3.6 mmol/L (ref 3.5–5.1)
SODIUM: 135 mmol/L (ref 135–145)
Total Bilirubin: 0.8 mg/dL (ref 0.3–1.2)
Total Protein: 5.5 g/dL — ABNORMAL LOW (ref 6.5–8.1)

## 2015-08-02 LAB — LIPASE, BLOOD: LIPASE: 25 U/L (ref 11–51)

## 2015-08-02 LAB — TROPONIN I: TROPONIN I: 0.05 ng/mL — AB (ref <0.031)

## 2015-08-02 MED ORDER — ONDANSETRON HCL 4 MG/2ML IJ SOLN: 4.0000 mg | Freq: Once | INTRAMUSCULAR | Status: DC | PRN

## 2015-08-02 MED ORDER — ACETAMINOPHEN 325 MG PO TABS
650.0000 mg | ORAL_TABLET | Freq: Once | ORAL | Status: AC
  Administered 2015-08-02: 650 mg via ORAL
  Filled 2015-08-02: qty 2

## 2015-08-02 MED ORDER — BUDESONIDE-FORMOTEROL FUMARATE 160-4.5 MCG/ACT IN AERO: 2.0000 | INHALATION_SPRAY | Freq: Two times a day (BID) | RESPIRATORY_TRACT | Status: DC

## 2015-08-02 MED ORDER — IOHEXOL 300 MG/ML  SOLN
80.0000 mL | Freq: Once | INTRAMUSCULAR | Status: AC | PRN
  Administered 2015-08-02: 80 mL via INTRAVENOUS

## 2015-08-02 MED ORDER — TRAMADOL HCL 50 MG PO TABS: 50.0000 mg | ORAL_TABLET | Freq: Four times a day (QID) | ORAL | Status: DC | PRN

## 2015-08-02 NOTE — ED Notes (Signed)
Dr. Ward at bedside.

## 2015-08-02 NOTE — ED Provider Notes (Signed)
CHIEF COMPLAINT: Chest pain, abdominal pain  HPI: Pt is a 82 y.o. female with history of mild aortic stenosis, asthma, paroxysmal atrial fibrillation on Eliquis who presents to the emergency department with complaints of chest and abdominal pain. States that the pain woke her from sleep. States that it started in her chest and moved into her upper mid abdomen. She is unable to describe the pain or tell me if there are any aggravating or relieving factors. She was recently admitted to the hospital for pneumonia. She denies any new shortness of breath. Denies any fever. States she did have nausea but no vomiting. Has had some diarrhea. Denies history of similar symptoms.  ROS: See HPI Constitutional: no fever  Eyes: no drainage  ENT: no runny nose   Cardiovascular:   chest pain  Resp: no SOB  GI: no vomiting GU: no dysuria Integumentary: no rash  Allergy: no hives  Musculoskeletal: no leg swelling  Neurological: no slurred speech ROS otherwise negative  PAST MEDICAL HISTORY/PAST SURGICAL HISTORY:  Past Medical History  Diagnosis Date  . Allergy     allergic rhinitis  . Cancer [facility])     history of skin cancer of the nose  . Asthma 2000    mild. tried Advair after bronchospasm after exposure to cats  . Shoulder pain, left 05/2010    with rotator cuff tear--Dr Alfonso Ramus  . Paroxysmal atrial fibrillation (HCC)     pt does not want anticoagulation  . Aortic stenosis     EF normal.  Mild aortic stenosis. Mild to moderate aortic regurgitation  . Anxiety   . Shortness of breath     occ  . GERD (gastroesophageal reflux disease)     occ  . Arthritis   . Stroke [facility])     MEDICATIONS:  Prior to Admission medications   Medication Sig Start Date End Date Taking? Authorizing Provider  acyclovir cream (ZOVIRAX) 5 % Apply 1 application topically every 4 (four) hours. 07/28/15   Lavon Paganini Angiulli, PA-C  ALPRAZolam Duanne Moron) 0.25 MG tablet Take 1 tablet (0.25 mg total) by mouth 2 (two) times  daily as needed for anxiety. 07/28/15   Lavon Paganini Angiulli, PA-C  apixaban (ELIQUIS) 5 MG TABS tablet Take 1 tablet (5 mg total) by mouth 2 (two) times daily. 07/28/15   Daniel J Angiulli, PA-C  B Complex-C (B-COMPLEX WITH VITAMIN C) tablet Take 1 tablet by mouth daily.    Historical Provider, MD  budesonide-formoterol (SYMBICORT) 80-4.5 MCG/ACT inhaler Inhale 2 puffs into the lungs 2 (two) times daily. 07/28/15   Lavon Paganini Angiulli, PA-C  diltiazem (CARDIZEM CD) 180 MG 24 hr capsule Take 1 capsule (180 mg total) by mouth daily. 07/28/15   Lavon Paganini Angiulli, PA-C  guaiFENesin (MUCINEX) 600 MG 12 hr tablet Take 2 tablets (1,200 mg total) by mouth 2 (two) times daily. 07/28/15   Daniel J Angiulli, PA-C  guaiFENesin-dextromethorphan (ROBITUSSIN DM) 100-10 MG/5ML syrup Take 5 mLs by mouth every 4 (four) hours as needed for cough. 07/20/15   Hosie Poisson, MD  loratadine (CLARITIN) 10 MG tablet Take 1 tablet (10 mg total) by mouth daily. 07/20/15   Hosie Poisson, MD  nystatin cream (MYCOSTATIN) Apply topically 2 (two) times daily. Applied affected area twice daily until resolved 07/28/15   Lavon Paganini Angiulli, PA-C  PROAIR HFA 108 [phone] Base) MCG/ACT inhaler Inhale 1-2 puffs into the lungs every 4 (four) hours as needed for wheezing or shortness of breath. 07/28/15   Lavon Paganini [place],  PA-C  saccharomyces boulardii (FLORASTOR) 250 MG capsule Take 1 capsule (250 mg total) by mouth 2 (two) times daily. 07/28/15   Daniel J Angiulli, PA-C  senna-docusate (SENOKOT-S) 8.6-50 MG tablet Take 1 tablet by mouth at bedtime as needed for mild constipation or moderate constipation. 07/20/15   Hosie Poisson, MD  sertraline (ZOLOFT) 50 MG tablet Take 1 tablet (50 mg total) by mouth daily. 07/28/15   Lavon Paganini Angiulli, PA-C    ALLERGIES:  Allergies  Allergen Reactions  . Tape Other (See Comments)    Rips skin    SOCIAL HISTORY:  Social History  Substance Use Topics  . Smoking status: Never Smoker   . Smokeless tobacco: Never Used      Comment: occ wine  . Alcohol Use: 0.0 oz/week    0 Standard drinks or equivalent per week     Comment: OCC    FAMILY HISTORY: Family History  Problem Relation Age of Onset  . Heart attack Neg Hx   . Stroke Sister   . Hypertension Neg Hx     EXAM: BP 95/80 mmHg  Pulse 95  Temp(Src) 98 F (36.7 C) (Oral)  Resp 20  SpO2 98% CONSTITUTIONAL: Alert and oriented and responds appropriately to questions. Elderly, chronically ill-appearing HEAD: Normocephalic EYES: Conjunctivae clear, PERRL ENT: normal nose; no rhinorrhea; moist mucous membranes; pharynx without lesions noted, scabbed lesion to the right nostril NECK: Supple, no meningismus, no LAD  CARD: RRR; S1 and S2 appreciated; no murmurs, no clicks, no rubs, no gallops RESP: Normal chest excursion without splinting or tachypnea; breath sounds clear and equal bilaterally; no wheezes, no rhonchi, no rales, no hypoxia or respiratory distress, speaking full sentences ABD/GI: Normal bowel sounds; non-distended; soft, tender diffusely throughout the abdomen but mostly in the upper abdomen. Negative Murphy sign. No guarding or rebound. No peritoneal signs. BACK:  The back appears normal and is non-tender to palpation, there is no CVA tenderness EXT: Normal ROM in all joints; non-tender to palpation; no edema; normal capillary refill; no cyanosis, no calf tenderness or swelling    SKIN: Normal color for age and race; warm NEURO: Moves all extremities equally, sensation to light touch intact diffusely, cranial nerves II through XII intact PSYCH: The patient's mood and manner are appropriate. Grooming and personal hygiene are appropriate.  MEDICAL DECISION MAKING: Patient here with abdominal pain. States it started as chest pain and then moved to her abdomen. Diffusely tender throughout the abdomen mostly in the upper abdomen. We'll proceed with CT imaging to dilate for cholecystitis, colitis, diverticulitis, appendicitis, bowel  obstruction, pancreatitis. We'll give Tylenol for pain and she refuses anything else. We'll give Zofran for nausea. She did also have an episode of chest pain which she is unable to describe it is now gone. EKG shows no new ischemic changes. We'll obtain troponin, chest x-ray.  ED PROGRESS: Patient's labs do show leukocytosis with left shift. This has been since her discharge and appears to be improving. LFTs and lipase normal. His have mildly elevated troponin but does appear to have a troponin leak. Urine shows no sign of infection. Chest x-ray shows mild interstitial prominence bilaterally without pulmonary edema. She does have history keep bilateral basilar atelectasis versus infiltrate on the left greater than right. She is having any cough currently. Denies any fever. Denies any new shortness of breath. I think this is likely atelectasis. CT scan shows infiltrative changes around the first and proximal second portions of the duodenum as well as the gallbladder  that could be related to ascites, peptic ulcer disease or cholecystitis. She also has abnormal attenuation in the pancreatic head and they have recommended nonemergent outpatient MRI. We'll perform ultrasound of her gallbladder for further evaluation given she is having right upper quadrant abdominal pain on exam. Have updated patient with this plan. Signed out to Dr. Rogene Houston who will follow-up on patient and ultrasound. She is still hemodynamically stable. She does have some slightly low blood pressures but this appears to be her baseline.      EKG Interpretation  Date/Time:  Wednesday August 02 2015 06:35:41 EST Ventricular Rate:  88 PR Interval:    QRS Duration: 92 QT Interval:  401 QTC Calculation: 485 R Axis:   -54 Text Interpretation:  Atrial fibrillation Left anterior fascicular block Probable anteroseptal infarct, old No significant change since last tracing Confirmed by Sharena Dibenedetto,  DO, Nakaya Mishkin ([id number]) on 08/02/2015 7:43:24 AM         Dana, DO 08/02/15 [phone]

## 2015-08-02 NOTE — Patient Outreach (Addendum)
[facility]) Care Management  08/02/2015  PRARTHANA MAZZARO Oct 27, 1923 [id number]   Assessment: Care coordination/ transition of care follow-up Received staff message from care management assistant this afternoon stating to call patient since husband called nurse line today. Call placed and spoke to patient's husband Herbie [place]) and told care management coordinator that he called early this morning. He states that patient's "problem is taken cared of". Patient was brought to the emergency room this morning due to complaint of abdominal pain and nausea. Patient's husband states "all tests done in the emergency room were okay so she was sent home" as stated.  Per husband, patient is "in bed resting" since she is "too tired from today" and not a good time to talk with him either. He told care management coordinator to call tomorrow around noon.   Plan: Will call patient tomorrow per husband's request. Will attempt to set-up schedule for home visit.  Jleigh Striplin A. Jak Haggar, BSN, RN-BC [place] Management Coordinator Cell: [phone]

## 2015-08-02 NOTE — Discharge Instructions (Signed)
Extensive workup here in the emergency department without acute findings to warrant admission. There is some evidence of inflammation around the first part of the small intestines at the stomach that could perhaps represent a stomach ulcer. Also could represent swelling of the head of the pancreas. Liver function tests and lipase are normal. Return for any new or worse symptoms. Take the tramadol as needed. Make an appointment to follow-up with your regular doctor.

## 2015-08-02 NOTE — ED Notes (Signed)
PER [name]: Patient to ED from home c/o sudden onset RLQ abdominal pain and N/V that woke her up at 0400 this morning - reports "spitting up" before EMS arrival. Tender upon palpation. Patient was recently d/c from hospital on Saturday after being admitted for PNA. SOB with exertion, sore to R nose noted (EMS reports this is from her nasal cannula while she was admitted). 22g. R hand. EMS VS: 124/96, HR 100 A Flutter, 95% RA - put on 2L O2  by EMS for comfort. Pt A&O x 4, respirations e/u at this time.

## 2015-08-02 NOTE — Consult Note (Signed)
   [facility] CM Inpatient Consult   08/02/2015  ALAYSA DIGILIO 1923-12-22 [id number] This morning, RNCM received a voicemail message from the patient's daughter [name] regarding some care options.  Return call to daughter requesting a return call. Also, notified [facility] RNCM of patient in the Emergency Room. Patient just recently active with [facility] Care Management. Natividad Brood, RN BSN [facility] Liaison  [phone] business mobile phone Toll free office [phone]

## 2015-08-02 NOTE — ED Provider Notes (Signed)
Results for orders placed or performed during the hospital encounter of 08/02/15  CBC with Differential  Result Value Ref Range   WBC 13.1 (H) 4.0 - 10.5 K/uL   RBC 3.61 (L) 3.87 - 5.11 MIL/uL   Hemoglobin 11.9 (L) 12.0 - 15.0 g/dL   HCT 36.8 36.0 - 46.0 %   MCV 101.9 (H) 78.0 - 100.0 fL   MCH 33.0 26.0 - 34.0 pg   MCHC 32.3 30.0 - 36.0 g/dL   RDW 14.1 11.5 - 15.5 %   Platelets 217 150 - 400 K/uL   Neutrophils Relative % 92 %   Neutro Abs 12.0 (H) 1.7 - 7.7 K/uL   Lymphocytes Relative 5 %   Lymphs Abs 0.7 0.7 - 4.0 K/uL   Monocytes Relative 2 %   Monocytes Absolute 0.3 0.1 - 1.0 K/uL   Eosinophils Relative 1 %   Eosinophils Absolute 0.1 0.0 - 0.7 K/uL   Basophils Relative 0 %   Basophils Absolute 0.0 0.0 - 0.1 K/uL  Comprehensive metabolic panel  Result Value Ref Range   Sodium 135 135 - 145 mmol/L   Potassium 3.6 3.5 - 5.1 mmol/L   Chloride 101 101 - 111 mmol/L   CO2 24 22 - 32 mmol/L   Glucose, Bld 105 (H) 65 - 99 mg/dL   BUN 15 6 - 20 mg/dL   Creatinine, Ser 0.80 0.44 - 1.00 mg/dL   Calcium 8.2 (L) 8.9 - 10.3 mg/dL   Total Protein 5.5 (L) 6.5 - 8.1 g/dL   Albumin 2.7 (L) 3.5 - 5.0 g/dL   AST 21 15 - 41 U/L   ALT 23 14 - 54 U/L   Alkaline Phosphatase 91 38 - 126 U/L   Total Bilirubin 0.8 0.3 - 1.2 mg/dL   GFR calc non Af Amer >60 >60 mL/min   GFR calc Af Amer >60 >60 mL/min   Anion gap 10 5 - 15  Lipase, blood  Result Value Ref Range   Lipase 25 11 - 51 U/L  Urinalysis, Routine w reflex microscopic (not at [facility])  Result Value Ref Range   Color, Urine YELLOW YELLOW   APPearance CLEAR CLEAR   Specific Gravity, Urine 1.019 1.005 - 1.030   pH 5.5 5.0 - 8.0   Glucose, UA NEGATIVE NEGATIVE mg/dL   Hgb urine dipstick NEGATIVE NEGATIVE   Bilirubin Urine NEGATIVE NEGATIVE   Ketones, ur NEGATIVE NEGATIVE mg/dL   Protein, ur NEGATIVE NEGATIVE mg/dL   Nitrite NEGATIVE NEGATIVE   Leukocytes, UA NEGATIVE NEGATIVE  Troponin I  Result Value Ref Range   Troponin I 0.05 (H)  <0.031 ng/mL  I-stat troponin, ED  Result Value Ref Range   Troponin i, poc 0.08 0.00 - 0.08 ng/mL   Comment 3           Dg Chest 2 View  08/02/2015  CLINICAL DATA:  Right side chest pain, abdominal pain EXAM: CHEST  2 VIEW COMPARISON:  07/28/2015 FINDINGS: Cardiomegaly again noted. Mild interstitial prominence bilateral without convincing pulmonary edema. Mild thoracic dextroscoliosis. Mild degenerative changes thoracic spine. Tiny bilateral pleural effusion. Streaky bilateral basilar atelectasis or infiltrate left greater than right. IMPRESSION: Cardiomegaly. Mild interstitial prominence bilaterally without pulmonary edema. Tiny bilateral pleural effusion. Streaky bilateral basilar atelectasis or infiltrate left greater than right. Electronically Signed   By: Lahoma Crocker M.D.   On: 08/02/2015 08:05   Dg Chest 2 View  07/22/2015  CLINICAL DATA:  Pneumonia, coughing for 2 weeks EXAM: CHEST  2 VIEW  COMPARISON:  07/19/2015 FINDINGS: Cardiomegaly again noted. Dextroscoliosis thoracic spine. No pulmonary edema. There is bilateral small pleural effusion with bilateral basilar atelectasis or infiltrate right greater than left. IMPRESSION: No pulmonary edema. Cardiomegaly. Thoracic dextroscoliosis. Persistent bilateral small pleural effusion with bilateral basilar atelectasis or infiltrate right greater than left. Electronically Signed   By: Lahoma Crocker M.D.   On: 07/22/2015 13:36   Dg Chest 2 View  07/19/2015  CLINICAL DATA:  Pneumonia, shortness of breath, sepsis and weakness. EXAM: CHEST - 2 VIEW COMPARISON:  07/16/2005 FINDINGS: Increased consolidative changes at the lung bases noted, left greater than right. Associated small bilateral pleural effusions are present and findings are consistent with bilateral pneumonia. No pulmonary edema or pneumothorax identified. There is stable cardiac enlargement. The bony thorax is unremarkable. IMPRESSION: Worsening bilateral lower lobe pneumonia, left greater than  right, with associated small bilateral pleural effusions. Electronically Signed   By: Aletta Edouard M.D.   On: 07/19/2015 17:02   Ct Abdomen Pelvis W Contrast  08/02/2015  CLINICAL DATA:  Abdominal pain all over since last Saturday, nausea, history asthma, paroxysmal atrial fibrillation, GERD, CHF, benign essential hypertension, prior stroke EXAM: CT ABDOMEN AND PELVIS WITH CONTRAST TECHNIQUE: Multidetector CT imaging of the abdomen and pelvis was performed using the standard protocol following bolus administration of intravenous contrast. Sagittal and coronal MPR images reconstructed from axial data set. CONTRAST:  49mL OMNIPAQUE IOHEXOL 300 MG/ML SOLN IV. No oral contrast administered. COMPARISON:  None FINDINGS: Bibasilar small pleural effusions and atelectasis. LEFT renal cyst 3.5 x 2.5 cm image 28. Question subtle nodularity of hepatic margins cannot exclude cirrhosis. Liver, spleen, kidneys, and adrenal glands otherwise normal. Pancreatic body and tail normal appearance. Pancreatic head shows a region of lower attenuation, approximately 2 cm diameter, uncertain etiology; this could represent subtle edema, subtle mass, less likely few tiny cysts. Infiltrative changes are seen adjacent to the duodenal bulb and proximal descending duodenum as well as surrounding the adjacent gallbladder. These findings could reflect a regional inflammatory process, peptic ulcer disease or cholecystitis. No extraluminal gas collection seen to suggest perforated abscess. Low-attenuation free intraperitoneal fluid is seen perihepatic and in pelvis. Diverticulosis of descending and sigmoid colon without acute diverticulitis changes. Stomach and remaining bowel loops otherwise normal. Normal appendix. Decompressed bladder, ureters, uterus and adnexa unremarkable. Scattered atherosclerotic calcifications without discrete aneurysm. Tiny ventral hernia containing fat in epigastrium. No mass, adenopathy, free intraperitoneal air or  acute bone lesion. Extensive degenerative disc and facet disease changes lumbar spine. Question Tarlov cysts within sacral spinal canal. IMPRESSION: Free intraperitoneal fluid without free air. Infiltrative changes adjacent to the first and proximal second portions the duodenum as well as the adjacent gallbladder; these are nonspecific, could be related to noted ascites, peptic ulcer disease, cholecystitis or other regional inflammatory process ; consider RIGHT upper quadrant abdominal ultrasound to assess. Distal colonic diverticulosis without evidence of diverticulitis. Bibasilar small pleural effusions and atelectasis. Cannot exclude cirrhotic liver. Questionable area of abnormal attenuation within the pancreatic head, cannot exclude mass lesions; recommend correlation with serum amylase and followup non emergent MR imaging of the abdomen with and without contrast following resolution of the patient's current acute process to assess, when patient is able to more fully cooperate with optimal imaging. Electronically Signed   By: Lavonia Dana M.D.   On: 08/02/2015 09:00   Dg Chest Port 1 View  07/28/2015  CLINICAL DATA:  Shortness of breath. EXAM: PORTABLE CHEST 1 VIEW COMPARISON:  July 22, 2015. FINDINGS: Stable cardiomegaly.  No pneumothorax is noted. Stable bibasilar opacities are noted most consistent with atelectasis or infiltrate with mild associated pleural effusions. Bony thorax is unremarkable. Degenerative change of the left acromioclavicular joint is noted. IMPRESSION: Stable bibasilar opacities are noted concerning for subsegmental atelectasis or infiltrates with mild associated pleural effusions. Electronically Signed   By: Marijo Conception, M.D.   On: 07/28/2015 12:27   Dg Chest Port 1 View  07/17/2015  CLINICAL DATA:  Shortness of breath EXAM: PORTABLE CHEST 1 VIEW COMPARISON:  07/16/2015 FINDINGS: Cardiomegaly. Small bilateral pleural effusions with bibasilar atelectasis. No overt edema. No  acute bony abnormality. Degenerative changes in the shoulders and thoracic spine. Rightward scoliosis of the thoracic spine. IMPRESSION: Cardiomegaly. Small bilateral pleural effusions with bibasilar atelectasis. Electronically Signed   By: Rolm Baptise M.D.   On: 07/17/2015 10:18   Portable Chest 1 View  07/16/2015  CLINICAL DATA:  Pneumonia, history asthma, paroxysmal atrial fibrillation, stroke, GERD EXAM: PORTABLE CHEST 1 VIEW COMPARISON:  Portable exam 0541 hours compared to 07/15/2015 FINDINGS: RIGHT costophrenic angle excluded. Enlargement of cardiac silhouette. Atherosclerotic calcification aorta. Minimal pulmonary vascular congestion. Chronic central peribronchial thickening unchanged. Question atelectasis versus infiltrate in LEFT lower lobe. Remaining lungs clear. No gross pleural effusion or pneumothorax. Bones demineralized with chronic RIGHT rotator cuff tear. IMPRESSION: Enlargement of cardiac silhouette with pulmonary vascular congestion. Bronchitic changes with questionable infiltrate versus atelectasis in LEFT lower lobe. Electronically Signed   By: Lavonia Dana M.D.   On: 07/16/2015 09:40   Dg Chest Portable 1 View  07/15/2015  CLINICAL DATA:  82 year old female with shortness of breath EXAM: PORTABLE CHEST 1 VIEW COMPARISON:  Chest radiograph dated 02/14/2015 FINDINGS: Single-view of the chest demonstrates stable cardiomegaly. No focal consolidation, pleural effusion, or pneumothorax. Osteopenia with degenerative changes of the spine. No acute fracture. IMPRESSION: No active disease. Electronically Signed   By: Anner Crete M.D.   On: 07/15/2015 06:06   US Abdomen Limited Ruq  08/02/2015  CLINICAL DATA:  Right upper quadrant pain for 2 weeks. EXAM: US ABDOMEN LIMITED - RIGHT UPPER QUADRANT COMPARISON:  None. FINDINGS: Gallbladder: No gallstones or wall thickening visualized. Gallbladder wall measures 1.9 mm. No sonographic Murphy sign noted by sonographer. Common bile duct: Diameter:  2.8 mm. Liver: No focal lesion identified. Within normal limits in parenchymal echogenicity. There is a small right pleural effusion. IMPRESSION: Small right pleural effusion. Normal sonographic appearance of the gallbladder and liver. Electronically Signed   By: Fidela Salisbury M.D.   On: 08/02/2015 10:38    Patient ultrasound CT scan of the abdomen with some subtle findings around the first part of the duodenum and pancreatic head and gallbladder ultrasound ruled out any gallbladder abnormalities at all. Patient's liver function test are normal. Patient's old socks and lab findings remain essentially baseline. Some concern about the elevated troponin but she had markedly elevated troponins during her recent admission for sepsis. Current troponins are essentially cut in half. Patient really without a story consistent with an acute cardiac event. She's had some abdominal discomfort since Saturday on and off. She does have some mild tenderness in the right upper quadrant was does lead some credence to the inflammation seen on the CT scan. However this most likely does not require admission or warrant admission. Patient is okay with going home will return for any new or worse symptoms. Follow-up with her regular doctor determine whether they want to do an MRI of the area. Whether they want to do an additional workup  for possible peptic ulcer disease. Patient's intestinal bowel problems that she's complained about since Saturday could be related to a cup floral changes after being on the antibiotics for the septic admission. And that would also may be explained in essentially negative CT scan. There is no evidence of any small bowel or large bowel inflammation. Do not feel the patient has anything that's consistent with a C. difficile infection at this point in time. But that could develop as time goes on.  Patient will be discharged home. Close follow-up with her primary care doctor and returning for any new  or worse symptoms.  Fredia Sorrow, MD 08/02/15 1158

## 2015-08-02 NOTE — ED Notes (Signed)
Patient placed on bedpan, but unable to urinate at this time. Will try again in a little bit.

## 2015-08-03 ENCOUNTER — Other Ambulatory Visit: Payer: Self-pay | Admitting: *Deleted

## 2015-08-03 NOTE — Patient Outreach (Addendum)
[place] [facility]) Care Management  08/03/2015  Caroline Myers 10/16/1923 [id number]   Assessment: Transition of care follow-up call- week 1 Called back to speak with patient as requested by husband yesterday but patient is just waking up from a nap per husband's report. Husband states that "this is not a good time to talk to her, just call back later", as stated. Care management coordinator told husband that will try again later and he agreed.   Plan: Will try to call back at a later time today.    Addendum: Call placed to speak with patient (as requested by husband earlier) but no answer, phone continues to ring and unable to leave a message since no answering machine set-up.   Plan: Will schedule for next outreach call.  Cruz Devilla A. Adin Laker, BSN, RN-BC [place] Management Coordinator Cell: [phone]

## 2015-08-04 DIAGNOSIS — Z8673 Personal history of transient ischemic attack (TIA), and cerebral infarction without residual deficits: Secondary | ICD-10-CM | POA: Diagnosis not present

## 2015-08-04 DIAGNOSIS — I509 Heart failure, unspecified: Secondary | ICD-10-CM | POA: Diagnosis not present

## 2015-08-04 DIAGNOSIS — Z8701 Personal history of pneumonia (recurrent): Secondary | ICD-10-CM | POA: Diagnosis not present

## 2015-08-04 DIAGNOSIS — J45909 Unspecified asthma, uncomplicated: Secondary | ICD-10-CM | POA: Diagnosis not present

## 2015-08-04 DIAGNOSIS — I480 Paroxysmal atrial fibrillation: Secondary | ICD-10-CM | POA: Diagnosis not present

## 2015-08-04 DIAGNOSIS — M150 Primary generalized (osteo)arthritis: Secondary | ICD-10-CM | POA: Diagnosis not present

## 2015-08-08 ENCOUNTER — Encounter: Payer: Self-pay | Admitting: *Deleted

## 2015-08-08 ENCOUNTER — Other Ambulatory Visit: Payer: Self-pay | Admitting: *Deleted

## 2015-08-08 DIAGNOSIS — I480 Paroxysmal atrial fibrillation: Secondary | ICD-10-CM | POA: Diagnosis not present

## 2015-08-08 DIAGNOSIS — I509 Heart failure, unspecified: Secondary | ICD-10-CM | POA: Diagnosis not present

## 2015-08-08 DIAGNOSIS — Z8701 Personal history of pneumonia (recurrent): Secondary | ICD-10-CM | POA: Diagnosis not present

## 2015-08-08 DIAGNOSIS — Z8673 Personal history of transient ischemic attack (TIA), and cerebral infarction without residual deficits: Secondary | ICD-10-CM | POA: Diagnosis not present

## 2015-08-08 DIAGNOSIS — J45909 Unspecified asthma, uncomplicated: Secondary | ICD-10-CM | POA: Diagnosis not present

## 2015-08-08 DIAGNOSIS — M150 Primary generalized (osteo)arthritis: Secondary | ICD-10-CM | POA: Diagnosis not present

## 2015-08-08 NOTE — Patient Outreach (Signed)
[place] [facility]) Care Management  08/08/2015  Caroline Myers [birth year] [id number]   Assessment: Transition of care follow-up call- week 2 Call placed to follow-up transition of care and was able to speak with patient's husband - Herbie Baltimore (primary caregiver/emergency contact). He apologized for not being able to get patient on the phone since she is not up to talking yet but is made aware of conversations as stated.  Patient's husband reports that [place] services are in place at this time. He mentions that physical therapist, occupational therapist, nurse and bath aide are currently working with patient. She is getting exercises to strengthen her and assistance with baths which is very important to her, per husband's report.  According to husband, patient has medications she needs and taking as prescribed.  Patient has an appointment scheduled to see Dr. Harrington Challenger on 08/11/15 for hospital follow-up and transportation will be provided by husband or daughter.  Patient's husband informs care management coordinator that patient is getting all the help that she needs at the moment and is responding well to it. Husband states, "I don't think that we need anymore services in addition, for now". He states, "we appreciate the help and services you're offering" but don't want patient to be overwhelmed with all these services coming from different directions.  Patient's husband was made aware of the eligibility to [facility] program and other [facility] services that can be provided to patient but he was insistent to opt of discontinuing the services for now. He states that they have the packet of information about [facility] care management given in the hospital in case they will need to call back. Patient's husband was thankful of the efforts provided and states they will call as necessary or when services will be needed. He agreed to close case and to make primary care provider aware of it.    Plan: Will  close case. Will notify primary care provider of case closure.   Brodey Bonn A. Deserie Dirks, BSN, RN-BC [place] Management Coordinator Cell: [phone]

## 2015-08-09 DIAGNOSIS — J45909 Unspecified asthma, uncomplicated: Secondary | ICD-10-CM | POA: Diagnosis not present

## 2015-08-09 DIAGNOSIS — I480 Paroxysmal atrial fibrillation: Secondary | ICD-10-CM | POA: Diagnosis not present

## 2015-08-09 DIAGNOSIS — M150 Primary generalized (osteo)arthritis: Secondary | ICD-10-CM | POA: Diagnosis not present

## 2015-08-09 DIAGNOSIS — Z8701 Personal history of pneumonia (recurrent): Secondary | ICD-10-CM | POA: Diagnosis not present

## 2015-08-09 DIAGNOSIS — Z8673 Personal history of transient ischemic attack (TIA), and cerebral infarction without residual deficits: Secondary | ICD-10-CM | POA: Diagnosis not present

## 2015-08-09 DIAGNOSIS — I509 Heart failure, unspecified: Secondary | ICD-10-CM | POA: Diagnosis not present

## 2015-08-10 DIAGNOSIS — Z8701 Personal history of pneumonia (recurrent): Secondary | ICD-10-CM | POA: Diagnosis not present

## 2015-08-10 DIAGNOSIS — I509 Heart failure, unspecified: Secondary | ICD-10-CM | POA: Diagnosis not present

## 2015-08-10 DIAGNOSIS — J45909 Unspecified asthma, uncomplicated: Secondary | ICD-10-CM | POA: Diagnosis not present

## 2015-08-10 DIAGNOSIS — Z8673 Personal history of transient ischemic attack (TIA), and cerebral infarction without residual deficits: Secondary | ICD-10-CM | POA: Diagnosis not present

## 2015-08-10 DIAGNOSIS — M150 Primary generalized (osteo)arthritis: Secondary | ICD-10-CM | POA: Diagnosis not present

## 2015-08-10 DIAGNOSIS — I480 Paroxysmal atrial fibrillation: Secondary | ICD-10-CM | POA: Diagnosis not present

## 2015-08-11 DIAGNOSIS — R531 Weakness: Secondary | ICD-10-CM | POA: Diagnosis not present

## 2015-08-11 DIAGNOSIS — J189 Pneumonia, unspecified organism: Secondary | ICD-10-CM | POA: Diagnosis not present

## 2015-08-11 DIAGNOSIS — I480 Paroxysmal atrial fibrillation: Secondary | ICD-10-CM | POA: Diagnosis not present

## 2015-08-11 DIAGNOSIS — Z8701 Personal history of pneumonia (recurrent): Secondary | ICD-10-CM | POA: Diagnosis not present

## 2015-08-11 DIAGNOSIS — I509 Heart failure, unspecified: Secondary | ICD-10-CM | POA: Diagnosis not present

## 2015-08-11 DIAGNOSIS — Z09 Encounter for follow-up examination after completed treatment for conditions other than malignant neoplasm: Secondary | ICD-10-CM | POA: Diagnosis not present

## 2015-08-11 DIAGNOSIS — M150 Primary generalized (osteo)arthritis: Secondary | ICD-10-CM | POA: Diagnosis not present

## 2015-08-11 DIAGNOSIS — J45909 Unspecified asthma, uncomplicated: Secondary | ICD-10-CM | POA: Diagnosis not present

## 2015-08-11 DIAGNOSIS — R490 Dysphonia: Secondary | ICD-10-CM | POA: Diagnosis not present

## 2015-08-11 DIAGNOSIS — R21 Rash and other nonspecific skin eruption: Secondary | ICD-10-CM | POA: Diagnosis not present

## 2015-08-11 DIAGNOSIS — R935 Abnormal findings on diagnostic imaging of other abdominal regions, including retroperitoneum: Secondary | ICD-10-CM | POA: Diagnosis not present

## 2015-08-11 DIAGNOSIS — Z8673 Personal history of transient ischemic attack (TIA), and cerebral infarction without residual deficits: Secondary | ICD-10-CM | POA: Diagnosis not present

## 2015-08-14 DIAGNOSIS — I480 Paroxysmal atrial fibrillation: Secondary | ICD-10-CM | POA: Diagnosis not present

## 2015-08-14 DIAGNOSIS — Z8701 Personal history of pneumonia (recurrent): Secondary | ICD-10-CM | POA: Diagnosis not present

## 2015-08-14 DIAGNOSIS — M150 Primary generalized (osteo)arthritis: Secondary | ICD-10-CM | POA: Diagnosis not present

## 2015-08-14 DIAGNOSIS — I509 Heart failure, unspecified: Secondary | ICD-10-CM | POA: Diagnosis not present

## 2015-08-14 DIAGNOSIS — Z8673 Personal history of transient ischemic attack (TIA), and cerebral infarction without residual deficits: Secondary | ICD-10-CM | POA: Diagnosis not present

## 2015-08-14 DIAGNOSIS — J45909 Unspecified asthma, uncomplicated: Secondary | ICD-10-CM | POA: Diagnosis not present

## 2015-08-15 DIAGNOSIS — Z8701 Personal history of pneumonia (recurrent): Secondary | ICD-10-CM | POA: Diagnosis not present

## 2015-08-15 DIAGNOSIS — Z8673 Personal history of transient ischemic attack (TIA), and cerebral infarction without residual deficits: Secondary | ICD-10-CM | POA: Diagnosis not present

## 2015-08-15 DIAGNOSIS — M150 Primary generalized (osteo)arthritis: Secondary | ICD-10-CM | POA: Diagnosis not present

## 2015-08-15 DIAGNOSIS — I509 Heart failure, unspecified: Secondary | ICD-10-CM | POA: Diagnosis not present

## 2015-08-15 DIAGNOSIS — I480 Paroxysmal atrial fibrillation: Secondary | ICD-10-CM | POA: Diagnosis not present

## 2015-08-15 DIAGNOSIS — J45909 Unspecified asthma, uncomplicated: Secondary | ICD-10-CM | POA: Diagnosis not present

## 2015-08-16 DIAGNOSIS — Z8673 Personal history of transient ischemic attack (TIA), and cerebral infarction without residual deficits: Secondary | ICD-10-CM | POA: Diagnosis not present

## 2015-08-16 DIAGNOSIS — I480 Paroxysmal atrial fibrillation: Secondary | ICD-10-CM | POA: Diagnosis not present

## 2015-08-16 DIAGNOSIS — J45909 Unspecified asthma, uncomplicated: Secondary | ICD-10-CM | POA: Diagnosis not present

## 2015-08-16 DIAGNOSIS — I509 Heart failure, unspecified: Secondary | ICD-10-CM | POA: Diagnosis not present

## 2015-08-16 DIAGNOSIS — M150 Primary generalized (osteo)arthritis: Secondary | ICD-10-CM | POA: Diagnosis not present

## 2015-08-16 DIAGNOSIS — Z8701 Personal history of pneumonia (recurrent): Secondary | ICD-10-CM | POA: Diagnosis not present

## 2015-08-17 DIAGNOSIS — I509 Heart failure, unspecified: Secondary | ICD-10-CM | POA: Diagnosis not present

## 2015-08-17 DIAGNOSIS — Z8701 Personal history of pneumonia (recurrent): Secondary | ICD-10-CM | POA: Diagnosis not present

## 2015-08-17 DIAGNOSIS — I480 Paroxysmal atrial fibrillation: Secondary | ICD-10-CM | POA: Diagnosis not present

## 2015-08-17 DIAGNOSIS — Z8673 Personal history of transient ischemic attack (TIA), and cerebral infarction without residual deficits: Secondary | ICD-10-CM | POA: Diagnosis not present

## 2015-08-17 DIAGNOSIS — M150 Primary generalized (osteo)arthritis: Secondary | ICD-10-CM | POA: Diagnosis not present

## 2015-08-17 DIAGNOSIS — J45909 Unspecified asthma, uncomplicated: Secondary | ICD-10-CM | POA: Diagnosis not present

## 2015-08-18 DIAGNOSIS — Z8673 Personal history of transient ischemic attack (TIA), and cerebral infarction without residual deficits: Secondary | ICD-10-CM | POA: Diagnosis not present

## 2015-08-18 DIAGNOSIS — Z8701 Personal history of pneumonia (recurrent): Secondary | ICD-10-CM | POA: Diagnosis not present

## 2015-08-18 DIAGNOSIS — I480 Paroxysmal atrial fibrillation: Secondary | ICD-10-CM | POA: Diagnosis not present

## 2015-08-18 DIAGNOSIS — M150 Primary generalized (osteo)arthritis: Secondary | ICD-10-CM | POA: Diagnosis not present

## 2015-08-18 DIAGNOSIS — I509 Heart failure, unspecified: Secondary | ICD-10-CM | POA: Diagnosis not present

## 2015-08-18 DIAGNOSIS — J45909 Unspecified asthma, uncomplicated: Secondary | ICD-10-CM | POA: Diagnosis not present

## 2015-08-22 DIAGNOSIS — I509 Heart failure, unspecified: Secondary | ICD-10-CM | POA: Diagnosis not present

## 2015-08-22 DIAGNOSIS — I480 Paroxysmal atrial fibrillation: Secondary | ICD-10-CM | POA: Diagnosis not present

## 2015-08-22 DIAGNOSIS — M150 Primary generalized (osteo)arthritis: Secondary | ICD-10-CM | POA: Diagnosis not present

## 2015-08-22 DIAGNOSIS — Z8673 Personal history of transient ischemic attack (TIA), and cerebral infarction without residual deficits: Secondary | ICD-10-CM | POA: Diagnosis not present

## 2015-08-22 DIAGNOSIS — Z8701 Personal history of pneumonia (recurrent): Secondary | ICD-10-CM | POA: Diagnosis not present

## 2015-08-22 DIAGNOSIS — J45909 Unspecified asthma, uncomplicated: Secondary | ICD-10-CM | POA: Diagnosis not present

## 2015-08-23 DIAGNOSIS — I509 Heart failure, unspecified: Secondary | ICD-10-CM | POA: Diagnosis not present

## 2015-08-23 DIAGNOSIS — I480 Paroxysmal atrial fibrillation: Secondary | ICD-10-CM | POA: Diagnosis not present

## 2015-08-23 DIAGNOSIS — J45909 Unspecified asthma, uncomplicated: Secondary | ICD-10-CM | POA: Diagnosis not present

## 2015-08-23 DIAGNOSIS — Z8701 Personal history of pneumonia (recurrent): Secondary | ICD-10-CM | POA: Diagnosis not present

## 2015-08-23 DIAGNOSIS — Z8673 Personal history of transient ischemic attack (TIA), and cerebral infarction without residual deficits: Secondary | ICD-10-CM | POA: Diagnosis not present

## 2015-08-23 DIAGNOSIS — M150 Primary generalized (osteo)arthritis: Secondary | ICD-10-CM | POA: Diagnosis not present

## 2015-08-24 DIAGNOSIS — I480 Paroxysmal atrial fibrillation: Secondary | ICD-10-CM | POA: Diagnosis not present

## 2015-08-24 DIAGNOSIS — Z8701 Personal history of pneumonia (recurrent): Secondary | ICD-10-CM | POA: Diagnosis not present

## 2015-08-24 DIAGNOSIS — Z8673 Personal history of transient ischemic attack (TIA), and cerebral infarction without residual deficits: Secondary | ICD-10-CM | POA: Diagnosis not present

## 2015-08-24 DIAGNOSIS — J45909 Unspecified asthma, uncomplicated: Secondary | ICD-10-CM | POA: Diagnosis not present

## 2015-08-24 DIAGNOSIS — M150 Primary generalized (osteo)arthritis: Secondary | ICD-10-CM | POA: Diagnosis not present

## 2015-08-24 DIAGNOSIS — I509 Heart failure, unspecified: Secondary | ICD-10-CM | POA: Diagnosis not present

## 2015-08-25 DIAGNOSIS — Z8673 Personal history of transient ischemic attack (TIA), and cerebral infarction without residual deficits: Secondary | ICD-10-CM | POA: Diagnosis not present

## 2015-08-25 DIAGNOSIS — M150 Primary generalized (osteo)arthritis: Secondary | ICD-10-CM | POA: Diagnosis not present

## 2015-08-25 DIAGNOSIS — J45909 Unspecified asthma, uncomplicated: Secondary | ICD-10-CM | POA: Diagnosis not present

## 2015-08-25 DIAGNOSIS — Z8701 Personal history of pneumonia (recurrent): Secondary | ICD-10-CM | POA: Diagnosis not present

## 2015-08-25 DIAGNOSIS — I509 Heart failure, unspecified: Secondary | ICD-10-CM | POA: Diagnosis not present

## 2015-08-25 DIAGNOSIS — I480 Paroxysmal atrial fibrillation: Secondary | ICD-10-CM | POA: Diagnosis not present

## 2015-08-28 DIAGNOSIS — M5412 Radiculopathy, cervical region: Secondary | ICD-10-CM | POA: Diagnosis not present

## 2015-08-28 DIAGNOSIS — I951 Orthostatic hypotension: Secondary | ICD-10-CM | POA: Diagnosis not present

## 2015-08-30 DIAGNOSIS — M19011 Primary osteoarthritis, right shoulder: Secondary | ICD-10-CM | POA: Diagnosis not present

## 2015-08-30 DIAGNOSIS — M4722 Other spondylosis with radiculopathy, cervical region: Secondary | ICD-10-CM | POA: Diagnosis not present

## 2015-09-05 DIAGNOSIS — M19011 Primary osteoarthritis, right shoulder: Secondary | ICD-10-CM | POA: Diagnosis not present

## 2015-09-07 ENCOUNTER — Ambulatory Visit (INDEPENDENT_AMBULATORY_CARE_PROVIDER_SITE_OTHER): Payer: Medicare Other | Admitting: Podiatry

## 2015-09-07 ENCOUNTER — Encounter: Payer: Self-pay | Admitting: Podiatry

## 2015-09-07 VITALS — BP 112/61 | HR 79 | Resp 14

## 2015-09-07 DIAGNOSIS — M79676 Pain in unspecified toe(s): Secondary | ICD-10-CM

## 2015-09-07 DIAGNOSIS — M5021 Other cervical disc displacement,  high cervical region: Secondary | ICD-10-CM | POA: Diagnosis not present

## 2015-09-07 DIAGNOSIS — M47812 Spondylosis without myelopathy or radiculopathy, cervical region: Secondary | ICD-10-CM | POA: Diagnosis not present

## 2015-09-07 DIAGNOSIS — B351 Tinea unguium: Secondary | ICD-10-CM

## 2015-09-07 NOTE — Addendum Note (Signed)
Addended by: Ezzard Flax, Andrue Dini L on: 09/07/2015 04:19 PM   Modules accepted: Orders, Medications

## 2015-09-07 NOTE — Progress Notes (Deleted)
   Subjective:    Patient ID: Caroline Myers, female    DOB: Jul 03, 1923, 82 y.o.   MRN: [id number]  HPI    Review of Systems  All other systems reviewed and are negative.      Objective:   Physical Exam        Assessment & Plan:

## 2015-09-07 NOTE — Progress Notes (Signed)
Patient ID: Caroline Myers, female   DOB: 1924-04-28, 82 y.o.   MRN: [id number] Complaint:  Visit Type: Patient returns to my office for continued preventative foot care services. Complaint: Patient states" my nails have grown long and thick and become painful to walk and wear shoes". The patient presents for preventative foot care services. No changes to ROS  Podiatric Exam: Vascular: dorsalis pedis and posterior tibial pulses are palpable bilateral. Capillary return is immediate. Temperature gradient is WNL. Skin turgor WNL  Sensorium: Normal Semmes Weinstein monofilament test. Normal tactile sensation bilaterally. Nail Exam: Pt has thick disfigured discolored nails with subungual debris noted bilateral entire nail hallux  Ulcer Exam: There is no evidence of ulcer or pre-ulcerative changes or infection. Orthopedic Exam: Muscle tone and strength are WNL. No limitations in general ROM. No crepitus or effusions noted. Foot type and digits show no abnormalities. Bony prominences are unremarkable. Skin: No Porokeratosis. No infection or ulcers  Diagnosis:  Onychomycosis, , Pain in right toe, pain in left toes  Treatment & Plan Procedures and Treatment: Consent by patient was obtained for treatment procedures. The patient understood the discussion of treatment and procedures well. All questions were answered thoroughly reviewed. Debridement of mycotic and hypertrophic toenails, 1 through 5 bilateral and clearing of subungual debris. No ulceration, no infection noted.  Return Visit-Office Procedure: Patient instructed to return to the office for a follow up visit 3 months for continued evaluation and treatment.    Gardiner Barefoot DPM

## 2015-09-08 DIAGNOSIS — J189 Pneumonia, unspecified organism: Secondary | ICD-10-CM | POA: Diagnosis not present

## 2015-09-11 DIAGNOSIS — M542 Cervicalgia: Secondary | ICD-10-CM | POA: Diagnosis not present

## 2015-09-12 DIAGNOSIS — M5412 Radiculopathy, cervical region: Secondary | ICD-10-CM | POA: Diagnosis not present

## 2015-09-12 DIAGNOSIS — M542 Cervicalgia: Secondary | ICD-10-CM | POA: Diagnosis not present

## 2015-09-17 ENCOUNTER — Emergency Department (HOSPITAL_COMMUNITY): Payer: Medicare Other

## 2015-09-17 ENCOUNTER — Emergency Department (HOSPITAL_COMMUNITY)
Admission: EM | Admit: 2015-09-17 | Discharge: 2015-09-17 | Disposition: A | Discharge status: Home / Self Care | Payer: Medicare Other | Attending: Physician Assistant | Admitting: Physician Assistant

## 2015-09-17 ENCOUNTER — Other Ambulatory Visit: Payer: Self-pay

## 2015-09-17 ENCOUNTER — Encounter (HOSPITAL_COMMUNITY): Payer: Self-pay | Admitting: *Deleted

## 2015-09-17 DIAGNOSIS — Z85828 Personal history of other malignant neoplasm of skin: Secondary | ICD-10-CM | POA: Diagnosis not present

## 2015-09-17 DIAGNOSIS — J811 Chronic pulmonary edema: Secondary | ICD-10-CM | POA: Diagnosis not present

## 2015-09-17 DIAGNOSIS — R0789 Other chest pain: Secondary | ICD-10-CM

## 2015-09-17 DIAGNOSIS — Z79899 Other long term (current) drug therapy: Secondary | ICD-10-CM | POA: Insufficient documentation

## 2015-09-17 DIAGNOSIS — Z7901 Long term (current) use of anticoagulants: Secondary | ICD-10-CM | POA: Diagnosis not present

## 2015-09-17 DIAGNOSIS — Z8719 Personal history of other diseases of the digestive system: Secondary | ICD-10-CM | POA: Insufficient documentation

## 2015-09-17 DIAGNOSIS — R079 Chest pain, unspecified: Secondary | ICD-10-CM | POA: Diagnosis not present

## 2015-09-17 DIAGNOSIS — Z8739 Personal history of other diseases of the musculoskeletal system and connective tissue: Secondary | ICD-10-CM | POA: Insufficient documentation

## 2015-09-17 DIAGNOSIS — Z8673 Personal history of transient ischemic attack (TIA), and cerebral infarction without residual deficits: Secondary | ICD-10-CM | POA: Insufficient documentation

## 2015-09-17 DIAGNOSIS — F419 Anxiety disorder, unspecified: Secondary | ICD-10-CM | POA: Diagnosis not present

## 2015-09-17 DIAGNOSIS — I480 Paroxysmal atrial fibrillation: Secondary | ICD-10-CM | POA: Insufficient documentation

## 2015-09-17 DIAGNOSIS — J45909 Unspecified asthma, uncomplicated: Secondary | ICD-10-CM | POA: Insufficient documentation

## 2015-09-17 DIAGNOSIS — Z7951 Long term (current) use of inhaled steroids: Secondary | ICD-10-CM | POA: Insufficient documentation

## 2015-09-17 LAB — BASIC METABOLIC PANEL
Anion gap: 8 (ref 5–15)
BUN: 23 mg/dL — ABNORMAL HIGH (ref 6–20)
CHLORIDE: 105 mmol/L (ref 101–111)
CO2: 28 mmol/L (ref 22–32)
Calcium: 8.9 mg/dL (ref 8.9–10.3)
Creatinine, Ser: 0.8 mg/dL (ref 0.44–1.00)
GFR calc non Af Amer: >60 mL/min (ref >60)
Glucose, Bld: 111 mg/dL — ABNORMAL HIGH (ref 65–99)
POTASSIUM: 4.2 mmol/L (ref 3.5–5.1)
SODIUM: 141 mmol/L (ref 135–145)

## 2015-09-17 LAB — CBC
HEMATOCRIT: 42 % (ref 36.0–46.0)
Hemoglobin: 13.4 g/dL (ref 12.0–15.0)
MCH: 32.1 pg (ref 26.0–34.0)
MCHC: 31.9 g/dL (ref 30.0–36.0)
MCV: 100.5 fL — AB (ref 78.0–100.0)
Platelets: 207 10*3/uL (ref 150–400)
RBC: 4.18 MIL/uL (ref 3.87–5.11)
RDW: 14.2 % (ref 11.5–15.5)
WBC: 7.4 10*3/uL (ref 4.0–10.5)

## 2015-09-17 LAB — I-STAT TROPONIN, ED
TROPONIN I, POC: 0.04 ng/mL (ref 0.00–0.08)
Troponin i, poc: 0.05 ng/mL (ref 0.00–0.08)

## 2015-09-17 NOTE — ED Notes (Signed)
MD at bedside. 

## 2015-09-17 NOTE — ED Provider Notes (Signed)
CSN: [id number]     Arrival date & time 09/17/15  1402 History   First MD Initiated Contact with Patient 09/17/15 1416     Chief Complaint  Patient presents with  . Chest Pain     (Consider location/radiation/quality/duration/timing/severity/associated sxs/prior Treatment) HPI\  82 year old with CP.  Patient has history of pAfib, aortic stenosis on Eliquis chads score 5 .  Woke up last night with CP and SOB, lasted to the morning. Went to PCP, did EKG, sent her here for further work up.   CP dull pain, Non pleuritic, not exertionally related. Patient has since resolved. No shortness of breath.  Pt is a patient of Dr. Irish Lack.    Past Medical History  Diagnosis Date  . Allergy     allergic rhinitis  . Cancer [facility])     history of skin cancer of the nose  . Asthma 2000    mild. tried Advair after bronchospasm after exposure to cats  . Shoulder pain, left 05/2010    with rotator cuff tear--Dr Alfonso Ramus  . Paroxysmal atrial fibrillation (HCC)     pt does not want anticoagulation  . Aortic stenosis     EF normal.  Mild aortic stenosis. Mild to moderate aortic regurgitation  . Anxiety   . Shortness of breath     occ  . GERD (gastroesophageal reflux disease)     occ  . Arthritis   . Stroke [facility])    Past Surgical History  Procedure Laterality Date  . Hernia repair  2003  . Acromioplasty  2000    right shoulder, arthroscopic  . Lumbar laminectomy/decompression microdiscectomy  04/01/2012    Procedure: LUMBAR LAMINECTOMY/DECOMPRESSION MICRODISCECTOMY 1 LEVEL;  Surgeon: Eustace Moore, MD;  Location: [place] [facility];  Service: Neurosurgery;  Laterality: Right;  Right Lumbar four-five extraforaminal microdiskectomy    Family History  Problem Relation Age of Onset  . Heart attack Neg Hx   . Stroke Sister   . Hypertension Neg Hx    Social History  Substance Use Topics  . Smoking status: Never Smoker   . Smokeless tobacco: Never Used     Comment: occ wine  . Alcohol Use: 0.0  oz/week    0 Standard drinks or equivalent per week     Comment: OCC   OB History    No data available     Review of Systems  Constitutional: Negative for activity change.  HENT: Negative for congestion.   Respiratory: Negative for cough and shortness of breath.   Cardiovascular: Positive for chest pain.  Gastrointestinal: Negative for abdominal pain.  Genitourinary: Negative for dysuria.  Skin: Negative for rash.  Neurological: Negative for dizziness and light-headedness.  Psychiatric/Behavioral: Negative for agitation.      Allergies  Tape  Home Medications   Prior to Admission medications   Medication Sig Start Date End Date Taking? Authorizing Provider  acyclovir cream (ZOVIRAX) 5 % Apply 1 application topically every 4 (four) hours. 07/28/15  Yes Daniel J Angiulli, PA-C  apixaban (ELIQUIS) 5 MG TABS tablet Take 1 tablet (5 mg total) by mouth 2 (two) times daily. 07/28/15  Yes Daniel J Angiulli, PA-C  B Complex-C (B-COMPLEX WITH VITAMIN C) tablet Take 1 tablet by mouth daily.   Yes Historical Provider, MD  diltiazem (CARDIZEM CD) 180 MG 24 hr capsule Take 1 capsule (180 mg total) by mouth daily. 07/28/15  Yes Daniel J Angiulli, PA-C  mometasone-formoterol (DULERA) 100-5 MCG/ACT AERO Inhale 2 puffs into the lungs 2 (  two) times daily as needed for wheezing.    Yes Historical Provider, MD  nystatin cream (MYCOSTATIN) Apply topically 2 (two) times daily. Applied affected area twice daily until resolved 07/28/15  Yes Daniel J Angiulli, PA-C  sertraline (ZOLOFT) 50 MG tablet Take 1 tablet (50 mg total) by mouth daily. 07/28/15  Yes Daniel J Angiulli, PA-C  guaiFENesin (MUCINEX) 600 MG 12 hr tablet Take 2 tablets (1,200 mg total) by mouth 2 (two) times daily. Patient not taking: Reported on 09/17/2015 07/28/15   Lavon Paganini Angiulli, PA-C  PROAIR HFA 108 [phone] Base) MCG/ACT inhaler Inhale 1-2 puffs into the lungs every 4 (four) hours as needed for wheezing or shortness of breath. Patient not  taking: Reported on 09/17/2015 07/28/15   Lavon Paganini Angiulli, PA-C  saccharomyces boulardii (FLORASTOR) 250 MG capsule Take 1 capsule (250 mg total) by mouth 2 (two) times daily. Patient not taking: Reported on 09/17/2015 07/28/15   Lavon Paganini Angiulli, PA-C  senna-docusate (SENOKOT-S) 8.6-50 MG tablet Take 1 tablet by mouth at bedtime as needed for mild constipation or moderate constipation. Patient not taking: Reported on 09/17/2015 07/20/15   Hosie Poisson, MD  traMADol (ULTRAM) 50 MG tablet Take 1 tablet (50 mg total) by mouth every 6 (six) hours as needed. Patient not taking: Reported on 09/17/2015 08/02/15   Fredia Sorrow, MD   BP 115/83 mmHg  Pulse 81  Temp(Src) 97.6 F (36.4 C) (Oral)  Resp 19  SpO2 99% Physical Exam  Constitutional: She is oriented to person, place, and time. She appears well-developed and well-nourished.  HENT:  Head: Normocephalic and atraumatic.  Eyes: Conjunctivae are normal. Right eye exhibits no discharge.  Neck: Neck supple.  Cardiovascular: Normal rate, regular rhythm and normal heart sounds.   No murmur heard. Pulmonary/Chest: Effort normal and breath sounds normal. She has no wheezes. She has no rales.  Abdominal: Soft. She exhibits no distension. There is no tenderness.  Musculoskeletal: Normal range of motion. She exhibits no edema.  Neurological: She is oriented to person, place, and time. No cranial nerve deficit.  Skin: Skin is warm and dry. No rash noted. She is not diaphoretic.  Psychiatric: She has a normal mood and affect. Her behavior is normal.  Nursing note and vitals reviewed.   ED Course  Procedures (including critical care time) Labs Review Labs Reviewed  BASIC METABOLIC PANEL - Abnormal; Notable for the following:    Glucose, Bld 111 (*)    BUN 23 (*)    All other components within normal limits  CBC - Abnormal; Notable for the following:    MCV 100.5 (*)    All other components within normal limits  I-STAT TROPOININ, ED    Imaging  Review No results found. I have personally reviewed and evaluated these images and lab results as part of my medical decision-making.   EKG Interpretation   Date/Time:  Sunday September 17 2015 13:58:30 EDT Ventricular Rate:  81 PR Interval:    QRS Duration: 97 QT Interval:  437 QTC Calculation: 507 R Axis:   -76 Text Interpretation:  Age not entered, assumed to be  82 years old for  purpose of ECG interpretation Atrial fibrillation No significant change  since last tracing Confirmed by Gerald Leitz ([id number]) on 09/17/2015  2:18:22 PM      MDM   Final diagnoses:  None    Patient is an 82 year old female with paroxysmal A. fib presenting with atypical chest pain. Patient had chest pain last night it is not  since resolved. Initial troponin is negative. Initial EKG is nonischemic. Resolved without intervention.  Patient has had negative coronaries in the past, negative stress test. Patient does not have hypertension or hyperlipidemia. Patient has no known history of coronary artery disease. And is already on Eliquis.  I discussed patient's case with cardiologist on call. Since his is atypical chest pain and given her age, they think it would be unlikely to do provocative testing as an inpatient. They recommend discharge home with follow-up.  Therefore I think it's reasonable to do a delta troponin, if continued to be negative to plan on having patient follow up with her cardiologist as an outpatient.  Discussed with patient and discussed risk and benefits of discharge versus inpatient cycle troponins. Patient already had an inpatient stay recently and would prefer to be home.  Delta trop signed out for 5:30 pm.     Macarthur Critchley, MD 09/17/15 1635

## 2015-09-17 NOTE — ED Notes (Signed)
Pt out of room in xray at this time 

## 2015-09-17 NOTE — Discharge Instructions (Signed)
Please return with any concerns and call your cardiologist in the morning to schedule follow up!

## 2015-09-17 NOTE — ED Notes (Signed)
EMS-patient reports chest pain that woke her up from her sleep last night associated with SOB, no other symptoms, that lasted until this am. Patient went to PCP for follow up and was sent to ED. Patient with hx of afib, takes eliquis. 20g(L)AC. Patient is chest pain and symptom free at this time.

## 2015-09-18 DIAGNOSIS — M542 Cervicalgia: Secondary | ICD-10-CM | POA: Diagnosis not present

## 2015-09-18 DIAGNOSIS — M5412 Radiculopathy, cervical region: Secondary | ICD-10-CM | POA: Diagnosis not present

## 2015-09-21 ENCOUNTER — Encounter: Payer: Self-pay | Admitting: Physician Assistant

## 2015-09-21 ENCOUNTER — Ambulatory Visit (INDEPENDENT_AMBULATORY_CARE_PROVIDER_SITE_OTHER): Payer: Medicare Other | Admitting: Physician Assistant

## 2015-09-21 VITALS — BP 100/60 | HR 81 | Ht 65.5 in | Wt 142.0 lb

## 2015-09-21 DIAGNOSIS — Z8673 Personal history of transient ischemic attack (TIA), and cerebral infarction without residual deficits: Secondary | ICD-10-CM | POA: Diagnosis not present

## 2015-09-21 DIAGNOSIS — I951 Orthostatic hypotension: Secondary | ICD-10-CM

## 2015-09-21 DIAGNOSIS — I480 Paroxysmal atrial fibrillation: Secondary | ICD-10-CM | POA: Diagnosis not present

## 2015-09-21 NOTE — Patient Instructions (Signed)

## 2015-09-21 NOTE — Progress Notes (Signed)
Cardiology Office Note   Date:  09/21/2015   ID:  Caroline Myers, DOB Mar 25, 1924, MRN [id number]  PCP:   Melinda Crutch, MD  Cardiologist:  Dr. Irish Lack  Chief Complaint  Patient presents with  . Follow-up    seen for Dr. Irish Lack, post hospital followup, atrial fibrillation/atrial flutter      History of Present Illness: Caroline Myers is a 82 y.o. female who presents for cardiogy follow-up. She has past medical history of permanent atrial flutter on eliquis with CHA2DS2-VASC score of 5, aortic valve stenosis, CVA 2. She underwent treadmill stress test in April 2008 which shows normal perfusion study, however she had chest pain and EKG changes during the terminal portion concerning for false negative study. She had normal coronaries on cardiac catheterization in May 2008. She was admitted in January with sepsis, hypertension, tachypnea and fever. Initial EKG showed atrial fibrillation with RVR, nonspecific T-wave abnormalities with heart rate of 130s. Cardiology was consulted during that admission. Repeat 2-D echo on 07/16/2015 showed EF 50-55%, severely dilated left atrium, moderate TR, peak PA pressure 51 mmHg. During January admission, she did have elevated troponin which was felt to be due to demand ischemia from tachypnea, febrile state, and rapid heart rate.  She presents today for cardiology follow-up, she has been doing well since discharge, she denies any recent cough, fever or chill after he left the hospital. Her heart rate is well controlled, she denies any chest discomfort or shortness breath. It is unclear how much diltiazem she is taking, according to the patient, since she left the hospital, she felt she is taking her 120 mg of long-acting diltiazem. However according to her record, she was discharged on 180 mg of long-acting diltiazem.     Past Medical History  Diagnosis Date  . Allergy     allergic rhinitis  . Cancer [facility])     history of skin cancer of the nose  .  Asthma 2000    mild. tried Advair after bronchospasm after exposure to cats  . Shoulder pain, left 05/2010    with rotator cuff tear--Dr Alfonso Ramus  . Paroxysmal atrial fibrillation (HCC)     pt does not want anticoagulation  . Aortic stenosis     EF normal.  Mild aortic stenosis. Mild to moderate aortic regurgitation  . Anxiety   . Shortness of breath     occ  . GERD (gastroesophageal reflux disease)     occ  . Arthritis   . Stroke [facility])     Past Surgical History  Procedure Laterality Date  . Hernia repair  2003  . Acromioplasty  2000    right shoulder, arthroscopic  . Lumbar laminectomy/decompression microdiscectomy  04/01/2012    Procedure: LUMBAR LAMINECTOMY/DECOMPRESSION MICRODISCECTOMY 1 LEVEL;  Surgeon: Eustace Moore, MD;  Location: [place] [facility];  Service: Neurosurgery;  Laterality: Right;  Right Lumbar four-five extraforaminal microdiskectomy      Current Outpatient Prescriptions  Medication Sig Dispense Refill  . apixaban (ELIQUIS) 5 MG TABS tablet Take 1 tablet (5 mg total) by mouth 2 (two) times daily. 180 tablet 3  . B Complex-C (B-COMPLEX WITH VITAMIN C) tablet Take 1 tablet by mouth daily.    Marland Kitchen diltiazem (CARDIZEM CD) 180 MG 24 hr capsule Take 1 capsule (180 mg total) by mouth daily. 30 capsule 1  . mometasone-formoterol (DULERA) 100-5 MCG/ACT AERO Inhale 2 puffs into the lungs 2 (two) times daily as needed for wheezing.     Marland Kitchen PROAIR  HFA 108 (90 Base) MCG/ACT inhaler Inhale 1-2 puffs into the lungs every 4 (four) hours as needed for wheezing or shortness of breath. 5 Inhaler 0  . sertraline (ZOLOFT) 50 MG tablet Take 1 tablet (50 mg total) by mouth daily. 30 tablet 4   No current facility-administered medications for this visit.    Allergies:   Tape    Social History:  The patient  reports that she has never smoked. She has never used smokeless tobacco. She reports that she drinks alcohol. She reports that she does not use illicit drugs.   Family History:  The  patient's family history includes Stroke in her sister. There is no history of Heart attack or Hypertension.    ROS:  Please see the history of present illness.   Otherwise, review of systems are positive for None.   All other systems are reviewed and negative.    PHYSICAL EXAM: VS:  BP 100/60 mmHg  Pulse 81  Ht 5' 5.5" (1.664 m)  Wt 142 lb (64.411 kg)  BMI 23.26 kg/m2 , BMI Body mass index is 23.26 kg/(m^2). GEN: Well nourished, well developed, in no acute distress HEENT: normal Neck: no JVD, carotid bruits, or masses Cardiac: irregular; no murmurs, rubs, or gallops,no edema  Respiratory:  clear to auscultation bilaterally, normal work of breathing GI: soft, nontender, nondistended, + BS MS: no deformity or atrophy Skin: warm and dry, no rash Neuro:  Strength and sensation are intact Psych: euthymic mood, full affect   EKG:  EKG is ordered today. The ekg ordered today demonstrates atrial flutter with HR 75   Recent Labs: 01/02/2015: TSH 1.682 07/15/2015: B Natriuretic Peptide 123.7* 07/18/2015: Magnesium 2.1 08/02/2015: ALT 23 09/17/2015: BUN 23*; Creatinine, Ser 0.80; Hemoglobin 13.4; Platelets 207; Potassium 4.2; Sodium 141    Lipid Panel    Component Value Date/Time   CHOL 199 02/15/2015 0054   TRIG 101 02/15/2015 0054   HDL 42 02/15/2015 0054   CHOLHDL 4.7 02/15/2015 0054   VLDL 20 02/15/2015 0054   LDLCALC 137* 02/15/2015 0054      Wt Readings from Last 3 Encounters:  09/21/15 142 lb (64.411 kg)  07/29/15 145 lb 15.1 oz (66.2 kg)  07/20/15 159 lb (72.122 kg)      Other studies Reviewed: Additional studies/ records that were reviewed today include:   Echo 07/16/2015 LV EF: 50% - 55%  ------------------------------------------------------------------- Indications: Dyspnea 786.09.  ------------------------------------------------------------------- History: PMH: Atrial fibrillation. Risk factors: Aortic stenosis. Elevated troponin.  Sepsis.  ------------------------------------------------------------------- Study Conclusions  - Left ventricle: The cavity size was normal. There was moderate  concentric hypertrophy. Systolic function was normal. The  estimated ejection fraction was in the range of 50% to 55%.  Incoordinate septal motion. The study is not technically  sufficient to allow evaluation of LV diastolic function. The E/e&'  ratio is >15, suggesting elevated LV filling pressure. - Aortic valve: Trileaflet; mildly calcified leaflets. Mild aortic  stenosis. There was mild regurgitation. Mean gradient (S): 11 mm  Hg. Peak gradient (S): 23 mm Hg. - Mitral valve: Mildly thickened leaflets . There was moderate  regurgitation. - Left atrium: Severely dilated at 52 ml/m2. - Right atrium: Moderately dilated. - Tricuspid valve: There was moderate regurgitation. - Pulmonary arteries: PA peak pressure: 51 mm Hg (S). - Inferior vena cava: The vessel was dilated. The respirophasic  diameter changes were blunted (< 50%), consistent with elevated  central venous pressure.  Impressions:  - Compared to a prior echo in 2015, the EF  is lower at 50-55% with  incoordinate septal motion. There is mild aortic stenosis - mean  gradient of 11 mmHg. Moderate TR with RVSP of 51 mmHg, suggesting  moderate pulmonary hypertension.   Review of the above records demonstrates:   Recently admitted with pneumonia and had increase in heart rate of permanent atrial flutter, cardiology consulted for rate control.     ASSESSMENT AND PLAN:  1.  Permanent afib/atrial flutter on eliquis with CHA2DS2-VASC score of 5: She has been in atrial flutter for many years, I have reviewed all EKG tracing back to June 2015, it appears she is in atrial flutter instead of atrial fibrillation. It is unclear what dose of diltiazem she has been taking at home, however even if she is taking 120 mg of long-acting diltiazem, I'm less  inclined to increase the dose today given her systolic blood pressure [id number] and her heart rate is well controlled. I will inform the patient to bring her medication bottles in with her next follow-up.  2. H/o CVA 2   Current medicines are reviewed at length with the patient today.  The patient does not have concerns regarding medicines.  The following changes have been made:  no change  Labs/ tests ordered today include:   Orders Placed This Encounter  Procedures  . EKG 12-Lead     Disposition:   FU with Dr. Irish Lack in 3 months  Signed, Almyra Deforest, [place]  09/21/2015 7:19 PM    [address], [place], [place]  [address] Phone: [phone]; Fax: [phone]

## 2015-09-22 ENCOUNTER — Telehealth: Payer: Self-pay | Admitting: *Deleted

## 2015-09-22 NOTE — Telephone Encounter (Signed)
Per Almyra Deforest, PA-C, called pt to advise her: Please remind patient to bring her medication bottles for next cardiology visit, unclear how much diltiazem she is taking at home, our record indicate 180mg  daily, she says her dose was decreased to 120mg  daily.   Pt verbalized understanding.

## 2015-10-02 DIAGNOSIS — M542 Cervicalgia: Secondary | ICD-10-CM | POA: Diagnosis not present

## 2015-10-02 DIAGNOSIS — M5412 Radiculopathy, cervical region: Secondary | ICD-10-CM | POA: Diagnosis not present

## 2015-10-05 DIAGNOSIS — M542 Cervicalgia: Secondary | ICD-10-CM | POA: Diagnosis not present

## 2015-10-05 DIAGNOSIS — M5412 Radiculopathy, cervical region: Secondary | ICD-10-CM | POA: Diagnosis not present

## 2015-10-09 DIAGNOSIS — H26492 Other secondary cataract, left eye: Secondary | ICD-10-CM | POA: Diagnosis not present

## 2015-10-09 DIAGNOSIS — H04123 Dry eye syndrome of bilateral lacrimal glands: Secondary | ICD-10-CM | POA: Diagnosis not present

## 2015-10-12 DIAGNOSIS — M542 Cervicalgia: Secondary | ICD-10-CM | POA: Diagnosis not present

## 2015-10-12 DIAGNOSIS — M5412 Radiculopathy, cervical region: Secondary | ICD-10-CM | POA: Diagnosis not present

## 2015-10-16 DIAGNOSIS — M25511 Pain in right shoulder: Secondary | ICD-10-CM | POA: Diagnosis not present

## 2015-10-16 DIAGNOSIS — M542 Cervicalgia: Secondary | ICD-10-CM | POA: Diagnosis not present

## 2015-10-17 DIAGNOSIS — M542 Cervicalgia: Secondary | ICD-10-CM | POA: Diagnosis not present

## 2015-10-17 DIAGNOSIS — M5412 Radiculopathy, cervical region: Secondary | ICD-10-CM | POA: Diagnosis not present

## 2015-10-20 DIAGNOSIS — M5412 Radiculopathy, cervical region: Secondary | ICD-10-CM | POA: Diagnosis not present

## 2015-10-20 DIAGNOSIS — M542 Cervicalgia: Secondary | ICD-10-CM | POA: Diagnosis not present

## 2015-10-23 DIAGNOSIS — M542 Cervicalgia: Secondary | ICD-10-CM | POA: Diagnosis not present

## 2015-10-23 DIAGNOSIS — M5412 Radiculopathy, cervical region: Secondary | ICD-10-CM | POA: Diagnosis not present

## 2015-10-26 DIAGNOSIS — H26492 Other secondary cataract, left eye: Secondary | ICD-10-CM | POA: Diagnosis not present

## 2015-10-30 DIAGNOSIS — M542 Cervicalgia: Secondary | ICD-10-CM | POA: Diagnosis not present

## 2015-10-30 DIAGNOSIS — M5412 Radiculopathy, cervical region: Secondary | ICD-10-CM | POA: Diagnosis not present

## 2015-11-02 DIAGNOSIS — M542 Cervicalgia: Secondary | ICD-10-CM | POA: Diagnosis not present

## 2015-11-02 DIAGNOSIS — M5412 Radiculopathy, cervical region: Secondary | ICD-10-CM | POA: Diagnosis not present

## 2015-11-06 ENCOUNTER — Other Ambulatory Visit: Payer: Self-pay | Admitting: Physician Assistant

## 2015-11-06 ENCOUNTER — Ambulatory Visit
Admission: RE | Admit: 2015-11-06 | Discharge: 2015-11-06 | Disposition: A | Discharge status: Home / Self Care | Payer: Medicare Other | Source: Ambulatory Visit | Attending: Physician Assistant | Admitting: Physician Assistant

## 2015-11-06 DIAGNOSIS — M25511 Pain in right shoulder: Secondary | ICD-10-CM | POA: Diagnosis not present

## 2015-11-06 DIAGNOSIS — S0992XA Unspecified injury of nose, initial encounter: Secondary | ICD-10-CM | POA: Diagnosis not present

## 2015-11-06 DIAGNOSIS — J3489 Other specified disorders of nose and nasal sinuses: Secondary | ICD-10-CM | POA: Diagnosis not present

## 2015-11-06 DIAGNOSIS — W19XXXA Unspecified fall, initial encounter: Secondary | ICD-10-CM | POA: Diagnosis not present

## 2015-11-09 DIAGNOSIS — M25511 Pain in right shoulder: Secondary | ICD-10-CM | POA: Diagnosis not present

## 2015-11-16 DIAGNOSIS — M25511 Pain in right shoulder: Secondary | ICD-10-CM | POA: Diagnosis not present

## 2015-11-22 DIAGNOSIS — L089 Local infection of the skin and subcutaneous tissue, unspecified: Secondary | ICD-10-CM | POA: Diagnosis not present

## 2015-11-23 DIAGNOSIS — M25511 Pain in right shoulder: Secondary | ICD-10-CM | POA: Diagnosis not present

## 2015-11-24 DIAGNOSIS — S80812D Abrasion, left lower leg, subsequent encounter: Secondary | ICD-10-CM | POA: Diagnosis not present

## 2015-11-24 DIAGNOSIS — S80811D Abrasion, right lower leg, subsequent encounter: Secondary | ICD-10-CM | POA: Diagnosis not present

## 2015-11-30 DIAGNOSIS — M25511 Pain in right shoulder: Secondary | ICD-10-CM | POA: Diagnosis not present

## 2015-12-01 DIAGNOSIS — F430 Acute stress reaction: Secondary | ICD-10-CM | POA: Diagnosis not present

## 2015-12-01 DIAGNOSIS — F419 Anxiety disorder, unspecified: Secondary | ICD-10-CM | POA: Diagnosis not present

## 2015-12-06 ENCOUNTER — Ambulatory Visit: Payer: Medicare Other | Admitting: Podiatry

## 2015-12-07 ENCOUNTER — Ambulatory Visit: Payer: Medicare Other | Admitting: Podiatry

## 2015-12-15 DIAGNOSIS — H6121 Impacted cerumen, right ear: Secondary | ICD-10-CM | POA: Diagnosis not present

## 2015-12-15 DIAGNOSIS — R040 Epistaxis: Secondary | ICD-10-CM | POA: Diagnosis not present

## 2015-12-18 ENCOUNTER — Telehealth: Payer: Self-pay | Admitting: Interventional Cardiology

## 2015-12-18 NOTE — Telephone Encounter (Addendum)
I left a detailed message on the pts VM as requested.  Per Caroline Myers, Pharm D the pt is advised of the following:  1. That Eliquis does not cause nosebleeds but can make them worse. 2. If the pts nose bleeds for more than an hour multiple times see needs to call an ENT. 3. She may use saline or Vaseline and to not pick nose.      I also left this office's phone number for her to call back if she has any questions or concerns.

## 2015-12-18 NOTE — Telephone Encounter (Signed)
New Messsage  Pt calling to speak w/ rN about her frequent nosebleeds- wants to know if has to do w/ Eliquis. Can leave detailed VM. Please call back and discuss.

## 2016-01-01 ENCOUNTER — Telehealth: Payer: Self-pay | Admitting: Interventional Cardiology

## 2016-01-01 NOTE — Telephone Encounter (Signed)
New messages    Pt is calling because she is having constant nosebleeds

## 2016-01-01 NOTE — Telephone Encounter (Signed)
Would she qualify for eliquis 2.5 BID? Now with nosebleeds.

## 2016-01-01 NOTE — Telephone Encounter (Signed)
Pt states Dr Lucia Gaskins recommended holding her nose for 10 minutes to stop nosebleed, if it has not stopped after 10 minutes to use Afrin nasal spray, contact her cardiologist for further recommendations.  Pt states she is concerned because the nose bleeds can last longer than 10 minutes even after using Afrin nasal spray, and they can happen at any time.  Pt asking for further recommendations to help manage nose bleeds and is asking if her Eliquis should be adjusted.   Pt advised I will forward to Dr Irish Lack for review.

## 2016-01-01 NOTE — Telephone Encounter (Signed)
Pt states she has had 3 nosebleeds, the last time was Sunday morning.  Pt states she saw Dr Lucia Gaskins, ENT 12/15/15.

## 2016-01-03 NOTE — Telephone Encounter (Signed)
I will forward to Dr Irish Lack for review

## 2016-01-03 NOTE — Telephone Encounter (Signed)
With her SCr of 0.8 and weight of 65kg, 5mg  BID dosing is more appropriate. Would hesitate to dose reduce d/t increased risk of stroke, especially since pt has already had a TIA. However, could switch pt to Xarelto. Her CrCl is 48.16mL/min which would qualify her for reduced Xarelto dose of 15mg  daily with supper if she is ok with switching. If her nosebleeds persist, would have her follow up with ENT again.

## 2016-01-04 ENCOUNTER — Emergency Department (HOSPITAL_COMMUNITY)
Admission: EM | Admit: 2016-01-04 | Discharge: 2016-01-04 | Disposition: A | Discharge status: Home / Self Care | Payer: Medicare Other | Attending: Emergency Medicine | Admitting: Emergency Medicine

## 2016-01-04 ENCOUNTER — Encounter (HOSPITAL_COMMUNITY): Payer: Self-pay | Admitting: Nurse Practitioner

## 2016-01-04 ENCOUNTER — Emergency Department (HOSPITAL_COMMUNITY): Payer: Medicare Other

## 2016-01-04 DIAGNOSIS — J45909 Unspecified asthma, uncomplicated: Secondary | ICD-10-CM | POA: Diagnosis not present

## 2016-01-04 DIAGNOSIS — Z7901 Long term (current) use of anticoagulants: Secondary | ICD-10-CM | POA: Insufficient documentation

## 2016-01-04 DIAGNOSIS — S51012A Laceration without foreign body of left elbow, initial encounter: Secondary | ICD-10-CM | POA: Diagnosis not present

## 2016-01-04 DIAGNOSIS — W0110XA Fall on same level from slipping, tripping and stumbling with subsequent striking against unspecified object, initial encounter: Secondary | ICD-10-CM | POA: Insufficient documentation

## 2016-01-04 DIAGNOSIS — Y939 Activity, unspecified: Secondary | ICD-10-CM | POA: Diagnosis not present

## 2016-01-04 DIAGNOSIS — S86912A Strain of unspecified muscle(s) and tendon(s) at lower leg level, left leg, initial encounter: Secondary | ICD-10-CM

## 2016-01-04 DIAGNOSIS — S62602A Fracture of unspecified phalanx of right middle finger, initial encounter for closed fracture: Secondary | ICD-10-CM

## 2016-01-04 DIAGNOSIS — S4991XA Unspecified injury of right shoulder and upper arm, initial encounter: Secondary | ICD-10-CM | POA: Diagnosis not present

## 2016-01-04 DIAGNOSIS — S59902A Unspecified injury of left elbow, initial encounter: Secondary | ICD-10-CM | POA: Diagnosis not present

## 2016-01-04 DIAGNOSIS — S161XXA Strain of muscle, fascia and tendon at neck level, initial encounter: Secondary | ICD-10-CM

## 2016-01-04 DIAGNOSIS — S59901A Unspecified injury of right elbow, initial encounter: Secondary | ICD-10-CM | POA: Diagnosis not present

## 2016-01-04 DIAGNOSIS — Y999 Unspecified external cause status: Secondary | ICD-10-CM | POA: Insufficient documentation

## 2016-01-04 DIAGNOSIS — S62612A Displaced fracture of proximal phalanx of right middle finger, initial encounter for closed fracture: Secondary | ICD-10-CM | POA: Insufficient documentation

## 2016-01-04 DIAGNOSIS — M7989 Other specified soft tissue disorders: Secondary | ICD-10-CM | POA: Diagnosis not present

## 2016-01-04 DIAGNOSIS — Z8673 Personal history of transient ischemic attack (TIA), and cerebral infarction without residual deficits: Secondary | ICD-10-CM | POA: Insufficient documentation

## 2016-01-04 DIAGNOSIS — M25511 Pain in right shoulder: Secondary | ICD-10-CM | POA: Diagnosis not present

## 2016-01-04 DIAGNOSIS — M79644 Pain in right finger(s): Secondary | ICD-10-CM | POA: Diagnosis not present

## 2016-01-04 DIAGNOSIS — S46911A Strain of unspecified muscle, fascia and tendon at shoulder and upper arm level, right arm, initial encounter: Secondary | ICD-10-CM

## 2016-01-04 DIAGNOSIS — Z85828 Personal history of other malignant neoplasm of skin: Secondary | ICD-10-CM | POA: Insufficient documentation

## 2016-01-04 DIAGNOSIS — M542 Cervicalgia: Secondary | ICD-10-CM | POA: Diagnosis not present

## 2016-01-04 DIAGNOSIS — Y92481 Parking lot as the place of occurrence of the external cause: Secondary | ICD-10-CM | POA: Diagnosis not present

## 2016-01-04 DIAGNOSIS — M25462 Effusion, left knee: Secondary | ICD-10-CM | POA: Diagnosis not present

## 2016-01-04 DIAGNOSIS — W19XXXA Unspecified fall, initial encounter: Secondary | ICD-10-CM

## 2016-01-04 DIAGNOSIS — S6991XA Unspecified injury of right wrist, hand and finger(s), initial encounter: Secondary | ICD-10-CM | POA: Diagnosis present

## 2016-01-04 DIAGNOSIS — S51011A Laceration without foreign body of right elbow, initial encounter: Secondary | ICD-10-CM | POA: Diagnosis not present

## 2016-01-04 DIAGNOSIS — S0990XA Unspecified injury of head, initial encounter: Secondary | ICD-10-CM | POA: Diagnosis not present

## 2016-01-04 DIAGNOSIS — M25522 Pain in left elbow: Secondary | ICD-10-CM | POA: Diagnosis not present

## 2016-01-04 NOTE — ED Notes (Signed)
Pt reports pain and weakness in her L leg since she exercised on a stationary bike this week. Today, the leg gave out and she fell over a curb in a parking lot.She c/o posterior head and neck pain, bilateral elbow pain, R middle finger pain since the fall. She denies LOC. She is on eliquis. She is laert, ambulatory, mae.

## 2016-01-04 NOTE — Telephone Encounter (Signed)
**Note De-identified Ula Couvillon Obfuscation** Please advise 

## 2016-01-04 NOTE — Telephone Encounter (Signed)
Discussed changing from Eliquis to Xarelto with patient.  Pt states she has not had anymore nosebleeds. Pt states she will continue Eliquis 5mg  bid for now. Pt states she will call back if she has more problems with nosebleeds and decides she wants to change from Eliquis to Xarelto.   If pt does change from Eliquis to Xarelto, pt will need a CBCd/BMET 1 month after changing to [place] per [facility].

## 2016-01-04 NOTE — ED Provider Notes (Signed)
CSN: [id number]     Arrival date & time 01/04/16  1702 History   First MD Initiated Contact with Patient 01/04/16 1826     Chief Complaint  Patient presents with  . Fall  Pt is an 82 yo wf who said that she's had weakness to her left knee for the past 2 weeks.  She said she stepped down from a curb with her left leg and it gave out on her.  Pt c/o pain to both elbows, right middle finger, neck, right shoulder, left knee pain.  Pt did hit her head and is on Eliquis.  She had no loc.     (Consider location/radiation/quality/duration/timing/severity/associated sxs/prior Treatment) Patient is a 82 y.o. female presenting with fall. The history is provided by the patient.  Fall This is a new problem. The current episode started 1 to 2 hours ago. Associated symptoms include headaches.    Past Medical History  Diagnosis Date  . Allergy     allergic rhinitis  . Cancer [facility])     history of skin cancer of the nose  . Asthma 2000    mild. tried Advair after bronchospasm after exposure to cats  . Shoulder pain, left 05/2010    with rotator cuff tear--Dr Alfonso Ramus  . Paroxysmal atrial fibrillation (HCC)     pt does not want anticoagulation  . Aortic stenosis     EF normal.  Mild aortic stenosis. Mild to moderate aortic regurgitation  . Anxiety   . Shortness of breath     occ  . GERD (gastroesophageal reflux disease)     occ  . Arthritis   . Stroke [facility])    Past Surgical History  Procedure Laterality Date  . Hernia repair  2003  . Acromioplasty  2000    right shoulder, arthroscopic  . Lumbar laminectomy/decompression microdiscectomy  04/01/2012    Procedure: LUMBAR LAMINECTOMY/DECOMPRESSION MICRODISCECTOMY 1 LEVEL;  Surgeon: Eustace Moore, MD;  Location: [facility];  Service: Neurosurgery;  Laterality: Right;  Right Lumbar four-five extraforaminal microdiskectomy    Family History  Problem Relation Age of Onset  . Heart attack Neg Hx   . Stroke Sister   . Hypertension Neg Hx     Social History  Substance Use Topics  . Smoking status: Never Smoker   . Smokeless tobacco: Never Used     Comment: occ wine  . Alcohol Use: 0.0 oz/week    0 Standard drinks or equivalent per week     Comment: OCC   OB History    No data available     Review of Systems  Musculoskeletal: Positive for neck pain.       Bilateral elbow, right shoulder, left knee  Neurological: Positive for headaches.  All other systems reviewed and are negative.     Allergies  Tape  Home Medications   Prior to Admission medications   Medication Sig Start Date End Date Taking? Authorizing Provider  apixaban (ELIQUIS) 5 MG TABS tablet Take 1 tablet (5 mg total) by mouth 2 (two) times daily. 07/28/15  Yes Daniel J Angiulli, PA-C  B Complex-C (B-COMPLEX WITH VITAMIN C) tablet Take 1 tablet by mouth daily.   Yes Historical Provider, MD  diltiazem (CARDIZEM CD) 180 MG 24 hr capsule Take 1 capsule (180 mg total) by mouth daily. 07/28/15  Yes Daniel J Angiulli, PA-C  mometasone-formoterol (DULERA) 100-5 MCG/ACT AERO Inhale 2 puffs into the lungs 2 (two) times daily as needed for wheezing.    Yes Historical  Provider, MD  PROAIR HFA 108 [phone] Base) MCG/ACT inhaler Inhale 1-2 puffs into the lungs every 4 (four) hours as needed for wheezing or shortness of breath. 07/28/15  Yes Daniel J Angiulli, PA-C  sertraline (ZOLOFT) 50 MG tablet Take 1 tablet (50 mg total) by mouth daily. 07/28/15  Yes Daniel J Angiulli, PA-C   BP 133/83 mmHg  Pulse 70  Temp(Src) 97.5 F (36.4 C) (Oral)  Resp 16  SpO2 98% Physical Exam  Constitutional: She is oriented to person, place, and time. She appears well-developed and well-nourished.  HENT:  Head: Normocephalic and atraumatic.  Right Ear: External ear normal.  Left Ear: External ear normal.  Nose: Nose normal.  Mouth/Throat: Oropharynx is clear and moist.  Eyes: Conjunctivae and EOM are normal. Pupils are equal, round, and reactive to light.  Neck: Normal range of  motion. Neck supple.  Cardiovascular: Normal rate, regular rhythm, normal heart sounds and intact distal pulses.   Pulmonary/Chest: Effort normal and breath sounds normal.  Abdominal: Soft. Bowel sounds are normal.  Musculoskeletal: Normal range of motion.  Small skin tears to both elbows, left knee tenderness w/o decreased rom or deformity.  Right shoulder tenderness w/o decreased rom or deformity.    Neurological: She is alert and oriented to person, place, and time.  Skin: Skin is warm and dry.  Psychiatric: She has a normal mood and affect. Her behavior is normal. Judgment and thought content normal.  Nursing note and vitals reviewed.   ED Course  Procedures (including critical care time) Labs Review Labs Reviewed - No data to display  Imaging Review Dg Shoulder Right  01/04/2016  CLINICAL DATA:  Fall over a curb today in parking lot, right shoulder pain. EXAM: RIGHT SHOULDER - 2+ VIEW COMPARISON:  Radiograph 10/16/2015 FINDINGS: No acute fracture or dislocation. Chronic glenohumeral osteoarthritis. Chronic acromioclavicular osteoarthritis. High-riding humeral head. IMPRESSION: No acute fracture or dislocation of the right shoulder. Stable degenerative change. Electronically Signed   By: Jeb Levering M.D.   On: 01/04/2016 19:45   Dg Elbow Complete Left  01/04/2016  CLINICAL DATA:  [place] going down stairs.  Elbow pain. EXAM: LEFT ELBOW - COMPLETE 3+ VIEW COMPARISON:  None. FINDINGS: There is no evidence of fracture, dislocation, or joint effusion. There is no evidence of arthropathy or other focal bone abnormality. Soft tissues are unremarkable. IMPRESSION: Negative. Electronically Signed   By: Nelson Chimes M.D.   On: 01/04/2016 18:38   Dg Elbow Complete Right  01/04/2016  CLINICAL DATA:  [place] going down stairs. EXAM: RIGHT ELBOW - COMPLETE 3+ VIEW COMPARISON:  None. FINDINGS: There is no evidence of fracture, dislocation, or joint effusion. There is no evidence of arthropathy or other  focal bone abnormality. Soft tissues are unremarkable. IMPRESSION: Negative. Electronically Signed   By: Nelson Chimes M.D.   On: 01/04/2016 18:39   Ct Head Wo Contrast  01/04/2016  CLINICAL DATA:  Loss of balance with fall striking the posterior head. Annie coagulated. Posterior neck pain. EXAM: CT HEAD WITHOUT CONTRAST CT CERVICAL SPINE WITHOUT CONTRAST TECHNIQUE: Multidetector CT imaging of the head and cervical spine was performed following the standard protocol without intravenous contrast. Multiplanar CT image reconstructions of the cervical spine were also generated. COMPARISON:  02/14/2015. Multiple previous studies as distant as 12/21/2013 FINDINGS: CT HEAD FINDINGS The brain shows generalized atrophy. There chronic small-vessel ischemic changes throughout the cerebral hemispheric white matter. There are old small vessel ischemic changes affecting the basal ganglia and thalami. No sign of acute  infarction, mass lesion, hemorrhage, hydrocephalus or extra-axial collection. No acute skull fracture. Old fracture of the right occipital bone is unchanged. This occurred on 01/02/2015. No fluid in the sinuses, middle ears or mastoids. There is atherosclerotic calcification of the major vessels at the base of the brain. CT CERVICAL SPINE FINDINGS No fracture or traumatic malalignment. Kyphotic curvature in the cervical spine due to chronic degenerative disease. Osteoarthritis of the C1-2 articulation without encroachment upon the neural spaces. Advanced facet arthropathy at C2-3 with 6 mm of anterolisthesis. Narrowing of both neural foramina. Degenerative spondylosis at C3-4, C4-5, C5-6, C6-7 and C7-T1 with disc space narrowing and osteophyte formation. Canal stenosis because of encroachment by a osteophytes. Foraminal stenosis bilaterally because of osteophytic encroachment. IMPRESSION: Head CT: No acute or traumatic finding. Atrophy and chronic small-vessel ischemic changes. Old right occipital skull fracture.  Cervical spine CT: No fracture or acute traumatic finding. Advanced degenerative changes throughout the cervical spine. Facet arthropathy at C2-3 with anterolisthesis of 6 mm. Degenerative spondylosis from C3-C7 with osteophytic encroachment upon the canal and canal stenosis, most pronounced at C4-5 and C5-6. Electronically Signed   By: Nelson Chimes M.D.   On: 01/04/2016 18:46   Ct Cervical Spine Wo Contrast  01/04/2016  CLINICAL DATA:  Loss of balance with fall striking the posterior head. Annie coagulated. Posterior neck pain. EXAM: CT HEAD WITHOUT CONTRAST CT CERVICAL SPINE WITHOUT CONTRAST TECHNIQUE: Multidetector CT imaging of the head and cervical spine was performed following the standard protocol without intravenous contrast. Multiplanar CT image reconstructions of the cervical spine were also generated. COMPARISON:  02/14/2015. Multiple previous studies as distant as 12/21/2013 FINDINGS: CT HEAD FINDINGS The brain shows generalized atrophy. There chronic small-vessel ischemic changes throughout the cerebral hemispheric white matter. There are old small vessel ischemic changes affecting the basal ganglia and thalami. No sign of acute infarction, mass lesion, hemorrhage, hydrocephalus or extra-axial collection. No acute skull fracture. Old fracture of the right occipital bone is unchanged. This occurred on 01/02/2015. No fluid in the sinuses, middle ears or mastoids. There is atherosclerotic calcification of the major vessels at the base of the brain. CT CERVICAL SPINE FINDINGS No fracture or traumatic malalignment. Kyphotic curvature in the cervical spine due to chronic degenerative disease. Osteoarthritis of the C1-2 articulation without encroachment upon the neural spaces. Advanced facet arthropathy at C2-3 with 6 mm of anterolisthesis. Narrowing of both neural foramina. Degenerative spondylosis at C3-4, C4-5, C5-6, C6-7 and C7-T1 with disc space narrowing and osteophyte formation. Canal stenosis because  of encroachment by a osteophytes. Foraminal stenosis bilaterally because of osteophytic encroachment. IMPRESSION: Head CT: No acute or traumatic finding. Atrophy and chronic small-vessel ischemic changes. Old right occipital skull fracture. Cervical spine CT: No fracture or acute traumatic finding. Advanced degenerative changes throughout the cervical spine. Facet arthropathy at C2-3 with anterolisthesis of 6 mm. Degenerative spondylosis from C3-C7 with osteophytic encroachment upon the canal and canal stenosis, most pronounced at C4-5 and C5-6. Electronically Signed   By: Nelson Chimes M.D.   On: 01/04/2016 18:46   Dg Knee Complete 4 Views Left  01/04/2016  CLINICAL DATA:  Fall over curb today in parking lot, left knee pain. EXAM: LEFT KNEE - COMPLETE 4+ VIEW COMPARISON:  Radiographs 12/21/2013 FINDINGS: No acute fracture or dislocation. There is a small joint effusion. Joint space narrowing and peripheral spurring in the medial tibial femoral compartment. Minimal patellar spurring. Vascular calcifications are seen. Diffuse soft tissue edema. IMPRESSION: No acute fracture or dislocation of the left knee.  Small joint effusion. Electronically Signed   By: Jeb Levering M.D.   On: 01/04/2016 19:47   Dg Finger Middle Right  01/04/2016  CLINICAL DATA:  [place] going down stairs. Pain and swelling of the middle finger. EXAM: RIGHT MIDDLE FINGER 2+V COMPARISON:  None. FINDINGS: No fractures seen on the AP or oblique view. The lateral it is off axis. Cannot completely rule out small fracture at the proximal volar corner of the middle phalanx. IMPRESSION: No definite fracture. Off axis lateral view. Cannot rule out small fracture at the proximal volar corner of the middle phalanx. Electronically Signed   By: Nelson Chimes M.D.   On: 01/04/2016 18:37   I have personally reviewed and evaluated these images and lab results as part of my medical decision-making.   EKG Interpretation None      MDM  Pt is awake and  alert and is eating dinner.  She is in no distress.  Her right middle finger will be splinted and she is instr to f/u with ortho.  She knows to return if worse.  Final diagnoses:  Fall, initial encounter  Fracture of phalanx of right middle finger, closed, initial encounter  Skin tear of elbow without complication, left, initial encounter  Skin tear of elbow without complication, right, initial encounter  Knee strain, left, initial encounter  Cervical strain, acute, initial encounter  Shoulder strain, right, initial encounter  Head injury due to trauma, initial encounter        Isla Pence, MD 01/04/16 2000

## 2016-01-04 NOTE — Telephone Encounter (Signed)
OK to switch to Xarelto if dosing is appropriate.

## 2016-01-09 DIAGNOSIS — M25521 Pain in right elbow: Secondary | ICD-10-CM | POA: Diagnosis not present

## 2016-01-09 DIAGNOSIS — M25561 Pain in right knee: Secondary | ICD-10-CM | POA: Diagnosis not present

## 2016-01-16 DIAGNOSIS — R531 Weakness: Secondary | ICD-10-CM | POA: Diagnosis not present

## 2016-01-16 DIAGNOSIS — W19XXXA Unspecified fall, initial encounter: Secondary | ICD-10-CM | POA: Diagnosis not present

## 2016-01-16 DIAGNOSIS — J309 Allergic rhinitis, unspecified: Secondary | ICD-10-CM | POA: Diagnosis not present

## 2016-01-16 DIAGNOSIS — R21 Rash and other nonspecific skin eruption: Secondary | ICD-10-CM | POA: Diagnosis not present

## 2016-01-17 DIAGNOSIS — L570 Actinic keratosis: Secondary | ICD-10-CM | POA: Diagnosis not present

## 2016-01-17 DIAGNOSIS — L820 Inflamed seborrheic keratosis: Secondary | ICD-10-CM | POA: Diagnosis not present

## 2016-01-17 DIAGNOSIS — L821 Other seborrheic keratosis: Secondary | ICD-10-CM | POA: Diagnosis not present

## 2016-01-17 DIAGNOSIS — D225 Melanocytic nevi of trunk: Secondary | ICD-10-CM | POA: Diagnosis not present

## 2016-01-17 DIAGNOSIS — Z85828 Personal history of other malignant neoplasm of skin: Secondary | ICD-10-CM | POA: Diagnosis not present

## 2016-01-19 ENCOUNTER — Telehealth: Payer: Self-pay | Admitting: Interventional Cardiology

## 2016-01-19 NOTE — Telephone Encounter (Signed)
**Note De-Identified Caroline Myers Obfuscation** The pt gave permission over the phone for me to speak to her daughter, [name]. [name] states that the pt has developed a rash that they believe is caused by Diltiazem. She states that their pharmacist told the pt that she will need to taper off Diltiazem slowly but her PCP said she did not have to and can stop taking immediately. [name] is requesting advise on how the pt should stop taking Diltiazem. She is advised that I will speak to our DOD and call her back.

## 2016-01-19 NOTE — Telephone Encounter (Signed)
New Message  Pt dtr calling to speak w/ RN- wanting to stop cardizem. Please call back and discuss.

## 2016-01-19 NOTE — Telephone Encounter (Signed)
Caroline Myers is advised per Dr Meda Coffee, DOD, that it is ok for the pt to stop taking Diltiazem without tapering off. Caroline Myers verbalized understanding and thanked me for my assistance.

## 2016-01-23 DIAGNOSIS — M25561 Pain in right knee: Secondary | ICD-10-CM | POA: Diagnosis not present

## 2016-01-23 DIAGNOSIS — M25461 Effusion, right knee: Secondary | ICD-10-CM | POA: Diagnosis not present

## 2016-01-23 DIAGNOSIS — W101XXA Fall (on)(from) sidewalk curb, initial encounter: Secondary | ICD-10-CM | POA: Diagnosis not present

## 2016-01-24 DIAGNOSIS — S61012A Laceration without foreign body of left thumb without damage to nail, initial encounter: Secondary | ICD-10-CM | POA: Diagnosis not present

## 2016-01-25 DIAGNOSIS — M6281 Muscle weakness (generalized): Secondary | ICD-10-CM | POA: Diagnosis not present

## 2016-01-25 DIAGNOSIS — M25561 Pain in right knee: Secondary | ICD-10-CM | POA: Diagnosis not present

## 2016-01-25 DIAGNOSIS — M25461 Effusion, right knee: Secondary | ICD-10-CM | POA: Diagnosis not present

## 2016-01-29 DIAGNOSIS — M6281 Muscle weakness (generalized): Secondary | ICD-10-CM | POA: Diagnosis not present

## 2016-01-29 DIAGNOSIS — M25461 Effusion, right knee: Secondary | ICD-10-CM | POA: Diagnosis not present

## 2016-01-29 DIAGNOSIS — M25561 Pain in right knee: Secondary | ICD-10-CM | POA: Diagnosis not present

## 2016-02-01 DIAGNOSIS — M25561 Pain in right knee: Secondary | ICD-10-CM | POA: Diagnosis not present

## 2016-02-01 DIAGNOSIS — M6281 Muscle weakness (generalized): Secondary | ICD-10-CM | POA: Diagnosis not present

## 2016-02-01 DIAGNOSIS — M25461 Effusion, right knee: Secondary | ICD-10-CM | POA: Diagnosis not present

## 2016-02-02 DIAGNOSIS — H02105 Unspecified ectropion of left lower eyelid: Secondary | ICD-10-CM | POA: Diagnosis not present

## 2016-02-05 DIAGNOSIS — Y999 Unspecified external cause status: Secondary | ICD-10-CM | POA: Diagnosis not present

## 2016-02-05 DIAGNOSIS — S4991XA Unspecified injury of right shoulder and upper arm, initial encounter: Secondary | ICD-10-CM | POA: Diagnosis present

## 2016-02-05 DIAGNOSIS — T148 Other injury of unspecified body region: Secondary | ICD-10-CM | POA: Diagnosis not present

## 2016-02-05 DIAGNOSIS — W1800XA Striking against unspecified object with subsequent fall, initial encounter: Secondary | ICD-10-CM | POA: Diagnosis not present

## 2016-02-05 DIAGNOSIS — J45909 Unspecified asthma, uncomplicated: Secondary | ICD-10-CM | POA: Diagnosis not present

## 2016-02-05 DIAGNOSIS — Z7901 Long term (current) use of anticoagulants: Secondary | ICD-10-CM | POA: Diagnosis not present

## 2016-02-05 DIAGNOSIS — Y92009 Unspecified place in unspecified non-institutional (private) residence as the place of occurrence of the external cause: Secondary | ICD-10-CM | POA: Diagnosis not present

## 2016-02-05 DIAGNOSIS — I5031 Acute diastolic (congestive) heart failure: Secondary | ICD-10-CM | POA: Diagnosis not present

## 2016-02-05 DIAGNOSIS — Z85820 Personal history of malignant melanoma of skin: Secondary | ICD-10-CM | POA: Insufficient documentation

## 2016-02-05 DIAGNOSIS — R791 Abnormal coagulation profile: Secondary | ICD-10-CM | POA: Diagnosis not present

## 2016-02-05 DIAGNOSIS — R51 Headache: Secondary | ICD-10-CM | POA: Diagnosis not present

## 2016-02-05 DIAGNOSIS — M542 Cervicalgia: Secondary | ICD-10-CM | POA: Diagnosis not present

## 2016-02-05 DIAGNOSIS — I110 Hypertensive heart disease with heart failure: Secondary | ICD-10-CM | POA: Insufficient documentation

## 2016-02-05 DIAGNOSIS — S42011A Anterior displaced fracture of sternal end of right clavicle, initial encounter for closed fracture: Secondary | ICD-10-CM | POA: Diagnosis not present

## 2016-02-05 DIAGNOSIS — S42017A Nondisplaced fracture of sternal end of right clavicle, initial encounter for closed fracture: Secondary | ICD-10-CM | POA: Insufficient documentation

## 2016-02-05 DIAGNOSIS — Y939 Activity, unspecified: Secondary | ICD-10-CM | POA: Diagnosis not present

## 2016-02-05 DIAGNOSIS — M25511 Pain in right shoulder: Secondary | ICD-10-CM | POA: Diagnosis not present

## 2016-02-05 DIAGNOSIS — S199XXA Unspecified injury of neck, initial encounter: Secondary | ICD-10-CM | POA: Diagnosis not present

## 2016-02-05 DIAGNOSIS — S0990XA Unspecified injury of head, initial encounter: Secondary | ICD-10-CM | POA: Diagnosis not present

## 2016-02-05 NOTE — ED Triage Notes (Addendum)
Pt presents from home after having a recent hx of falls. States she will be standing and randomly fall without notice. No syncope. Pt has seen a PCP for this but has not found the source. [place] tonight and landed on her R shoulder which is swollen and deformed. Abrasions on knees. States that she did hit her head and has a hx of intracranial bleeding as she is on blood thinners. Alert and oriented.

## 2016-02-06 ENCOUNTER — Emergency Department (HOSPITAL_COMMUNITY)
Admission: EM | Admit: 2016-02-06 | Discharge: 2016-02-06 | Disposition: A | Discharge status: Home / Self Care | Payer: Medicare Other | Attending: Emergency Medicine | Admitting: Emergency Medicine

## 2016-02-06 ENCOUNTER — Emergency Department (HOSPITAL_COMMUNITY): Payer: Medicare Other

## 2016-02-06 ENCOUNTER — Encounter (HOSPITAL_COMMUNITY): Payer: Self-pay | Admitting: Emergency Medicine

## 2016-02-06 DIAGNOSIS — M542 Cervicalgia: Secondary | ICD-10-CM | POA: Diagnosis not present

## 2016-02-06 DIAGNOSIS — Z7901 Long term (current) use of anticoagulants: Secondary | ICD-10-CM

## 2016-02-06 DIAGNOSIS — S42017A Nondisplaced fracture of sternal end of right clavicle, initial encounter for closed fracture: Secondary | ICD-10-CM | POA: Diagnosis not present

## 2016-02-06 DIAGNOSIS — S42011A Anterior displaced fracture of sternal end of right clavicle, initial encounter for closed fracture: Secondary | ICD-10-CM

## 2016-02-06 DIAGNOSIS — Y92009 Unspecified place in unspecified non-institutional (private) residence as the place of occurrence of the external cause: Secondary | ICD-10-CM

## 2016-02-06 DIAGNOSIS — M25511 Pain in right shoulder: Secondary | ICD-10-CM | POA: Diagnosis not present

## 2016-02-06 DIAGNOSIS — R51 Headache: Secondary | ICD-10-CM | POA: Diagnosis not present

## 2016-02-06 DIAGNOSIS — S4991XA Unspecified injury of right shoulder and upper arm, initial encounter: Secondary | ICD-10-CM | POA: Diagnosis not present

## 2016-02-06 DIAGNOSIS — S199XXA Unspecified injury of neck, initial encounter: Secondary | ICD-10-CM | POA: Diagnosis not present

## 2016-02-06 DIAGNOSIS — S0990XA Unspecified injury of head, initial encounter: Secondary | ICD-10-CM | POA: Diagnosis not present

## 2016-02-06 DIAGNOSIS — W19XXXA Unspecified fall, initial encounter: Secondary | ICD-10-CM

## 2016-02-06 HISTORY — DX: Unspecified focal traumatic brain injury with loss of consciousness of unspecified duration, initial encounter: S06.309A

## 2016-02-06 MED ORDER — ONDANSETRON HCL 4 MG PO TABS: 4.0000 mg | ORAL_TABLET | Freq: Four times a day (QID) | ORAL | 0 refills | Status: DC | PRN

## 2016-02-06 MED ORDER — ONDANSETRON 8 MG PO TBDP
8.0000 mg | ORAL_TABLET | Freq: Once | ORAL | Status: AC
  Administered 2016-02-06: 8 mg via ORAL
  Filled 2016-02-06: qty 1

## 2016-02-06 MED ORDER — OXYCODONE-ACETAMINOPHEN 5-325 MG PO TABS: 1.0000 | ORAL_TABLET | ORAL | 0 refills | Status: DC | PRN

## 2016-02-06 MED ORDER — OXYCODONE-ACETAMINOPHEN 5-325 MG PO TABS
1.0000 | ORAL_TABLET | Freq: Once | ORAL | Status: AC
  Administered 2016-02-06: 1 via ORAL
  Filled 2016-02-06: qty 1

## 2016-02-06 NOTE — Discharge Instructions (Signed)
Wear the sling as needed.

## 2016-02-06 NOTE — ED Provider Notes (Addendum)
[facility] DEPT Provider Note   CSN: [id number] Arrival date & time: 02/05/16  2356  By signing my name below, I, Caroline Myers, attest that this documentation has been prepared under the direction and in the presence of [name], MD. Electronically Signed: Irene Myers, ED Scribe. 02/06/16. 12:59 AM.  First Provider Contact:  First MD Initiated Contact with Patient 02/06/16 0032     History   Chief Complaint Chief Complaint  Patient presents with  . Fall   The history is provided by the patient. No language interpreter was used.  HPI Comments: Caroline Myers is a 82 y.o. Female with a hx of cancer, A-Fib, stroke, CVA, HTN, SAH, SDH, TIA, CHF, atherosclerosis, and debility brought in by EMS who presents to the Emergency Department complaining of a fall onset. Pt reports that she has been having a recent hx of frequent falls. She will stand and then fall without notice. She fell tonight and landed on her right shoulder. She reports that she hit her head, swelling to the shoulder, and has abrasions to the bilateral knees. She is on blood thinners and has a hx of intracranial bleeding. She denies dizziness, weakness, or LOC. Pt is not allergic to medications.   Past Medical History:  Diagnosis Date  . Allergy    allergic rhinitis  . Anxiety   . Aortic stenosis    EF normal.  Mild aortic stenosis. Mild to moderate aortic regurgitation  . Arthritis   . Asthma 2000   mild. tried Advair after bronchospasm after exposure to cats  . Cancer [facility])    history of skin cancer of the nose  . GERD (gastroesophageal reflux disease)    occ  . Paroxysmal atrial fibrillation (HCC)    pt does not want anticoagulation  . Shortness of breath    occ  . Shoulder pain, left 05/2010   with rotator cuff tear--Dr Alfonso Ramus  . Stroke [facility])     Patient Active Problem List   Diagnosis Date Noted  . Anxiety about health   . Cough   . Hypokalemia   . AKI (acute kidney injury) ([place])   . Acute  diastolic congestive heart failure ([place]) 07/20/2015  . Debility 07/20/2015  . Sepsis ([place]) 07/20/2015  . Acute on chronic diastolic congestive heart failure ([place])   . History of CVA (cerebrovascular accident)   . Tachypnea   . Leukocytosis 07/17/2015  . Arterial hypotension   . Hypotension 07/16/2015  . Elevated troponin 07/16/2015  . PNA (pneumonia)   . Severe sepsis ([place]) 07/15/2015  . Acute respiratory failure with hypercapnia ([place]) 07/15/2015  . Chronic anticoagulation 04/18/2015  . Orthostatic hypotension 03/09/2015  . CVA (cerebral infarction) 02/14/2015  . Stroke ([place]) 02/14/2015  . Chronic atrial fibrillation ([place]) 02/14/2015  . Stroke with cerebral ischemia ([place])   . Cerebral infarction due to embolism of left middle cerebral artery ([place])   . Occipital fracture ([place])   . Benign essential HTN   . Other allergic rhinitis   . Depression   . SAH (subarachnoid hemorrhage) ([place]) 01/02/2015  . SDH (subdural hematoma) ([place]) 01/02/2015  . Aortic atherosclerosis ([place]) 08/05/2014  . Nonspecific abnormal unspecified cardiovascular function study 10/28/2013  . Aortic valve disorder 10/28/2013  . HLD (hyperlipidemia) 07/28/2013  . TIA (transient ischemic attack) 07/27/2013  . History of CHF (congestive heart failure) 07/27/2013  . CVA (cerebral vascular accident) ([place]) 07/27/2013  . Urticaria 11/06/2010  . Bronchitis 10/12/2010  . Preventative health care   . Atrial fibrillation (  [place]) 04/04/2010  . Anxiety state 03/29/2010  . ATRIAL FLUTTER 03/29/2010  . RIB PAIN, LEFT SIDED 10/12/2009  . ALLERGIC RHINITIS 06/28/2009  . SHOULDER PAIN, LEFT 06/28/2009  . SCIATICA, RIGHT 06/28/2009  . HEADACHE 06/28/2009  . DYSPNEA ON EXERTION 06/28/2009  . SKIN CANCER, HX OF 06/28/2009    Past Surgical History:  Procedure Laterality Date  . ACROMIOPLASTY  2000   right shoulder, arthroscopic  . HERNIA REPAIR  2003  . LUMBAR LAMINECTOMY/DECOMPRESSION MICRODISCECTOMY  04/01/2012    Procedure: LUMBAR LAMINECTOMY/DECOMPRESSION MICRODISCECTOMY 1 LEVEL;  Surgeon: Eustace Moore, MD;  Location: [facility];  Service: Neurosurgery;  Laterality: Right;  Right Lumbar four-five extraforaminal microdiskectomy     OB History    No data available       Home Medications    Prior to Admission medications   Medication Sig Start Date End Date Taking? Authorizing Provider  apixaban (ELIQUIS) 5 MG TABS tablet Take 1 tablet (5 mg total) by mouth 2 (two) times daily. 07/28/15   Daniel J Angiulli, PA-C  B Complex-C (B-COMPLEX WITH VITAMIN C) tablet Take 1 tablet by mouth daily.    Historical Provider, MD  diltiazem (CARDIZEM CD) 180 MG 24 hr capsule Take 1 capsule (180 mg total) by mouth daily. 07/28/15   Daniel J Angiulli, PA-C  mometasone-formoterol (DULERA) 100-5 MCG/ACT AERO Inhale 2 puffs into the lungs 2 (two) times daily as needed for wheezing.     Historical Provider, MD  PROAIR HFA 108 [phone] Base) MCG/ACT inhaler Inhale 1-2 puffs into the lungs every 4 (four) hours as needed for wheezing or shortness of breath. 07/28/15   Lavon Paganini Angiulli, PA-C  sertraline (ZOLOFT) 50 MG tablet Take 1 tablet (50 mg total) by mouth daily. 07/28/15   Lavon Paganini Angiulli, PA-C    Family History Family History  Problem Relation Age of Onset  . Heart attack Neg Hx   . Stroke Sister   . Hypertension Neg Hx     Social History Social History  Substance Use Topics  . Smoking status: Never Smoker  . Smokeless tobacco: Never Used     Comment: occ wine  . Alcohol use 0.0 oz/week     Comment: OCC     Allergies   Tape   Review of Systems Review of Systems  Musculoskeletal: Positive for arthralgias and joint swelling.  Skin: Positive for color change.  Neurological: Negative for dizziness, syncope and weakness.  All other systems reviewed and are negative.    Physical Exam Updated Vital Signs BP 134/92 (BP Location: Left Arm)   Pulse 86   Temp 97.8 F (36.6 C) (Oral)   Resp 18   SpO2  95%   Physical Exam  Constitutional: She is oriented to person, place, and time. She appears well-developed and well-nourished.  HENT:  Head: Normocephalic and atraumatic.  Eyes: EOM are normal. Pupils are equal, round, and reactive to light.  Neck: Normal range of motion. Neck supple. No JVD present.  Cardiovascular: Normal rate, regular rhythm and normal heart sounds.   No murmur heard. Pulmonary/Chest: Effort normal and breath sounds normal. She has no wheezes. She has no rales. She exhibits no tenderness.  Abdominal: Soft. She exhibits no distension and no mass. There is no tenderness.  Musculoskeletal: Normal range of motion. She exhibits no edema.  Tenderness and mild swelling of the right shoulder, pain on passive ROM; distal and neurovascular intact  Lymphadenopathy:    She has no cervical adenopathy.  Neurological: She  is alert and oriented to person, place, and time. No cranial nerve deficit. She exhibits normal muscle tone. Coordination normal.  Skin: Skin is warm and dry. No rash noted.  Psychiatric: She has a normal mood and affect. Her behavior is normal. Judgment and thought content normal.  Nursing note and vitals reviewed.    ED Treatments / Results  DIAGNOSTIC STUDIES: Oxygen Saturation is 95% on RA, normal by my interpretation.    COORDINATION OF CARE: 12:51 AM-Discussed treatment plan which includes x-ray and CT scan with pt at bedside and pt agreed to plan.    Labs (all labs ordered are listed, but only abnormal results are displayed) Labs Reviewed - No data to display   Radiology Dg Shoulder Right  Result Date: 02/06/2016 CLINICAL DATA:  Fall tonight onto right side.  Right shoulder pain. EXAM: RIGHT SHOULDER - 2+ VIEW COMPARISON:  Right shoulder radiographs 01/04/2016 FINDINGS: No acute fracture or dislocation. There is superior subluxation of the humeral head consistent with chronic rotator cuff arthropathy. Proliferative change at the acromioclavicular  and glenohumeral joints. IMPRESSION: Chronic and degenerative change.  No acute fracture or subluxation. Electronically Signed   By: Jeb Levering M.D.   On: 02/06/2016 00:59   Ct Head Wo Contrast  Result Date: 02/06/2016 CLINICAL DATA:  Multiple recent falls, including fall tonight. RIGHT occipital pain radiating to RIGHT neck. History of atrial fibrillation, on blood thinners. EXAM: CT HEAD WITHOUT CONTRAST CT CERVICAL SPINE WITHOUT CONTRAST TECHNIQUE: Multidetector CT imaging of the head and cervical spine was performed following the standard protocol without intravenous contrast. Multiplanar CT image reconstructions of the cervical spine were also generated. COMPARISON:  CT HEAD and cervical spine January 04, 2016 and MRI head February 14, 2015 FINDINGS: CT HEAD FINDINGS INTRACRANIAL CONTENTS: The ventricles and sulci are normal for age. No intraparenchymal hemorrhage, mass effect nor midline shift. Patchy supratentorial white matter hypodensities are within normal range for patient's age and though non-specific likely represent chronic small vessel ischemic disease. No acute large vascular territory infarcts. Old bilateral basal ganglia lacunar infarcts. Small old bilateral cerebellar infarcts. Stable 7 mm fluid density LEFT frontal extra-axial fluid collections. Small low-density extra-axial fluid collections in posterior fossa. Basal cisterns are patent. Moderate calcific atherosclerosis of the carotid siphons. ORBITS: The included ocular globes and orbital contents are non-suspicious. Status post bilateral ocular lens implants. SINUSES: The mastoid aircells and included paranasal sinuses are well-aerated. SKULL/SOFT TISSUES: No skull fracture. No significant soft tissue swelling. CT CERVICAL SPINE FINDINGS ALIGNMENT: Broad reversed cervical lordosis. Stable grade 1 C2-3 and C3-4 anterolisthesis. OSSEOUS STRUCTURES: Vertebral bodies are intact. Severe years C4-5 through C6-7 disc height loss Old multilevel  interbody artery arthrodesis. Moderate to severe C3-4 degenerative disc. Severe C2-3 facet arthropathy. C1-2 articulation maintained with moderate arthropathy. Osteopenia without destructive bony lesions. Severe canal stenosis at C5-6. Severe neural foraminal narrowing C3-4 through C6-7. Acute comminuted nondisplaced RIGHT proximal clavicle fracture with soft tissue swelling/hematoma. SOFT TISSUES: Included prevertebral and paraspinal soft tissues are nonsuspicious. Mild vascular calcifications. IMPRESSION: CT HEAD: No acute intracranial process. Stable examination malignant small old subdural hematomas versus hygromas and old basal ganglia/ cerebellar infarcts. CT CERVICAL SPINE: No acute cervical spine fracture. Stable grade 1 C2-3 and C3-4 anterolisthesis. Severe canal stenosis at C5-6. Multilevel severe neural foraminal narrowing. Acute nondisplaced RIGHT proximal clavicle fracture. Electronically Signed   By: Elon Alas M.D.   On: 02/06/2016 01:15   Ct Cervical Spine Wo Contrast  Result Date: 02/06/2016 CLINICAL DATA:  Multiple recent  falls, including fall tonight. RIGHT occipital pain radiating to RIGHT neck. History of atrial fibrillation, on blood thinners. EXAM: CT HEAD WITHOUT CONTRAST CT CERVICAL SPINE WITHOUT CONTRAST TECHNIQUE: Multidetector CT imaging of the head and cervical spine was performed following the standard protocol without intravenous contrast. Multiplanar CT image reconstructions of the cervical spine were also generated. COMPARISON:  CT HEAD and cervical spine January 04, 2016 and MRI head February 14, 2015 FINDINGS: CT HEAD FINDINGS INTRACRANIAL CONTENTS: The ventricles and sulci are normal for age. No intraparenchymal hemorrhage, mass effect nor midline shift. Patchy supratentorial white matter hypodensities are within normal range for patient's age and though non-specific likely represent chronic small vessel ischemic disease. No acute large vascular territory infarcts. Old  bilateral basal ganglia lacunar infarcts. Small old bilateral cerebellar infarcts. Stable 7 mm fluid density LEFT frontal extra-axial fluid collections. Small low-density extra-axial fluid collections in posterior fossa. Basal cisterns are patent. Moderate calcific atherosclerosis of the carotid siphons. ORBITS: The included ocular globes and orbital contents are non-suspicious. Status post bilateral ocular lens implants. SINUSES: The mastoid aircells and included paranasal sinuses are well-aerated. SKULL/SOFT TISSUES: No skull fracture. No significant soft tissue swelling. CT CERVICAL SPINE FINDINGS ALIGNMENT: Broad reversed cervical lordosis. Stable grade 1 C2-3 and C3-4 anterolisthesis. OSSEOUS STRUCTURES: Vertebral bodies are intact. Severe years C4-5 through C6-7 disc height loss Old multilevel interbody artery arthrodesis. Moderate to severe C3-4 degenerative disc. Severe C2-3 facet arthropathy. C1-2 articulation maintained with moderate arthropathy. Osteopenia without destructive bony lesions. Severe canal stenosis at C5-6. Severe neural foraminal narrowing C3-4 through C6-7. Acute comminuted nondisplaced RIGHT proximal clavicle fracture with soft tissue swelling/hematoma. SOFT TISSUES: Included prevertebral and paraspinal soft tissues are nonsuspicious. Mild vascular calcifications. IMPRESSION: CT HEAD: No acute intracranial process. Stable examination malignant small old subdural hematomas versus hygromas and old basal ganglia/ cerebellar infarcts. CT CERVICAL SPINE: No acute cervical spine fracture. Stable grade 1 C2-3 and C3-4 anterolisthesis. Severe canal stenosis at C5-6. Multilevel severe neural foraminal narrowing. Acute nondisplaced RIGHT proximal clavicle fracture. Electronically Signed   By: Elon Alas M.D.   On: 02/06/2016 01:15    Procedures Procedures (including critical care time)  Medications Ordered in ED Medications  oxyCODONE-acetaminophen (PERCOCET/ROXICET) 5-325 MG per  tablet 1 tablet (not administered)   Initial Impression / Assessment and Plan / ED Course  I have reviewed the triage vital signs and the nursing notes.  Pertinent labs & imaging results that were available during my care of the patient were reviewed by me and considered in my medical decision making (see chart for details).  Clinical Course    Fall at home with injury to right shoulder. She is anticoagulated, so CT is obtained of the head. Shoulder x-ray shows no fracture, but fracture the proximal clavicle is identified on CT of the cervical spine. She is placed in a sling for comfort and discharged with prescription for oxycodone-acetaminophen. Advised to watch for signs of delayed head injury because of anticoagulated state..  Final Clinical Impressions(s) / ED Diagnoses   Final diagnoses:  Fall at home, initial encounter  Closed fracture of sternal end of right clavicle, initial encounter  Chronic anticoagulation    New Prescriptions Discharge Medication List as of 02/06/2016  2:21 AM    START taking these medications   Details  oxyCODONE-acetaminophen (PERCOCET) 5-325 MG tablet Take 1 tablet by mouth every 4 (four) hours as needed for moderate pain., Starting Tue 02/06/2016, Print        I personally performed the services  described in this documentation, which was scribed in my presence. The recorded information has been reviewed and is accurate.   [name], MD [id number]    [name], MD [id number]

## 2016-02-06 NOTE — ED Notes (Signed)
Patient was alert, oriented and stable upon discharge. RN went over AVS and patient had no further questions. Pt was assisted to taxi cab.

## 2016-02-06 NOTE — ED Notes (Signed)
Bed: [id number] Expected date:  Expected time:  Means of arrival:  Comments: 38 F fall

## 2016-02-06 NOTE — ED Notes (Signed)
MD at bedside. 

## 2016-02-08 DIAGNOSIS — M25511 Pain in right shoulder: Secondary | ICD-10-CM | POA: Diagnosis not present

## 2016-02-08 DIAGNOSIS — S42017A Nondisplaced fracture of sternal end of right clavicle, initial encounter for closed fracture: Secondary | ICD-10-CM | POA: Diagnosis not present

## 2016-02-13 ENCOUNTER — Ambulatory Visit (INDEPENDENT_AMBULATORY_CARE_PROVIDER_SITE_OTHER): Payer: Medicare Other | Admitting: Neurology

## 2016-02-13 ENCOUNTER — Encounter: Payer: Self-pay | Admitting: Neurology

## 2016-02-13 VITALS — BP 98/67 | HR 91 | Ht 65.5 in | Wt 137.0 lb

## 2016-02-13 DIAGNOSIS — R296 Repeated falls: Secondary | ICD-10-CM | POA: Diagnosis not present

## 2016-02-13 NOTE — Patient Instructions (Addendum)
I had a long d/w patient and husband about her recent falls, discussed differential diagnosis,her balance deficits from remotes troke, risk for recurrent stroke/TIAs, personally independently reviewed imaging studies and stroke evaluation results and answered questions.Continue Eliquis (apixaban) daily  for secondary stroke prevention and maintain strict control of hypertension with blood pressure goal below 130/90, diabetes with hemoglobin A1c goal below 6.5% and lipids with LDL cholesterol goal below 70 mg/dL. I also advised the patient fall and safety precautions. She was also advised to keep an upcoming visit with ENT and cardiology. Check MRI scan the brain and cervical spine to look for interval new strokes or significant compressive cervical spine disease. Followup in the future with nurse practitioner in 3 months or call earlier if necessary.   Fall Prevention in the Home  Falls can cause injuries. They can happen to people of all ages. There are many things you can do to make your home safe and to help prevent falls.  WHAT CAN I DO ON THE OUTSIDE OF MY HOME?  Regularly fix the edges of walkways and driveways and fix any cracks.  Remove anything that might make you trip as you walk through a door, such as a raised step or threshold.  Trim any bushes or trees on the path to your home.  Use bright outdoor lighting.  Clear any walking paths of anything that might make someone trip, such as rocks or tools.  Regularly check to see if handrails are loose or broken. Make sure that both sides of any steps have handrails.  Any raised decks and porches should have guardrails on the edges.  Have any leaves, snow, or ice cleared regularly.  Use sand or salt on walking paths during winter.  Clean up any spills in your garage right away. This includes oil or grease spills. WHAT CAN I DO IN THE BATHROOM?   Use night lights.  Install grab bars by the toilet and in the tub and shower. Do not use  towel bars as grab bars.  Use non-skid mats or decals in the tub or shower.  If you need to sit down in the shower, use a plastic, non-slip stool.  Keep the floor dry. Clean up any water that spills on the floor as soon as it happens.  Remove soap buildup in the tub or shower regularly.  Attach bath mats securely with double-sided non-slip rug tape.  Do not have throw rugs and other things on the floor that can make you trip. WHAT CAN I DO IN THE BEDROOM?  Use night lights.  Make sure that you have a light by your bed that is easy to reach.  Do not use any sheets or blankets that are too big for your bed. They should not hang down onto the floor.  Have a firm chair that has side arms. You can use this for support while you get dressed.  Do not have throw rugs and other things on the floor that can make you trip. WHAT CAN I DO IN THE KITCHEN?  Clean up any spills right away.  Avoid walking on wet floors.  Keep items that you use a lot in easy-to-reach places.  If you need to reach something above you, use a strong step stool that has a grab bar.  Keep electrical cords out of the way.  Do not use floor polish or wax that makes floors slippery. If you must use wax, use non-skid floor wax.  Do not have throw rugs  and other things on the floor that can make you trip. WHAT CAN I DO WITH MY STAIRS?  Do not leave any items on the stairs.  Make sure that there are handrails on both sides of the stairs and use them. Fix handrails that are broken or loose. Make sure that handrails are as long as the stairways.  Check any carpeting to make sure that it is firmly attached to the stairs. Fix any carpet that is loose or worn.  Avoid having throw rugs at the top or bottom of the stairs. If you do have throw rugs, attach them to the floor with carpet tape.  Make sure that you have a light switch at the top of the stairs and the bottom of the stairs. If you do not have them, ask  someone to add them for you. WHAT ELSE CAN I DO TO HELP PREVENT FALLS?  Wear shoes that:  Do not have high heels.  Have rubber bottoms.  Are comfortable and fit you well.  Are closed at the toe. Do not wear sandals.  If you use a stepladder:  Make sure that it is fully opened. Do not climb a closed stepladder.  Make sure that both sides of the stepladder are locked into place.  Ask someone to hold it for you, if possible.  Clearly mark and make sure that you can see:  Any grab bars or handrails.  First and last steps.  Where the edge of each step is.  Use tools that help you move around (mobility aids) if they are needed. These include:  Canes.  Walkers.  Scooters.  Crutches.  Turn on the lights when you go into a dark area. Replace any light bulbs as soon as they burn out.  Set up your furniture so you have a clear path. Avoid moving your furniture around.  If any of your floors are uneven, fix them.  If there are any pets around you, be aware of where they are.  Review your medicines with your doctor. Some medicines can make you feel dizzy. This can increase your chance of falling. Ask your doctor what other things that you can do to help prevent falls.   This information is not intended to replace advice given to you by your health care provider. Make sure you discuss any questions you have with your health care provider.   Document Released: 04/13/2009 Document Revised: 11/01/2014 Document Reviewed: 07/22/2014 Elsevier Interactive Patient Education [employer].

## 2016-02-13 NOTE — Progress Notes (Signed)
PATIENT: Caroline Myers DOB: [birth year]  REASON FOR VISIT: hospital stroke follow up HISTORY FROM: patient  HISTORY OF PRESENT ILLNESS: Caroline Myers is an 82 y.o. female history of atrial fibrillation not on anticoagulation who experienced an episode of difficulty with reading comprehension for about one hour starting at 2 PM on 07/26/2013. At around 6 PM she was noted by husband to be confused and was using cooking and eating utensils inappropriately. The time she arrived in the emergency room deficits have resolved. She has no previous history of stroke nor TIA. She takes aspirin 81 mg per day. She reportedly has refused anticoagulation with Coumadin. CT scan of her head showed no acute intracranial abnormality. NIH stroke score was 0. Patient was not administerd TPA secondary to rapidly resolving deficits . She was admitted for further evaluation and treatment. MRI of the brain showed small acute right cerebellar infarct. No mass effect or hemorrhage.  Several small acute to subacute lacunar infarcts in the left frontal lobe white matter, favor synchronous small vessel disease rather than embolic disease.  Underlying chronic small vessel disease, including a small chronic left cerebellar lacunar infarct.  MRA of the brain showed patent distal vertebral and basilar arteries, the left vertebral terminates in PICA. Moderate to severe bilateral PCA stenosis with preserved distal flow. Mildly dolichoectatic anterior circulation. 2D Echocardiogram showed an EF 55-60% with no source of embolus.  Upon discharge she was started on Eliquis daily, and she has tolerated this well.  They are unhappy about the cost.  She had no lasting deficits and has no repeat symptoms. Update 01/04/2014 : She returns for followup after last visit 3 months ago. She continues to do well without recurrent stroke or TIA symptoms. She is tolerating eliquis well without significant bleeding, bruising or other side effects. She  convinced to have mild gait and balance difficulties but these are unchanged. She's not had any major falls. She has had no new health problems. Update 07/20/2014 ; she returns for follow-up after last visit 6 months ago. She has not had any recurrent stroke or TIA symptoms. She however has been under significant stress due to her husband's sickness. She continues to have mild balance and gait difficulties and has had one fall since June last year. She also complains of occasional posterior headaches which are mild pressure-like in quality and 9 and 5/10 in severity. She does not need to take medications for these. She remains on eliquis which is tolerating well without significant bleeding and only minor bruising. She is also on Zoloft for depression but feels her depression is stable. She in the past used to meditate and 2 yolk but of late she has not managed to find time to do so. She does complain of mild short-term memory difficulties but these do not appear to be progressive or interfere with her activities of daily living.  Update 02/13/2016 ;  Patient returns for follow-up after last visit with me in January 2016. She unfortunately fell and fractured her clavicle a week ago. She's had 3 falls in the last 1 month. She states she has no warning before the fall and usually falls backwards. Last fall occurred when she was reaching up and  putting things in a top drawer She has had history of cerebral infarct and had some balance difficulties. She is admitted last year to the hospital in August 2016 with a left frontal MCA branch infarct from her atrial fibrillation. She saw Dr. Erlinda Hong and that  time. She was continued on eliquis which is still taking and tolerating it well but does have minor bruising the. She's had no major bleeding episodes. Patient also complains of ringing in the ears. She has an upcoming appointment with the ENT physician to discuss that. She is also seeing ophthalmologist for redness in her  eyes. REVIEW OF SYSTEMS: Full 14 system review of systems performed and notable only for:  Activity change,eyeI itching and redness, shortness of breath, easy bruising and bleeding, joint pain and swelling, neck pain and stiffness, decreased concentration, anxiety and nervousness only and all other systems negative  ALLERGIES: Allergies  Allergen Reactions  . Tape Other (See Comments)    Rips skin    HOME MEDICATIONS: Outpatient Medications Prior to Visit  Medication Sig Dispense Refill  . apixaban (ELIQUIS) 5 MG TABS tablet Take 1 tablet (5 mg total) by mouth 2 (two) times daily. 180 tablet 3  . PROAIR HFA 108 (90 Base) MCG/ACT inhaler Inhale 1-2 puffs into the lungs every 4 (four) hours as needed for wheezing or shortness of breath. 5 Inhaler 0  . sertraline (ZOLOFT) 50 MG tablet Take 1 tablet (50 mg total) by mouth daily. 30 tablet 4  . ondansetron (ZOFRAN) 4 MG tablet Take 1 tablet (4 mg total) by mouth every 6 (six) hours as needed for nausea or vomiting. (Patient not taking: Reported on 02/13/2016) 12 tablet 0  . oxyCODONE-acetaminophen (PERCOCET) 5-325 MG tablet Take 1 tablet by mouth every 4 (four) hours as needed for moderate pain. (Patient not taking: Reported on 02/13/2016) 20 tablet 0   No facility-administered medications prior to visit.      PHYSICAL EXAM  Vitals:   02/13/16 1147  BP: 98/67  Pulse: 91  Weight: 137 lb (62.1 kg)  Height: 5' 5.5" (1.664 m)   Body mass index is 22.45 kg/m.  Generalized: Frail elderly Caucasian lady, in no acute distress  Head: normocephalic and atraumatic. Oropharynx benign  Neck: Supple, no carotid bruits  Cardiac: Irrgular rate and rhythm, systolic murmur 2/6 Musculoskeletal: mild kyphoscoliotic deformity of back.Right shoulder is in a sling. Vitals:   02/13/16 1147  BP: 98/67  Pulse: 91    Neurological examination  Mentation: Alert oriented to time, place, history taking. Follows all commands speech and language  fluent Cranial nerve II-XII: Fundoscopic exam not done. Pupils were equal round reactive to light extraocular movements were full, visual field were full on confrontational test. Facial sensation and strength were normal. hearing was intact to finger rubbing bilaterally. Uvula tongue midline. head turning and shoulder shrug and were normal and symmetric.Tongue protrusion into cheek strength was normal. Motor: The motor testing reveals 5 over 5 strength of all 4 extremities. Good symmetric motor tone is noted throughout. Fine finger movements are diminished on the right. Orbits left over right upper extremity. Mild weakness of right grip. Sensory: Sensory testing is intact to pinprick, soft touch on all 4 extremities. No evidence of extinction is noted.  Coordination: Cerebellar testing reveals good finger-nose-finger and heel-to-shin bilaterally.  Gait and station: Gait is normal. Tandem gait is performed with mild difficulty . Romberg is negative.    Reflexes: Deep tendon reflexes are symmetric and normal bilaterally. Toes are downgoing bilaterally.   DIAGNOSTIC DATA (LABS, IMAGING, TESTING) - I reviewed patient records, labs, notes, testing and imaging myself where available.  Lab Results  Component Value Date   HGBA1C 6.0 (H) 02/14/2015   ASSESSMENT AND PLAN 82 y.o. year old female   with cerebellar  infarct in January 2015 due to cardiogenic embolism from atrial fibrillation. She has done well with only mild gait and balance residual deficits.  PLAN: I had a long d/w patient and husband about her recent falls, discussed differential diagnosis,her balance deficits from remotes troke, risk for recurrent stroke/TIAs, personally independently reviewed imaging studies and stroke evaluation results and answered questions.Continue Eliquis (apixaban) daily  for secondary stroke prevention and maintain strict control of hypertension with blood pressure goal below 130/90, diabetes with hemoglobin A1c  goal below 6.5% and lipids with LDL cholesterol goal below 70 mg/dL. I also advised the patient fall and safety precautions. She was also advised to keep an upcoming visit with ENT and cardiology. Check MRI scan the brain and cervical spine to look for interval new strokes or significant compressive cervical spine disease. Followup in the future with nurse practitioner in 3 months or call earlier if necessary.  Antony Contras, M.D.  02/13/2016, 12:57 PM [facility] [address], [place], [place] [address] [phone]  Note: This document was prepared with digital dictation and possible smart phrase technology. Any transcriptional errors that result from this process are unintentional.

## 2016-02-15 DIAGNOSIS — H6121 Impacted cerumen, right ear: Secondary | ICD-10-CM | POA: Diagnosis not present

## 2016-02-15 DIAGNOSIS — R42 Dizziness and giddiness: Secondary | ICD-10-CM | POA: Diagnosis not present

## 2016-02-15 DIAGNOSIS — H903 Sensorineural hearing loss, bilateral: Secondary | ICD-10-CM | POA: Diagnosis not present

## 2016-02-16 DIAGNOSIS — H11433 Conjunctival hyperemia, bilateral: Secondary | ICD-10-CM | POA: Diagnosis not present

## 2016-02-16 DIAGNOSIS — H02135 Senile ectropion of left lower eyelid: Secondary | ICD-10-CM | POA: Diagnosis not present

## 2016-02-16 DIAGNOSIS — H02132 Senile ectropion of right lower eyelid: Secondary | ICD-10-CM | POA: Diagnosis not present

## 2016-02-19 DIAGNOSIS — Z23 Encounter for immunization: Secondary | ICD-10-CM | POA: Diagnosis not present

## 2016-02-19 DIAGNOSIS — R0602 Shortness of breath: Secondary | ICD-10-CM | POA: Diagnosis not present

## 2016-02-19 DIAGNOSIS — J45909 Unspecified asthma, uncomplicated: Secondary | ICD-10-CM | POA: Diagnosis not present

## 2016-02-23 DIAGNOSIS — M25511 Pain in right shoulder: Secondary | ICD-10-CM | POA: Diagnosis not present

## 2016-02-27 ENCOUNTER — Encounter: Payer: Self-pay | Admitting: Interventional Cardiology

## 2016-03-08 ENCOUNTER — Ambulatory Visit
Admission: RE | Admit: 2016-03-08 | Discharge: 2016-03-08 | Disposition: A | Discharge status: Home / Self Care | Payer: Medicare Other | Source: Ambulatory Visit | Attending: Neurology | Admitting: Neurology

## 2016-03-08 DIAGNOSIS — R296 Repeated falls: Secondary | ICD-10-CM

## 2016-03-08 MED ORDER — GADOBENATE DIMEGLUMINE 529 MG/ML IV SOLN
12.0000 mL | Freq: Once | INTRAVENOUS | Status: AC | PRN
  Administered 2016-03-08: 12 mL via INTRAVENOUS

## 2016-03-12 DIAGNOSIS — H02105 Unspecified ectropion of left lower eyelid: Secondary | ICD-10-CM | POA: Diagnosis not present

## 2016-03-14 ENCOUNTER — Ambulatory Visit (INDEPENDENT_AMBULATORY_CARE_PROVIDER_SITE_OTHER): Payer: Medicare Other | Admitting: Interventional Cardiology

## 2016-03-14 ENCOUNTER — Encounter: Payer: Self-pay | Admitting: Interventional Cardiology

## 2016-03-14 ENCOUNTER — Other Ambulatory Visit: Payer: Self-pay

## 2016-03-14 VITALS — BP 128/90 | HR 81 | Ht 64.5 in | Wt 138.1 lb

## 2016-03-14 DIAGNOSIS — I4892 Unspecified atrial flutter: Secondary | ICD-10-CM

## 2016-03-14 DIAGNOSIS — I480 Paroxysmal atrial fibrillation: Secondary | ICD-10-CM | POA: Diagnosis not present

## 2016-03-14 DIAGNOSIS — E7800 Pure hypercholesterolemia, unspecified: Secondary | ICD-10-CM | POA: Diagnosis not present

## 2016-03-14 DIAGNOSIS — Z131 Encounter for screening for diabetes mellitus: Secondary | ICD-10-CM | POA: Diagnosis not present

## 2016-03-14 DIAGNOSIS — F419 Anxiety disorder, unspecified: Secondary | ICD-10-CM | POA: Diagnosis not present

## 2016-03-14 DIAGNOSIS — I359 Nonrheumatic aortic valve disorder, unspecified: Secondary | ICD-10-CM

## 2016-03-14 DIAGNOSIS — Z0000 Encounter for general adult medical examination without abnormal findings: Secondary | ICD-10-CM | POA: Diagnosis not present

## 2016-03-14 DIAGNOSIS — Z7901 Long term (current) use of anticoagulants: Secondary | ICD-10-CM

## 2016-03-14 DIAGNOSIS — I272 Other secondary pulmonary hypertension: Secondary | ICD-10-CM

## 2016-03-14 DIAGNOSIS — T148 Other injury of unspecified body region: Secondary | ICD-10-CM | POA: Diagnosis not present

## 2016-03-14 DIAGNOSIS — J45909 Unspecified asthma, uncomplicated: Secondary | ICD-10-CM | POA: Diagnosis not present

## 2016-03-14 NOTE — Patient Instructions (Signed)
Medication Instructions:  Same-no changes  Labwork: None  Testing/Procedures: None  Follow-Up: Your physician wants you to follow-up in: 6 months. You will receive a reminder letter in the mail two months in advance. If you don't receive a letter, please call our office to schedule the follow-up appointment.      If you need a refill on your cardiac medications before your next appointment, please call your pharmacy.   

## 2016-03-14 NOTE — Progress Notes (Signed)
Cardiology Office Note   Date:  03/14/2016   ID:  Caroline Myers, DOB 1923/12/23, MRN [id number]  PCP:   Melinda Crutch, MD    No chief complaint on file. f/u AFib   Wt Readings from Last 3 Encounters:  03/14/16 138 lb 1.9 oz (62.7 kg)  02/13/16 137 lb (62.1 kg)  09/21/15 142 lb (64.4 kg)       History of Present Illness: Caroline Myers is a 82 y.o. female who has past medical history of permanent atrial flutter on eliquis with CHA2DS2-VASC score of 5, aortic valve stenosis, CVA 2. She underwent treadmill stress test in April 2008 which shows normal perfusion study, however she had chest pain and EKG changes during the terminal portion concerning for false negative study. She had normal coronaries on cardiac catheterization in May 2008.   She was admitted in January 2017 with sepsis, hypertension, tachypnea and fever. Initial EKG showed atrial fibrillation with RVR, nonspecific T-wave abnormalities with heart rate of 130s. Cardiology was consulted during that admission. Repeat 2-D echo on 07/16/2015 showed EF 50-55%, severely dilated left atrium, moderate TR, peak PA pressure 51 mmHg. During January admission, she did have elevated troponin which was felt to be due to demand ischemia from tachypnea, febrile state, and rapid heart rate.   Since her last visit, she has been doing well. She denies any chest discomfort or shortness of breath. She has not had any palpitations. She has had a few falls. She has not hit her head of her seriously hurt herself. She has a cane that she does not like using.  No bleeding problems.      Past Medical History:  Diagnosis Date  . Allergy    allergic rhinitis  . Anxiety   . Aortic stenosis    EF normal.  Mild aortic stenosis. Mild to moderate aortic regurgitation  . Arthritis   . Asthma 2000   mild. tried Advair after bronchospasm after exposure to cats  . Cancer [facility])    history of skin cancer of the nose  . GERD (gastroesophageal  reflux disease)    occ  . Intracranial hemorrhage following injury ([facility]) 01/03/2015  . Paroxysmal atrial fibrillation ([facility])    pt does not want anticoagulation  . Shortness of breath    occ  . Shoulder pain, left 05/2010   with rotator cuff tear--Dr Alfonso Ramus  . Stroke [facility])     Past Surgical History:  Procedure Laterality Date  . ACROMIOPLASTY  2000   right shoulder, arthroscopic  . HERNIA REPAIR  2003  . LUMBAR LAMINECTOMY/DECOMPRESSION MICRODISCECTOMY  04/01/2012   Procedure: LUMBAR LAMINECTOMY/DECOMPRESSION MICRODISCECTOMY 1 LEVEL;  Surgeon: Eustace Moore, MD;  Location: [facility];  Service: Neurosurgery;  Laterality: Right;  Right Lumbar four-five extraforaminal microdiskectomy      Current Outpatient Prescriptions  Medication Sig Dispense Refill  . acetaminophen (TYLENOL) 325 MG tablet Take 650 mg by mouth every 6 (six) hours as needed (Pain).     Marland Kitchen apixaban (ELIQUIS) 5 MG TABS tablet Take 1 tablet (5 mg total) by mouth 2 (two) times daily. 180 tablet 3  . mometasone-formoterol (DULERA) 100-5 MCG/ACT AERO as directed.    . sertraline (ZOLOFT) 50 MG tablet Take 1 tablet (50 mg total) by mouth daily. 30 tablet 4  . sodium chloride (OCEAN) 0.65 % SOLN nasal spray Place 1 spray into both nostrils 3 (three) times daily as needed for congestion.    . vitamin B-12 (CYANOCOBALAMIN) 1000 MCG  tablet Take 1,000 mcg by mouth daily.     No current facility-administered medications for this visit.     Allergies:   Tape    Social History:  The patient  reports that she has never smoked. She has never used smokeless tobacco. She reports that she drinks about 0.6 oz of alcohol per week . She reports that she does not use drugs.   Family History:  The patient's family history includes Stroke in her sister.    ROS:  Please see the history of present illness.   Otherwise, review of systems are positive for occasional falls.   All other systems are reviewed and negative.    PHYSICAL  EXAM: VS:  BP 128/90   Pulse 81   Ht 5' 4.5" (1.638 m)   Wt 138 lb 1.9 oz (62.7 kg)   SpO2 94%   BMI 23.34 kg/m  , BMI Body mass index is 23.34 kg/m. GEN: Well nourished, well developed, in no acute distress  HEENT: normal  Neck: no JVD, carotid bruits, or masses Cardiac: irregularly irregular; ; 2/6 systolic  Murmur;,no rubs, or gallops,no edema  Respiratory:  clear to auscultation bilaterally, normal work of breathing GI: soft, nontender, nondistended, + BS MS: no deformity or atrophy  Skin: warm and dry, no rash Neuro:  Strength and sensation are intact Psych: euthymic mood, full affect   EKG:   The ekg ordered in March 2017 demonstrates atrial flutter, rate controlled   Recent Labs: 07/15/2015: B Natriuretic Peptide 123.7 07/18/2015: Magnesium 2.1 08/02/2015: ALT 23 09/17/2015: BUN 23; Creatinine, Ser 0.80; Hemoglobin 13.4; Platelets 207; Potassium 4.2; Sodium 141   Lipid Panel    Component Value Date/Time   CHOL 199 02/15/2015 0054   TRIG 101 02/15/2015 0054   HDL 42 02/15/2015 0054   CHOLHDL 4.7 02/15/2015 0054   VLDL 20 02/15/2015 0054   LDLCALC 137 (H) 02/15/2015 0054     Other studies Reviewed: Additional studies/ records that were reviewed today with results demonstrating: .   ASSESSMENT AND PLAN:  1. AFib/Atrial flutter: Rate controlled.  Eliquis for stroke prevention.  We reviewed her dose of Eliquis a few months ago with the Pharm.D. They did confirm that she should be on the 5 mg twice a day dose. She does have a history of CVA in the past.  I stressed the importance of avoiding falls and using her cane. 2. Aortic stenosis: No symptoms of severe aortic stenosis. 3. Pulmonary hypertension: moderate by echo in Jan 2017.  No plan for advanced treatment of PAH at this time.     Current medicines are reviewed at length with the patient today.  The patient concerns regarding her medicines were addressed.  The following changes have been made:  No  change  Labs/ tests ordered today include:  No orders of the defined types were placed in this encounter.   Recommend 150 minutes/week of aerobic exercise Low fat, low carb, high fiber diet recommended  Disposition:   FU in 6 months   Signed, Larae Grooms, MD  03/14/2016 9:25 AM    [facility] [place], [place], [place]  [address] Phone: [phone]; Fax: [phone]

## 2016-03-15 DIAGNOSIS — I272 Pulmonary hypertension, unspecified: Secondary | ICD-10-CM | POA: Insufficient documentation

## 2016-03-18 ENCOUNTER — Encounter: Payer: Self-pay | Admitting: Neurology

## 2016-03-20 DIAGNOSIS — F419 Anxiety disorder, unspecified: Secondary | ICD-10-CM | POA: Diagnosis not present

## 2016-03-20 DIAGNOSIS — Z0000 Encounter for general adult medical examination without abnormal findings: Secondary | ICD-10-CM | POA: Diagnosis not present

## 2016-03-20 DIAGNOSIS — E7800 Pure hypercholesterolemia, unspecified: Secondary | ICD-10-CM | POA: Diagnosis not present

## 2016-03-20 DIAGNOSIS — Z131 Encounter for screening for diabetes mellitus: Secondary | ICD-10-CM | POA: Diagnosis not present

## 2016-03-20 DIAGNOSIS — I480 Paroxysmal atrial fibrillation: Secondary | ICD-10-CM | POA: Diagnosis not present

## 2016-03-21 ENCOUNTER — Ambulatory Visit (INDEPENDENT_AMBULATORY_CARE_PROVIDER_SITE_OTHER): Payer: Medicare Other | Admitting: Podiatry

## 2016-03-21 ENCOUNTER — Encounter: Payer: Self-pay | Admitting: Podiatry

## 2016-03-21 VITALS — BP 131/90 | HR 80 | Resp 14

## 2016-03-21 DIAGNOSIS — B351 Tinea unguium: Secondary | ICD-10-CM | POA: Diagnosis not present

## 2016-03-21 DIAGNOSIS — M79676 Pain in unspecified toe(s): Secondary | ICD-10-CM

## 2016-03-21 DIAGNOSIS — L84 Corns and callosities: Secondary | ICD-10-CM | POA: Diagnosis not present

## 2016-03-21 NOTE — Progress Notes (Signed)
Patient ID: Caroline Myers, female   DOB: [birth year], 82 y.o.   MRN: [id number] Complaint:  Visit Type: Patient returns to my office for continued preventative foot care services. Complaint: Patient states" my nails have grown long and thick and become painful to walk and wear shoes". The patient presents for preventative foot care services. No changes to ROS.  She also says she has developed painful corns on fifth toes both feet.  Podiatric Exam: Vascular: dorsalis pedis and posterior tibial pulses are palpable bilateral. Capillary return is immediate. Temperature gradient is WNL. Skin turgor WNL  Sensorium: Normal Semmes Weinstein monofilament test. Normal tactile sensation bilaterally. Nail Exam: Pt has thick disfigured discolored nails with subungual debris noted bilateral entire nail hallux  Ulcer Exam: There is no evidence of ulcer or pre-ulcerative changes or infection. Orthopedic Exam: Muscle tone and strength are WNL. No limitations in general ROM. No crepitus or effusions noted. Foot type and digits show no abnormalities. Bony prominences are unremarkable. Skin: No Porokeratosis. No infection or ulcers.  Heloma durum fifth toe B/L  Diagnosis:  Onychomycosis, , Pain in right toe, pain in left toes,  Heloma durum fifth digit  B/L  Treatment & Plan Procedures and Treatment: Consent by patient was obtained for treatment procedures. The patient understood the discussion of treatment and procedures well. All questions were answered thoroughly reviewed. Debridement of mycotic and hypertrophic toenails, 1 through 5 bilateral and clearing of subungual debris. No ulceration, no infection noted. Debride corns. B/L B/LReturn Visit-Office Procedure: Patient instructed to return to the office for a follow up visit 3 months for continued evaluation and treatment.    Gardiner Barefoot DPM

## 2016-04-06 DIAGNOSIS — K589 Irritable bowel syndrome without diarrhea: Secondary | ICD-10-CM | POA: Diagnosis not present

## 2016-04-06 DIAGNOSIS — F439 Reaction to severe stress, unspecified: Secondary | ICD-10-CM | POA: Diagnosis not present

## 2016-04-06 DIAGNOSIS — R54 Age-related physical debility: Secondary | ICD-10-CM | POA: Diagnosis not present

## 2016-04-11 DIAGNOSIS — J449 Chronic obstructive pulmonary disease, unspecified: Secondary | ICD-10-CM | POA: Diagnosis not present

## 2016-04-11 DIAGNOSIS — R0602 Shortness of breath: Secondary | ICD-10-CM | POA: Diagnosis not present

## 2016-04-15 ENCOUNTER — Encounter (HOSPITAL_COMMUNITY): Payer: Self-pay | Admitting: Emergency Medicine

## 2016-04-15 ENCOUNTER — Emergency Department (HOSPITAL_COMMUNITY)
Admission: EM | Admit: 2016-04-15 | Discharge: 2016-04-15 | Disposition: A | Discharge status: Home / Self Care | Payer: Medicare Other | Attending: Emergency Medicine | Admitting: Emergency Medicine

## 2016-04-15 ENCOUNTER — Emergency Department (HOSPITAL_COMMUNITY): Payer: Medicare Other

## 2016-04-15 DIAGNOSIS — J441 Chronic obstructive pulmonary disease with (acute) exacerbation: Secondary | ICD-10-CM | POA: Insufficient documentation

## 2016-04-15 DIAGNOSIS — Z79899 Other long term (current) drug therapy: Secondary | ICD-10-CM | POA: Insufficient documentation

## 2016-04-15 DIAGNOSIS — I110 Hypertensive heart disease with heart failure: Secondary | ICD-10-CM | POA: Diagnosis not present

## 2016-04-15 DIAGNOSIS — R061 Stridor: Secondary | ICD-10-CM | POA: Diagnosis not present

## 2016-04-15 DIAGNOSIS — R0789 Other chest pain: Secondary | ICD-10-CM | POA: Diagnosis not present

## 2016-04-15 DIAGNOSIS — Z85828 Personal history of other malignant neoplasm of skin: Secondary | ICD-10-CM | POA: Insufficient documentation

## 2016-04-15 DIAGNOSIS — J45909 Unspecified asthma, uncomplicated: Secondary | ICD-10-CM | POA: Insufficient documentation

## 2016-04-15 DIAGNOSIS — R0602 Shortness of breath: Secondary | ICD-10-CM | POA: Diagnosis not present

## 2016-04-15 DIAGNOSIS — I5033 Acute on chronic diastolic (congestive) heart failure: Secondary | ICD-10-CM | POA: Diagnosis not present

## 2016-04-15 DIAGNOSIS — R079 Chest pain, unspecified: Secondary | ICD-10-CM | POA: Diagnosis not present

## 2016-04-15 LAB — CBC WITH DIFFERENTIAL/PLATELET
BASOS PCT: 1 %
Basophils Absolute: 0.1 10*3/uL (ref 0.0–0.1)
EOS ABS: 0.2 10*3/uL (ref 0.0–0.7)
Eosinophils Relative: 2 %
HCT: 40.8 % (ref 36.0–46.0)
HEMOGLOBIN: 14.2 g/dL (ref 12.0–15.0)
Lymphocytes Relative: 6 %
Lymphs Abs: 0.5 10*3/uL — ABNORMAL LOW (ref 0.7–4.0)
MCH: 32.7 pg (ref 26.0–34.0)
MCHC: 34.8 g/dL (ref 30.0–36.0)
MCV: 94 fL (ref 78.0–100.0)
MONOS PCT: 17 %
Monocytes Absolute: 1.5 10*3/uL — ABNORMAL HIGH (ref 0.1–1.0)
NEUTROS PCT: 74 %
Neutro Abs: 6.5 10*3/uL (ref 1.7–7.7)
PLATELETS: 308 10*3/uL (ref 150–400)
RBC: 4.34 MIL/uL (ref 3.87–5.11)
RDW: 13 % (ref 11.5–15.5)
WBC: 8.8 10*3/uL (ref 4.0–10.5)

## 2016-04-15 LAB — BASIC METABOLIC PANEL
ANION GAP: 8 (ref 5–15)
BUN: 21 mg/dL — AB (ref 6–20)
CHLORIDE: 102 mmol/L (ref 101–111)
CO2: 26 mmol/L (ref 22–32)
Calcium: 8.7 mg/dL — ABNORMAL LOW (ref 8.9–10.3)
Creatinine, Ser: 0.75 mg/dL (ref 0.44–1.00)
GFR calc Af Amer: >60 mL/min (ref >60)
Glucose, Bld: 99 mg/dL (ref 65–99)
POTASSIUM: 4.1 mmol/L (ref 3.5–5.1)
SODIUM: 136 mmol/L (ref 135–145)

## 2016-04-15 LAB — I-STAT TROPONIN, ED: Troponin i, poc: 0.05 ng/mL (ref 0.00–0.08)

## 2016-04-15 LAB — BRAIN NATRIURETIC PEPTIDE: B NATRIURETIC PEPTIDE 5: 181.1 pg/mL — AB (ref 0.0–100.0)

## 2016-04-15 MED ORDER — ONDANSETRON HCL 4 MG/2ML IJ SOLN
4.0000 mg | Freq: Once | INTRAMUSCULAR | Status: AC
  Administered 2016-04-15: 4 mg via INTRAVENOUS
  Filled 2016-04-15: qty 2

## 2016-04-15 MED ORDER — PREDNISONE 20 MG PO TABS
20.0000 mg | ORAL_TABLET | Freq: Every day | ORAL | 0 refills | Status: DC
Start: 2016-04-15 — End: 2016-05-20

## 2016-04-15 MED ORDER — AZITHROMYCIN 250 MG PO TABS: 250.0000 mg | ORAL_TABLET | Freq: Every day | ORAL | 0 refills | Status: DC

## 2016-04-15 MED ORDER — FAMOTIDINE IN NACL 20-0.9 MG/50ML-% IV SOLN
20.0000 mg | Freq: Once | INTRAVENOUS | Status: AC
  Administered 2016-04-15: 20 mg via INTRAVENOUS
  Filled 2016-04-15: qty 50

## 2016-04-15 MED ORDER — OMEPRAZOLE 20 MG PO CPDR: 20.0000 mg | DELAYED_RELEASE_CAPSULE | Freq: Every day | ORAL | 0 refills | Status: DC

## 2016-04-15 MED ORDER — METHYLPREDNISOLONE SODIUM SUCC 125 MG IJ SOLR
125.0000 mg | Freq: Once | INTRAMUSCULAR | Status: AC
  Administered 2016-04-15: 125 mg via INTRAVENOUS
  Filled 2016-04-15: qty 2

## 2016-04-15 NOTE — ED Notes (Signed)
BMP recollect sent to lab.  

## 2016-04-15 NOTE — ED Provider Notes (Signed)
[place] DEPT Provider Note   CSN: [id number] Arrival date & time: 04/15/16  1228     History   Chief Complaint Chief Complaint  Patient presents with  . Shortness of Breath  . Abdominal Pain    HPI Caroline Myers is a 82 y.o. female.  She presents for evaluation of a cough. She was seen by her primary care physician on Thursday. She was told that she may have some fluid on her lungs. She was given "a shot of a steroid". Paperwork from her physician states that she was given Depo-Medrol. She felt better for 2 days with her breathing. Should wear a cough over the weekend and now her ribs are sore when she coughs. Reported as tachypneic in route. Upon arrival she is 94%. My exam arrest where rate is 17-18. She is not dyspneic with conversation. Her physician performed an ambulatory O2 test and she desaturated and home oxygen has been ordered for her to wear at night and with ambulation activity.  History of COPD and has nebulizer at home. History of permanent A. fib, on [place] requests. She has no chest pain palpitations shortness of breath or other symptoms currently.  HPI  Past Medical History:  Diagnosis Date  . Allergy    allergic rhinitis  . Anxiety   . Aortic stenosis    EF normal.  Mild aortic stenosis. Mild to moderate aortic regurgitation  . Arthritis   . Asthma 2000   mild. tried Advair after bronchospasm after exposure to cats  . Cancer [facility])    history of skin cancer of the nose  . GERD (gastroesophageal reflux disease)    occ  . Intracranial hemorrhage following injury ([place]) 01/03/2015  . Paroxysmal atrial fibrillation (HCC)    pt does not want anticoagulation  . Shortness of breath    occ  . Shoulder pain, left 05/2010   with rotator cuff tear--Dr Alfonso Ramus  . Stroke [facility])     Patient Active Problem List   Diagnosis Date Noted  . Pulmonary hypertension 03/15/2016  . Anxiety about health   . Cough   . Hypokalemia   . AKI (acute kidney injury) ([place])    . Acute diastolic congestive heart failure ([place]) 07/20/2015  . Debility 07/20/2015  . Sepsis ([place]) 07/20/2015  . Acute on chronic diastolic congestive heart failure ([place])   . History of CVA (cerebrovascular accident)   . Tachypnea   . Leukocytosis 07/17/2015  . Arterial hypotension   . Hypotension 07/16/2015  . Elevated troponin 07/16/2015  . PNA (pneumonia)   . Severe sepsis ([place]) 07/15/2015  . Acute respiratory failure with hypercapnia ([place]) 07/15/2015  . Chronic anticoagulation 04/18/2015  . Orthostatic hypotension 03/09/2015  . CVA (cerebral infarction) 02/14/2015  . Stroke ([place]) 02/14/2015  . Chronic atrial fibrillation ([place]) 02/14/2015  . Stroke with cerebral ischemia ([place])   . Cerebral infarction due to embolism of left middle cerebral artery ([place])   . Occipital fracture ([place])   . Benign essential HTN   . Other allergic rhinitis   . Depression   . SAH (subarachnoid hemorrhage) ([place]) 01/02/2015  . SDH (subdural hematoma) ([place]) 01/02/2015  . Aortic atherosclerosis ([place]) 08/05/2014  . Nonspecific abnormal unspecified cardiovascular function study 10/28/2013  . Aortic valve disorder 10/28/2013  . HLD (hyperlipidemia) 07/28/2013  . TIA (transient ischemic attack) 07/27/2013  . History of CHF (congestive heart failure) 07/27/2013  . CVA (cerebral vascular accident) ([place]) 07/27/2013  . Urticaria 11/06/2010  . Bronchitis 10/12/2010  . Preventative  health care   . Atrial fibrillation ([place]) 04/04/2010  . Anxiety state 03/29/2010  . ATRIAL FLUTTER 03/29/2010  . RIB PAIN, LEFT SIDED 10/12/2009  . ALLERGIC RHINITIS 06/28/2009  . SHOULDER PAIN, LEFT 06/28/2009  . SCIATICA, RIGHT 06/28/2009  . HEADACHE 06/28/2009  . DYSPNEA ON EXERTION 06/28/2009  . SKIN CANCER, HX OF 06/28/2009    Past Surgical History:  Procedure Laterality Date  . ACROMIOPLASTY  2000   right shoulder, arthroscopic  . HERNIA REPAIR  2003  . LUMBAR LAMINECTOMY/DECOMPRESSION MICRODISCECTOMY  04/01/2012     Procedure: LUMBAR LAMINECTOMY/DECOMPRESSION MICRODISCECTOMY 1 LEVEL;  Surgeon: Eustace Moore, MD;  Location: [place];  Service: Neurosurgery;  Laterality: Right;  Right Lumbar four-five extraforaminal microdiskectomy     OB History    No data available       Home Medications    Prior to Admission medications   Medication Sig Start Date End Date Taking? Authorizing Provider  apixaban (ELIQUIS) 5 MG TABS tablet Take 1 tablet (5 mg total) by mouth 2 (two) times daily. 07/28/15  Yes Daniel J Angiulli, PA-C  DULERA 200-5 MCG/ACT AERO Inhale 2 puffs into the lungs every morning. 03/14/16  Yes Historical Provider, MD  PROAIR HFA 108 (90 Base) MCG/ACT inhaler Inhale 2 puffs into the lungs every 6 (six) hours as needed for wheezing or shortness of breath.  02/19/16  Yes Historical Provider, MD  sertraline (ZOLOFT) 50 MG tablet Take 1 tablet (50 mg total) by mouth daily. Patient taking differently: Take 50 mg by mouth every morning.  07/28/15  Yes Daniel J Angiulli, PA-C  sodium chloride (OCEAN) 0.65 % SOLN nasal spray Place 1 spray into both nostrils 3 (three) times daily as needed for congestion.   Yes Historical Provider, MD  vitamin B-12 (CYANOCOBALAMIN) 1000 MCG tablet Take 1,000 mcg by mouth every morning.    Yes Historical Provider, MD  azithromycin (ZITHROMAX Z-PAK) 250 MG tablet Take 1 tablet (250 mg total) by mouth daily. 04/15/16   Tanna Furry, MD  omeprazole (PRILOSEC) 20 MG capsule Take 1 capsule (20 mg total) by mouth daily. 04/15/16   Tanna Furry, MD  predniSONE (DELTASONE) 20 MG tablet Take 1 tablet (20 mg total) by mouth daily with breakfast. 04/15/16   Tanna Furry, MD    Family History Family History  Problem Relation Age of Onset  . Stroke Sister   . Heart attack Neg Hx   . Hypertension Neg Hx     Social History Social History  Substance Use Topics  . Smoking status: Never Smoker  . Smokeless tobacco: Never Used     Comment: occ wine  . Alcohol use 0.6 oz/week    1  Shots of liquor per week     Comment: OCC cocktails     Allergies   Tape   Review of Systems Review of Systems  Constitutional: Negative for appetite change, chills, diaphoresis, fatigue and fever.  HENT: Negative for mouth sores, sore throat and trouble swallowing.   Eyes: Negative for visual disturbance.  Respiratory: Positive for cough and shortness of breath. Negative for chest tightness and wheezing.   Cardiovascular: Positive for chest pain.  Gastrointestinal: Negative for abdominal distention, abdominal pain, diarrhea, nausea and vomiting.  Endocrine: Negative for polydipsia, polyphagia and polyuria.  Genitourinary: Negative for dysuria, frequency and hematuria.  Musculoskeletal: Negative for gait problem.  Skin: Negative for color change, pallor and rash.  Neurological: Negative for dizziness, syncope, light-headedness and headaches.  Hematological: Does not bruise/bleed easily.  Psychiatric/Behavioral: Negative for behavioral problems and confusion.     Physical Exam Updated Vital Signs BP 100/66   Pulse 86   Temp 97.7 F (36.5 C) (Oral)   Resp 23   Ht 5\' 5"  (1.651 m)   Wt 137 lb (62.1 kg)   SpO2 98%   BMI 22.80 kg/m   Physical Exam  Constitutional: She is oriented to person, place, and time. She appears well-developed and well-nourished. No distress.  HENT:  Head: Normocephalic.  Eyes: Conjunctivae are normal. Pupils are equal, round, and reactive to light. No scleral icterus.  Neck: Normal range of motion. Neck supple. No thyromegaly present.  Cardiovascular: Normal rate and regular rhythm.  Exam reveals no gallop and no friction rub.   No murmur heard. Pulmonary/Chest: Effort normal and breath sounds normal. No respiratory distress. She has no wheezes. She has no rales.  Globally diminished breath sounds. No wheezing or prolongation. No crackles or rales. No dependent edema. No clinical signs of CHF or pneumonia. Not hypoxemic. Afebrile.  Abdominal: Soft.  Bowel sounds are normal. She exhibits no distension. There is no tenderness. There is no rebound.  Musculoskeletal: Normal range of motion.  Neurological: She is alert and oriented to person, place, and time.  Skin: Skin is warm and dry. No rash noted.  Psychiatric: She has a normal mood and affect. Her behavior is normal.     ED Treatments / Results  Labs (all labs ordered are listed, but only abnormal results are displayed) Labs Reviewed  CBC WITH DIFFERENTIAL/PLATELET - Abnormal; Notable for the following:       Result Value   Lymphs Abs 0.5 (*)    Monocytes Absolute 1.5 (*)    All other components within normal limits  BRAIN NATRIURETIC PEPTIDE - Abnormal; Notable for the following:    B Natriuretic Peptide 181.1 (*)    All other components within normal limits  BASIC METABOLIC PANEL - Abnormal; Notable for the following:    BUN 21 (*)    Calcium 8.7 (*)    All other components within normal limits  I-STAT TROPOININ, ED    EKG  EKG Interpretation None       Radiology Dg Chest Port 1 View  Result Date: 04/15/2016 CLINICAL DATA:  Short of breath EXAM: PORTABLE CHEST 1 VIEW COMPARISON:  04/11/2016 FINDINGS: Cardiac enlargement. Negative for heart failure or edema. Minimal right pleural effusion. Negative for pneumonia. Underlying COPD and hyperinflation IMPRESSION: Cardiac enlargement without heart failure or edema. Minimal right pleural effusion. Electronically Signed   By: Franchot Gallo M.D.   On: 04/15/2016 13:18    Procedures Procedures (including critical care time)  Medications Ordered in ED Medications  ondansetron (ZOFRAN) injection 4 mg (4 mg Intravenous Given 04/15/16 1347)  methylPREDNISolone sodium succinate (SOLU-MEDROL) 125 mg/2 mL injection 125 mg (125 mg Intravenous Given 04/15/16 1347)  famotidine (PEPCID) IVPB 20 mg premix (0 mg Intravenous Stopped 04/15/16 1502)     Initial Impression / Assessment and Plan / ED Course  I have reviewed the  triage vital signs and the nursing notes.  Pertinent labs & imaging results that were available during my care of the patient were reviewed by me and considered in my medical decision making (see chart for details).  Clinical Course    Our social worker has followed up on her home O2 is being arranged through her primary care. She is not hypoxemic at rest here. She has endotracheal and from the bathroom without symptoms. Plan is prednisone  5 days, Zithromax 5 days. Follow-up with her primary care physician. She states that she had felt "queasy" after she got the steroids on Thursday. We'll keep her on Prilosec until completion of her anabolic steroids.  Final Clinical Impressions(s) / ED Diagnoses   Final diagnoses:  Chest wall pain  COPD exacerbation (HCC)    New Prescriptions New Prescriptions   AZITHROMYCIN (ZITHROMAX Z-PAK) 250 MG TABLET    Take 1 tablet (250 mg total) by mouth daily.   OMEPRAZOLE (PRILOSEC) 20 MG CAPSULE    Take 1 capsule (20 mg total) by mouth daily.   PREDNISONE (DELTASONE) 20 MG TABLET    Take 1 tablet (20 mg total) by mouth daily with breakfast.     Tanna Furry, MD 04/15/16 1557

## 2016-04-15 NOTE — Progress Notes (Signed)
ED CM spoke with pt, her spouse and daughter  Daughter showed Cm a d/c sheet for Dr Harrington Challenger office dated 04/11/16 stating to start pt on home O2 therapy for sat drop to 79%- referral to home health  Husband confirmed choice of home health agency is [facility] and confirms they have not seen, received, or heard from [facility] for oxygen CM answered questions about process for getting oxygen at MD office and as inpatient per medicare guidelines vs ED and differences in oxygen tanks, portable packs and concentrators Noted during assessment pt O2 sat drop to 86% at rest (no activity, no exertion, lying on stretcher) Cm changed O2 oximetry site to a warmer finger and pt went up to 90% and later returned to 87% at rest ED RN updated  Husband discussed noting pt with apnea episodes at home since MD OV Discussed this with ED RN  1512 spoke with Brenton Grills of [facility] about home oxygen He is to check to see if Dr Harrington Challenger staff had called in oxygen to [facility] for pt and to call Dr Harrington Challenger office for the order and items needed  1515 Spoke with Dr Harrington Challenger staff on the Dr line about need of order from Dr Harrington Challenger office for the oxygen ordered for the pt on 04/11/16 To fax it to [facility] ED fax [phone]  [phone] Updated Dr Jeneen Rinks 1530 called from [place] of Dr Harrington Challenger office Explained to her that [facility] needs an order and correct O2 testing statement- Sharee Pimple stated [facility] stated pt did not qualify Cm inquired how the order and qualifying statements were written Sharee Pimple unable to tell Cm how written Explained to [facility] pt can not qualify from [facility] ED for oxygen and encouraged office work with [facility]  [place] informed Cm [facility] did receive oxygen order from Dr Harrington Challenger office but need order written per medicare guide line faxed copy of O2 testing statement sent from [place] of [facility] to [place] of Dr Harrington Challenger office with fax confirmation received at [facility] with Sharee Pimple to confirm fax was received by her  1550 CM updated  pt and family the need of Dr Harrington Challenger office to send correct note to [place] care Provided a copy of the appropriate note to daughter stating O2 Testing  Pt name, dob, date, room ar resting oximetry, if >89% check room air pulse ox during exertion and then check on _ lPM O2 via [place] and the name of the tester Pt has a follow up appt on Thursday, 04/18/16 at Dr Harrington Challenger per daughter who stated April was the RN who assisted pt on 04/11/16  Encouraged to check with Dr Harrington Challenger office if O2 not processed by end of 04/16/16 and to see if pt can return to Dr Harrington Challenger office

## 2016-04-15 NOTE — Progress Notes (Signed)
Called by Dr Jeneen Rinks with need to assist pt from [facility] office with home oxygen Discussed medicare guideline preference pt be observed in hospital for need of oxygen vs getting initiated oxygen from ED

## 2016-04-15 NOTE — ED Notes (Signed)
Bed: [id number] Expected date:  Expected time:  Means of arrival:  Comments: EMS 82 yo F resp distress

## 2016-04-15 NOTE — ED Triage Notes (Signed)
Patient is from home.  Patient was at [place] office 4 days ago and was told she had fluid on her lungs.  Patient is having pain at ribs and chest wall area from breathing.  She is tachypneic.    BP: 104/64 HR:74 R:32 T:99.5 94% on room air

## 2016-04-18 DIAGNOSIS — J449 Chronic obstructive pulmonary disease, unspecified: Secondary | ICD-10-CM | POA: Diagnosis not present

## 2016-04-26 DIAGNOSIS — R0600 Dyspnea, unspecified: Secondary | ICD-10-CM | POA: Diagnosis not present

## 2016-05-09 ENCOUNTER — Ambulatory Visit (INDEPENDENT_AMBULATORY_CARE_PROVIDER_SITE_OTHER): Payer: Medicare Other | Admitting: Podiatry

## 2016-05-09 ENCOUNTER — Encounter: Payer: Self-pay | Admitting: Podiatry

## 2016-05-09 VITALS — Ht 64.5 in | Wt 138.0 lb

## 2016-05-09 DIAGNOSIS — M2041 Other hammer toe(s) (acquired), right foot: Secondary | ICD-10-CM

## 2016-05-09 DIAGNOSIS — L84 Corns and callosities: Secondary | ICD-10-CM | POA: Diagnosis not present

## 2016-05-09 NOTE — Progress Notes (Signed)
Patient ID: Caroline Myers, female   DOB: 1923/11/08, 82 y.o.   MRN: [id number] Complaint:  Visit Type: Patient returns to my office for continued preventative foot care services. Complaint: Patient states" she has developed painful corns on the fifth toe of her right foot. She states that the toe is painful walking and wearing her shoes. She presents the office today for an evaluation and treatment of these painful  Podiatric Exam: Vascular: dorsalis pedis and posterior tibial pulses are palpable bilateral. Capillary return is immediate. Temperature gradient is WNL. Skin turgor WNL  Sensorium: Normal Semmes Weinstein monofilament test. Normal tactile sensation bilaterally. Nail Exam: Pt has thick disfigured discolored nails with subungual debris noted bilateral entire nail hallux  Ulcer Exam: There is no evidence of ulcer or pre-ulcerative changes or infection. Orthopedic Exam: Muscle tone and strength are WNL. No limitations in general ROM. No crepitus or effusions noted. Foot type and digits show no abnormalities. Bony prominences are unremarkable. Skin: No Porokeratosis. No infection or ulcers.  [place] durum fifth toe right foot.  Lister corn fifth toe right foot.  Diagnosis: Lister corn fifth toe right foot  Hammer toe fifth toes B/L  Treatment & Plan Procedures and Treatment: Consent by patient was obtained for treatment procedures. The patient understood the discussion of treatment and procedures well. All questions were answered thoroughly reviewed.  Debride corns. B/L B/L  Return Visit-Office Procedure: Patient instructed to return to the office for a follow up visit 3 months for continued evaluation and treatment.    Gardiner Barefoot DPM

## 2016-05-13 DIAGNOSIS — D225 Melanocytic nevi of trunk: Secondary | ICD-10-CM | POA: Diagnosis not present

## 2016-05-13 DIAGNOSIS — L821 Other seborrheic keratosis: Secondary | ICD-10-CM | POA: Diagnosis not present

## 2016-05-13 DIAGNOSIS — Z85828 Personal history of other malignant neoplasm of skin: Secondary | ICD-10-CM | POA: Diagnosis not present

## 2016-05-13 DIAGNOSIS — L570 Actinic keratosis: Secondary | ICD-10-CM | POA: Diagnosis not present

## 2016-05-20 ENCOUNTER — Encounter: Payer: Self-pay | Admitting: Nurse Practitioner

## 2016-05-20 ENCOUNTER — Ambulatory Visit (INDEPENDENT_AMBULATORY_CARE_PROVIDER_SITE_OTHER): Payer: Medicare Other | Admitting: Nurse Practitioner

## 2016-05-20 VITALS — BP 117/75 | HR 74 | Ht 64.5 in | Wt 137.8 lb

## 2016-05-20 DIAGNOSIS — E785 Hyperlipidemia, unspecified: Secondary | ICD-10-CM

## 2016-05-20 DIAGNOSIS — I10 Essential (primary) hypertension: Secondary | ICD-10-CM

## 2016-05-20 DIAGNOSIS — I480 Paroxysmal atrial fibrillation: Secondary | ICD-10-CM

## 2016-05-20 DIAGNOSIS — I63412 Cerebral infarction due to embolism of left middle cerebral artery: Secondary | ICD-10-CM | POA: Diagnosis not present

## 2016-05-20 DIAGNOSIS — G459 Transient cerebral ischemic attack, unspecified: Secondary | ICD-10-CM

## 2016-05-20 DIAGNOSIS — W19XXXA Unspecified fall, initial encounter: Secondary | ICD-10-CM | POA: Insufficient documentation

## 2016-05-20 DIAGNOSIS — W19XXXD Unspecified fall, subsequent encounter: Secondary | ICD-10-CM

## 2016-05-20 NOTE — Progress Notes (Signed)
I agree with the above plan 

## 2016-05-20 NOTE — Progress Notes (Signed)
[facility]  PATIENT: Caroline Myers DOB: Nov 05, 1923   REASON FOR VISIT: Follow-up for stroke HISTORY FROM:    HISTORY OF PRESENT ILLNESS:  HISTORY: PSKathryn KEYSHAWN Myers is an 82 y.o. female history of atrial fibrillation not on anticoagulation who experienced an episode of difficulty with reading comprehension for about one hour starting at 2 PM on 07/26/2013. At around 6 PM she was noted by husband to be confused and was using cooking and eating utensils inappropriately. The time she arrived in the emergency room deficits have resolved. She has no previous history of stroke nor TIA. She takes aspirin 81 mg per day. She reportedly has refused anticoagulation with Coumadin. CT scan of her head showed no acute intracranial abnormality. NIH stroke score was 0. Patient was not administerd TPA secondary to rapidly resolving deficits . She was admitted for further evaluation and treatment. MRI of the brain showed small acute right cerebellar infarct. No mass effect or hemorrhage.  Several small acute to subacute lacunar infarcts in the left frontal lobe white matter, favor synchronous small vessel disease rather than embolic disease.  Underlying chronic small vessel disease, including a small chronic left cerebellar lacunar infarct.  MRA of the brain showed patent distal vertebral and basilar arteries, the left vertebral terminates in PICA. Moderate to severe bilateral PCA stenosis with preserved distal flow. Mildly dolichoectatic anterior circulation. 2D Echocardiogram showed an EF 55-60% with no source of embolus.  Upon discharge she was started on Eliquis daily, and she has tolerated this well.  They are unhappy about the cost.  She had no lasting deficits and has no repeat symptoms. Update 01/04/2014 : She returns for followup after last visit 3 months ago. She continues to do well without recurrent stroke or TIA symptoms. She is tolerating eliquis well without significant  bleeding, bruising or other side effects. She convinced to have mild gait and balance difficulties but these are unchanged. She's not had any major falls. She has had no new health problems. Update 07/20/2014 ; she returns for follow-up after last visit 6 months ago. She has not had any recurrent stroke or TIA symptoms. She however has been under significant stress due to her husband's sickness. She continues to have mild balance and gait difficulties and has had one fall since June last year. She also complains of occasional posterior headaches which are mild pressure-like in quality and 9 and 5/10 in severity. She does not need to take medications for these. She remains on eliquis which is tolerating well without significant bleeding and only minor bruising. She is also on Zoloft for depression but feels her depression is stable. She in the past used to meditate and 2 yolk but of late she has not managed to find time to do so. She does complain of mild short-term memory difficulties but these do not appear to be progressive or interfere with her activities of daily living.  Update 02/13/2016 PS; Patient returns for follow-up after last visit with me in January 2016. She unfortunately fell and fractured her clavicle a week ago. She's had 3 falls in the last 1 month. She states she has no warning before the fall and usually falls backwards. Last fall occurred when she was reaching up and  putting things in a top drawer She has had history of cerebral infarct and had some balance difficulties. She is admitted last year to the hospital in August 2016 with a left frontal MCA branch infarct from her atrial fibrillation. She  saw Dr. Erlinda Hong and that time. She was continued on eliquis which is still taking and tolerating it well but does have minor bruising the. She's had no major bleeding episodes. Patient also complains of ringing in the ears. She has an upcoming appointment with the ENT physician to discuss that. She is  also seeing ophthalmologist for redness in her eyes. UPDATE 11/20/2017[name], 82 year old female returns for follow-up she has a history of cerebral infarct and some balance difficulties. She had left frontal MCA branch infarct from her atrial fibrillation August 2016. She is currently on eliquis with minimal bruising and bleeding. Blood pressure well controlled in the office today at 117/75. She has had several falls after her last visit and MRI of the brain which shows moderate cortical atrophy,Multiple T2/FLAIR hyperintense foci in the pons and hemispheres consistent with chronic microvascular ischemic change.  The extent is similar to that noted on the eighth 16 2016 MRI. There were no acute findings. MRI of cervical spine without contrast This MRI of the cervical spine shows severe multilevel degenerative changes as detailed above. The most significant findings are: 1.    At C2-C3 there is severe spinal stenosis associated with mild anterolisthesis.   There is no nerve root compression.  2.    At C3-C4 there is mild to moderate spinal stenosis but no definite nerve root compression. 3.    At C4-C5, there is moderate spinal stenosis severe bilateral foraminal narrowing at could lead to compression of either of the C5 nerve root. 4.    At C5-C6, there is severe spinal stenosis and severe bilateral foraminal narrowing at could lead to compression of either of the C6 nerve roots. 5.    At C6-C7 there is moderate spinal stenosis but there does not appear to be any definite nerve root compression. 6.    At C7-T1, there are degenerative changes but no spinal stenosis or nerve root compression. 7.    At T1-T2, there is severe right foraminal narrowing that could lead to compression of the right T1 nerve root. 8.    At T2-T3, there is severe right > left foraminal narrowing that could lead to compression of either T2 nerve root.   Patient is wanting to get some physical therapy. Her last therapy was  for right shoulder at [facility] she returns for reevaluation    REVIEW OF SYSTEMS: Full 14 system review of systems performed and notable only for those listed, all others are neg:  Constitutional: Fatigue  Cardiovascular: neg Ear/Nose/Throat: neg  Skin: neg Eyes: neg Respiratory: Shortness of breath Gastroitestinal: neg  Hematology/Lymphatic: Easy bruising  Endocrine: neg Musculoskeletal:neg Allergy/Immunology: neg Neurological: neg Psychiatric: Anxiety Sleep : neg   ALLERGIES: Allergies  Allergen Reactions  . Tape Other (See Comments)    Rips skin    HOME MEDICATIONS: Outpatient Medications Prior to Visit  Medication Sig Dispense Refill  . apixaban (ELIQUIS) 5 MG TABS tablet Take 1 tablet (5 mg total) by mouth 2 (two) times daily. 180 tablet 3  . DULERA 200-5 MCG/ACT AERO Inhale 2 puffs into the lungs every morning.    Marland Kitchen PROAIR HFA 108 (90 Base) MCG/ACT inhaler Inhale 2 puffs into the lungs every 6 (six) hours as needed for wheezing or shortness of breath.     . sertraline (ZOLOFT) 50 MG tablet Take 1 tablet (50 mg total) by mouth daily. (Patient taking differently: Take 50 mg by mouth every morning. ) 30 tablet 4  . sodium chloride (OCEAN) 0.65 %  SOLN nasal spray Place 1 spray into both nostrils 3 (three) times daily as needed for congestion.    . vitamin B-12 (CYANOCOBALAMIN) 1000 MCG tablet Take 1,000 mcg by mouth every morning.     Marland Kitchen azithromycin (ZITHROMAX Z-PAK) 250 MG tablet Take 1 tablet (250 mg total) by mouth daily. (Patient not taking: Reported on 05/20/2016) 6 tablet 0  . omeprazole (PRILOSEC) 20 MG capsule Take 1 capsule (20 mg total) by mouth daily. 10 capsule 0  . predniSONE (DELTASONE) 20 MG tablet Take 1 tablet (20 mg total) by mouth daily with breakfast. 7 tablet 0   No facility-administered medications prior to visit.     PAST MEDICAL HISTORY: Past Medical History:  Diagnosis Date  . Allergy    allergic rhinitis  . Anxiety   .  Aortic stenosis    EF normal.  Mild aortic stenosis. Mild to moderate aortic regurgitation  . Arthritis   . Asthma 2000   mild. tried Advair after bronchospasm after exposure to cats  . Cancer [facility])    history of skin cancer of the nose  . GERD (gastroesophageal reflux disease)    occ  . Intracranial hemorrhage following injury ([place]) 01/03/2015  . Paroxysmal atrial fibrillation ([facility])    pt does not want anticoagulation  . Shortness of breath    occ  . Shoulder pain, left 05/2010   with rotator cuff tear--Dr Alfonso Ramus  . Stroke [facility])     PAST SURGICAL HISTORY: Past Surgical History:  Procedure Laterality Date  . ACROMIOPLASTY  2000   right shoulder, arthroscopic  . HERNIA REPAIR  2003  . LUMBAR LAMINECTOMY/DECOMPRESSION MICRODISCECTOMY  04/01/2012   Procedure: LUMBAR LAMINECTOMY/DECOMPRESSION MICRODISCECTOMY 1 LEVEL;  Surgeon: Eustace Moore, MD;  Location: [facility];  Service: Neurosurgery;  Laterality: Right;  Right Lumbar four-five extraforaminal microdiskectomy     FAMILY HISTORY: Family History  Problem Relation Age of Onset  . Stroke Sister   . Heart attack Neg Hx   . Hypertension Neg Hx     SOCIAL HISTORY: Social History   Social History  . Marital status: Married    Spouse name: Herbie Baltimore  . Number of children: 2  . Years of education: 12th   Occupational History  . RETIRED Retired   Social History Main Topics  . Smoking status: Never Smoker  . Smokeless tobacco: Never Used     Comment: occ wine  . Alcohol use 0.6 oz/week    1 Shots of liquor per week     Comment: OCC cocktails  . Drug use: No  . Sexual activity: Not on file   Other Topics Concern  . Not on file   Social History Narrative   Patient is married with 2 children.   Patient is right handed.   Patient has hs education.   Patient drinks 1 cup daily.     PHYSICAL EXAM  Vitals:   05/20/16 1111  BP: 117/75  Pulse: 74  Weight: 137 lb 12.8 oz (62.5 kg)  Height: 5' 4.5" (1.638 m)    Body mass index is 23.29 kg/m. Generalized: Frail elderly Caucasian lady, in no acute distress  Head: normocephalic and atraumatic. Oropharynx benign  Neck: Supple, no carotid bruits  Cardiac: Irrgular rate and rhythm, systolic murmur 2/6 Musculoskeletal: mild kyphoscoliotic deformity of back  Neurological examination  Mentation: Alert oriented to time, place, history taking. Follows all commands speech and language fluent Cranial nerve II-XII: Fundoscopic exam not done. Pupils were equal round reactive to light  extraocular movements were full, visual field were full on confrontational test. Facial sensation and strength were normal. hearing was intact to finger rubbing bilaterally. Uvula tongue midline. head turning and shoulder shrug and were normal and symmetric.Tongue protrusion into cheek strength was normal. Motor: The motor testing reveals 5 over 5 strength of all 4 extremities. Good symmetric motor tone is noted throughout. Fine finger movements are diminished on the right. Orbits left over right upper extremity. Mild weakness of right grip. Sensory: Sensory testing is intact to pinprick, soft touch on all 4 extremities. No evidence of extinction is noted.  Coordination: Cerebellar testing reveals good finger-nose-finger and heel-to-shin bilaterally.  Gait and station: Gait is normal. Tandem gait is performed with mild difficulty . Romberg is negative.    Reflexes: Deep tendon reflexes are symmetric and normal bilaterally. Toes are downgoing bilaterally.  DIAGNOSTIC DATA (LABS, IMAGING, TESTING) - I reviewed patient records, labs, notes, testing and imaging myself where available.  Lab Results  Component Value Date   WBC 8.8 04/15/2016   HGB 14.2 04/15/2016   HCT 40.8 04/15/2016   MCV 94.0 04/15/2016   PLT 308 04/15/2016      Component Value Date/Time   NA 136 04/15/2016 1407   K 4.1 04/15/2016 1407   CL 102 04/15/2016 1407   CO2 26 04/15/2016 1407   GLUCOSE 99 04/15/2016  1407   BUN 21 (H) 04/15/2016 1407   CREATININE 0.75 04/15/2016 1407   CREATININE 0.81 11/02/2010 1337   CALCIUM 8.7 (L) 04/15/2016 1407   PROT 5.5 (L) 08/02/2015 0637   ALBUMIN 2.7 (L) 08/02/2015 0637   AST 21 08/02/2015 0637   ALT 23 08/02/2015 0637   ALKPHOS 91 08/02/2015 0637   BILITOT 0.8 08/02/2015 0637   GFRNONAA >60 04/15/2016 1407   GFRNONAA >60 11/02/2010 1337   GFRAA >60 04/15/2016 1407   GFRAA >60 11/02/2010 1337   Lab Results  Component Value Date   CHOL 199 02/15/2015   HDL 42 02/15/2015   LDLCALC 137 (H) 02/15/2015   TRIG 101 02/15/2015   CHOLHDL 4.7 02/15/2015   Lab Results  Component Value Date   HGBA1C 6.0 (H) 02/14/2015   Lab Results  Component Value Date   [id number] 01/03/2015   Lab Results  Component Value Date   TSH 1.682 01/02/2015      ASSESSMENT AND PLAN 82 y.o. 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 Left frontal MCA branch infarct from her atrial fibrillation August 2016. She has done well with only mild gait and balance residual deficits.  PLAN: Will set up for physical therapy fall risk  Continue Eliquis for secondary stroke prevention Strict control of hypertension today's reading 117/75 Low carb low-fat diet Follow-up in 6 months Dennie Bible, [facility], [facility], APRN  [facility] [address] [place], [place] [address] [phone]

## 2016-05-20 NOTE — Patient Instructions (Addendum)
Will set up for physical therapy fall risk  Continue Eliquis for secondary stroke prevention Strict control of hypertension today's reading 117/75 Low carb low-fat diet Follow-up in 6 months

## 2016-05-27 ENCOUNTER — Ambulatory Visit (INDEPENDENT_AMBULATORY_CARE_PROVIDER_SITE_OTHER): Payer: Medicare Other | Admitting: Podiatry

## 2016-05-27 DIAGNOSIS — H01005 Unspecified blepharitis left lower eyelid: Secondary | ICD-10-CM | POA: Diagnosis not present

## 2016-05-27 DIAGNOSIS — L84 Corns and callosities: Secondary | ICD-10-CM | POA: Diagnosis not present

## 2016-05-27 DIAGNOSIS — H02105 Unspecified ectropion of left lower eyelid: Secondary | ICD-10-CM | POA: Diagnosis not present

## 2016-05-27 DIAGNOSIS — M79675 Pain in left toe(s): Secondary | ICD-10-CM

## 2016-05-27 DIAGNOSIS — B351 Tinea unguium: Secondary | ICD-10-CM

## 2016-05-27 DIAGNOSIS — M79674 Pain in right toe(s): Secondary | ICD-10-CM

## 2016-05-28 NOTE — Progress Notes (Signed)
Subjective: 82 year old female presents the office today with her daughter for concerns of a corn between her right fourth and fifth toe of the also asked that if her fifth toe which is very painful with pressure in shoe gear. She denies any redness or warmth to the toes. She also is asking her nails be trimmed as they are thickened elongated she cannot trim the nails herself. Denies any systemic complaints such as fevers, chills, nausea, vomiting. No acute changes since last appointment, and no other complaints at this time.   Objective: AAO x3, NAD DP/PT pulses palpable bilaterally, CRT less than 3 seconds Hyperkeratotic lesion present on the corresponding aspect of the right fourth and fifth toes in the interspace. Also hyperkeratotic lesion to the dorsal lateral aspect of the fifth PIPJ. Upon debridement there is no underlying ulceration, drainage or any signs of infection. Nails are hypertrophic, dystrophic, brittle, discolored, elongated 10. There is no swelling redness or drainage. Tenderness nails 1-5 bilaterally. No open lesions or pre-ulcerative lesions.  No pain with calf compression, swelling, warmth, erythema  Assessment: Hyperkeratotic lesions 3, symptomatic onychomycosis  Plan: -All treatment options discussed with the patient including all alternatives, risks, complications.  -Nails debrided 10 without complications or bleeding -Hyperkeratotic lesions debrided 3 without complications or bleeding -Daily foot inspection -Follow-up in 3 months or sooner if needed. -Patient encouraged to call the office with any questions, concerns, change in symptoms.   Celesta Gentile, DPM

## 2016-05-29 ENCOUNTER — Ambulatory Visit: Payer: Medicare Other | Admitting: Podiatry

## 2016-06-06 ENCOUNTER — Ambulatory Visit: Payer: Medicare Other | Admitting: Podiatry

## 2016-06-07 ENCOUNTER — Telehealth: Payer: Self-pay | Admitting: *Deleted

## 2016-06-07 NOTE — Telephone Encounter (Signed)
Per Daun Peacock, NP spoke with Carole Civil, [facility] to clarify referral message she had sent to [facility].  Per Daun Peacock, advised [name] patient is to come into [facility] for PT. She verbalized understanding, stated she would call patient and schedule her.

## 2016-06-10 DIAGNOSIS — Z111 Encounter for screening for respiratory tuberculosis: Secondary | ICD-10-CM | POA: Diagnosis not present

## 2016-06-12 DIAGNOSIS — J449 Chronic obstructive pulmonary disease, unspecified: Secondary | ICD-10-CM | POA: Diagnosis not present

## 2016-06-12 DIAGNOSIS — K219 Gastro-esophageal reflux disease without esophagitis: Secondary | ICD-10-CM | POA: Diagnosis not present

## 2016-06-12 DIAGNOSIS — M419 Scoliosis, unspecified: Secondary | ICD-10-CM | POA: Diagnosis not present

## 2016-06-12 DIAGNOSIS — I480 Paroxysmal atrial fibrillation: Secondary | ICD-10-CM | POA: Diagnosis not present

## 2016-06-12 DIAGNOSIS — F419 Anxiety disorder, unspecified: Secondary | ICD-10-CM | POA: Diagnosis not present

## 2016-06-12 DIAGNOSIS — E7800 Pure hypercholesterolemia, unspecified: Secondary | ICD-10-CM | POA: Diagnosis not present

## 2016-06-21 DIAGNOSIS — S01312A Laceration without foreign body of left ear, initial encounter: Secondary | ICD-10-CM | POA: Diagnosis not present

## 2016-06-21 DIAGNOSIS — W06XXXA Fall from bed, initial encounter: Secondary | ICD-10-CM | POA: Diagnosis not present

## 2016-06-27 DIAGNOSIS — J449 Chronic obstructive pulmonary disease, unspecified: Secondary | ICD-10-CM | POA: Diagnosis not present

## 2016-06-27 DIAGNOSIS — F4320 Adjustment disorder, unspecified: Secondary | ICD-10-CM | POA: Diagnosis not present

## 2016-06-28 ENCOUNTER — Observation Stay (HOSPITAL_COMMUNITY): Payer: Medicare Other

## 2016-06-28 ENCOUNTER — Emergency Department (HOSPITAL_COMMUNITY): Payer: Medicare Other

## 2016-06-28 ENCOUNTER — Inpatient Hospital Stay (HOSPITAL_COMMUNITY)
Admission: EM | Admit: 2016-06-28 | Discharge: 2016-06-30 | DRG: 291 | Disposition: A | Discharge status: Home Health | Payer: Medicare Other | Attending: Internal Medicine | Admitting: Internal Medicine

## 2016-06-28 ENCOUNTER — Encounter (HOSPITAL_COMMUNITY): Payer: Self-pay

## 2016-06-28 DIAGNOSIS — G244 Idiopathic orofacial dystonia: Secondary | ICD-10-CM

## 2016-06-28 DIAGNOSIS — I509 Heart failure, unspecified: Secondary | ICD-10-CM

## 2016-06-28 DIAGNOSIS — J449 Chronic obstructive pulmonary disease, unspecified: Secondary | ICD-10-CM | POA: Diagnosis present

## 2016-06-28 DIAGNOSIS — F419 Anxiety disorder, unspecified: Secondary | ICD-10-CM | POA: Diagnosis present

## 2016-06-28 DIAGNOSIS — I248 Other forms of acute ischemic heart disease: Secondary | ICD-10-CM | POA: Diagnosis not present

## 2016-06-28 DIAGNOSIS — Z8673 Personal history of transient ischemic attack (TIA), and cerebral infarction without residual deficits: Secondary | ICD-10-CM

## 2016-06-28 DIAGNOSIS — J069 Acute upper respiratory infection, unspecified: Secondary | ICD-10-CM

## 2016-06-28 DIAGNOSIS — Z888 Allergy status to other drugs, medicaments and biological substances status: Secondary | ICD-10-CM

## 2016-06-28 DIAGNOSIS — I5031 Acute diastolic (congestive) heart failure: Secondary | ICD-10-CM | POA: Diagnosis not present

## 2016-06-28 DIAGNOSIS — I480 Paroxysmal atrial fibrillation: Secondary | ICD-10-CM

## 2016-06-28 DIAGNOSIS — R0602 Shortness of breath: Secondary | ICD-10-CM

## 2016-06-28 DIAGNOSIS — I6782 Cerebral ischemia: Secondary | ICD-10-CM | POA: Diagnosis not present

## 2016-06-28 DIAGNOSIS — Z66 Do not resuscitate: Secondary | ICD-10-CM | POA: Diagnosis present

## 2016-06-28 DIAGNOSIS — I482 Chronic atrial fibrillation: Secondary | ICD-10-CM | POA: Diagnosis present

## 2016-06-28 DIAGNOSIS — I472 Ventricular tachycardia: Secondary | ICD-10-CM | POA: Diagnosis present

## 2016-06-28 DIAGNOSIS — J9601 Acute respiratory failure with hypoxia: Secondary | ICD-10-CM | POA: Diagnosis present

## 2016-06-28 DIAGNOSIS — R0789 Other chest pain: Secondary | ICD-10-CM

## 2016-06-28 DIAGNOSIS — J9691 Respiratory failure, unspecified with hypoxia: Secondary | ICD-10-CM

## 2016-06-28 DIAGNOSIS — Z823 Family history of stroke: Secondary | ICD-10-CM

## 2016-06-28 DIAGNOSIS — Z9889 Other specified postprocedural states: Secondary | ICD-10-CM

## 2016-06-28 DIAGNOSIS — F329 Major depressive disorder, single episode, unspecified: Secondary | ICD-10-CM | POA: Diagnosis present

## 2016-06-28 DIAGNOSIS — K219 Gastro-esophageal reflux disease without esophagitis: Secondary | ICD-10-CM | POA: Diagnosis present

## 2016-06-28 DIAGNOSIS — Z79899 Other long term (current) drug therapy: Secondary | ICD-10-CM

## 2016-06-28 DIAGNOSIS — G249 Dystonia, unspecified: Secondary | ICD-10-CM | POA: Diagnosis present

## 2016-06-28 HISTORY — DX: Pneumonia, unspecified organism: J18.9

## 2016-06-28 HISTORY — DX: Reserved for concepts with insufficient information to code with codable children: IMO0002

## 2016-06-28 HISTORY — DX: Chronic obstructive pulmonary disease, unspecified: J44.9

## 2016-06-28 HISTORY — DX: Scoliosis, unspecified: M41.9

## 2016-06-28 HISTORY — DX: Allergic rhinitis, unspecified: J30.9

## 2016-06-28 HISTORY — DX: Unspecified malignant neoplasm of skin of nose: C44.301

## 2016-06-28 LAB — BASIC METABOLIC PANEL
ANION GAP: 9 (ref 5–15)
BUN: 17 mg/dL (ref 6–20)
CALCIUM: 8.6 mg/dL — AB (ref 8.9–10.3)
CO2: 25 mmol/L (ref 22–32)
Chloride: 103 mmol/L (ref 101–111)
Creatinine, Ser: 0.91 mg/dL (ref 0.44–1.00)
GFR, EST NON AFRICAN AMERICAN: 54 mL/min — AB (ref >60)
GLUCOSE: 96 mg/dL (ref 65–99)
POTASSIUM: 3.6 mmol/L (ref 3.5–5.1)
Sodium: 137 mmol/L (ref 135–145)

## 2016-06-28 LAB — CBC
HEMATOCRIT: 42.2 % (ref 36.0–46.0)
HEMOGLOBIN: 13.5 g/dL (ref 12.0–15.0)
MCH: 32.6 pg (ref 26.0–34.0)
MCHC: 32 g/dL (ref 30.0–36.0)
MCV: 101.9 fL — ABNORMAL HIGH (ref 78.0–100.0)
Platelets: 188 10*3/uL (ref 150–400)
RBC: 4.14 MIL/uL (ref 3.87–5.11)
RDW: 15.2 % (ref 11.5–15.5)
WBC: 7.3 10*3/uL (ref 4.0–10.5)

## 2016-06-28 LAB — I-STAT TROPONIN, ED: TROPONIN I, POC: 0.05 ng/mL (ref 0.00–0.08)

## 2016-06-28 LAB — TROPONIN I
Troponin I: 0.05 ng/mL (ref <0.03)
Troponin I: 0.05 ng/mL (ref <0.03)

## 2016-06-28 LAB — BRAIN NATRIURETIC PEPTIDE: B NATRIURETIC PEPTIDE 5: 230.1 pg/mL — AB (ref 0.0–100.0)

## 2016-06-28 MED ORDER — SERTRALINE HCL 50 MG PO TABS
50.0000 mg | ORAL_TABLET | Freq: Every day | ORAL | Status: DC
  Administered 2016-06-28 – 2016-06-30 (×3): 50 mg via ORAL
  Filled 2016-06-28 (×5): qty 1

## 2016-06-28 MED ORDER — ALBUTEROL SULFATE (2.5 MG/3ML) 0.083% IN NEBU: 2.5000 mg | INHALATION_SOLUTION | RESPIRATORY_TRACT | Status: DC | PRN

## 2016-06-28 MED ORDER — POTASSIUM CHLORIDE CRYS ER 20 MEQ PO TBCR
20.0000 meq | EXTENDED_RELEASE_TABLET | Freq: Two times a day (BID) | ORAL | Status: DC
  Administered 2016-06-28 (×2): 20 meq via ORAL
  Filled 2016-06-28 (×2): qty 1

## 2016-06-28 MED ORDER — ONDANSETRON HCL 4 MG/2ML IJ SOLN
4.0000 mg | Freq: Four times a day (QID) | INTRAMUSCULAR | Status: DC | PRN
  Administered 2016-06-28: 4 mg via INTRAVENOUS
  Filled 2016-06-28: qty 2

## 2016-06-28 MED ORDER — ACETAMINOPHEN 650 MG RE SUPP: 650.0000 mg | Freq: Four times a day (QID) | RECTAL | Status: DC | PRN

## 2016-06-28 MED ORDER — MOMETASONE FURO-FORMOTEROL FUM 200-5 MCG/ACT IN AERO
2.0000 | INHALATION_SPRAY | Freq: Two times a day (BID) | RESPIRATORY_TRACT | Status: DC
  Administered 2016-06-28 – 2016-06-29 (×2): 2 via RESPIRATORY_TRACT
  Filled 2016-06-28: qty 8.8

## 2016-06-28 MED ORDER — SALINE SPRAY 0.65 % NA SOLN
1.0000 | NASAL | Status: DC | PRN
  Filled 2016-06-28: qty 44

## 2016-06-28 MED ORDER — APIXABAN 5 MG PO TABS
5.0000 mg | ORAL_TABLET | Freq: Two times a day (BID) | ORAL | Status: DC
  Administered 2016-06-28 – 2016-06-30 (×4): 5 mg via ORAL
  Filled 2016-06-28 (×4): qty 1

## 2016-06-28 MED ORDER — SODIUM CHLORIDE 0.9 % IV SOLN: 250.0000 mL | INTRAVENOUS | Status: DC | PRN

## 2016-06-28 MED ORDER — GI COCKTAIL ~~LOC~~: 30.0000 mL | Freq: Four times a day (QID) | ORAL | Status: DC | PRN

## 2016-06-28 MED ORDER — ALPRAZOLAM 0.25 MG PO TABS
0.2500 mg | ORAL_TABLET | Freq: Three times a day (TID) | ORAL | Status: DC | PRN
  Administered 2016-06-28 – 2016-06-29 (×2): 0.25 mg via ORAL
  Filled 2016-06-28 (×2): qty 1

## 2016-06-28 MED ORDER — ACETAMINOPHEN 325 MG PO TABS
650.0000 mg | ORAL_TABLET | Freq: Four times a day (QID) | ORAL | Status: DC | PRN
  Administered 2016-06-28 – 2016-06-30 (×2): 650 mg via ORAL
  Filled 2016-06-28 (×2): qty 2

## 2016-06-28 MED ORDER — TIOTROPIUM BROMIDE MONOHYDRATE 18 MCG IN CAPS
18.0000 ug | ORAL_CAPSULE | Freq: Every day | RESPIRATORY_TRACT | Status: DC
  Filled 2016-06-28 (×2): qty 5

## 2016-06-28 MED ORDER — SODIUM CHLORIDE 0.9% FLUSH: 3.0000 mL | INTRAVENOUS | Status: DC | PRN

## 2016-06-28 MED ORDER — FUROSEMIDE 10 MG/ML IJ SOLN
40.0000 mg | Freq: Two times a day (BID) | INTRAMUSCULAR | Status: DC
  Administered 2016-06-28: 40 mg via INTRAVENOUS
  Filled 2016-06-28 (×2): qty 4

## 2016-06-28 MED ORDER — SODIUM CHLORIDE 0.9% FLUSH
3.0000 mL | Freq: Two times a day (BID) | INTRAVENOUS | Status: DC
  Administered 2016-06-28 – 2016-06-29 (×4): 3 mL via INTRAVENOUS

## 2016-06-28 MED ORDER — ONDANSETRON HCL 4 MG PO TABS: 4.0000 mg | ORAL_TABLET | Freq: Four times a day (QID) | ORAL | Status: DC | PRN

## 2016-06-28 MED ORDER — FUROSEMIDE 10 MG/ML IJ SOLN
40.0000 mg | Freq: Once | INTRAMUSCULAR | Status: AC
  Administered 2016-06-28: 40 mg via INTRAVENOUS
  Filled 2016-06-28: qty 4

## 2016-06-28 MED ORDER — APIXABAN 5 MG PO TABS
5.0000 mg | ORAL_TABLET | Freq: Two times a day (BID) | ORAL | Status: DC
  Filled 2016-06-28 (×3): qty 1

## 2016-06-28 MED ORDER — SODIUM CHLORIDE 0.9% FLUSH
3.0000 mL | Freq: Two times a day (BID) | INTRAVENOUS | Status: DC
  Administered 2016-06-28 (×2): 3 mL via INTRAVENOUS

## 2016-06-28 NOTE — ED Triage Notes (Signed)
Here by ems, Woke with SOB,Dry Cough, Hoarse, Initial sat in 80's on NRB 98 and on 3 L 98 %. LS clear with ems, chest xray yesterday new COPD.

## 2016-06-28 NOTE — ED Provider Notes (Signed)
Emergency Department Provider Note   I have reviewed the triage vital signs and the nursing notes.   HISTORY  Chief Complaint Shortness of Breath   HPI Caroline Myers is a 82 y.o. female with PMH of AS, COPD, a-fib, and prior CVA presents to the emergency department for evaluation of sudden onset difficulty breathing, chest tightness, and hypoxemia by EMS. The patient states that she was diagnosed with COPD sometime this summer. She's been using her albuterol inhaler intermittently but has never had a major exacerbation. Patient states that this evening she woke up from sleep and felt very jittery with her palpitations and then noticed she was very short of breath. She had some associated chest tightness. Patient denies that it was painful. She is an albuterol inhaler which did not improve her symptoms significantly. EMS was called a report oxygen saturation of 80% on arrival.    Past Medical History:  Diagnosis Date  . Allergy    allergic rhinitis  . Anxiety   . Aortic stenosis    EF normal.  Mild aortic stenosis. Mild to moderate aortic regurgitation  . Arthritis   . Asthma 2000   mild. tried Advair after bronchospasm after exposure to cats  . Cancer [facility])    history of skin cancer of the nose  . GERD (gastroesophageal reflux disease)    occ  . Intracranial hemorrhage following injury ([place]) 01/03/2015  . Paroxysmal atrial fibrillation (HCC)    pt does not want anticoagulation  . Shortness of breath    occ  . Shoulder pain, left 05/2010   with rotator cuff tear--Dr Alfonso Ramus  . Stroke [facility])     Patient Active Problem List   Diagnosis Date Noted  . Falls 05/20/2016  . Pulmonary hypertension 03/15/2016  . Anxiety about health   . Cough   . Hypokalemia   . AKI (acute kidney injury) ([place])   . Acute diastolic congestive heart failure ([place]) 07/20/2015  . Debility 07/20/2015  . Sepsis ([place]) 07/20/2015  . Acute on chronic diastolic congestive heart failure ([place])   .  History of CVA (cerebrovascular accident)   . Tachypnea   . Leukocytosis 07/17/2015  . Arterial hypotension   . Hypotension 07/16/2015  . Elevated troponin 07/16/2015  . PNA (pneumonia)   . Severe sepsis ([place]) 07/15/2015  . Acute respiratory failure with hypercapnia ([place]) 07/15/2015  . Chronic anticoagulation 04/18/2015  . Orthostatic hypotension 03/09/2015  . CVA (cerebral infarction) 02/14/2015  . Stroke ([place]) 02/14/2015  . Chronic atrial fibrillation ([place]) 02/14/2015  . Stroke with cerebral ischemia ([place])   . Cerebral infarction due to embolism of left middle cerebral artery ([place])   . Occipital fracture ([place])   . Benign essential HTN   . Other allergic rhinitis   . Depression   . SAH (subarachnoid hemorrhage) ([place]) 01/02/2015  . SDH (subdural hematoma) ([place]) 01/02/2015  . Aortic atherosclerosis ([place]) 08/05/2014  . Nonspecific abnormal unspecified cardiovascular function study 10/28/2013  . Aortic valve disorder 10/28/2013  . HLD (hyperlipidemia) 07/28/2013  . TIA (transient ischemic attack) 07/27/2013  . History of CHF (congestive heart failure) 07/27/2013  . CVA (cerebral vascular accident) ([place]) 07/27/2013  . Urticaria 11/06/2010  . Bronchitis 10/12/2010  . Preventative health care   . Atrial fibrillation ([place]) 04/04/2010  . Anxiety state 03/29/2010  . ATRIAL FLUTTER 03/29/2010  . RIB PAIN, LEFT SIDED 10/12/2009  . ALLERGIC RHINITIS 06/28/2009  . SHOULDER PAIN, LEFT 06/28/2009  . SCIATICA, RIGHT 06/28/2009  . HEADACHE 06/28/2009  .  DYSPNEA ON EXERTION 06/28/2009  . SKIN CANCER, HX OF 06/28/2009    Past Surgical History:  Procedure Laterality Date  . ACROMIOPLASTY  2000   right shoulder, arthroscopic  . HERNIA REPAIR  2003  . LUMBAR LAMINECTOMY/DECOMPRESSION MICRODISCECTOMY  04/01/2012   Procedure: LUMBAR LAMINECTOMY/DECOMPRESSION MICRODISCECTOMY 1 LEVEL;  Surgeon: Eustace Moore, MD;  Location: [facility];  Service: Neurosurgery;  Laterality: Right;  Right  Lumbar four-five extraforaminal microdiskectomy     Current Outpatient Rx  . Order #: [id number] Class: Historical Med  . Order #: [id number] Class: Print  . Order #: [id number] Class: Historical Med  . Order #: [id number] Class: Historical Med  . Order #: [id number] Class: Historical Med  . Order #: [id number] Class: Print    Allergies Tape  Family History  Problem Relation Age of Onset  . Stroke Sister   . Heart attack Neg Hx   . Hypertension Neg Hx     Social History Social History  Substance Use Topics  . Smoking status: Never Smoker  . Smokeless tobacco: Never Used     Comment: occ wine  . Alcohol use 0.6 oz/week    1 Shots of liquor per week     Comment: OCC cocktails    Review of Systems  Constitutional: No fever/chills Eyes: No visual changes. ENT: No sore throat. Cardiovascular: Positive chest pressure.  Respiratory: Positive shortness of breath. Gastrointestinal: No abdominal pain.  No nausea, no vomiting.  No diarrhea.  No constipation. Genitourinary: Negative for dysuria. Musculoskeletal: Negative for back pain. Skin: Negative for rash. Neurological: Negative for headaches, focal weakness or numbness.  10-point ROS otherwise negative.  ____________________________________________   PHYSICAL EXAM:  VITAL SIGNS: ED Triage Vitals  Enc Vitals Group     BP 06/28/16 0656 147/78     Pulse Rate 06/28/16 0656 67     Resp 06/28/16 0656 18     Temp 06/28/16 0656 98.1 F (36.7 C)     Temp Source 06/28/16 0656 Oral     SpO2 06/28/16 0650 94 %     Weight 06/28/16 0649 135 lb (61.2 kg)   Constitutional: Alert and oriented. Well appearing and in no acute distress. Eyes: Conjunctivae are normal.  Head: Atraumatic. Nose: No congestion/rhinnorhea. Mouth/Throat: Mucous membranes are moist.  Oropharynx non-erythematous. Neck: No stridor.  Cardiovascular: Normal rate, regular rhythm. Good peripheral circulation. Grossly normal heart sounds.   Respiratory: Normal  respiratory effort.  No retractions. Lungs CTAB. Gastrointestinal: Soft and nontender. No distention.  Musculoskeletal: No lower extremity tenderness nor edema. No gross deformities of extremities. Neurologic:  Normal speech and language. No gross focal neurologic deficits are appreciated.  Skin:  Skin is warm, dry and intact. No rash noted.  ____________________________________________   LABS (all labs ordered are listed, but only abnormal results are displayed)  Labs Reviewed  BASIC METABOLIC PANEL - Abnormal; Notable for the following:       Result Value   Calcium 8.6 (*)    GFR calc non Af Amer 54 (*)    All other components within normal limits  CBC - Abnormal; Notable for the following:    MCV 101.9 (*)    All other components within normal limits  BRAIN NATRIURETIC PEPTIDE - Abnormal; Notable for the following:    B Natriuretic Peptide 230.1 (*)    All other components within normal limits  I-STAT TROPOININ, ED   ____________________________________________  EKG   EKG Interpretation  Date/Time:  Friday June 28 2016 06:56:26 EST Ventricular Rate:  89 PR Interval:  QRS Duration: 93 QT Interval:  433 QTC Calculation: 527 R Axis:   -71 Text Interpretation:  Atrial fibrillation Ventricular premature complex Left anterior fascicular block Borderline repolarization abnormality Prolonged QT interval Baseline wander in lead(s) II III aVF V1 No STEMI.  Confirmed by Chen Holzman MD, Mical Kicklighter [phone]) on 06/28/2016 7:12:05 AM       ____________________________________________  RADIOLOGY  Dg Chest 2 View  Result Date: 06/28/2016 CLINICAL DATA:  Dyspnea and chest tightness EXAM: CHEST  2 VIEW COMPARISON:  Chest radiograph from one day prior. FINDINGS: Stable cardiomediastinal silhouette with mild cardiomegaly and aortic atherosclerosis. No pneumothorax. Small right pleural effusion appears stable. No left pleural effusion. Worsened mild to moderate pulmonary edema. Mild  bibasilar scarring versus atelectasis is stable. IMPRESSION: 1. Worsened mild-to-moderate congestive heart failure. 2. Stable small right pleural effusion. 3. Stable mild bibasilar scarring versus atelectasis. Electronically Signed   By: Ilona Sorrel M.D.   On: 06/28/2016 08:03    ____________________________________________   PROCEDURES  Procedure(s) performed:   Procedures  None ____________________________________________   INITIAL IMPRESSION / ASSESSMENT AND PLAN / ED COURSE  Pertinent labs & imaging results that were available during my care of the patient were reviewed by me and considered in my medical decision making (see chart for details).  Patient presents to the emergency department for evaluation of dyspnea, hypoxemia, and chest pressure. The patient was recently diagnosed with COPD using albuterol inhalers that improvement. The patient has a new oxygen requirement with O2 sat of 80% in the field. Breathing comfortably now. [place] suspicion for PE as the patient has few other signs of PE/DVT and has been compliant with Eliquis 2/2 a flutter. EKG unremarkable for ischemia. Plan for CXR, labs, and reassessment. Patient currently on 2L [place].   Discussed patient's case with hospitalist, Dr. Marily Memos.  He will see the patient and place orders. Patient and family (if present) updated with plan. Care transferred to hospitalist service. Patient did have 6 beats of V-tach seen on monitor of unknown clinical significance. Patient was asymptomatic during episode. Normal BP.   I reviewed all nursing notes, vitals, pertinent old records, EKGs, labs, imaging (as available).  ____________________________________________  FINAL CLINICAL IMPRESSION(S) / ED DIAGNOSES  Final diagnoses:  Shortness of breath     MEDICATIONS GIVEN DURING THIS VISIT:  Medications  furosemide (LASIX) injection 40 mg (not administered)     NEW OUTPATIENT MEDICATIONS STARTED DURING THIS  VISIT:  None   Note:  This document was prepared using Dragon voice recognition software and may include unintentional dictation errors.  Nanda Quinton, MD Emergency Medicine   Margette Fast, MD 06/28/16 [phone]

## 2016-06-28 NOTE — ED Notes (Signed)
Pt up to bedside commode with assistance from RN. Increased work of breathing noted but no distress. Pt remained around 86% SPO2 and improved to 90% once back in the bed.

## 2016-06-28 NOTE — ED Notes (Signed)
Pt is resident of [facility], recently lost Husband last month

## 2016-06-28 NOTE — H&P (Signed)
History and Physical    JLEE NYDEGGER [id number] DOB: 09/29/1923 DOA: 06/28/2016  PCP: Melinda Crutch, MD Patient coming from: snf  Chief Complaint: lip smacking and chest tightness.   HPI: Caroline Myers is a 82 y.o. female with medical history significant of anxiety, GERD, intracranial hemorrhage after fall, PAF, CVA. History provided in part by patient and patient's daughter. Over the last week patient has had intermittent URI type symptoms with occasional cough. Nasal saline with improvement of the symptoms. Occasional wheezing relieved by albuterol. Patient is a resident of [facility] where she recently moved on December 19. Denies any recent weight gain or lower extremity swelling but has had progressive dyspnea on exertion over the last week. Patient reports waking up in the middle the night with uncontrollable mouth movement, chest tightness, and a feeling of shortness of breath. Denies true chest pain, palpitations, fevers, recent nausea, vomiting, diarrhea, abdominal pain, dysuria, frequency, back pain, rash. EMS was called by [facility] staff to evaluate patient. O2 saturations were noted to be approximately 80% on room air. Patient was transported to [facility] for further treatment. Of note patient was seen by her primary care physician one day prior to admission with a chest x-ray that time without evidence of acute infectious process.  ED Course:  Lasix 40mg  iv given by EDP. Objective findings outlined below.  Review of Systems: As per HPI otherwise 10 point review of systems negative.   Ambulatory Status: No strictures.  Past Medical History:  Diagnosis Date  . Allergy    allergic rhinitis  . Anxiety   . Aortic stenosis    EF normal.  Mild aortic stenosis. Mild to moderate aortic regurgitation  . Arthritis   . Asthma 2000   mild. tried Advair after bronchospasm after exposure to cats  . Cancer [facility])    history of skin cancer of the nose  . GERD  (gastroesophageal reflux disease)    occ  . Intracranial hemorrhage following injury ([place]) 01/03/2015  . Paroxysmal atrial fibrillation (HCC)    pt does not want anticoagulation  . Shortness of breath    occ  . Shoulder pain, left 05/2010   with rotator cuff tear--Dr Alfonso Ramus  . Stroke [facility])     Past Surgical History:  Procedure Laterality Date  . ACROMIOPLASTY  2000   right shoulder, arthroscopic  . HERNIA REPAIR  2003  . LUMBAR LAMINECTOMY/DECOMPRESSION MICRODISCECTOMY  04/01/2012   Procedure: LUMBAR LAMINECTOMY/DECOMPRESSION MICRODISCECTOMY 1 LEVEL;  Surgeon: Eustace Moore, MD;  Location: [place] [facility];  Service: Neurosurgery;  Laterality: Right;  Right Lumbar four-five extraforaminal microdiskectomy     Social History   Social History  . Marital status: Married    Spouse name: Herbie [place]  . Number of children: 2  . Years of education: 12th   Occupational History  . RETIRED Retired   Social History Main Topics  . Smoking status: Never Smoker  . Smokeless tobacco: Never Used     Comment: occ wine  . Alcohol use 0.6 oz/week    1 Shots of liquor per week     Comment: OCC cocktails  . Drug use: No  . Sexual activity: Not on file   Other Topics Concern  . Not on file   Social History Narrative   Patient is married with 2 children.   Patient is right handed.   Patient has hs education.   Patient drinks 1 cup daily.    Allergies  Allergen Reactions  .  Tape Other (See Comments)    Rips skin    Family History  Problem Relation Age of Onset  . Stroke Sister   . Heart attack Neg Hx   . Hypertension Neg Hx     Prior to Admission medications   Medication Sig Start Date End Date Taking? Authorizing Provider  ALPRAZolam (XANAX) 0.25 MG tablet Take 0.25 mg by mouth 3 (three) times daily as needed for anxiety.   Yes Historical Provider, MD  apixaban (ELIQUIS) 5 MG TABS tablet Take 1 tablet (5 mg total) by mouth 2 (two) times daily. 07/28/15  Yes Daniel J Angiulli, PA-C    B Complex-C (B-COMPLEX WITH VITAMIN C) tablet Take 1 tablet by mouth daily.   Yes Historical Provider, MD  DULERA 200-5 MCG/ACT AERO Inhale 2 puffs into the lungs 2 (two) times daily.  03/14/16  Yes Historical Provider, MD  PROAIR HFA 108 (90 Base) MCG/ACT inhaler Inhale 2 puffs into the lungs every 6 (six) hours as needed for wheezing or shortness of breath.  02/19/16  Yes Historical Provider, MD  sertraline (ZOLOFT) 50 MG tablet Take 1 tablet (50 mg total) by mouth daily. 07/28/15  Yes Lavon Paganini Angiulli, PA-C    Physical Exam: Vitals:   06/28/16 0715 06/28/16 0750 06/28/16 0830 06/28/16 0900  BP: 140/78 137/81 150/88 129/81  Pulse: 66 74 73 88  Resp: 24 16 17 19   Temp:      TempSrc:      SpO2: 96% 96% 98% 98%  Weight:         General:  Appears calm and comfortable Eyes:  PERRL, EOMI, normal lids, iris ENT:  grossly normal hearing, lips & tongue, mmm Neck:  no LAD, masses or thyromegaly Cardiovascular:  RRR, no m/r/g. No LE edema.  Respiratory:  Crackles heard bilaterally. Normal effort. 2 L nasal cannula with O2 saturations 95%.  Abdomen:  soft, ntnd, NABS Skin:  no rash or induration seen on limited exam Musculoskeletal:  grossly normal tone BUE/BLE, good ROM, no bony abnormality Psychiatric:  grossly normal mood and affect, speech fluent and appropriate, AOx3 Neurologic:  CN 2-12 grossly intact, moves all extremities in coordinated fashion, sensation intact  Labs on Admission: I have personally reviewed following labs and imaging studies  CBC:  Recent Labs Lab 06/28/16 0717  WBC 7.3  HGB 13.5  HCT 42.2  MCV 101.9*  PLT 0000000   Basic Metabolic Panel:  Recent Labs Lab 06/28/16 0717  NA 137  K 3.6  CL 103  CO2 25  GLUCOSE 96  BUN 17  CREATININE 0.91  CALCIUM 8.6*   GFR: Estimated Creatinine Clearance: 36.3 mL/min (by C-G formula based on SCr of 0.91 mg/dL). Liver Function Tests: No results for input(s): AST, ALT, ALKPHOS, BILITOT, PROT, ALBUMIN in the last  168 hours. No results for input(s): LIPASE, AMYLASE in the last 168 hours. No results for input(s): AMMONIA in the last 168 hours. Coagulation Profile: No results for input(s): INR, PROTIME in the last 168 hours. Cardiac Enzymes: No results for input(s): CKTOTAL, CKMB, CKMBINDEX, TROPONINI in the last 168 hours. BNP (last 3 results) No results for input(s): PROBNP in the last 8760 hours. HbA1C: No results for input(s): HGBA1C in the last 72 hours. CBG: No results for input(s): GLUCAP in the last 168 hours. Lipid Profile: No results for input(s): CHOL, HDL, LDLCALC, TRIG, CHOLHDL, LDLDIRECT in the last 72 hours. Thyroid Function Tests: No results for input(s): TSH, T4TOTAL, FREET4, T3FREE, THYROIDAB in the last 72 hours.  Anemia Panel: No results for input(s): VITAMINB12, FOLATE, FERRITIN, TIBC, IRON, RETICCTPCT in the last 72 hours. Urine analysis:    Component Value Date/Time   COLORURINE YELLOW 08/02/2015 [place] 08/02/2015 0751   LABSPEC 1.019 08/02/2015 0751   PHURINE 5.5 08/02/2015 0751   GLUCOSEU NEGATIVE 08/02/2015 0751   GLUCOSEU NEG mg/dL 10/09/2009 2126   HGBUR NEGATIVE 08/02/2015 0751   BILIRUBINUR NEGATIVE 08/02/2015 0751   KETONESUR NEGATIVE 08/02/2015 0751   PROTEINUR NEGATIVE 08/02/2015 0751   UROBILINOGEN 0.2 02/14/2015 0435   NITRITE NEGATIVE 08/02/2015 0751   LEUKOCYTESUR NEGATIVE 08/02/2015 0751    Creatinine Clearance: Estimated Creatinine Clearance: 36.3 mL/min (by C-G formula based on SCr of 0.91 mg/dL).  Sepsis Labs: @LABRCNTIP (procalcitonin:4,lacticidven:4) )No results found for this or any previous visit (from the past 240 hour(s)).   Radiological Exams on Admission: Dg Chest 2 View  Result Date: 06/28/2016 CLINICAL DATA:  Dyspnea and chest tightness EXAM: CHEST  2 VIEW COMPARISON:  Chest radiograph from one day prior. FINDINGS: Stable cardiomediastinal silhouette with mild cardiomegaly and aortic atherosclerosis. No  pneumothorax. Small right pleural effusion appears stable. No left pleural effusion. Worsened mild to moderate pulmonary edema. Mild bibasilar scarring versus atelectasis is stable. IMPRESSION: 1. Worsened mild-to-moderate congestive heart failure. 2. Stable small right pleural effusion. 3. Stable mild bibasilar scarring versus atelectasis. Electronically Signed   By: Ilona Sorrel M.D.   On: 06/28/2016 08:03    EKG: Independently reviewed. Atrial fibrillation. No ACS.  Assessment/Plan Active Problems:   Atrial fibrillation (HCC)   Acute diastolic congestive heart failure (HCC)   CHF exacerbation (HCC)   Respiratory failure with hypoxia (HCC)   URI (upper respiratory infection)   Chest pressure   Oral dyskinesia   Acute hypoxic respiratory failure: Likely secondary to CHF exacerbation. BNP 230.1. Chest x-ray with pulmonary congestion and cardiomegaly especially compared to previous days film. No evidence of infectious process at this time. Patient may have some element of underlying mild viral respiratory infection causing increased cardiopulmonary strain. Last echo from 07/16/2015 showing [id number] EF and diastolic dysfunction. - Lasix IV 40 mg twice a day - Strict I's and O's, daily weights - CHF orderset  Lip Smacking: On description of symptoms which occurred when patient was acutely stressed and hypoxic. History of CVAs in the past. No evidence of focal neurological abnormality at this time. History of intracranial bleed after a fall on blood thinner. Suspect this simply may have been due to acute hypoxic stress on the brain. - Head CT  Chest pressure: Likely secondary to CHF exacerbation. PAF on EKG without ACS. Troponin normal. Relieved with oxygen - Cycle troponins, telemetry - O2  PAF: EKG indicating patient is currently in atrial fibrillation. - Continue atelectasis  Asthma: No evidence of acute exacerbation on exam at time of admission. Endorses recent intermittent wheezing. -  Continue albuterol, Dulera, Spiriva  Depression: - Continue Zoloft  URI: Mild for the past week. Appears to be slowly improving. Afebrile and normal white count. - Nasal saline, Tylenol  Anxiety: Worse since passing of her husband of 65 years approximately 4 weeks ago. - Continue Xanax     DVT prophylaxis: Eliquis  Code Status: full  Family Communication: daughter  Disposition Plan: pending imrpovement - anticipate overnight stay  Consults called: none  Admission status: obs - tele    Keiasia Christianson J MD [facility]  If 7PM-7AM, please contact night-coverage [email or web address] Password [facility]  06/28/2016, 9:51 AM

## 2016-06-28 NOTE — Progress Notes (Signed)
Patient is a resident of [facility] where she recently moved on December 19. CSW following for disposition and return to facility when medically appropriate.    Lorrine Kin, MSW, LCSW [facility] ED/41M Clinical Social Worker [phone]

## 2016-06-28 NOTE — ED Notes (Signed)
MD at bedside. 

## 2016-06-28 NOTE — ED Notes (Signed)
Awaiting meds from pharmacy  

## 2016-06-29 DIAGNOSIS — G249 Dystonia, unspecified: Secondary | ICD-10-CM | POA: Diagnosis present

## 2016-06-29 DIAGNOSIS — I482 Chronic atrial fibrillation: Secondary | ICD-10-CM

## 2016-06-29 DIAGNOSIS — G244 Idiopathic orofacial dystonia: Secondary | ICD-10-CM

## 2016-06-29 DIAGNOSIS — R0789 Other chest pain: Secondary | ICD-10-CM | POA: Diagnosis not present

## 2016-06-29 DIAGNOSIS — Z888 Allergy status to other drugs, medicaments and biological substances status: Secondary | ICD-10-CM | POA: Diagnosis not present

## 2016-06-29 DIAGNOSIS — Z66 Do not resuscitate: Secondary | ICD-10-CM | POA: Diagnosis present

## 2016-06-29 DIAGNOSIS — J449 Chronic obstructive pulmonary disease, unspecified: Secondary | ICD-10-CM | POA: Diagnosis present

## 2016-06-29 DIAGNOSIS — Z9889 Other specified postprocedural states: Secondary | ICD-10-CM | POA: Diagnosis not present

## 2016-06-29 DIAGNOSIS — Z8673 Personal history of transient ischemic attack (TIA), and cerebral infarction without residual deficits: Secondary | ICD-10-CM | POA: Diagnosis not present

## 2016-06-29 DIAGNOSIS — F419 Anxiety disorder, unspecified: Secondary | ICD-10-CM | POA: Diagnosis present

## 2016-06-29 DIAGNOSIS — J069 Acute upper respiratory infection, unspecified: Secondary | ICD-10-CM | POA: Diagnosis present

## 2016-06-29 DIAGNOSIS — I480 Paroxysmal atrial fibrillation: Secondary | ICD-10-CM | POA: Diagnosis present

## 2016-06-29 DIAGNOSIS — J9601 Acute respiratory failure with hypoxia: Secondary | ICD-10-CM

## 2016-06-29 DIAGNOSIS — I472 Ventricular tachycardia: Secondary | ICD-10-CM | POA: Diagnosis present

## 2016-06-29 DIAGNOSIS — I5031 Acute diastolic (congestive) heart failure: Principal | ICD-10-CM

## 2016-06-29 DIAGNOSIS — F329 Major depressive disorder, single episode, unspecified: Secondary | ICD-10-CM | POA: Diagnosis present

## 2016-06-29 DIAGNOSIS — Z79899 Other long term (current) drug therapy: Secondary | ICD-10-CM | POA: Diagnosis not present

## 2016-06-29 DIAGNOSIS — R0602 Shortness of breath: Secondary | ICD-10-CM | POA: Diagnosis not present

## 2016-06-29 DIAGNOSIS — I248 Other forms of acute ischemic heart disease: Secondary | ICD-10-CM | POA: Diagnosis present

## 2016-06-29 DIAGNOSIS — Z823 Family history of stroke: Secondary | ICD-10-CM | POA: Diagnosis not present

## 2016-06-29 DIAGNOSIS — K219 Gastro-esophageal reflux disease without esophagitis: Secondary | ICD-10-CM | POA: Diagnosis present

## 2016-06-29 LAB — BASIC METABOLIC PANEL
ANION GAP: 9 (ref 5–15)
BUN: 19 mg/dL (ref 6–20)
CALCIUM: 8.6 mg/dL — AB (ref 8.9–10.3)
CO2: 31 mmol/L (ref 22–32)
Chloride: 99 mmol/L — ABNORMAL LOW (ref 101–111)
Creatinine, Ser: 1.03 mg/dL — ABNORMAL HIGH (ref 0.44–1.00)
GFR calc Af Amer: 54 mL/min — ABNORMAL LOW (ref >60)
GFR, EST NON AFRICAN AMERICAN: 46 mL/min — AB (ref >60)
GLUCOSE: 92 mg/dL (ref 65–99)
Potassium: 3.7 mmol/L (ref 3.5–5.1)
SODIUM: 139 mmol/L (ref 135–145)

## 2016-06-29 LAB — CBC
HCT: 43.2 % (ref 36.0–46.0)
Hemoglobin: 14.2 g/dL (ref 12.0–15.0)
MCH: 32.6 pg (ref 26.0–34.0)
MCHC: 32.9 g/dL (ref 30.0–36.0)
MCV: 99.3 fL (ref 78.0–100.0)
Platelets: 232 10*3/uL (ref 150–400)
RBC: 4.35 MIL/uL (ref 3.87–5.11)
RDW: 14.7 % (ref 11.5–15.5)
WBC: 7.7 10*3/uL (ref 4.0–10.5)

## 2016-06-29 LAB — MAGNESIUM: MAGNESIUM: 1.9 mg/dL (ref 1.7–2.4)

## 2016-06-29 MED ORDER — FUROSEMIDE 10 MG/ML IJ SOLN
40.0000 mg | Freq: Every day | INTRAMUSCULAR | Status: DC
  Administered 2016-06-29: 40 mg via INTRAVENOUS

## 2016-06-29 MED ORDER — MAGNESIUM SULFATE 2 GM/50ML IV SOLN
2.0000 g | Freq: Once | INTRAVENOUS | Status: AC
  Administered 2016-06-29: 2 g via INTRAVENOUS
  Filled 2016-06-29: qty 50

## 2016-06-29 MED ORDER — POTASSIUM CHLORIDE CRYS ER 20 MEQ PO TBCR
40.0000 meq | EXTENDED_RELEASE_TABLET | Freq: Once | ORAL | Status: AC
  Administered 2016-06-29: 40 meq via ORAL
  Filled 2016-06-29: qty 2

## 2016-06-29 NOTE — Evaluation (Signed)
Physical Therapy Evaluation Patient Details Name: Caroline Myers MRN: [id number] DOB: 01/24/1924 Today's Date: 06/29/2016   History of Present Illness  Pt is a 82 y/o female admitted secondary to acute hypoxic respiratory failure. PMH including but not limited to PAF, hx of CVA, ICH following an injury in 2016 and lumbar laminectomy/decompression and microdiscectomy in 2013.  Clinical Impression  Pt presented supine in bed with HOB elevated, awake and willing to participate in therapy session. Prior to admission, pt reported that she was independent with all functional mobility and ADLs. Pt stated that she moved into the [facility] approximately one week ago for a "test drive" to see if she might like to live there permanently. She stated that she did not need the assistance, but since her husband passed very recently she did not want to live alone. Pt was very unsteady with gait initially which improved with progress. However, she continued to require min A to maintain upright balance while ambulating without an AD. Pt would continue to benefit from skilled physical therapy services at this time while admitted and after d/c to address her below listed limitations in order to improve her overall safety and independence with functional mobility.     Follow Up Recommendations Home health PT;Supervision for mobility/OOB    Equipment Recommendations  Rolling walker with 5" wheels    Recommendations for Other Services       Precautions / Restrictions Precautions Precautions: Fall Restrictions Weight Bearing Restrictions: No      Mobility  Bed Mobility Overal bed mobility: Needs Assistance Bed Mobility: Supine to Sit;Sit to Supine     Supine to sit: Supervision;HOB elevated Sit to supine: Supervision   General bed mobility comments: increased time, supervision for safety  Transfers Overall transfer level: Needs assistance Equipment used: None Transfers: Sit to/from  Stand Sit to Stand: Min assist         General transfer comment: pt required increased time and min A for stability  Ambulation/Gait Ambulation/Gait assistance: Min assist Ambulation Distance (Feet): 200 Feet Assistive device: 1 person hand held assist Gait Pattern/deviations: Step-through pattern;Decreased step length - right;Decreased step length - left;Decreased stride length;Drifts right/left   Gait velocity interpretation: at or above normal speed for age/gender General Gait Details: pt moderately unstable and requiring min A to maintain upright balance with one person HHA.   Stairs            Wheelchair Mobility    Modified Rankin (Stroke Patients Only)       Balance Overall balance assessment: Needs assistance Sitting-balance support: Feet supported;No upper extremity supported Sitting balance-Leahy Scale: Good     Standing balance support: During functional activity;Single extremity supported Standing balance-Leahy Scale: Poor Standing balance comment: pt reliant on at least one UE support                             Pertinent Vitals/Pain Pain Assessment: No/denies pain    Home Living Family/patient expects to be discharged to:: Assisted living [facility])                      Prior Function Level of Independence: Independent               Hand Dominance        Extremity/Trunk Assessment   Upper Extremity Assessment Upper Extremity Assessment: Overall WFL for tasks assessed    Lower Extremity Assessment Lower Extremity Assessment: Overall  WFL for tasks assessed    Cervical / Trunk Assessment Cervical / Trunk Assessment: Normal;Other exceptions Cervical / Trunk Exceptions: pt reported having scoliosis  Communication   Communication: No difficulties  Cognition Arousal/Alertness: Awake/alert Behavior During Therapy: WFL for tasks assessed/performed Overall Cognitive Status: Within Functional Limits for tasks  assessed                      General Comments      Exercises     Assessment/Plan    PT Assessment Patient needs continued PT services  PT Problem List Decreased balance;Decreased mobility;Decreased coordination;Decreased knowledge of use of DME;Decreased safety awareness          PT Treatment Interventions DME instruction;Gait training;Functional mobility training;Therapeutic activities;Therapeutic exercise;Balance training;Neuromuscular re-education;Patient/family education    PT Goals (Current goals can be found in the Care Plan section)  Acute Rehab PT Goals Patient Stated Goal: to get stronger and more stable with walking PT Goal Formulation: With patient Time For Goal Achievement: 07/13/16 Potential to Achieve Goals: Good    Frequency Min 3X/week   Barriers to discharge        Co-evaluation               End of Session Equipment Utilized During Treatment: Gait belt Activity Tolerance: Patient tolerated treatment well Patient left: in bed;with call bell/phone within reach;with bed alarm set Nurse Communication: Mobility status    Functional Assessment Tool Used: clinical judgement Functional Limitation: Mobility: Walking and moving around Mobility: Walking and Moving Around Current Status [id number]): At least 1 percent but less than 20 percent impaired, limited or restricted Mobility: Walking and Moving Around Goal Status [phone]): 0 percent impaired, limited or restricted    Time: 1430-1456 PT Time Calculation (min) (ACUTE ONLY): 26 min   Charges:   PT Evaluation $PT Eval Low Complexity: 1 Procedure PT Treatments $Gait Training: 8-22 mins   PT G Codes:   PT G-Codes **NOT FOR INPATIENT CLASS** Functional Assessment Tool Used: clinical judgement Functional Limitation: Mobility: Walking and moving around Mobility: Walking and Moving Around Current Status [id number]): At least 1 percent but less than 20 percent impaired, limited or  restricted Mobility: Walking and Moving Around Goal Status [phone]): 0 percent impaired, limited or restricted    [facility] 06/29/2016, 3:30 PM Sherie Don, [place], DPT [phone]

## 2016-06-29 NOTE — Progress Notes (Signed)
Notified MD of patients drop in BP since earlier in the day, patient asymptomatic as far as dizziness, just states she feels tired.  No new orders received, will continue to monitor.

## 2016-06-29 NOTE — Care Management Note (Signed)
Case Management Note  Patient Details  Name: Caroline Myers MRN: [id number] Date of Birth: 03-14-1924  Subjective/Objective:                  lip smacking and chest tightness. Action/Plan: Discharge planning Expected Discharge Date:                  Expected Discharge Plan:  Assisted Living / Rest Home  In-House Referral:  Clinical Social Work  Discharge planning Services  CM Consult  Post Acute Care Choice:    Choice offered to:  Patient  DME Arranged:  N/A DME Agency:  NA  HH Arranged:  NA HH Agency:  NA  Status of Service:  Completed, signed off  If discussed at [employer] of Stay Meetings, dates discussed:    Additional Comments: CM met with pt in room to discuss MOON and disposition. Pt states she lives at [facility] and will be returning upon discharge.  CSW aware and arranging.  No other CM needs were communicated. Dellie Catholic, RN 06/29/2016, 10:14 AM

## 2016-06-29 NOTE — Progress Notes (Signed)
RN walked patient in hallway off oxygen.  Patient ambulatory over almost entire length of unit, did get very tired and appeared winded when almost back to room.  Denied shortness of breath, O2 sats remained around 90% on room air entire time.  Patient back in bed, O2 sats 90-93% on room air.  Patient denied dizziness just stated she felt very weak.  BP 87/65.

## 2016-06-29 NOTE — Progress Notes (Signed)
PROGRESS NOTE        PATIENT DETAILS Name: Caroline Myers Age: 82 y.o. Sex: female Date of Birth: 07-27-1923 Admit Date: 06/28/2016 Admitting Physician Waldemar Dickens, MD [id number] Harrington Challenger, MD  Brief Narrative: Patient is a 82 y.o. female with past medical history of atrial fibrillation on Eliquis, history of CVA, aortic stenosis presented to the hospital with exertional dyspnea, chest tightness. Per EMS, patient reportedly had a oxygen saturation of 80%. She was found to have acute on chronic diastolic heart failure and admitted to the hospitalist service for further evaluation and treatment.   Subjective: Feels much better  Assessment/Plan: Acute hypoxemic respiratory failure: Secondary to acute diastolic heart failure, taper off oxygen as tolerated. Ambulate with physical therapy.  Acute diastolic heart failure: Clinically improved, decrease Lasix to daily dosing and reassess tomorrow. -1.7 L so far.  Chest tightness: Resolved. Doubt ACS-troponins only minimally elevated-likely demand ischemia. Given advanced age, frailty-doubt further workup required.  NSVT:occured this am-replete K/Mg-monitor in telemetry  ?Oral dyskinesia: Apparently on admission had lip smacking-this has resolved. CT head negative.  Chronic atrial fibrillation: Rate controlled without the use of any medications. Continue Eliquis-CHA2DS2-VASC score of 5  Bronchial asthma: No wheezing on exam,continue albuterol, Dulera, Spiriva  Depression: Appears stable-continue Zoloft  Anxiety: Worse since passing of her husband of 65 years approximately 4 weeks ago. Continue Xanax  DVT Prophylaxis: Full dose anticoagulation with Eliquis  Code Status: DNR-confirmed with daughter over the phone  Family Communication: Daughter over phone  Disposition Plan: Remain inpatient-back to ALF likely 12/31 if clinical improvement continues  Antimicrobial agents: Anti-infectives    None      Procedures: None  CONSULTS:  None  Time spent: 25 minutes-Greater than 50% of this time was spent in counseling, explanation of diagnosis, planning of further management, and coordination of care.  MEDICATIONS: Scheduled Meds: . apixaban  5 mg Oral BID  . [START ON 06/30/2016] furosemide  40 mg Intravenous Daily  . magnesium sulfate 1 - 4 g bolus IVPB  2 g Intravenous Once  . mometasone-formoterol  2 puff Inhalation BID  . sertraline  50 mg Oral Daily  . sodium chloride flush  3 mL Intravenous Q12H  . tiotropium  18 mcg Inhalation Daily   Continuous Infusions: PRN Meds:.acetaminophen **OR** acetaminophen, albuterol, ALPRAZolam, gi cocktail, ondansetron **OR** ondansetron (ZOFRAN) IV, sodium chloride   PHYSICAL EXAM: Vital signs: Vitals:   06/29/16 0220 06/29/16 0617 06/29/16 0839 06/29/16 1024  BP: 113/61 121/77  (!) 84/56  Pulse: 83 83  77  Resp: 19 19    Temp: 98.6 F (37 C) 98.7 F (37.1 C)    TempSrc: Oral Oral    SpO2: 99% 99% 98% 99%  Weight: 60.1 kg (132 lb 6.4 oz)     Height:       Filed Weights   06/28/16 0649 06/28/16 1519 06/29/16 0220  Weight: 61.2 kg (135 lb) 60.4 kg (133 lb 3.2 oz) 60.1 kg (132 lb 6.4 oz)   Body mass index is 22.03 kg/m.   General appearance :Awake, alert, not in any distress.  Eyes:, pupils equally reactive to light and accomodation,no scleral icterus HEENT: Atraumatic and Normocephalic Neck: supple, no JVD. No cervical lymphadenopathy. No thyromegaly Resp:Good air entry bilaterally,few bibasilar rales CVS: S1 S2 regular, syst murmur GI: Bowel sounds present, Non tender and not distended with no gaurding,  rigidity or rebound.No organomegaly Extremities: B/L Lower Ext shows no edema, both legs are warm to touch Neurology:  speech clear,Non focal, sensation is grossly intact. Psychiatric: Normal judgment and insight. Alert and oriented x 3. Musculoskeletal:No digital cyanosis Skin:No Rash, warm and dry Wounds:N/A  I have  personally reviewed following labs and imaging studies  LABORATORY DATA: CBC:  Recent Labs Lab 06/28/16 0717 06/29/16 0407  WBC 7.3 7.7  HGB 13.5 14.2  HCT 42.2 43.2  MCV 101.9* 99.3  PLT 188 [id number]    Basic Metabolic Panel:  Recent Labs Lab 06/28/16 0717 06/29/16 0407  NA 137 139  K 3.6 3.7  CL 103 99*  CO2 25 31  GLUCOSE 96 92  BUN 17 19  CREATININE 0.91 1.03*  CALCIUM 8.6* 8.6*  MG  --  1.9    GFR: Estimated Creatinine Clearance: 32.7 mL/min (by C-G formula based on SCr of 1.03 mg/dL (H)).  Liver Function Tests: No results for input(s): AST, ALT, ALKPHOS, BILITOT, PROT, ALBUMIN in the last 168 hours. No results for input(s): LIPASE, AMYLASE in the last 168 hours. No results for input(s): AMMONIA in the last 168 hours.  Coagulation Profile: No results for input(s): INR, PROTIME in the last 168 hours.  Cardiac Enzymes:  Recent Labs Lab 06/28/16 0948 06/28/16 1514  TROPONINI 0.05* 0.05*    BNP (last 3 results) No results for input(s): PROBNP in the last 8760 hours.  HbA1C: No results for input(s): HGBA1C in the last 72 hours.  CBG: No results for input(s): GLUCAP in the last 168 hours.  Lipid Profile: No results for input(s): CHOL, HDL, LDLCALC, TRIG, CHOLHDL, LDLDIRECT in the last 72 hours.  Thyroid Function Tests: No results for input(s): TSH, T4TOTAL, FREET4, T3FREE, THYROIDAB in the last 72 hours.  Anemia Panel: No results for input(s): VITAMINB12, FOLATE, FERRITIN, TIBC, IRON, RETICCTPCT in the last 72 hours.  Urine analysis:    Component Value Date/Time   COLORURINE YELLOW 08/02/2015 0751   APPEARANCEUR CLEAR 08/02/2015 0751   LABSPEC 1.019 08/02/2015 0751   PHURINE 5.5 08/02/2015 0751   GLUCOSEU NEGATIVE 08/02/2015 0751   GLUCOSEU NEG mg/dL 10/09/2009 2126   HGBUR NEGATIVE 08/02/2015 0751   BILIRUBINUR NEGATIVE 08/02/2015 0751   KETONESUR NEGATIVE 08/02/2015 0751   PROTEINUR NEGATIVE 08/02/2015 0751   UROBILINOGEN 0.2 02/14/2015  0435   NITRITE NEGATIVE 08/02/2015 0751   LEUKOCYTESUR NEGATIVE 08/02/2015 0751    Sepsis Labs: Lactic Acid, Venous    Component Value Date/Time   LATICACIDVEN 1.1 07/16/2015 0820    MICROBIOLOGY: No results found for this or any previous visit (from the past 240 hour(s)).  RADIOLOGY STUDIES/RESULTS: Dg Chest 2 View  Result Date: 06/28/2016 CLINICAL DATA:  Dyspnea and chest tightness EXAM: CHEST  2 VIEW COMPARISON:  Chest radiograph from one day prior. FINDINGS: Stable cardiomediastinal silhouette with mild cardiomegaly and aortic atherosclerosis. No pneumothorax. Small right pleural effusion appears stable. No left pleural effusion. Worsened mild to moderate pulmonary edema. Mild bibasilar scarring versus atelectasis is stable. IMPRESSION: 1. Worsened mild-to-moderate congestive heart failure. 2. Stable small right pleural effusion. 3. Stable mild bibasilar scarring versus atelectasis. Electronically Signed   By: Ilona Sorrel M.D.   On: 06/28/2016 08:03   Ct Head Wo Contrast  Result Date: 06/28/2016 CLINICAL DATA:  Fatigue the last few weeks. EXAM: CT HEAD WITHOUT CONTRAST TECHNIQUE: Contiguous axial images were obtained from the base of the skull through the vertex without intravenous contrast. COMPARISON:  MRI 03/08/2016 FINDINGS: Brain: There is atrophy  and chronic small vessel disease changes. No acute intracranial abnormality. Specifically, no hemorrhage, hydrocephalus, mass lesion, acute infarction, or significant intracranial injury. Vascular: No hyperdense vessel or unexpected calcification. Skull: No acute calvarial abnormality. Sinuses/Orbits: Visualized paranasal sinuses and mastoids clear. Orbital soft tissues unremarkable. Other: None IMPRESSION: No acute intracranial abnormality. Atrophy, chronic microvascular disease. Electronically Signed   By: Rolm Baptise M.D.   On: 06/28/2016 10:27     LOS: 0 days   Oren Binet, MD  [facility] Pager:[phone]  If  7PM-7AM, please contact night-coverage [email or web address] Password [email or web address] 06/29/2016, 11:32 AM

## 2016-06-29 NOTE — Care Management Obs Status (Signed)
[place] NOTIFICATION   Patient Details  Name: MAKENZYE ENGLEHART MRN: [id number] Date of Birth: [birth year]   Medicare Observation Status Notification Given:  Yes    Dellie Catholic, RN 06/29/2016, 10:12 AM

## 2016-06-30 DIAGNOSIS — R0789 Other chest pain: Secondary | ICD-10-CM

## 2016-06-30 LAB — BASIC METABOLIC PANEL
ANION GAP: 9 (ref 5–15)
BUN: 22 mg/dL — AB (ref 6–20)
CHLORIDE: 100 mmol/L — AB (ref 101–111)
CO2: 29 mmol/L (ref 22–32)
Calcium: 8.7 mg/dL — ABNORMAL LOW (ref 8.9–10.3)
Creatinine, Ser: 0.98 mg/dL (ref 0.44–1.00)
GFR calc Af Amer: 57 mL/min — ABNORMAL LOW (ref >60)
GFR, EST NON AFRICAN AMERICAN: 49 mL/min — AB (ref >60)
GLUCOSE: 94 mg/dL (ref 65–99)
Potassium: 4.1 mmol/L (ref 3.5–5.1)
Sodium: 138 mmol/L (ref 135–145)

## 2016-06-30 LAB — MAGNESIUM: Magnesium: 2.2 mg/dL (ref 1.7–2.4)

## 2016-06-30 MED ORDER — POTASSIUM CHLORIDE CRYS ER 20 MEQ PO TBCR: 40.0000 meq | EXTENDED_RELEASE_TABLET | Freq: Once | ORAL | Status: DC

## 2016-06-30 MED ORDER — FUROSEMIDE 20 MG PO TABS: 20.0000 mg | ORAL_TABLET | Freq: Every day | ORAL | 0 refills | Status: DC

## 2016-06-30 MED ORDER — POTASSIUM CHLORIDE ER 10 MEQ PO TBCR: 10.0000 meq | EXTENDED_RELEASE_TABLET | Freq: Every day | ORAL | 0 refills | Status: DC

## 2016-06-30 MED ORDER — TIOTROPIUM BROMIDE MONOHYDRATE 18 MCG IN CAPS: 18.0000 ug | ORAL_CAPSULE | Freq: Every day | RESPIRATORY_TRACT | 0 refills | Status: DC

## 2016-06-30 MED ORDER — PROAIR HFA 108 (90 BASE) MCG/ACT IN AERS: 2.0000 | INHALATION_SPRAY | RESPIRATORY_TRACT | 0 refills | Status: AC | PRN

## 2016-06-30 MED ORDER — FUROSEMIDE 20 MG PO TABS
20.0000 mg | ORAL_TABLET | Freq: Every day | ORAL | Status: DC
  Administered 2016-06-30: 20 mg via ORAL
  Filled 2016-06-30 (×2): qty 1

## 2016-06-30 MED ORDER — MAGNESIUM SULFATE 2 GM/50ML IV SOLN
2.0000 g | Freq: Once | INTRAVENOUS | Status: DC
  Filled 2016-06-30: qty 50

## 2016-06-30 MED ORDER — ALBUTEROL SULFATE (2.5 MG/3ML) 0.083% IN NEBU: 2.5000 mg | INHALATION_SOLUTION | RESPIRATORY_TRACT | 0 refills | Status: DC | PRN

## 2016-06-30 MED ORDER — ALPRAZOLAM 0.25 MG PO TABS: 0.2500 mg | ORAL_TABLET | Freq: Three times a day (TID) | ORAL | 0 refills | Status: DC | PRN

## 2016-06-30 NOTE — Progress Notes (Signed)
Discharge instructions reviewed with patient and daughter.  Questions answered, verbalized understanding.  Patient transported to front of hospital to be [facility] daughter.

## 2016-06-30 NOTE — Clinical Social Work Note (Signed)
CSW notified that pt is ready to DC back to [facility]. CSW notified family, dtr [name] Bilek [phone]  Who reported she will transport pt back to the facility. CSW contacted the facility, ok for pt to come back, facility needs FL2, which has been processed and sent to the facility. CSW updated nurse/RNCM. Pt has no other needs at this time.  CSW is signing off.  Kristan Votta B. Joline Maxcy Clinical Social Work Dept Weekend Social Worker [phone] 10:42 AM

## 2016-06-30 NOTE — NC FL2 (Signed)
[place] LEVEL OF CARE SCREENING TOOL     IDENTIFICATION  Patient Name: Caroline Myers Birthdate: [birth year] Sex: female Admission Date (Current Location): 06/28/2016  [facility] and [place] Number:  Herbalist and Address:  [facility] [place] [place] [address] [place], [place] [address]      Provider Number: [id number]  Attending Physician Name and Address:  Jonetta Osgood, MD  Relative Name and Phone Number:  Dtr Amija Dodrill [phone]    Current Level of Care: Hospital Recommended Level of Care: [place] Prior Approval Number:    Date Approved/Denied:   PASRR Number: [id number]  Discharge Plan: Other (Comment)    Current Diagnoses: Patient Active Problem List   Diagnosis Date Noted  . CHF exacerbation ([place]) 06/28/2016  . Respiratory failure with hypoxia ([place]) 06/28/2016  . URI (upper respiratory infection) 06/28/2016  . Chest pressure 06/28/2016  . Oral dyskinesia 06/28/2016  . PAF (paroxysmal atrial fibrillation) ([place])   . Falls 05/20/2016  . Pulmonary hypertension 03/15/2016  . Anxiety about health   . Cough   . Hypokalemia   . AKI (acute kidney injury) ([place])   . Acute diastolic congestive heart failure ([employer]) 07/20/2015  . Debility 07/20/2015  . Sepsis ([place]) 07/20/2015  . Acute on chronic diastolic congestive heart failure ([place])   . History of CVA (cerebrovascular accident)   . Tachypnea   . Leukocytosis 07/17/2015  . Arterial hypotension   . Hypotension 07/16/2015  . Elevated troponin 07/16/2015  . PNA (pneumonia)   . Severe sepsis ([place]) 07/15/2015  . Acute respiratory failure with hypercapnia ([place]) 07/15/2015  . Chronic anticoagulation 04/18/2015  . Orthostatic hypotension 03/09/2015  . CVA (cerebral infarction) 02/14/2015  . Stroke ([place]) 02/14/2015  . Chronic atrial fibrillation ([place]) 02/14/2015  . Stroke with cerebral ischemia ([place])   . Cerebral infarction due to embolism of left  middle cerebral artery ([place])   . Occipital fracture ([place])   . Benign essential HTN   . Other allergic rhinitis   . Depression   . SAH (subarachnoid hemorrhage) ([place]) 01/02/2015  . SDH (subdural hematoma) ([place]) 01/02/2015  . Aortic atherosclerosis ([place]) 08/05/2014  . Nonspecific abnormal unspecified cardiovascular function study 10/28/2013  . Aortic valve disorder 10/28/2013  . HLD (hyperlipidemia) 07/28/2013  . TIA (transient ischemic attack) 07/27/2013  . History of CHF (congestive heart failure) 07/27/2013  . CVA (cerebral vascular accident) ([place]) 07/27/2013  . Urticaria 11/06/2010  . Bronchitis 10/12/2010  . Preventative health care   . Atrial fibrillation ([place]) 04/04/2010  . Anxiety state 03/29/2010  . ATRIAL FLUTTER 03/29/2010  . RIB PAIN, LEFT SIDED 10/12/2009  . ALLERGIC RHINITIS 06/28/2009  . SHOULDER PAIN, LEFT 06/28/2009  . SCIATICA, RIGHT 06/28/2009  . HEADACHE 06/28/2009  . DYSPNEA ON EXERTION 06/28/2009  . SKIN CANCER, HX OF 06/28/2009    Orientation RESPIRATION BLADDER Height & Weight     Self, Time  Normal Continent Weight: 132 lb 6.4 oz (60.1 kg) Height:  5\' 5"  (165.1 cm)  BEHAVIORAL SYMPTOMS/MOOD NEUROLOGICAL BOWEL NUTRITION STATUS      Continent    AMBULATORY STATUS COMMUNICATION OF NEEDS Skin   Limited Assist Verbally Normal                       Personal Care Assistance Level of Assistance  Bathing, Dressing Bathing Assistance: Limited assistance   Dressing Assistance: Limited assistance     Functional Limitations Info  SPECIAL CARE FACTORS FREQUENCY  PT (By licensed PT), OT (By licensed OT)     PT Frequency: 5x wk OT Frequency: 5x wk            Contractures Contractures Info: Not present    Additional Factors Info  Code Status, Allergies Code Status Info: DNR Allergies Info: Tape           Current Medications (06/30/2016):  This is the current hospital active medication list Current Facility-Administered  Medications  Medication Dose Route Frequency Provider Last Rate Last Dose  . acetaminophen (TYLENOL) tablet 650 mg  650 mg Oral Q6H PRN Waldemar Dickens, MD   650 mg at 06/30/16 0151   Or  . acetaminophen (TYLENOL) suppository 650 mg  650 mg Rectal Q6H PRN Waldemar Dickens, MD      . albuterol (PROVENTIL) (2.5 MG/3ML) 0.083% nebulizer solution 2.5 mg  2.5 mg Nebulization Q4H PRN Waldemar Dickens, MD      . ALPRAZolam Duanne Moron) tablet 0.25 mg  0.25 mg Oral TID PRN Waldemar Dickens, MD   0.25 mg at 06/29/16 0825  . apixaban (ELIQUIS) tablet 5 mg  5 mg Oral BID Waldemar Dickens, MD   5 mg at 06/29/16 2208  . furosemide (LASIX) tablet 20 mg  20 mg Oral Daily Jonetta Osgood, MD      . gi cocktail (Maalox,Lidocaine,Donnatal)  30 mL Oral QID PRN Waldemar Dickens, MD      . mometasone-formoterol [facility]) 200-5 MCG/ACT inhaler 2 puff  2 puff Inhalation BID Waldemar Dickens, MD   2 puff at 06/29/16 1944  . ondansetron (ZOFRAN) tablet 4 mg  4 mg Oral Q6H PRN Waldemar Dickens, MD       Or  . ondansetron [facility]) injection 4 mg  4 mg Intravenous Q6H PRN Waldemar Dickens, MD   4 mg at 06/28/16 1151  . sertraline (ZOLOFT) tablet 50 mg  50 mg Oral Daily Waldemar Dickens, MD   50 mg at 06/29/16 1029  . sodium chloride (OCEAN) 0.65 % nasal spray 1 spray  1 spray Each Nare PRN Waldemar Dickens, MD      . sodium chloride flush (NS) 0.9 % injection 3 mL  3 mL Intravenous Q12H Waldemar Dickens, MD   3 mL at 06/29/16 2208  . tiotropium (SPIRIVA) inhalation capsule 18 mcg  18 mcg Inhalation Daily Waldemar Dickens, MD         Discharge Medications: Please see discharge summary for a list of discharge medications.  Relevant Imaging Results:  Relevant Lab Results:   Additional Information    Daemien Fronczak B, LCSWA

## 2016-06-30 NOTE — Discharge Summary (Signed)
PATIENT DETAILS Name: Caroline Myers Age: 82 y.o. Sex: female Date of Birth: [birth year] MRN: [id number]. Admitting Physician: Waldemar Dickens, MD [id number] Harrington Challenger, MD  Admit Date: 06/28/2016 Discharge date: 06/30/2016  Recommendations for Outpatient Follow-up:  1. Follow up with PCP in 1-2 weeks 2. Please obtain BMP/CBC in one week  Admitted From:  ALF   Disposition: ALF    Home Health: Yes  Equipment/Devices: DME Rolling walker  Discharge Condition: Stable  CODE STATUS: DNR  Diet recommendation:  Heart Healthy  Brief Summary: See H&P, Labs, Consult and Test reports for all details in brief, Patient is a 82 y.o. female with past medical history of atrial fibrillation on Eliquis, history of CVA, aortic stenosis presented to the hospital with exertional dyspnea, chest tightness. Per EMS, patient reportedly had a oxygen saturation of 80%. She was found to have acute on chronic diastolic heart failure and admitted to the hospitalist service for further evaluation and treatment.   Brief Hospital Course: Acute hypoxemic respiratory failure: Secondary to acute diastolic heart failure, tapered off oxygen-and now on room air.  Acute diastolic heart failure:Much more compensated, treated with IV Lasix. -1.8 L, weight decreased to 132 lb from 135 lbs on admit. No volume overload evident on exam today.Change Lasix to 20 mg daily. Follow with PCP.   Chest tightness: Resolved. Doubt ACS-troponins only minimally elevated-likely demand ischemia. Given advanced age, frailty-doubt further workup required.  ?Oral dyskinesia: Apparently on admission had lip smacking-this has resolved. CT head negative.  Chronic atrial fibrillation: Rate controlled without the use of any medications. Continue Eliquis-CHA2DS2-VASC score of 5  Bronchial asthma/COPD: No wheezing on exam,continue albuterol, Dulera, Spiriva  Depression: Appears stable-continue Zoloft  Anxiety: Worse since  passing of her husband of 65 years approximately 4 weeks ago. Continue Xanax  Procedures/Studies: None  Discharge Diagnoses:  Active Problems:   Atrial fibrillation (HCC)   Acute diastolic congestive heart failure (HCC)   CHF exacerbation (HCC)   Respiratory failure with hypoxia (HCC)   URI (upper respiratory infection)   Chest pressure   Oral dyskinesia   Discharge Instructions:  Activity:  As tolerated with Full fall precautions use walker/cane & assistance as needed  Discharge Instructions    (HEART FAILURE PATIENTS) Call MD:  Anytime you have any of the following symptoms: 1) 3 pound weight gain in 24 hours or 5 pounds in 1 week 2) shortness of breath, with or without a dry hacking cough 3) swelling in the hands, feet or stomach 4) if you have to sleep on extra pillows at night in order to breathe.    Complete by:  As directed    Call MD for:  difficulty breathing, headache or visual disturbances    Complete by:  As directed    Diet - low sodium heart healthy    Complete by:  As directed    Increase activity slowly    Complete by:  As directed      Allergies as of 06/30/2016      Reactions   Tape Other (See Comments)   Rips skin      Medication List    TAKE these medications   ALPRAZolam 0.25 MG tablet Commonly known as:  XANAX Take 1 tablet (0.25 mg total) by mouth 3 (three) times daily as needed for anxiety.   apixaban 5 MG Tabs tablet Commonly known as:  ELIQUIS Take 1 tablet (5 mg total) by mouth 2 (two) times daily.   B-complex with vitamin C tablet Take  1 tablet by mouth daily.   DULERA 200-5 MCG/ACT Aero Generic drug:  mometasone-formoterol Inhale 2 puffs into the lungs 2 (two) times daily.   furosemide 20 MG tablet Commonly known as:  LASIX Take 1 tablet (20 mg total) by mouth daily.   potassium chloride 10 MEQ tablet Commonly known as:  K-DUR Take 1 tablet (10 mEq total) by mouth daily.   PROAIR HFA 108 (90 Base) MCG/ACT inhaler Generic  drug:  albuterol Inhale 2 puffs into the lungs every 4 (four) hours as needed for wheezing or shortness of breath. What changed:  when to take this   albuterol (2.5 MG/3ML) 0.083% nebulizer solution Commonly known as:  PROVENTIL Take 3 mLs (2.5 mg total) by nebulization every 4 (four) hours as needed for wheezing or shortness of breath. What changed:  You were already taking a medication with the same name, and this prescription was added. Make sure you understand how and when to take each.   sertraline 50 MG tablet Commonly known as:  ZOLOFT Take 1 tablet (50 mg total) by mouth daily.   tiotropium 18 MCG inhalation capsule Commonly known as:  SPIRIVA Place 1 capsule (18 mcg total) into inhaler and inhale daily.            Durable Medical Equipment        Start     Ordered   06/30/16 [phone]  For home use only DME Nebulizer machine  Once    Question:  Patient needs a nebulizer to treat with the following condition  Answer:  COPD (chronic obstructive pulmonary disease) ([place])   06/30/16 0853   06/30/16 0848  For home use only DME Walker rolling  Once    Question:  Patient needs a walker to treat with the following condition  Answer:  Dyspnea   06/30/16 0847     Follow-up Information    Melinda Crutch, MD. Schedule an appointment as soon as possible for a visit in 2 week(s).   Specialty:  Family Medicine Contact information: [place] [address] [phone]          Allergies  Allergen Reactions  . Tape Other (See Comments)    Rips skin    Consultations:   None  Other Procedures/Studies: Dg Chest 2 View  Result Date: 06/28/2016 CLINICAL DATA:  Dyspnea and chest tightness EXAM: CHEST  2 VIEW COMPARISON:  Chest radiograph from one day prior. FINDINGS: Stable cardiomediastinal silhouette with mild cardiomegaly and aortic atherosclerosis. No pneumothorax. Small right pleural effusion appears stable. No left pleural effusion. Worsened mild to  moderate pulmonary edema. Mild bibasilar scarring versus atelectasis is stable. IMPRESSION: 1. Worsened mild-to-moderate congestive heart failure. 2. Stable small right pleural effusion. 3. Stable mild bibasilar scarring versus atelectasis. Electronically Signed   By: Ilona Sorrel M.D.   On: 06/28/2016 08:03   Ct Head Wo Contrast  Result Date: 06/28/2016 CLINICAL DATA:  Fatigue the last few weeks. EXAM: CT HEAD WITHOUT CONTRAST TECHNIQUE: Contiguous axial images were obtained from the base of the skull through the vertex without intravenous contrast. COMPARISON:  MRI 03/08/2016 FINDINGS: Brain: There is atrophy and chronic small vessel disease changes. No acute intracranial abnormality. Specifically, no hemorrhage, hydrocephalus, mass lesion, acute infarction, or significant intracranial injury. Vascular: No hyperdense vessel or unexpected calcification. Skull: No acute calvarial abnormality. Sinuses/Orbits: Visualized paranasal sinuses and mastoids clear. Orbital soft tissues unremarkable. Other: None IMPRESSION: No acute intracranial abnormality. Atrophy, chronic microvascular disease. Electronically Signed   By: Lennette Bihari  [place] M.D.   On: 06/28/2016 10:27     TODAY-DAY OF DISCHARGE:  Subjective:   Unk [place] today has no headache,no chest abdominal pain,no new weakness tingling or numbness, feels much better wants to go home today.   Objective:   Blood pressure (!) 106/59, pulse 84, temperature 97.4 F (36.3 C), temperature source Oral, resp. rate 18, height 5\' 5"  (1.651 m), weight 60.1 kg (132 lb 6.4 oz), SpO2 94 %.  Intake/Output Summary (Last 24 hours) at 06/30/16 0910 Last data filed at 06/29/16 1847  Gross per 24 hour  Intake              480 ml  Output              900 ml  Net             -420 ml   Filed Weights   06/28/16 0649 06/28/16 1519 06/29/16 0220  Weight: 61.2 kg (135 lb) 60.4 kg (133 lb 3.2 oz) 60.1 kg (132 lb 6.4 oz)    Exam: Awake Alert, Oriented *3, No new F.N  deficits, Normal affect [place].AT,PERRAL Supple Neck,No JVD, No cervical lymphadenopathy appriciated.  Symmetrical Chest wall movement, Good air movement bilaterally, CTAB RRR,No Gallops,Rubs or new Murmurs, No Parasternal Heave +ve B.Sounds, Abd Soft, Non tender, No organomegaly appriciated, No rebound -guarding or rigidity. No Cyanosis, Clubbing or edema, No new Rash or bruise   PERTINENT RADIOLOGIC STUDIES: Dg Chest 2 View  Result Date: 06/28/2016 CLINICAL DATA:  Dyspnea and chest tightness EXAM: CHEST  2 VIEW COMPARISON:  Chest radiograph from one day prior. FINDINGS: Stable cardiomediastinal silhouette with mild cardiomegaly and aortic atherosclerosis. No pneumothorax. Small right pleural effusion appears stable. No left pleural effusion. Worsened mild to moderate pulmonary edema. Mild bibasilar scarring versus atelectasis is stable. IMPRESSION: 1. Worsened mild-to-moderate congestive heart failure. 2. Stable small right pleural effusion. 3. Stable mild bibasilar scarring versus atelectasis. Electronically Signed   By: Ilona Sorrel M.D.   On: 06/28/2016 08:03   Ct Head Wo Contrast  Result Date: 06/28/2016 CLINICAL DATA:  Fatigue the last few weeks. EXAM: CT HEAD WITHOUT CONTRAST TECHNIQUE: Contiguous axial images were obtained from the base of the skull through the vertex without intravenous contrast. COMPARISON:  MRI 03/08/2016 FINDINGS: Brain: There is atrophy and chronic small vessel disease changes. No acute intracranial abnormality. Specifically, no hemorrhage, hydrocephalus, mass lesion, acute infarction, or significant intracranial injury. Vascular: No hyperdense vessel or unexpected calcification. Skull: No acute calvarial abnormality. Sinuses/Orbits: Visualized paranasal sinuses and mastoids clear. Orbital soft tissues unremarkable. Other: None IMPRESSION: No acute intracranial abnormality. Atrophy, chronic microvascular disease. Electronically Signed   By: Rolm Baptise M.D.   On:  06/28/2016 10:27     PERTINENT LAB RESULTS: CBC:  Recent Labs  06/28/16 0717 06/29/16 0407  WBC 7.3 7.7  HGB 13.5 14.2  HCT 42.2 43.2  PLT 188 232   CMET CMP     Component Value Date/Time   NA 138 06/30/2016 0657   K 4.1 06/30/2016 0657   CL 100 (L) 06/30/2016 0657   CO2 29 06/30/2016 0657   GLUCOSE 94 06/30/2016 0657   BUN 22 (H) 06/30/2016 0657   CREATININE 0.98 06/30/2016 0657   CREATININE 0.81 11/02/2010 1337   CALCIUM 8.7 (L) 06/30/2016 0657   PROT 5.5 (L) 08/02/2015 0637   ALBUMIN 2.7 (L) 08/02/2015 0637   AST 21 08/02/2015 0637   ALT 23 08/02/2015 0637   ALKPHOS 91 08/02/2015 0637   BILITOT  0.8 08/02/2015 0637   GFRNONAA 49 (L) 06/30/2016 0657   GFRNONAA >60 11/02/2010 1337   GFRAA 57 (L) 06/30/2016 0657   GFRAA >60 11/02/2010 1337    GFR Estimated Creatinine Clearance: 34.3 mL/min (by C-G formula based on SCr of 0.98 mg/dL). No results for input(s): LIPASE, AMYLASE in the last 72 hours.  Recent Labs  06/28/16 0948 06/28/16 1514  TROPONINI 0.05* 0.05*   Invalid input(s): POCBNP No results for input(s): DDIMER in the last 72 hours. No results for input(s): HGBA1C in the last 72 hours. No results for input(s): CHOL, HDL, LDLCALC, TRIG, CHOLHDL, LDLDIRECT in the last 72 hours. No results for input(s): TSH, T4TOTAL, T3FREE, THYROIDAB in the last 72 hours.  Invalid input(s): FREET3 No results for input(s): VITAMINB12, FOLATE, FERRITIN, TIBC, IRON, RETICCTPCT in the last 72 hours. Coags: No results for input(s): INR in the last 72 hours.  Invalid input(s): PT Microbiology: No results found for this or any previous visit (from the past 240 hour(s)).  FURTHER DISCHARGE INSTRUCTIONS:  Get Medicines reviewed and adjusted: Please take all your medications with you for your next visit with your Primary MD  Laboratory/radiological data: Please request your Primary MD to go over all hospital tests and procedure/radiological results at the follow up,  please ask your Primary MD to get all Hospital records sent to his/her office.  In some cases, they will be blood work, cultures and biopsy results pending at the time of your discharge. Please request that your primary care M.D. goes through all the records of your hospital data and follows up on these results.  Also Note the following: If you experience worsening of your admission symptoms, develop shortness of breath, life threatening emergency, suicidal or homicidal thoughts you must seek medical attention immediately by calling 911 or calling your MD immediately  if symptoms less severe.  You must read complete instructions/literature along with all the possible adverse reactions/side effects for all the Medicines you take and that have been prescribed to you. Take any new Medicines after you have completely understood and accpet all the possible adverse reactions/side effects.   Do not drive when taking Pain medications or sleeping medications (Benzodaizepines)  Do not take more than prescribed Pain, Sleep and Anxiety Medications. It is not advisable to combine anxiety,sleep and pain medications without talking with your primary care practitioner  Special Instructions: If you have smoked or chewed Tobacco  in the last 2 yrs please stop smoking, stop any regular Alcohol  and or any Recreational drug use.  Wear Seat belts while driving.  Please note: You were cared for by a hospitalist during your hospital stay. Once you are discharged, your primary care physician will handle any further medical issues. Please note that NO REFILLS for any discharge medications will be authorized once you are discharged, as it is imperative that you return to your primary care physician (or establish a relationship with a primary care physician if you do not have one) for your post hospital discharge needs so that they can reassess your need for medications and monitor your lab values.  Total Time spent  coordinating discharge including counseling, education and face to face time equals  45 minutes.  SignedOren Binet 06/30/2016 9:10 AM

## 2016-06-30 NOTE — Clinical Social Work Placement (Signed)
   CLINICAL SOCIAL WORK PLACEMENT  NOTE  Date:  06/30/2016  Patient Details  Name: Caroline Myers MRN: [id number] Date of Birth: Jun 27, 1924  Clinical Social Work is seeking post-discharge placement for this patient at the [place] level of care (*CSW will initial, date and re-position this form in  chart as items are completed):  Yes   Patient/family provided with [facility] Work Department's list of facilities offering this level of care within the geographic area requested by the patient (or if unable, by the patient's family).  Yes   Patient/family informed of their freedom to choose among providers that offer the needed level of care, that participate in Medicare, Medicaid or managed care program needed by the patient, have an available bed and are willing to accept the patient.  Yes   Patient/family informed of [facility]'s ownership interest in [facility] and [facility], as well as of the fact that they are under no obligation to receive care at these facilities.  PASRR submitted to EDS on 06/23/16     PASRR number received on 06/30/16     Existing PASRR number confirmed on       FL2 transmitted to all facilities in geographic area requested by pt/family on       FL2 transmitted to all facilities within larger geographic area on       Patient informed that his/her managed care company has contracts with or will negotiate with certain facilities, including the following:            Patient/family informed of bed offers received.  Patient chooses bed at       Physician recommends and patient chooses bed at      Patient to be transferred to   on  .  Patient to be transferred to facility by  (Family)     Patient family notified on 06/30/16 of transfer.  Name of family member notified:  [name] Corron [phone]     PHYSICIAN Please sign FL2     Additional Comment:     _______________________________________________ Serafina Mitchell, [place] 06/30/2016, 10:36 AM

## 2016-06-30 NOTE — Care Management Note (Addendum)
Case Management Note  Patient Details  Name: Caroline Myers MRN: [id number] Date of Birth: 1923-10-17  Subjective/Objective:                  exertional dyspnea, chest tightness Action/Plan: Discharge planning Expected Discharge Date:  06/30/16               Expected Discharge Plan:  Assisted Living / Rest Home  In-House Referral:  Clinical Social Work  Discharge planning Services  CM Consult  Post Acute Care Choice:    Choice offered to:  Adult Children  DME Arranged:  Nebulizer machine DME Agency:  NA  HH Arranged:  PT HH Agency:  Other - See comment  Status of Service:  Completed, signed off  If discussed at [place] of Stay Meetings, dates discussed:    Additional Comments: CM spoke with daughter of pt concerning DMe and daughter states pt does NOT need rollling walker or 3n1.  Cm notified [facility] DME rep, Reggie to please deliver the neb machine to pt room prior to discharge. Daughter will provide transportation to [employer].  Cm has notified CSW for [facility] and home health orders to be sent with pt. [facility] rep states HHPT is an internal service and this CM has faxed the [facility] orders and F2F to [phone].  NO other CM needs were communicated. Dellie Catholic, RN 06/30/2016, 10:00 AM

## 2016-07-03 DIAGNOSIS — F419 Anxiety disorder, unspecified: Secondary | ICD-10-CM | POA: Diagnosis not present

## 2016-07-03 DIAGNOSIS — I4891 Unspecified atrial fibrillation: Secondary | ICD-10-CM | POA: Diagnosis not present

## 2016-07-03 DIAGNOSIS — M419 Scoliosis, unspecified: Secondary | ICD-10-CM | POA: Diagnosis not present

## 2016-07-03 DIAGNOSIS — M519 Unspecified thoracic, thoracolumbar and lumbosacral intervertebral disc disorder: Secondary | ICD-10-CM | POA: Diagnosis not present

## 2016-07-03 DIAGNOSIS — Z7901 Long term (current) use of anticoagulants: Secondary | ICD-10-CM | POA: Diagnosis not present

## 2016-07-03 DIAGNOSIS — J449 Chronic obstructive pulmonary disease, unspecified: Secondary | ICD-10-CM | POA: Diagnosis not present

## 2016-07-03 DIAGNOSIS — Z8781 Personal history of (healed) traumatic fracture: Secondary | ICD-10-CM | POA: Diagnosis not present

## 2016-07-03 DIAGNOSIS — E7800 Pure hypercholesterolemia, unspecified: Secondary | ICD-10-CM | POA: Diagnosis not present

## 2016-07-03 DIAGNOSIS — M159 Polyosteoarthritis, unspecified: Secondary | ICD-10-CM | POA: Diagnosis not present

## 2016-07-03 DIAGNOSIS — Z87820 Personal history of traumatic brain injury: Secondary | ICD-10-CM | POA: Diagnosis not present

## 2016-07-03 DIAGNOSIS — Z9181 History of falling: Secondary | ICD-10-CM | POA: Diagnosis not present

## 2016-07-04 DIAGNOSIS — J449 Chronic obstructive pulmonary disease, unspecified: Secondary | ICD-10-CM | POA: Diagnosis not present

## 2016-07-04 DIAGNOSIS — F419 Anxiety disorder, unspecified: Secondary | ICD-10-CM | POA: Diagnosis not present

## 2016-07-04 DIAGNOSIS — M519 Unspecified thoracic, thoracolumbar and lumbosacral intervertebral disc disorder: Secondary | ICD-10-CM | POA: Diagnosis not present

## 2016-07-04 DIAGNOSIS — M159 Polyosteoarthritis, unspecified: Secondary | ICD-10-CM | POA: Diagnosis not present

## 2016-07-04 DIAGNOSIS — M419 Scoliosis, unspecified: Secondary | ICD-10-CM | POA: Diagnosis not present

## 2016-07-04 DIAGNOSIS — I4891 Unspecified atrial fibrillation: Secondary | ICD-10-CM | POA: Diagnosis not present

## 2016-07-05 ENCOUNTER — Encounter (HOSPITAL_COMMUNITY): Payer: Self-pay

## 2016-07-05 ENCOUNTER — Emergency Department (HOSPITAL_COMMUNITY): Payer: Medicare Other

## 2016-07-05 ENCOUNTER — Emergency Department (HOSPITAL_COMMUNITY)
Admission: EM | Admit: 2016-07-05 | Discharge: 2016-07-05 | Disposition: A | Discharge status: Home / Self Care | Payer: Medicare Other | Attending: Emergency Medicine | Admitting: Emergency Medicine

## 2016-07-05 DIAGNOSIS — W19XXXA Unspecified fall, initial encounter: Secondary | ICD-10-CM

## 2016-07-05 DIAGNOSIS — Z7901 Long term (current) use of anticoagulants: Secondary | ICD-10-CM | POA: Diagnosis not present

## 2016-07-05 DIAGNOSIS — S0181XD Laceration without foreign body of other part of head, subsequent encounter: Secondary | ICD-10-CM | POA: Insufficient documentation

## 2016-07-05 DIAGNOSIS — Z8673 Personal history of transient ischemic attack (TIA), and cerebral infarction without residual deficits: Secondary | ICD-10-CM | POA: Diagnosis not present

## 2016-07-05 DIAGNOSIS — I110 Hypertensive heart disease with heart failure: Secondary | ICD-10-CM | POA: Insufficient documentation

## 2016-07-05 DIAGNOSIS — Z85828 Personal history of other malignant neoplasm of skin: Secondary | ICD-10-CM | POA: Diagnosis not present

## 2016-07-05 DIAGNOSIS — I5033 Acute on chronic diastolic (congestive) heart failure: Secondary | ICD-10-CM | POA: Insufficient documentation

## 2016-07-05 DIAGNOSIS — Y929 Unspecified place or not applicable: Secondary | ICD-10-CM | POA: Insufficient documentation

## 2016-07-05 DIAGNOSIS — W01198A Fall on same level from slipping, tripping and stumbling with subsequent striking against other object, initial encounter: Secondary | ICD-10-CM | POA: Diagnosis not present

## 2016-07-05 DIAGNOSIS — Y939 Activity, unspecified: Secondary | ICD-10-CM | POA: Insufficient documentation

## 2016-07-05 DIAGNOSIS — S0181XA Laceration without foreign body of other part of head, initial encounter: Secondary | ICD-10-CM | POA: Diagnosis not present

## 2016-07-05 DIAGNOSIS — S0091XA Abrasion of unspecified part of head, initial encounter: Secondary | ICD-10-CM | POA: Diagnosis not present

## 2016-07-05 DIAGNOSIS — Y999 Unspecified external cause status: Secondary | ICD-10-CM | POA: Diagnosis not present

## 2016-07-05 DIAGNOSIS — S0990XA Unspecified injury of head, initial encounter: Secondary | ICD-10-CM | POA: Diagnosis present

## 2016-07-05 DIAGNOSIS — J449 Chronic obstructive pulmonary disease, unspecified: Secondary | ICD-10-CM | POA: Diagnosis not present

## 2016-07-05 DIAGNOSIS — S0232XA Fracture of orbital floor, left side, initial encounter for closed fracture: Secondary | ICD-10-CM | POA: Diagnosis not present

## 2016-07-05 DIAGNOSIS — S0230XA Fracture of orbital floor, unspecified side, initial encounter for closed fracture: Secondary | ICD-10-CM

## 2016-07-05 DIAGNOSIS — S60222A Contusion of left hand, initial encounter: Secondary | ICD-10-CM | POA: Diagnosis not present

## 2016-07-05 MED ORDER — CEPHALEXIN 250 MG PO CAPS
250.0000 mg | ORAL_CAPSULE | Freq: Once | ORAL | Status: AC
  Administered 2016-07-05: 250 mg via ORAL
  Filled 2016-07-05: qty 1

## 2016-07-05 MED ORDER — CEPHALEXIN 250 MG PO CAPS
250.0000 mg | ORAL_CAPSULE | Freq: Four times a day (QID) | ORAL | 0 refills | Status: DC
Start: 2016-07-05 — End: 2016-08-21

## 2016-07-05 NOTE — ED Provider Notes (Signed)
[facility] DEPT Provider Note   CSN: [id number] Arrival date & time: 07/05/16  1955     History   Chief Complaint Chief Complaint  Patient presents with  . Fall    HPI Caroline Myers is a 83 y.o. female.  This is a 83 year old female who tripped on a curb on her way to her 35 office, causing lacerations to the left for head and left side of the face.Initially had double vison that appears to have resolved.The lacerations  were sutured by her physician.  Patient was discharged home on arrival.  Daughter noticed that there was some bleeding from the suture sites.  She is on Eliquis.  EMS was called and transported the patient for further evaluation.  He states that she took an extra take Tylenol for pain.  When she got home, but has a persistent headache, no nausea or visual disturbance.  The left eye is swollen shut.       Past Medical History:  Diagnosis Date  . Allergic rhinitis   . Anxiety   . Aortic stenosis    EF normal.  Mild aortic stenosis. Mild to moderate aortic regurgitation  . Arthritis    "neck, spine, hands" (06/28/2016)  . Asthma 2000   mild. tried Advair after bronchospasm after exposure to cats  . CHF (congestive heart failure) ([place]) dx'd 06/28/2016  . COPD (chronic obstructive pulmonary disease) ([place])   . GERD (gastroesophageal reflux disease)    occ  . Intracranial hemorrhage following injury ([place]) 01/03/2015  . Paroxysmal atrial fibrillation (HCC)    pt does not want anticoagulation  . Pneumonia 07/2015  . Scoliosis   . Shortness of breath    occ  . Shoulder pain, left 05/2010   with rotator cuff tear--Dr Alfonso Ramus  . Skin cancer of nose    "MOHS; think it was basal"  . Squamous carcinoma    "burned off my face"  . Stroke [facility]) 1/ 2014; 12/2014   denies residual on 06/28/2016    Patient Active Problem List   Diagnosis Date Noted  . CHF exacerbation ([place]) 06/28/2016  . Respiratory failure with hypoxia ([place]) 06/28/2016  . URI  (upper respiratory infection) 06/28/2016  . Chest pressure 06/28/2016  . Oral dyskinesia 06/28/2016  . PAF (paroxysmal atrial fibrillation) ([place])   . Falls 05/20/2016  . Pulmonary hypertension 03/15/2016  . Anxiety about health   . Cough   . Hypokalemia   . AKI (acute kidney injury) ([place])   . Acute diastolic congestive heart failure ([place]) 07/20/2015  . Debility 07/20/2015  . Sepsis ([place]) 07/20/2015  . Acute on chronic diastolic congestive heart failure ([place])   . History of CVA (cerebrovascular accident)   . Tachypnea   . Leukocytosis 07/17/2015  . Arterial hypotension   . Hypotension 07/16/2015  . Elevated troponin 07/16/2015  . PNA (pneumonia)   . Severe sepsis ([place]) 07/15/2015  . Acute respiratory failure with hypercapnia ([place]) 07/15/2015  . Chronic anticoagulation 04/18/2015  . Orthostatic hypotension 03/09/2015  . CVA (cerebral infarction) 02/14/2015  . Stroke ([place]) 02/14/2015  . Chronic atrial fibrillation ([place]) 02/14/2015  . Stroke with cerebral ischemia ([place])   . Cerebral infarction due to embolism of left middle cerebral artery ([place])   . Occipital fracture ([place])   . Benign essential HTN   . Other allergic rhinitis   . Depression   . SAH (subarachnoid hemorrhage) ([place]) 01/02/2015  . SDH (subdural hematoma) ([place]) 01/02/2015  . Aortic atherosclerosis ([place]) 08/05/2014  . Nonspecific abnormal  unspecified cardiovascular function study 10/28/2013  . Aortic valve disorder 10/28/2013  . HLD (hyperlipidemia) 07/28/2013  . TIA (transient ischemic attack) 07/27/2013  . History of CHF (congestive heart failure) 07/27/2013  . CVA (cerebral vascular accident) ([place]) 07/27/2013  . Urticaria 11/06/2010  . Bronchitis 10/12/2010  . Preventative health care   . Atrial fibrillation ([place]) 04/04/2010  . Anxiety state 03/29/2010  . ATRIAL FLUTTER 03/29/2010  . RIB PAIN, LEFT SIDED 10/12/2009  . ALLERGIC RHINITIS 06/28/2009  . SHOULDER PAIN, LEFT 06/28/2009  . SCIATICA, RIGHT  06/28/2009  . HEADACHE 06/28/2009  . DYSPNEA ON EXERTION 06/28/2009  . SKIN CANCER, HX OF 06/28/2009    Past Surgical History:  Procedure Laterality Date  . BACK SURGERY    . CATARACT EXTRACTION W/ INTRAOCULAR LENS  IMPLANT, BILATERAL Bilateral   . DILATION AND CURETTAGE OF UTERUS    . HERNIA REPAIR  2003  . LUMBAR LAMINECTOMY/DECOMPRESSION MICRODISCECTOMY  04/01/2012   Procedure: LUMBAR LAMINECTOMY/DECOMPRESSION MICRODISCECTOMY 1 LEVEL;  Surgeon: Eustace Moore, MD;  Location: [facility];  Service: Neurosurgery;  Laterality: Right;  Right Lumbar four-five extraforaminal microdiskectomy  . MOHS SURGERY     "nose"  . SHOULDER ARTHROSCOPY Right 2000   ACROMIOPLASTY  . TONSILLECTOMY      OB History    No data available       Home Medications    Prior to Admission medications   Medication Sig Start Date End Date Taking? Authorizing Provider  albuterol (PROVENTIL) (2.5 MG/3ML) 0.083% nebulizer solution Take 3 mLs (2.5 mg total) by nebulization every 4 (four) hours as needed for wheezing or shortness of breath. 06/30/16  Yes Shanker Kristeen Mans, MD  ALPRAZolam Duanne Moron) 0.25 MG tablet Take 1 tablet (0.25 mg total) by mouth 3 (three) times daily as needed for anxiety. 06/30/16  Yes Shanker Kristeen Mans, MD  apixaban (ELIQUIS) 5 MG TABS tablet Take 1 tablet (5 mg total) by mouth 2 (two) times daily. 07/28/15  Yes Daniel J Angiulli, PA-C  B Complex-C (B-COMPLEX WITH VITAMIN C) tablet Take 1 tablet by mouth daily.   Yes Historical Provider, MD  DULERA 200-5 MCG/ACT AERO Inhale 2 puffs into the lungs 2 (two) times daily.  03/14/16  Yes Historical Provider, MD  furosemide (LASIX) 20 MG tablet Take 1 tablet (20 mg total) by mouth daily. 06/30/16  Yes Shanker Kristeen Mans, MD  potassium chloride (K-DUR) 10 MEQ tablet Take 1 tablet (10 mEq total) by mouth daily. 06/30/16  Yes Shanker Kristeen Mans, MD  PROAIR HFA 108 [phone] Base) MCG/ACT inhaler Inhale 2 puffs into the lungs every 4 (four) hours as needed for  wheezing or shortness of breath. 06/30/16  Yes Shanker Kristeen Mans, MD  sertraline (ZOLOFT) 50 MG tablet Take 1 tablet (50 mg total) by mouth daily. 07/28/15  Yes Daniel J Angiulli, PA-C  tiotropium (SPIRIVA) 18 MCG inhalation capsule Place 1 capsule (18 mcg total) into inhaler and inhale daily. 06/30/16  Yes Shanker Kristeen Mans, MD  cephALEXin (KEFLEX) 250 MG capsule Take 1 capsule (250 mg total) by mouth 4 (four) times daily. 07/05/16   Junius Creamer, NP    Family History Family History  Problem Relation Age of Onset  . Stroke Sister   . Heart attack Neg Hx   . Hypertension Neg Hx     Social History Social History  Substance Use Topics  . Smoking status: Never Smoker  . Smokeless tobacco: Never Used  . Alcohol use 0.0 oz/week     Comment: 06/28/2016 "  moved to assisted living 06/18/2016; nothing since then"     Allergies   Tape   Review of Systems Review of Systems  Constitutional: Negative for fever.  Respiratory: Negative.   Cardiovascular: Negative.   Gastrointestinal: Negative.   Musculoskeletal: Positive for arthralgias. Negative for neck pain and neck stiffness.  Skin: Positive for wound.  Neurological: Positive for headaches. Negative for dizziness.  All other systems reviewed and are negative.    Physical Exam Updated Vital Signs BP 123/85 (BP Location: Right Arm)   Pulse 78   Temp 97.8 F (36.6 C) (Oral)   Resp 14   Ht 5\' 5"  (1.651 m)   Wt 60.3 kg   SpO2 92%   BMI 22.13 kg/m   Physical Exam  Constitutional: She appears well-developed and well-nourished.  HENT:  Head: Normocephalic.    Eyes: Pupils are equal, round, and reactive to light.    Neck: Normal range of motion.  Cardiovascular: Normal rate.   Pulmonary/Chest: Effort normal.  Abdominal: Soft.  Musculoskeletal: Normal range of motion.  Neurological: She is alert.  Skin: Skin is warm and dry.  Psychiatric: She has a normal mood and affect.  Nursing note and vitals reviewed.    ED  Treatments / Results  Labs (all labs ordered are listed, but only abnormal results are displayed) Labs Reviewed - No data to display  EKG  EKG Interpretation None       Radiology Ct Head Wo Contrast  Result Date: 07/05/2016 CLINICAL DATA:  Patient tripped and fell face forward onto concrete with laceration. Left thigh check closed. EXAM: CT HEAD WITHOUT CONTRAST TECHNIQUE: Contiguous axial images were obtained from the base of the skull through the vertex without intravenous contrast. COMPARISON:  None. FINDINGS: BRAIN: There is sulcal and ventricular prominence consistent with superficial and central atrophy. No intraparenchymal hemorrhage, mass effect nor midline shift. Periventricular and subcortical white matter hypodensities consistent with chronic small vessel ischemic disease are identified. No acute large vascular territory infarcts. No abnormal extra-axial fluid collections. Basal cisterns are not effaced and midline. VASCULAR: Moderate calcific atherosclerosis of the carotid siphons. SKULL: No skull fracture. No significant scalp soft tissue swelling. SINUSES/ORBITS: There is an acute left orbital floor fracture with herniation of the inferior rectus muscle and retrobulbar fat. The orbital floors displaced 9 mm with a gap of approximately 14 mm transverse. No medial wall fracture. No zygomatic arch fracture. Air-fluid level in the left maxillary sinus with blood products noted within. All The mastoid air-cells are clear. Bilateral lens replacements surgeries. OTHER: None. IMPRESSION: Acute left orbital floor fracture with displacement of the orbital floor approximately 9 mm with herniation of retrobulbar fat and inferior rectus muscle. No associated air-fluid level in the left maxillary sinus. Cerebral atrophy with chronic moderate small vessel ischemic disease. No acute intracranial abnormality. Electronically Signed   By: Ashley Royalty M.D.   On: 07/05/2016 21:29    Procedures Procedures  (including critical care time)  Medications Ordered in ED Medications  cephALEXin (KEFLEX) capsule 250 mg (not administered)     Initial Impression / Assessment and Plan / ED Course  I have reviewed the triage vital signs and the nursing notes.  Pertinent labs & imaging results that were available during my care of the patient were reviewed by me and considered in my medical decision making (see chart for details).  Clinical Course   Small area at the corner of the eye open and oozing due to the patient's being on Eliquis and fall  causing this much facial damage.  I will obtain a head CT  CT and shows that she has a orbital floor fracture with 9 mm displacement and 14 mm With muscle entrapment. I spoke with Dr. Alanda Slim from ophthalmology who will come examine the patient Spoke with Dr. Erik Obey .  Would like to see the patient next week for evaluation  Final Clinical Impressions(s) / ED Diagnoses   Final diagnoses:  Fall, initial encounter  Facial laceration, subsequent encounter  Orbital floor (blow-out) closed fracture (HCC)    New Prescriptions New Prescriptions   CEPHALEXIN (KEFLEX) 250 MG CAPSULE    Take 1 capsule (250 mg total) by mouth 4 (four) times daily.     Junius Creamer, NP 07/05/16 [place], MD 07/06/16 [id number]

## 2016-07-05 NOTE — Discharge Instructions (Signed)
Your CT scan shows that you have a fracture in the orbit. Your eye was examined by Dr. Alanda Slim  your case was discussed with Dr. Erik Obey ENT specialist, he would like to see you next week to discuss surgical repair

## 2016-07-05 NOTE — ED Notes (Signed)
Patient transported to CT 

## 2016-07-05 NOTE — ED Notes (Signed)
ED Provider at bedside. 

## 2016-07-05 NOTE — ED Triage Notes (Signed)
Pt arrives by [facility] from [facility] c/o a fall that occurred at her doctors office today. She tripped and hit her head on the curb. No LOC reported. Pt was then seen by her doctor and stitches were applied. Pt is on Eliquis. Pt returned home and noticed increased bleeding and called EMS. Bleeding controlled at this time. Swelling and bruising noted on left eye. VSS NAD at this time. Pt A&Ox4

## 2016-07-05 NOTE — Consult Note (Signed)
Chief Complaint  Patient presents with  . Fall  :       Ophthalmology HPI: This is a 83 y.o.  female with a past ocular history listed below that presents with left periorbital pain. She tripped on a curb on her way to her physician's office, causing lacerations to the left for head and left side of the face.  These were sutured by her physician.  Patient was discharged home on arrival.  Daughter noticed that there was some bleeding from the suture sites.  She is on Eliquis.  EMS was called and transported the patient for further evaluation.  He states that she took an extra take Tylenol for pain.     She subsequently returned to the ED for persistent bleeding from the suture sites. A CT scan revealed left orbital floor fracture.   She had temporary diplopia following the follow, but at time of interview, diplopia has resolved. She denies blurry vision, flashes of lgiht, floaters, loss of vision or curtains coming over vision.      Past Ocular History:  Ectropion OS Dry Eye Syndrome Pseudophakia OU    Last Eye Exam:  1 year, [facility]    Primary Eye Care:  [facility], Melford Aase   Past Medical History:  Diagnosis Date  . Allergic rhinitis   . Anxiety   . Aortic stenosis    EF normal.  Mild aortic stenosis. Mild to moderate aortic regurgitation  . Arthritis    "neck, spine, hands" (06/28/2016)  . Asthma 2000   mild. tried Advair after bronchospasm after exposure to cats  . CHF (congestive heart failure) ([place]) dx'd 06/28/2016  . COPD (chronic obstructive pulmonary disease) ([place])   . GERD (gastroesophageal reflux disease)    occ  . Intracranial hemorrhage following injury ([place]) 01/03/2015  . Paroxysmal atrial fibrillation (HCC)    pt does not want anticoagulation  . Pneumonia 07/2015  . Scoliosis   . Shortness of breath    occ  . Shoulder pain, left 05/2010   with rotator cuff tear--Dr Alfonso Ramus  . Skin cancer of nose    "MOHS;  think it was basal"  . Squamous carcinoma    "burned off my face"  . Stroke [facility]) 1/ 2014; 12/2014   denies residual on 06/28/2016     Past Surgical History:  Procedure Laterality Date  . BACK SURGERY    . CATARACT EXTRACTION W/ INTRAOCULAR LENS  IMPLANT, BILATERAL Bilateral   . DILATION AND CURETTAGE OF UTERUS    . HERNIA REPAIR  2003  . LUMBAR LAMINECTOMY/DECOMPRESSION MICRODISCECTOMY  04/01/2012   Procedure: LUMBAR LAMINECTOMY/DECOMPRESSION MICRODISCECTOMY 1 LEVEL;  Surgeon: Eustace Moore, MD;  Location: [facility];  Service: Neurosurgery;  Laterality: Right;  Right Lumbar four-five extraforaminal microdiskectomy  . MOHS SURGERY     "nose"  . SHOULDER ARTHROSCOPY Right 2000   ACROMIOPLASTY  . TONSILLECTOMY       Social History   Social History  . Marital status: Widowed    Spouse name: Herbie [place]  . Number of children: 2  . Years of education: 12th   Occupational History  . RETIRED Retired   Social History Main Topics  . Smoking status: Never Smoker  . Smokeless tobacco: Never Used  . Alcohol use 0.0 oz/week     Comment: 06/28/2016 "moved to assisted living 06/18/2016; nothing since then"  . Drug use: No  . Sexual activity: No   Other Topics Concern  . Not on file   Social  History Narrative   Patient is married with 2 children.   Patient is right handed.   Patient has hs education.   Patient drinks 1 cup daily.     Allergies  Allergen Reactions  . Tape Other (See Comments)    Rips skin     No current facility-administered medications on file prior to encounter.    Current Outpatient Prescriptions on File Prior to Encounter  Medication Sig Dispense Refill  . albuterol (PROVENTIL) (2.5 MG/3ML) 0.083% nebulizer solution Take 3 mLs (2.5 mg total) by nebulization every 4 (four) hours as needed for wheezing or shortness of breath. 75 mL 0  . ALPRAZolam (XANAX) 0.25 MG tablet Take 1 tablet (0.25 mg total) by mouth 3 (three) times daily as needed for anxiety.  20 tablet 0  . apixaban (ELIQUIS) 5 MG TABS tablet Take 1 tablet (5 mg total) by mouth 2 (two) times daily. 180 tablet 3  . B Complex-C (B-COMPLEX WITH VITAMIN C) tablet Take 1 tablet by mouth daily.    . DULERA 200-5 MCG/ACT AERO Inhale 2 puffs into the lungs 2 (two) times daily.     . furosemide (LASIX) 20 MG tablet Take 1 tablet (20 mg total) by mouth daily. 30 tablet 0  . potassium chloride (K-DUR) 10 MEQ tablet Take 1 tablet (10 mEq total) by mouth daily. 30 tablet 0  . PROAIR HFA 108 (90 Base) MCG/ACT inhaler Inhale 2 puffs into the lungs every 4 (four) hours as needed for wheezing or shortness of breath. 18 g 0  . sertraline (ZOLOFT) 50 MG tablet Take 1 tablet (50 mg total) by mouth daily. 30 tablet 4  . tiotropium (SPIRIVA) 18 MCG inhalation capsule Place 1 capsule (18 mcg total) into inhaler and inhale daily. 30 capsule 0     ROS    Exam:  General: Awake, Alert, Oriented *3  Vision (near):   OD: cc: 5 point OD,   OS: Maiden: (glasses broken OS) 14 point  Confrontational Field:   Full to count fingers, both eyes  Extraocular Motility:  Full ductions and versions, both eyes  [place]:   Orthophoric without signficant vertical .   External:   Left brow laceration and left temporal laceration. Left periorbital ecchymosis and edema  , V1-3 intact,   Decreased sensation over infraoribtal nerve distribution left side  Hertel:   16/16 (103)  Pupils  OD: 72mm to 51mm reactive without afferent pupillary defect (APD)  OS: 40mm to 67mm reactive without afferent pupillary defect (APD)   IOP(tonopen) (difficut assesment  OD: 19   OS: 18  Slit Lamp Exam:  Lids/Lashes  OD: dermatochalasis  OS: Perirobital edema, spastic lower lid ectropion  Conjucntiva/Sclera  OD: White and quiet  OS: White and quiet  Cornea  OD: Clear without abrasion or defect  OS: Clear without abrasion or defect  Anterior Chamber  OD: Deep and quiet,  OS: Deep and quiet, no gross  hyphema  Iris  OD: Normal iris architecture  OS: Normal Iris Architecture   Lens  OD: PCIOL   OS: PCIOL   Anterior Vitreous  OD: Clear, without cell  OS: Clear without cell   POSTERIOR POLE EXAM (Dialated with phenylephrine and tropicamide.Dilation may last up to 24 hours)  View:   OD: 20/20 view without opacities  OS: 20/20 view without opacities  Vitreous:   OD: Clear, no cell  OS: Clear, no cell  Disc:   OD: flat, sharp margin, with appropriate color  OS: flat, sharp margin,  with appropriate color  C:D Ratio:   OD: 0.4  OS: 0.4  Macula  OD: Mild RPE changes  OS: Mild RPE changes  Vessels  OD: Normal vasculature  OS: Normal vasculature  Periphery  OD: pigmentary changes  OS: pigmentary changes     Assessment and Plan:   This is 83 y.o.  female with past ocular history of pseudophakia, dry eyes, left lower lid ectropion that presents with left orbital floor fracture.   Facial Lacerations - Repaired by ED, defer management to primary team.   Orbital Floor Fracture - No significant enophthalmos or diplopia.  - No primary eye injury seen on exam today - Recommend prophylaxis antibiotics ( Keflex 500 BID x 5 days or equivalent)  - No nose blowing - Follow up as outpatient ophthalmology in 1 week, with Dr. Melford Aase or myself.    Pseudophakia - Stable, continue to follow. Needs new Rx given broken glasses in fall, will defer to outpatient management  Dry Eyes - Continue artificial tears as needed for itching/discomfort  Left lower lid ectropion - Longstanding. No significant concern in setting of periorbital edema. Will defer to outpatient management.    Julian Reil, M.D.  [facility] [address] [place],  [phone] ([phone]

## 2016-07-05 NOTE — ED Notes (Signed)
Pt returned from CT and connected to the pulse ox and BP

## 2016-07-06 ENCOUNTER — Emergency Department (HOSPITAL_COMMUNITY): Payer: Medicare Other

## 2016-07-06 ENCOUNTER — Observation Stay (HOSPITAL_COMMUNITY)
Admission: EM | Admit: 2016-07-06 | Discharge: 2016-07-06 | Disposition: A | Discharge status: Home Health | Payer: Medicare Other | Attending: Emergency Medicine | Admitting: Emergency Medicine

## 2016-07-06 ENCOUNTER — Encounter (HOSPITAL_COMMUNITY): Payer: Self-pay

## 2016-07-06 DIAGNOSIS — S060X9A Concussion with loss of consciousness of unspecified duration, initial encounter: Secondary | ICD-10-CM | POA: Diagnosis present

## 2016-07-06 DIAGNOSIS — Z7901 Long term (current) use of anticoagulants: Secondary | ICD-10-CM | POA: Insufficient documentation

## 2016-07-06 DIAGNOSIS — H578 Other specified disorders of eye and adnexa: Secondary | ICD-10-CM | POA: Diagnosis not present

## 2016-07-06 DIAGNOSIS — Z8673 Personal history of transient ischemic attack (TIA), and cerebral infarction without residual deficits: Secondary | ICD-10-CM | POA: Insufficient documentation

## 2016-07-06 DIAGNOSIS — S065X0A Traumatic subdural hemorrhage without loss of consciousness, initial encounter: Secondary | ICD-10-CM | POA: Diagnosis not present

## 2016-07-06 DIAGNOSIS — M791 Myalgia: Secondary | ICD-10-CM | POA: Diagnosis not present

## 2016-07-06 DIAGNOSIS — R531 Weakness: Principal | ICD-10-CM | POA: Insufficient documentation

## 2016-07-06 DIAGNOSIS — I272 Pulmonary hypertension, unspecified: Secondary | ICD-10-CM | POA: Insufficient documentation

## 2016-07-06 DIAGNOSIS — I5032 Chronic diastolic (congestive) heart failure: Secondary | ICD-10-CM | POA: Insufficient documentation

## 2016-07-06 DIAGNOSIS — R262 Difficulty in walking, not elsewhere classified: Secondary | ICD-10-CM | POA: Diagnosis not present

## 2016-07-06 DIAGNOSIS — I480 Paroxysmal atrial fibrillation: Secondary | ICD-10-CM | POA: Insufficient documentation

## 2016-07-06 DIAGNOSIS — W101XXA Fall (on)(from) sidewalk curb, initial encounter: Secondary | ICD-10-CM | POA: Insufficient documentation

## 2016-07-06 DIAGNOSIS — Z85828 Personal history of other malignant neoplasm of skin: Secondary | ICD-10-CM | POA: Diagnosis not present

## 2016-07-06 DIAGNOSIS — F419 Anxiety disorder, unspecified: Secondary | ICD-10-CM | POA: Insufficient documentation

## 2016-07-06 DIAGNOSIS — Z79899 Other long term (current) drug therapy: Secondary | ICD-10-CM | POA: Insufficient documentation

## 2016-07-06 DIAGNOSIS — M7918 Myalgia, other site: Secondary | ICD-10-CM

## 2016-07-06 DIAGNOSIS — I482 Chronic atrial fibrillation: Secondary | ICD-10-CM | POA: Diagnosis not present

## 2016-07-06 DIAGNOSIS — R05 Cough: Secondary | ICD-10-CM | POA: Diagnosis not present

## 2016-07-06 DIAGNOSIS — S199XXA Unspecified injury of neck, initial encounter: Secondary | ICD-10-CM | POA: Diagnosis not present

## 2016-07-06 DIAGNOSIS — S6292XA Unspecified fracture of left wrist and hand, initial encounter for closed fracture: Secondary | ICD-10-CM | POA: Diagnosis not present

## 2016-07-06 DIAGNOSIS — M542 Cervicalgia: Secondary | ICD-10-CM | POA: Diagnosis not present

## 2016-07-06 DIAGNOSIS — W19XXXD Unspecified fall, subsequent encounter: Secondary | ICD-10-CM

## 2016-07-06 DIAGNOSIS — W19XXXA Unspecified fall, initial encounter: Secondary | ICD-10-CM

## 2016-07-06 DIAGNOSIS — S0990XA Unspecified injury of head, initial encounter: Secondary | ICD-10-CM | POA: Diagnosis not present

## 2016-07-06 DIAGNOSIS — J449 Chronic obstructive pulmonary disease, unspecified: Secondary | ICD-10-CM | POA: Diagnosis not present

## 2016-07-06 DIAGNOSIS — S060XAA Concussion with loss of consciousness status unknown, initial encounter: Secondary | ICD-10-CM | POA: Diagnosis present

## 2016-07-06 DIAGNOSIS — S0232XA Fracture of orbital floor, left side, initial encounter for closed fracture: Secondary | ICD-10-CM | POA: Diagnosis not present

## 2016-07-06 LAB — URINALYSIS, ROUTINE W REFLEX MICROSCOPIC
BACTERIA UA: NONE SEEN
BILIRUBIN URINE: NEGATIVE
Glucose, UA: NEGATIVE mg/dL
HGB URINE DIPSTICK: NEGATIVE
KETONES UR: NEGATIVE mg/dL
NITRITE: NEGATIVE
Protein, ur: 30 mg/dL — AB
Specific Gravity, Urine: 1.02 (ref 1.005–1.030)
pH: 6 (ref 5.0–8.0)

## 2016-07-06 LAB — COMPREHENSIVE METABOLIC PANEL
ALBUMIN: 3.1 g/dL — AB (ref 3.5–5.0)
ALT: 21 U/L (ref 14–54)
AST: 27 U/L (ref 15–41)
Alkaline Phosphatase: 100 U/L (ref 38–126)
Anion gap: 10 (ref 5–15)
BUN: 17 mg/dL (ref 6–20)
CHLORIDE: 104 mmol/L (ref 101–111)
CO2: 24 mmol/L (ref 22–32)
Calcium: 8.4 mg/dL — ABNORMAL LOW (ref 8.9–10.3)
Creatinine, Ser: 0.87 mg/dL (ref 0.44–1.00)
GFR calc Af Amer: >60 mL/min (ref >60)
GFR calc non Af Amer: 57 mL/min — ABNORMAL LOW (ref >60)
GLUCOSE: 97 mg/dL (ref 65–99)
POTASSIUM: 4.4 mmol/L (ref 3.5–5.1)
Sodium: 138 mmol/L (ref 135–145)
Total Bilirubin: 1.6 mg/dL — ABNORMAL HIGH (ref 0.3–1.2)
Total Protein: 5.9 g/dL — ABNORMAL LOW (ref 6.5–8.1)

## 2016-07-06 LAB — CBC WITH DIFFERENTIAL/PLATELET
BASOS PCT: 1 %
Basophils Absolute: 0 10*3/uL (ref 0.0–0.1)
EOS PCT: 3 %
Eosinophils Absolute: 0.2 10*3/uL (ref 0.0–0.7)
HCT: 45.1 % (ref 36.0–46.0)
Hemoglobin: 15 g/dL (ref 12.0–15.0)
Lymphocytes Relative: 9 %
Lymphs Abs: 0.8 10*3/uL (ref 0.7–4.0)
MCH: 32.9 pg (ref 26.0–34.0)
MCHC: 33.3 g/dL (ref 30.0–36.0)
MCV: 98.9 fL (ref 78.0–100.0)
Monocytes Absolute: 1.2 10*3/uL — ABNORMAL HIGH (ref 0.1–1.0)
Monocytes Relative: 14 %
NEUTROS ABS: 6.3 10*3/uL (ref 1.7–7.7)
Neutrophils Relative %: 73 %
PLATELETS: 225 10*3/uL (ref 150–400)
RBC: 4.56 MIL/uL (ref 3.87–5.11)
RDW: 14.7 % (ref 11.5–15.5)
WBC: 8.5 10*3/uL (ref 4.0–10.5)

## 2016-07-06 LAB — PROTIME-INR
INR: 1.07
Prothrombin Time: 13.9 seconds (ref 11.4–15.2)

## 2016-07-06 NOTE — ED Provider Notes (Cosign Needed)
Physical Exam  BP 137/98 (BP Location: Right Arm)   Pulse 78   Temp 98.1 F (36.7 C)   Resp 18   SpO2 95%   Physical Exam  ED Course  Procedures  MDM 3:13 PM- Sign out from Delos Haring, PA-C  Per previous provider HPI: Patient brought to the ER by EMS which was called by the patient's daughter. Patient lost her husband on 06/01/2016 and since then the agent [facility], with only 2 weeks left to stay there. The daughter reports bringing her to the emergency department with concern of weakness and wanting further evaluation after visit last night.  Yesterday the patient was going to the primary care doctor visit around 3 PM when she sustained a fall on the curb hitting the left side of her head, her primary care doctor x-rayed her left hand showed 2 fractures and sutured up her left eyebrow with a total of 8 sutures advised her to take Tylenol and sent her home.  Patient does have a past medical history of congestive heart failure and atrial fibrillation for which she is on Eloquis. Around 5 PM on the patient was home she felt dizzy, sutures were using and she was having pain therefore her daughter brought her to the emergency department. At [facility] ER a CT of the head was done and a orbital blowout fracture was noted, the ophthalmologist came to the ER to evaluate the patient fell she was stable for discharge. She started on Keflex and advised to follow-up with Dr. Erik Obey the ENT provider. The daughter reports not being happy at discharge at that time. The patient went home and slept, the daughter reports that when she woke up to try to take her to the bathroom the patient was very weak and can only walk a few steps to the bathroom a significant assistance. Prior to the fall the patient was able to angulate on her own without a cane or any assisted device.  The daughter does not want the patient to go back to the [facility], this is a non medical  assisted living facility.  And the daughter voices being unable to take her mother back home with her since her mom cannot walk on her own and due to broken left hand   Pending Imaging due to fall. Consult placed to Social work for home health or placement to facility. If unable to do both,, will admit to medicine due to inability to ambulate and weakness.   5:04 PM- Discussed patient with Social work  5:05 PM- CT Head Impression: IMPRESSION: 1. No acute intracranial abnormality. 2. Stable moderate to marked generalized atrophy and severe chronic microvascular ischemic changes of the white matter. 3. Left frontal scalp hematoma without underlying skull fracture. 4. Left orbital floor fracture as identified yesterday, with possible left rectus muscle entrapment, a new finding. Please correlate with physical examination of the left eye extraocular muscles. 5. Air-fluid level in the left maxillary sinus likely related to the orbital floor fracture.  Discussed new finding with attending physician. Left EOM intact. No pain with EOM. No diplopia. Doubt entrapment based on clinical exam.    Will consult medicine for admission due to inability to ambulate well as well as high fall risk. Seen by previous supervising physician who agrees with admission. Likely will require physical therapy evaluation and concussion evaluation with placement to SNF.   6:27 PM-  Medicine discussed admission with patient and family declined after much thought.  Seen hospitalist note for further details. Pt wishes to go home with outpatient resources given from social work. Ambulated in ED without difficulty. Risks and benefits discussed.     Shary Decamp, PA-C 07/06/16 [phone]

## 2016-07-06 NOTE — Clinical Social Work Note (Signed)
RNCM notified to contact patient's dtr, [name] in regards to private duty caregiver list and [facility] services via [facility].  Glendon Axe, MSW [phone] 07/06/2016 4:24 PM

## 2016-07-06 NOTE — Discharge Planning (Signed)
[facility]  Patient Status: ACTIVE  [facility] is providing the following services: PT,OT,SN AND SPEECH  If patient discharges after hours, please call [phone].   Caroline Myers 07/06/2016, 4:48 PM

## 2016-07-06 NOTE — ED Provider Notes (Signed)
[facility] DEPT Provider Note   CSN: [id number] Arrival date & time: 07/06/16  1333  History   Chief Complaint Chief Complaint  Patient presents with  . Fall    HPI Caroline Myers is a 83 y.o. female.  HPI Patient brought to the ER by EMS which was called by the patient's daughter. Patient lost her husband on 06/01/2016 and since then the agent [facility], with only 2 weeks left to stay there. The daughter reports bringing her to the emergency department with concern of weakness and wanting further evaluation after visit last night.  Yesterday the patient was going to the primary care doctor visit around 3 PM when she sustained a fall on the curb hitting the left side of her head, her primary care doctor x-rayed her left hand showed 2 fractures and sutured up her left eyebrow with a total of 8 sutures advised her to take Tylenol and sent her home.  Patient does have a past medical history of congestive heart failure and atrial fibrillation for which she is on Eloquis. Around 5 PM on the patient was home she felt dizzy, sutures were using and she was having pain therefore her daughter brought her to the emergency department. At [facility] ER a CT of the head was done and a orbital blowout fracture was noted, the ophthalmologist came to the ER to evaluate the patient fell she was stable for discharge. She started on Keflex and advised to follow-up with Dr. Erik Obey the ENT provider. The daughter reports not being happy at discharge at that time. The patient went home and slept, the daughter reports that when she woke up to try to take her to the bathroom the patient was very weak and can only walk a few steps to the bathroom a significant assistance. Prior to the fall the patient was able to angulate on her own without a cane or any assisted device.  The daughter does not want the patient to go back to the [facility], this is a non medical assisted living  facility.  And the daughter voices being unable to take her mother back home with her since her mom cannot walk on her own and due to broken left hand  Past Medical History:  Diagnosis Date  . Allergic rhinitis   . Anxiety   . Aortic stenosis    EF normal.  Mild aortic stenosis. Mild to moderate aortic regurgitation  . Arthritis    "neck, spine, hands" (06/28/2016)  . Asthma 2000   mild. tried Advair after bronchospasm after exposure to cats  . CHF (congestive heart failure) ([place]) dx'd 06/28/2016  . COPD (chronic obstructive pulmonary disease) ([place])   . GERD (gastroesophageal reflux disease)    occ  . Intracranial hemorrhage following injury ([place]) 01/03/2015  . Paroxysmal atrial fibrillation (HCC)    pt does not want anticoagulation  . Pneumonia 07/2015  . Scoliosis   . Shortness of breath    occ  . Shoulder pain, left 05/2010   with rotator cuff tear--Dr Alfonso Ramus  . Skin cancer of nose    "MOHS; think it was basal"  . Squamous carcinoma    "burned off my face"  . Stroke [facility]) 1/ 2014; 12/2014   denies residual on 06/28/2016    Patient Active Problem List   Diagnosis Date Noted  . CHF exacerbation ([place]) 06/28/2016  . Respiratory failure with hypoxia ([place]) 06/28/2016  . URI (upper respiratory infection) 06/28/2016  . Chest  pressure 06/28/2016  . Oral dyskinesia 06/28/2016  . PAF (paroxysmal atrial fibrillation) ([place])   . Falls 05/20/2016  . Pulmonary hypertension 03/15/2016  . Anxiety about health   . Cough   . Hypokalemia   . AKI (acute kidney injury) ([place])   . Acute diastolic congestive heart failure ([place]) 07/20/2015  . Debility 07/20/2015  . Sepsis ([place]) 07/20/2015  . Acute on chronic diastolic congestive heart failure ([place])   . History of CVA (cerebrovascular accident)   . Tachypnea   . Leukocytosis 07/17/2015  . Arterial hypotension   . Hypotension 07/16/2015  . Elevated troponin 07/16/2015  . PNA (pneumonia)   . Severe sepsis ([place]) 07/15/2015  . Acute  respiratory failure with hypercapnia ([place]) 07/15/2015  . Chronic anticoagulation 04/18/2015  . Orthostatic hypotension 03/09/2015  . CVA (cerebral infarction) 02/14/2015  . Stroke ([place]) 02/14/2015  . Chronic atrial fibrillation ([place]) 02/14/2015  . Stroke with cerebral ischemia ([place])   . Cerebral infarction due to embolism of left middle cerebral artery ([place])   . Occipital fracture ([place])   . Benign essential HTN   . Other allergic rhinitis   . Depression   . SAH (subarachnoid hemorrhage) ([place]) 01/02/2015  . SDH (subdural hematoma) ([place]) 01/02/2015  . Aortic atherosclerosis ([place]) 08/05/2014  . Nonspecific abnormal unspecified cardiovascular function study 10/28/2013  . Aortic valve disorder 10/28/2013  . HLD (hyperlipidemia) 07/28/2013  . TIA (transient ischemic attack) 07/27/2013  . History of CHF (congestive heart failure) 07/27/2013  . CVA (cerebral vascular accident) ([place]) 07/27/2013  . Urticaria 11/06/2010  . Bronchitis 10/12/2010  . Preventative health care   . Atrial fibrillation ([place]) 04/04/2010  . Anxiety state 03/29/2010  . ATRIAL FLUTTER 03/29/2010  . RIB PAIN, LEFT SIDED 10/12/2009  . ALLERGIC RHINITIS 06/28/2009  . SHOULDER PAIN, LEFT 06/28/2009  . SCIATICA, RIGHT 06/28/2009  . HEADACHE 06/28/2009  . DYSPNEA ON EXERTION 06/28/2009  . SKIN CANCER, HX OF 06/28/2009    Past Surgical History:  Procedure Laterality Date  . BACK SURGERY    . CATARACT EXTRACTION W/ INTRAOCULAR LENS  IMPLANT, BILATERAL Bilateral   . DILATION AND CURETTAGE OF UTERUS    . HERNIA REPAIR  2003  . LUMBAR LAMINECTOMY/DECOMPRESSION MICRODISCECTOMY  04/01/2012   Procedure: LUMBAR LAMINECTOMY/DECOMPRESSION MICRODISCECTOMY 1 LEVEL;  Surgeon: Eustace Moore, MD;  Location: [facility];  Service: Neurosurgery;  Laterality: Right;  Right Lumbar four-five extraforaminal microdiskectomy  . MOHS SURGERY     "nose"  . SHOULDER ARTHROSCOPY Right 2000   ACROMIOPLASTY  . TONSILLECTOMY      OB History     No data available       Home Medications    Prior to Admission medications   Medication Sig Start Date End Date Taking? Authorizing Provider  albuterol (PROVENTIL) (2.5 MG/3ML) 0.083% nebulizer solution Take 3 mLs (2.5 mg total) by nebulization every 4 (four) hours as needed for wheezing or shortness of breath. 06/30/16   Shanker Kristeen Mans, MD  ALPRAZolam Duanne Moron) 0.25 MG tablet Take 1 tablet (0.25 mg total) by mouth 3 (three) times daily as needed for anxiety. 06/30/16   Shanker Kristeen Mans, MD  apixaban (ELIQUIS) 5 MG TABS tablet Take 1 tablet (5 mg total) by mouth 2 (two) times daily. 07/28/15   Daniel J Angiulli, PA-C  B Complex-C (B-COMPLEX WITH VITAMIN C) tablet Take 1 tablet by mouth daily.    Historical Provider, MD  cephALEXin (KEFLEX) 250 MG capsule Take 1 capsule (250 mg total) by mouth 4 (four)  times daily. 07/05/16   Junius Creamer, NP  DULERA 200-5 MCG/ACT AERO Inhale 2 puffs into the lungs 2 (two) times daily.  03/14/16   Historical Provider, MD  furosemide (LASIX) 20 MG tablet Take 1 tablet (20 mg total) by mouth daily. 06/30/16   Shanker Kristeen Mans, MD  potassium chloride (K-DUR) 10 MEQ tablet Take 1 tablet (10 mEq total) by mouth daily. 06/30/16   Shanker Kristeen Mans, MD  PROAIR HFA 108 [phone] Base) MCG/ACT inhaler Inhale 2 puffs into the lungs every 4 (four) hours as needed for wheezing or shortness of breath. 06/30/16   Shanker Kristeen Mans, MD  sertraline (ZOLOFT) 50 MG tablet Take 1 tablet (50 mg total) by mouth daily. 07/28/15   Lavon Paganini Angiulli, PA-C  tiotropium (SPIRIVA) 18 MCG inhalation capsule Place 1 capsule (18 mcg total) into inhaler and inhale daily. 06/30/16   Shanker Kristeen Mans, MD    Family History Family History  Problem Relation Age of Onset  . Stroke Sister   . Heart attack Neg Hx   . Hypertension Neg Hx     Social History Social History  Substance Use Topics  . Smoking status: Never Smoker  . Smokeless tobacco: Never Used  . Alcohol use 0.0 oz/week      Comment: 06/28/2016 "moved to assisted living 06/18/2016; nothing since then"     Allergies   Tape   Review of Systems Review of Systems Review of Systems All other systems negative except as documented in the HPI. All pertinent positives and negatives as reviewed in the HPI.   Physical Exam Updated Vital Signs BP 132/67 (BP Location: Right Arm)   Pulse 75   Temp 98.1 F (36.7 C)   Resp 16   SpO2 97%   Physical Exam  Constitutional: She is oriented to person, place, and time. She appears well-developed and well-nourished. No distress.  HENT:  Head: Normocephalic and atraumatic. Head is with left periorbital erythema (swollen shut with intact sutures and significant edema).  Eyes: Pupils are equal, round, and reactive to light.  Neck: Normal range of motion. Neck supple. No spinous process tenderness and no muscular tenderness present.  Cardiovascular: Normal rate and regular rhythm.   Pulmonary/Chest: Effort normal.  Abdominal: Soft.  Musculoskeletal:  Pt generally weak, no focal weakness noted. Splint applied to left hand.  Neurological: She is alert and oriented to person, place, and time.  Skin: Skin is warm and dry.  Nursing note and vitals reviewed.  ED Treatments / Results  Labs (all labs ordered are listed, but only abnormal results are displayed) Labs Reviewed  CBC WITH DIFFERENTIAL/PLATELET - Abnormal; Notable for the following:       Result Value   Monocytes Absolute 1.2 (*)    All other components within normal limits  COMPREHENSIVE METABOLIC PANEL  PROTIME-INR  URINALYSIS, ROUTINE W REFLEX MICROSCOPIC    EKG  EKG Interpretation None       Radiology  Procedures Procedures (including critical care time)  Medications Ordered in ED Medications - No data to display   Initial Impression / Assessment and Plan / ED Course  I have reviewed the triage vital signs and the nursing notes.  Pertinent labs & imaging results that were available during  my care of the patient were reviewed by me and considered in my medical decision making (see chart for details).  Clinical Course    I have initiated work-up for weakness as well as put a page out for social work. If  not source of infection can be found the patient may need to be admitted for weakness and being unable to ambulate and care for herself at home.  End of shift sign out to Shary Decamp, PA-C. CT head and cervical spine pending, Chest xray pending. Either social work will have to advise on placement or help with home health for [facility] or the daughters home (patient is staying with her daughter just after the fall yesterday) or the patient will require admission for obs/placement.   Final Clinical Impressions(s) / ED Diagnoses   Final diagnoses:  None    New Prescriptions New Prescriptions   No medications on file      Delos Haring, Hershal Coria 07/06/16 [place], MD 07/06/16 [phone]

## 2016-07-06 NOTE — ED Notes (Signed)
Patient transported to X-ray 

## 2016-07-06 NOTE — Discharge Instructions (Signed)
Please read and follow all provided instructions.  Your diagnoses today include:  1. Fall, initial encounter     Tests performed today include: Vital signs. See below for your results today.   Medications prescribed:  Take as prescribed   Home care instructions:  Follow any educational materials contained in this packet.  Follow-up instructions: Please follow-up with your primary care provider for further evaluation of symptoms and treatment   Return instructions:  Please return to the Emergency Department if you do not get better, if you get worse, or new symptoms OR  - Fever (temperature greater than 101.90F)  - Bleeding that does not stop with holding pressure to the area    -Severe pain (please note that you may be more sore the day after your accident)  - Chest Pain  - Difficulty breathing  - Severe nausea or vomiting  - Inability to tolerate food and liquids  - Passing out  - Skin becoming red around your wounds  - Change in mental status (confusion or lethargy)  - New numbness or weakness    Please return if you have any other emergent concerns.  Additional Information:  Your vital signs today were: BP 130/88    Pulse 77    Temp 98.1 F (36.7 C)    Resp 20    SpO2 97%  If your blood pressure (BP) was elevated above 135/85 this visit, please have this repeated by your doctor within one month. ---------------

## 2016-07-06 NOTE — Care Management Note (Signed)
Case Management Note  Patient Details  Name: Caroline Myers MRN: [id number] Date of Birth: 1923/12/16  Subjective/Objective:   83 y.o. F seen in the ED after a fall due to weakness. Lives at [facility] where she receives [facility], PT, OT and Speech Therapy provided by [facility] [place]. Daughter, at bedside asked for list of private Duty caregivers, which were provided her to assist her M at [facility] when she cannot. Made [facility] representative aware of pts return to [facility] with continued service.                  Action/Plan: Anticipate return to ALF today with resumption of [facility] services. No additional CM needs at this time.    Expected Discharge Date:                  Expected Discharge Plan:  Assisted Living / Rest Home  In-House Referral:  Clinical Social Work  Discharge planning Services  CM Consult  Post Acute Care Choice:  NA Choice offered to:  Patient, Adult Children  DME Arranged:  N/A DME Agency:  NA  HH Arranged:  PT, OT, RN, Speech Therapy HH Agency:  [facility] (Prior to EDV)  Status of Service:  Completed, signed off  If discussed at [place] of Stay Meetings, dates discussed:    Additional Comments:  Delrae Sawyers, RN 07/06/2016, 4:56 PM

## 2016-07-06 NOTE — ED Triage Notes (Signed)
Pt arrives EMS with c/o fall at MD office yesterday. Seen at MD office and had hand splinted yesterday then seen here last night.

## 2016-07-06 NOTE — Progress Notes (Addendum)
  PROGRESS NOTE   Caroline Myers  [id number] DOB: Jun 29, 1924 DOA: 07/06/2016 PCP: Melinda Crutch, MD   Patient seen and daughter at bedside. We had an extensive conversation regarding her care, needs. Patient had a mechanical fall after tripping on the curb on 1/5 and sustained left hand fracture and left orbital fracture. She was evaluated in ED on 1/5 and discharged home but due to continued weakness, balance issues, she came back to the hospital. PT/OT/SLP evaluation and possible SNF placement depending on her evaluation was discussed, but patient and daughter declined. After discussion, they have elected to go home with family and set up home health services.    Dessa Phi, DO [facility] [email or web address] Password [email or web address] 07/06/2016, 6:20 PM

## 2016-07-06 NOTE — Clinical Social Work Note (Signed)
Patient had fall at medical appointment on Friday, 1/5. Patient came into [facility] ED yesterday however patient returned home with dtr, [name].   Patient is a LTC resident at [facility]. MSW has contacted facility in reference to patient's return. FL-2 completed and faxed to facility.   Patient's dtr, [name] prepared to pay privately for caregivers as patient will need an increased level of care due to broken hand, broken bones and se[place] bruising around left eye.  MSW explained Medicare guidelines for SNF placement. MD present during meeting, it is possible that patient will be admitted.   Pt's dtr, [name] requesting to meet with RNCM in regards to [facility] services in the event patient returns to [facility] this evening.   Patient and dtr, [name] pleasant and appreciated social work intervention.   MSW remains available as needed.   Glendon Axe, MSW [phone] 07/06/2016 4:12 PM

## 2016-07-06 NOTE — ED Notes (Signed)
Patient transported to CT 

## 2016-07-06 NOTE — NC FL2 (Signed)
[place] LEVEL OF CARE SCREENING TOOL     IDENTIFICATION  Patient Name: Caroline Myers Birthdate: 03-06-1924 Sex: female Admission Date (Current Location): 07/06/2016  [facility] and [place] Number:  Herbalist and Address:  [facility]. [facility], [place] [address] [place], [place] [address]      Provider Number: [id number]  Attending Physician Name and Address:  Fatima Blank, MD  Relative Name and Phone Number:       Current Level of Care: Hospital Recommended Level of Care: [place] Prior Approval Number:    Date Approved/Denied:   PASRR Number:    Discharge Plan: Other (Comment) (ALF)    Current Diagnoses: Patient Active Problem List   Diagnosis Date Noted  . CHF exacerbation ([place]) 06/28/2016  . Respiratory failure with hypoxia ([place]) 06/28/2016  . URI (upper respiratory infection) 06/28/2016  . Chest pressure 06/28/2016  . Oral dyskinesia 06/28/2016  . PAF (paroxysmal atrial fibrillation) ([place])   . Falls 05/20/2016  . Pulmonary hypertension 03/15/2016  . Anxiety about health   . Cough   . Hypokalemia   . AKI (acute kidney injury) ([place])   . Acute diastolic congestive heart failure ([place]) 07/20/2015  . Debility 07/20/2015  . Sepsis ([place]) 07/20/2015  . Acute on chronic diastolic congestive heart failure ([place])   . History of CVA (cerebrovascular accident)   . Tachypnea   . Leukocytosis 07/17/2015  . Arterial hypotension   . Hypotension 07/16/2015  . Elevated troponin 07/16/2015  . PNA (pneumonia)   . Severe sepsis ([place]) 07/15/2015  . Acute respiratory failure with hypercapnia ([place]) 07/15/2015  . Chronic anticoagulation 04/18/2015  . Orthostatic hypotension 03/09/2015  . CVA (cerebral infarction) 02/14/2015  . Stroke ([place]) 02/14/2015  . Chronic atrial fibrillation ([place]) 02/14/2015  . Stroke with cerebral ischemia ([place])   . Cerebral infarction due to embolism of left middle cerebral artery ([place])    . Occipital fracture ([place])   . Benign essential HTN   . Other allergic rhinitis   . Depression   . SAH (subarachnoid hemorrhage) ([place]) 01/02/2015  . SDH (subdural hematoma) ([place]) 01/02/2015  . Aortic atherosclerosis ([place]) 08/05/2014  . Nonspecific abnormal unspecified cardiovascular function study 10/28/2013  . Aortic valve disorder 10/28/2013  . HLD (hyperlipidemia) 07/28/2013  . TIA (transient ischemic attack) 07/27/2013  . History of CHF (congestive heart failure) 07/27/2013  . CVA (cerebral vascular accident) ([place]) 07/27/2013  . Urticaria 11/06/2010  . Bronchitis 10/12/2010  . Preventative health care   . Atrial fibrillation ([place]) 04/04/2010  . Anxiety state 03/29/2010  . ATRIAL FLUTTER 03/29/2010  . RIB PAIN, LEFT SIDED 10/12/2009  . ALLERGIC RHINITIS 06/28/2009  . SHOULDER PAIN, LEFT 06/28/2009  . SCIATICA, RIGHT 06/28/2009  . HEADACHE 06/28/2009  . DYSPNEA ON EXERTION 06/28/2009  . SKIN CANCER, HX OF 06/28/2009    Orientation RESPIRATION BLADDER Height & Weight     Place, Situation, Time, Self  Normal Continent Weight:   Height:     BEHAVIORAL SYMPTOMS/MOOD NEUROLOGICAL BOWEL NUTRITION STATUS   (NONE)  (NONE ) Continent Diet (REGULAR )  AMBULATORY STATUS COMMUNICATION OF NEEDS Skin   Limited Assist Verbally Normal                       Personal Care Assistance Level of Assistance  Bathing, Dressing, Feeding Bathing Assistance: Maximum assistance Feeding assistance: Limited assistance Dressing Assistance: Maximum assistance     Functional Limitations Info  Speech, Hearing, Sight  Sight Info: Adequate Hearing Info: Adequate Speech Info: Adequate    SPECIAL CARE FACTORS FREQUENCY                       Contractures      Additional Factors Info  Code Status, Allergies Code Status Info: DNR  Allergies Info: TAPE            Current Medications (07/06/2016):  This is the current hospital active medication list No current  facility-administered medications for this encounter.    Current Outpatient Prescriptions  Medication Sig Dispense Refill  . albuterol (PROVENTIL) (2.5 MG/3ML) 0.083% nebulizer solution Take 3 mLs (2.5 mg total) by nebulization every 4 (four) hours as needed for wheezing or shortness of breath. 75 mL 0  . ALPRAZolam (XANAX) 0.25 MG tablet Take 1 tablet (0.25 mg total) by mouth 3 (three) times daily as needed for anxiety. 20 tablet 0  . apixaban (ELIQUIS) 5 MG TABS tablet Take 1 tablet (5 mg total) by mouth 2 (two) times daily. 180 tablet 3  . B Complex-C (B-COMPLEX WITH VITAMIN C) tablet Take 1 tablet by mouth daily.    . cephALEXin (KEFLEX) 250 MG capsule Take 1 capsule (250 mg total) by mouth 4 (four) times daily. 20 capsule 0  . DULERA 200-5 MCG/ACT AERO Inhale 2 puffs into the lungs 2 (two) times daily.     . furosemide (LASIX) 20 MG tablet Take 1 tablet (20 mg total) by mouth daily. 30 tablet 0  . potassium chloride (K-DUR) 10 MEQ tablet Take 1 tablet (10 mEq total) by mouth daily. 30 tablet 0  . PROAIR HFA 108 (90 Base) MCG/ACT inhaler Inhale 2 puffs into the lungs every 4 (four) hours as needed for wheezing or shortness of breath. 18 g 0  . sertraline (ZOLOFT) 50 MG tablet Take 1 tablet (50 mg total) by mouth daily. 30 tablet 4  . tiotropium (SPIRIVA) 18 MCG inhalation capsule Place 1 capsule (18 mcg total) into inhaler and inhale daily. 30 capsule 0     Discharge Medications: Please see discharge summary for a list of discharge medications.  Relevant Imaging Results:  Relevant Lab Results:   Additional Information SSN [id number]  Glendon Axe A

## 2016-07-08 DIAGNOSIS — M79642 Pain in left hand: Secondary | ICD-10-CM | POA: Diagnosis not present

## 2016-07-08 DIAGNOSIS — S62615A Displaced fracture of proximal phalanx of left ring finger, initial encounter for closed fracture: Secondary | ICD-10-CM | POA: Diagnosis not present

## 2016-07-09 DIAGNOSIS — I350 Nonrheumatic aortic (valve) stenosis: Secondary | ICD-10-CM | POA: Diagnosis not present

## 2016-07-09 DIAGNOSIS — S0232XA Fracture of orbital floor, left side, initial encounter for closed fracture: Secondary | ICD-10-CM | POA: Diagnosis not present

## 2016-07-09 DIAGNOSIS — R296 Repeated falls: Secondary | ICD-10-CM | POA: Diagnosis not present

## 2016-07-09 DIAGNOSIS — S01112A Laceration without foreign body of left eyelid and periocular area, initial encounter: Secondary | ICD-10-CM | POA: Diagnosis not present

## 2016-07-09 DIAGNOSIS — J9691 Respiratory failure, unspecified with hypoxia: Secondary | ICD-10-CM | POA: Diagnosis not present

## 2016-07-11 DIAGNOSIS — S62308D Unspecified fracture of other metacarpal bone, subsequent encounter for fracture with routine healing: Secondary | ICD-10-CM | POA: Diagnosis not present

## 2016-07-11 DIAGNOSIS — S62615D Displaced fracture of proximal phalanx of left ring finger, subsequent encounter for fracture with routine healing: Secondary | ICD-10-CM | POA: Diagnosis not present

## 2016-07-11 DIAGNOSIS — M79642 Pain in left hand: Secondary | ICD-10-CM | POA: Diagnosis not present

## 2016-07-12 DIAGNOSIS — S022XXA Fracture of nasal bones, initial encounter for closed fracture: Secondary | ICD-10-CM | POA: Diagnosis not present

## 2016-07-12 DIAGNOSIS — H9203 Otalgia, bilateral: Secondary | ICD-10-CM | POA: Diagnosis not present

## 2016-07-15 DIAGNOSIS — M79642 Pain in left hand: Secondary | ICD-10-CM | POA: Diagnosis not present

## 2016-07-24 DIAGNOSIS — J449 Chronic obstructive pulmonary disease, unspecified: Secondary | ICD-10-CM | POA: Diagnosis not present

## 2016-07-24 DIAGNOSIS — R296 Repeated falls: Secondary | ICD-10-CM | POA: Diagnosis not present

## 2016-07-24 DIAGNOSIS — I350 Nonrheumatic aortic (valve) stenosis: Secondary | ICD-10-CM | POA: Diagnosis not present

## 2016-07-29 DIAGNOSIS — M25551 Pain in right hip: Secondary | ICD-10-CM | POA: Diagnosis not present

## 2016-07-29 DIAGNOSIS — M79642 Pain in left hand: Secondary | ICD-10-CM | POA: Diagnosis not present

## 2016-08-01 ENCOUNTER — Ambulatory Visit (INDEPENDENT_AMBULATORY_CARE_PROVIDER_SITE_OTHER): Payer: Medicare Other | Admitting: Podiatry

## 2016-08-01 DIAGNOSIS — B351 Tinea unguium: Secondary | ICD-10-CM | POA: Diagnosis not present

## 2016-08-01 DIAGNOSIS — L84 Corns and callosities: Secondary | ICD-10-CM | POA: Diagnosis not present

## 2016-08-01 DIAGNOSIS — M79674 Pain in right toe(s): Secondary | ICD-10-CM

## 2016-08-01 DIAGNOSIS — M79675 Pain in left toe(s): Secondary | ICD-10-CM | POA: Diagnosis not present

## 2016-08-05 DIAGNOSIS — Z9181 History of falling: Secondary | ICD-10-CM | POA: Diagnosis not present

## 2016-08-05 DIAGNOSIS — S0232XD Fracture of orbital floor, left side, subsequent encounter for fracture with routine healing: Secondary | ICD-10-CM | POA: Diagnosis not present

## 2016-08-05 DIAGNOSIS — R296 Repeated falls: Secondary | ICD-10-CM | POA: Diagnosis not present

## 2016-08-05 DIAGNOSIS — M47896 Other spondylosis, lumbar region: Secondary | ICD-10-CM | POA: Diagnosis not present

## 2016-08-05 DIAGNOSIS — M25551 Pain in right hip: Secondary | ICD-10-CM | POA: Diagnosis not present

## 2016-08-05 DIAGNOSIS — S62615D Displaced fracture of proximal phalanx of left ring finger, subsequent encounter for fracture with routine healing: Secondary | ICD-10-CM | POA: Diagnosis not present

## 2016-08-07 NOTE — Progress Notes (Signed)
Subjective: 83 y.o. returns the office today for painful, elongated, thickened toenails which she cannot trim herself. Denies any redness or drainage around the nails. She also has calluses and corns to her feet that should have trimmed as they're causing pressure and pain. Denies any redness or drainage or any swelling. Denies any acute changes since last appointment and no new complaints today. Denies any systemic complaints such as fevers, chills, nausea, vomiting.   Objective: AAO 3, NAD DP/PT pulses palpable, CRT less than 3 seconds Nails hypertrophic, dystrophic, elongated, brittle, discolored 10. There is tenderness overlying the nails 1-5 bilaterally. There is no surrounding erythema or drainage along the nail sites. Hyperkeratotic lesions are present on the corresponding aspect of the right fourth and fifth digits in the interspace. Also there is also hyperkeratotic lesions to the fifth PIPJ. Upon debridement there is no underlying ulceration, drainage or signs of infection. No open lesions or pre-ulcerative lesions are identified. No other areas of tenderness bilateral lower extremities. No overlying edema, erythema, increased warmth. No pain with calf compression, swelling, warmth, erythema.  Assessment: Patient presents with symptomatic onychomycosis; symptomatic hyperkeratotic lesions  Plan: -Treatment options including alternatives, risks, complications were discussed -Nails sharply debrided 10 without complication/bleeding. -Hyperkeratotic lesions were sharply debrided today without complications or bleeding. -Discussed daily foot inspection. If there are any changes, to call the office immediately.  -Follow-up in 3 months or sooner if any problems are to arise. In the meantime, encouraged to call the office with any questions, concerns, changes symptoms.  Celesta Gentile, DPM

## 2016-08-11 ENCOUNTER — Emergency Department (HOSPITAL_COMMUNITY)
Admission: EM | Admit: 2016-08-11 | Discharge: 2016-08-11 | Disposition: A | Discharge status: Home / Self Care | Payer: Medicare Other | Attending: Emergency Medicine | Admitting: Emergency Medicine

## 2016-08-11 ENCOUNTER — Encounter (HOSPITAL_COMMUNITY): Payer: Self-pay | Admitting: Emergency Medicine

## 2016-08-11 ENCOUNTER — Emergency Department (HOSPITAL_COMMUNITY): Payer: Medicare Other

## 2016-08-11 DIAGNOSIS — Z85828 Personal history of other malignant neoplasm of skin: Secondary | ICD-10-CM | POA: Diagnosis not present

## 2016-08-11 DIAGNOSIS — J449 Chronic obstructive pulmonary disease, unspecified: Secondary | ICD-10-CM | POA: Insufficient documentation

## 2016-08-11 DIAGNOSIS — I509 Heart failure, unspecified: Secondary | ICD-10-CM | POA: Insufficient documentation

## 2016-08-11 DIAGNOSIS — Z79899 Other long term (current) drug therapy: Secondary | ICD-10-CM | POA: Diagnosis not present

## 2016-08-11 DIAGNOSIS — R069 Unspecified abnormalities of breathing: Secondary | ICD-10-CM | POA: Diagnosis not present

## 2016-08-11 DIAGNOSIS — R0602 Shortness of breath: Secondary | ICD-10-CM | POA: Diagnosis not present

## 2016-08-11 DIAGNOSIS — F419 Anxiety disorder, unspecified: Secondary | ICD-10-CM | POA: Diagnosis not present

## 2016-08-11 DIAGNOSIS — Z8673 Personal history of transient ischemic attack (TIA), and cerebral infarction without residual deficits: Secondary | ICD-10-CM | POA: Diagnosis not present

## 2016-08-11 DIAGNOSIS — Z7901 Long term (current) use of anticoagulants: Secondary | ICD-10-CM | POA: Diagnosis not present

## 2016-08-11 DIAGNOSIS — I110 Hypertensive heart disease with heart failure: Secondary | ICD-10-CM | POA: Diagnosis not present

## 2016-08-11 LAB — I-STAT TROPONIN, ED: TROPONIN I, POC: 0.05 ng/mL (ref 0.00–0.08)

## 2016-08-11 LAB — CBC
HCT: 39.8 % (ref 36.0–46.0)
Hemoglobin: 12.9 g/dL (ref 12.0–15.0)
MCH: 32.6 pg (ref 26.0–34.0)
MCHC: 32.4 g/dL (ref 30.0–36.0)
MCV: 100.5 fL — ABNORMAL HIGH (ref 78.0–100.0)
PLATELETS: 218 10*3/uL (ref 150–400)
RBC: 3.96 MIL/uL (ref 3.87–5.11)
RDW: 13.7 % (ref 11.5–15.5)
WBC: 6.6 10*3/uL (ref 4.0–10.5)

## 2016-08-11 LAB — BASIC METABOLIC PANEL
ANION GAP: 9 (ref 5–15)
BUN: 13 mg/dL (ref 6–20)
CO2: 25 mmol/L (ref 22–32)
CREATININE: 0.81 mg/dL (ref 0.44–1.00)
Calcium: 8.7 mg/dL — ABNORMAL LOW (ref 8.9–10.3)
Chloride: 103 mmol/L (ref 101–111)
Glucose, Bld: 93 mg/dL (ref 65–99)
Potassium: 4.5 mmol/L (ref 3.5–5.1)
SODIUM: 137 mmol/L (ref 135–145)

## 2016-08-11 LAB — TROPONIN I: Troponin I: 0.04 ng/mL (ref <0.03)

## 2016-08-11 LAB — BRAIN NATRIURETIC PEPTIDE: B NATRIURETIC PEPTIDE 5: 291.3 pg/mL — AB (ref 0.0–100.0)

## 2016-08-11 MED ORDER — FUROSEMIDE 10 MG/ML IJ SOLN
60.0000 mg | Freq: Once | INTRAMUSCULAR | Status: AC
  Administered 2016-08-11: 60 mg via INTRAVENOUS
  Filled 2016-08-11: qty 6

## 2016-08-11 NOTE — ED Notes (Signed)
Daughter approached this EMT asking if labwork had resulted yet. EMT informed them that the labwork was back and that we were waiting on the MD or PA to come discuss such with them.

## 2016-08-11 NOTE — ED Notes (Signed)
ED Provider at bedside. 

## 2016-08-11 NOTE — ED Provider Notes (Addendum)
[facility] DEPT Provider Note   CSN: [id number] Arrival date & time: 08/11/16  1304     History   Chief Complaint Chief Complaint  Patient presents with  . Shortness of Breath    HPI Caroline Myers is a 83 y.o. female.  HPI This is a 83 year old female who has aortic stenosis and CHF has had increasing shortness of breath over the past couple weeks. She states she was told to increase her Lasix but accidentally stopped taking her Lasix. This shortness of breath increased during the night last night. She felt anxious about this. She states that she is not sure if it is her digestive heart failure or anxiety. She denies any pain. She is comfortable sitting on the bed. It is somewhat worse with lying down. She has been able to go about her activities of daily living. Past Medical History:  Diagnosis Date  . Allergic rhinitis   . Anxiety   . Aortic stenosis    EF normal.  Mild aortic stenosis. Mild to moderate aortic regurgitation  . Arthritis    "neck, spine, hands" (06/28/2016)  . Asthma 2000   mild. tried Advair after bronchospasm after exposure to cats  . CHF (congestive heart failure) ([place]) dx'd 06/28/2016  . COPD (chronic obstructive pulmonary disease) ([place])   . GERD (gastroesophageal reflux disease)    occ  . Intracranial hemorrhage following injury ([place]) 01/03/2015  . Paroxysmal atrial fibrillation (HCC)    pt does not want anticoagulation  . Pneumonia 07/2015  . Scoliosis   . Shortness of breath    occ  . Shoulder pain, left 05/2010   with rotator cuff tear--Dr Alfonso Ramus  . Skin cancer of nose    "MOHS; think it was basal"  . Squamous carcinoma    "burned off my face"  . Stroke [facility]) 1/ 2014; 12/2014   denies residual on 06/28/2016    Patient Active Problem List   Diagnosis Date Noted  . Concussion 07/06/2016  . CHF exacerbation ([place]) 06/28/2016  . Respiratory failure with hypoxia ([place]) 06/28/2016  . URI (upper respiratory infection) 06/28/2016  . Chest  pressure 06/28/2016  . Oral dyskinesia 06/28/2016  . PAF (paroxysmal atrial fibrillation) ([place])   . Falls 05/20/2016  . Pulmonary hypertension 03/15/2016  . Anxiety about health   . Cough   . Hypokalemia   . AKI (acute kidney injury) ([place])   . Acute diastolic congestive heart failure ([place]) 07/20/2015  . Debility 07/20/2015  . Sepsis ([place]) 07/20/2015  . Acute on chronic diastolic congestive heart failure ([place])   . History of CVA (cerebrovascular accident)   . Tachypnea   . Leukocytosis 07/17/2015  . Arterial hypotension   . Hypotension 07/16/2015  . Elevated troponin 07/16/2015  . PNA (pneumonia)   . Severe sepsis ([place]) 07/15/2015  . Acute respiratory failure with hypercapnia ([place]) 07/15/2015  . Chronic anticoagulation 04/18/2015  . Orthostatic hypotension 03/09/2015  . CVA (cerebral infarction) 02/14/2015  . Stroke ([place]) 02/14/2015  . Chronic atrial fibrillation ([place]) 02/14/2015  . Stroke with cerebral ischemia ([place])   . Cerebral infarction due to embolism of left middle cerebral artery ([place])   . Occipital fracture ([place])   . Benign essential HTN   . Other allergic rhinitis   . Depression   . SAH (subarachnoid hemorrhage) ([place]) 01/02/2015  . SDH (subdural hematoma) ([place]) 01/02/2015  . Aortic atherosclerosis ([place]) 08/05/2014  . Nonspecific abnormal unspecified cardiovascular function study 10/28/2013  . Aortic valve disorder 10/28/2013  . HLD (hyperlipidemia)  07/28/2013  . TIA (transient ischemic attack) 07/27/2013  . History of CHF (congestive heart failure) 07/27/2013  . CVA (cerebral vascular accident) ([place]) 07/27/2013  . Urticaria 11/06/2010  . Bronchitis 10/12/2010  . Preventative health care   . Atrial fibrillation ([place]) 04/04/2010  . Anxiety state 03/29/2010  . ATRIAL FLUTTER 03/29/2010  . RIB PAIN, LEFT SIDED 10/12/2009  . ALLERGIC RHINITIS 06/28/2009  . SHOULDER PAIN, LEFT 06/28/2009  . SCIATICA, RIGHT 06/28/2009  . HEADACHE 06/28/2009  . DYSPNEA ON  EXERTION 06/28/2009  . SKIN CANCER, HX OF 06/28/2009    Past Surgical History:  Procedure Laterality Date  . BACK SURGERY    . CATARACT EXTRACTION W/ INTRAOCULAR LENS  IMPLANT, BILATERAL Bilateral   . DILATION AND CURETTAGE OF UTERUS    . HERNIA REPAIR  2003  . LUMBAR LAMINECTOMY/DECOMPRESSION MICRODISCECTOMY  04/01/2012   Procedure: LUMBAR LAMINECTOMY/DECOMPRESSION MICRODISCECTOMY 1 LEVEL;  Surgeon: Eustace Moore, MD;  Location: [facility];  Service: Neurosurgery;  Laterality: Right;  Right Lumbar four-five extraforaminal microdiskectomy  . MOHS SURGERY     "nose"  . SHOULDER ARTHROSCOPY Right 2000   ACROMIOPLASTY  . TONSILLECTOMY      OB History    No data available       Home Medications    Prior to Admission medications   Medication Sig Start Date End Date Taking? Authorizing Provider  ALPRAZolam (XANAX) 0.25 MG tablet Take 1 tablet (0.25 mg total) by mouth 3 (three) times daily as needed for anxiety. Patient taking differently: Take 0.125 mg by mouth daily as needed for anxiety.  06/30/16  Yes Shanker Kristeen Mans, MD  apixaban (ELIQUIS) 5 MG TABS tablet Take 1 tablet (5 mg total) by mouth 2 (two) times daily. 07/28/15  Yes Daniel J Angiulli, PA-C  b complex vitamins tablet Take 1 tablet by mouth daily.   Yes Historical Provider, MD  DULERA 200-5 MCG/ACT AERO Inhale 2 puffs into the lungs 2 (two) times daily.  03/14/16  Yes Historical Provider, MD  furosemide (LASIX) 20 MG tablet Take 1 tablet (20 mg total) by mouth daily. 06/30/16  Yes Shanker Kristeen Mans, MD  potassium chloride (K-DUR) 10 MEQ tablet Take 1 tablet (10 mEq total) by mouth daily. 06/30/16  Yes Shanker Kristeen Mans, MD  PROAIR HFA [phone] Base) MCG/ACT inhaler Inhale 2 puffs into the lungs every 4 (four) hours as needed for wheezing or shortness of breath. 06/30/16  Yes Shanker Kristeen Mans, MD  sertraline (ZOLOFT) 50 MG tablet Take 1 tablet (50 mg total) by mouth daily. 07/28/15  Yes Daniel J Angiulli, PA-C  albuterol  (PROVENTIL) (2.5 MG/3ML) 0.083% nebulizer solution Take 3 mLs (2.5 mg total) by nebulization every 4 (four) hours as needed for wheezing or shortness of breath. 06/30/16   Shanker Kristeen Mans, MD  cephALEXin (KEFLEX) 250 MG capsule Take 1 capsule (250 mg total) by mouth 4 (four) times daily. Patient not taking: Reported on 08/11/2016 07/05/16   Junius Creamer, NP  tiotropium (SPIRIVA) 18 MCG inhalation capsule Place 1 capsule (18 mcg total) into inhaler and inhale daily. 06/30/16   Shanker Kristeen Mans, MD    Family History Family History  Problem Relation Age of Onset  . Stroke Sister   . Heart attack Neg Hx   . Hypertension Neg Hx     Social History Social History  Substance Use Topics  . Smoking status: Never Smoker  . Smokeless tobacco: Never Used  . Alcohol use 0.0 oz/week  Comment: 06/28/2016 "moved to assisted living 06/18/2016; nothing since then"     Allergies   Tape and Verapamil   Review of Systems Review of Systems  All other systems reviewed and are negative.    Physical Exam Updated Vital Signs BP 118/76   Pulse 68   Resp 20   Wt 60.3 kg   SpO2 97%   BMI 22.13 kg/m   Physical Exam  Constitutional: She is oriented to person, place, and time. She appears well-developed and well-nourished. No distress.  HENT:  Head: Normocephalic and atraumatic.  Right Ear: External ear normal.  Left Ear: External ear normal.  Nose: Nose normal.  Eyes: Conjunctivae and EOM are normal. Pupils are equal, round, and reactive to light.  Neck: Normal range of motion. Neck supple.  Pulmonary/Chest: Effort normal.  Musculoskeletal: Normal range of motion. She exhibits no edema.  Neurological: She is alert and oriented to person, place, and time. She exhibits normal muscle tone. Coordination normal.  Skin: Skin is warm and dry.  Psychiatric: She has a normal mood and affect. Her behavior is normal. Thought content normal.  Nursing note and vitals reviewed.    ED Treatments /  Results  Labs (all labs ordered are listed, but only abnormal results are displayed) Labs Reviewed  CBC  BASIC METABOLIC PANEL  TROPONIN I  [place], ED    EKG  EKG Interpretation None       Radiology Dg Chest 2 View  Result Date: 08/11/2016 CLINICAL DATA:  Shortness of Breath, CHF EXAM: CHEST  2 VIEW COMPARISON:  07/06/2016 FINDINGS: Cardiomegaly. Mild interstitial prominence within the lungs, likely early interstitial edema. Small bilateral effusions. No acute bony abnormality. IMPRESSION: Cardiomegaly with increasing interstitial prominence, likely mild interstitial edema. Small effusions. Electronically Signed   By: Rolm Baptise M.D.   On: 08/11/2016 15:02    Procedures Procedures (including critical care time)  Medications Ordered in ED Medications  furosemide (LASIX) injection 60 mg (not administered)     Initial Impression / Assessment and Plan / ED Course  I have reviewed the triage vital signs and the nursing notes.  Pertinent labs & imaging results that were available during my care of the patient were reviewed by me and considered in my medical decision making (see chart for details).     This is a 83 year old female with mild congestive heart failure who has not been taking her Lasix as prescribed. Here she is given some IV Lasix and appears hemodynamically stable. Her CBC, chest x-Ravneet Spilker, and EKG are consistent with prior. Dr. Tamera Punt will follow up on her chemistry and BNP. It is likely that she will be stable for discharge and to restart her home Lasix. We have discussed need for compliance with medication and follow-up and she and her daughter voice understanding  Final Clinical Impressions(s) / ED Diagnoses   Final diagnoses:  Congestive heart failure, unspecified congestive heart failure chronicity, unspecified congestive heart failure type [facility])  Anxiety    New Prescriptions New Prescriptions   No medications on file       Pattricia Boss, MD 08/11/16 [place], MD 08/11/16 [phone]

## 2016-08-11 NOTE — ED Provider Notes (Signed)
Patient presents with shortness of breath. Her symptoms are consistent with some mild fluid overload. Patient does not have any increased work of breathing. She's maintaining normal oxygen saturations of 94-95% on room air. She does not have any chest pain. She has diuresed in the ED and feels better. She has a very minimal bump in her troponin. I spoke with the cardiology fellow on call who has reviewed her chart and feels that this is most likely related to her congestive heart failure. He is comfortable with patient being discharged home. He does not feel that a second troponin needs to be checked. I discussed these findings with the patient and her daughter. I did advise the patient that she needs to have close follow-up with her primary care provider or cardiologist.   Malvin Johns, MD 08/11/16 1818

## 2016-08-11 NOTE — ED Triage Notes (Signed)
Pt in from ALF via [facility] with c/o sob. Per EMS, pt newly dx with CHF, misunderstood q day Lasix orders and has not taken a dose in 10 days. Pt denies cp, sats 97% on 2L. Hx of anxiety, Afib. A&Ox4, VSS

## 2016-08-11 NOTE — Discharge Instructions (Signed)
Take your lasix in the morning and at night tomorrow, then go back to your normal dose after that.

## 2016-08-12 ENCOUNTER — Telehealth: Payer: Self-pay | Admitting: Interventional Cardiology

## 2016-08-12 DIAGNOSIS — R296 Repeated falls: Secondary | ICD-10-CM | POA: Diagnosis not present

## 2016-08-12 DIAGNOSIS — M47896 Other spondylosis, lumbar region: Secondary | ICD-10-CM | POA: Diagnosis not present

## 2016-08-12 DIAGNOSIS — S62615D Displaced fracture of proximal phalanx of left ring finger, subsequent encounter for fracture with routine healing: Secondary | ICD-10-CM | POA: Diagnosis not present

## 2016-08-12 DIAGNOSIS — M25551 Pain in right hip: Secondary | ICD-10-CM | POA: Diagnosis not present

## 2016-08-12 DIAGNOSIS — S0232XD Fracture of orbital floor, left side, subsequent encounter for fracture with routine healing: Secondary | ICD-10-CM | POA: Diagnosis not present

## 2016-08-12 DIAGNOSIS — Z9181 History of falling: Secondary | ICD-10-CM | POA: Diagnosis not present

## 2016-08-12 NOTE — Telephone Encounter (Signed)
I spoke with pt's daughter who is calling to see if pt is due to renew assistance application for Eliquis. She reports pt has recently moved (address in chart is new address). I spoke with [place] program and pt's assistance expired at the end of December.  Renewal letter was sent to pt in December at her old address. I spoke with pt's daughter and she will print out application on line and bring to our office with proof of income. If unable to print I told her she could stop by office to pick up. Pt has 3 week supply of Eliquis left.

## 2016-08-12 NOTE — Telephone Encounter (Signed)
F/U Call:  Caroline Myers is returning your call, thanks.

## 2016-08-12 NOTE — Telephone Encounter (Signed)
Attempted to call back.  Phone rang several times with no answer and no message.  Will try again later.

## 2016-08-12 NOTE — Telephone Encounter (Signed)
New Message   When was the last form filled out for her to send to [facility]?  They need that prescription sent over to them   1. Which medications need to be refilled? (please list name of each medication and dose if known)  apixaban (ELIQUIS) 5 MG TABS tablet Take 1 tablet (5 mg total) by mouth 2 (two) times daily.     3. Do they need a 30 day or 90 day supply? 90  Please call

## 2016-08-14 DIAGNOSIS — M25551 Pain in right hip: Secondary | ICD-10-CM | POA: Diagnosis not present

## 2016-08-14 DIAGNOSIS — S0232XD Fracture of orbital floor, left side, subsequent encounter for fracture with routine healing: Secondary | ICD-10-CM | POA: Diagnosis not present

## 2016-08-14 DIAGNOSIS — S62615D Displaced fracture of proximal phalanx of left ring finger, subsequent encounter for fracture with routine healing: Secondary | ICD-10-CM | POA: Diagnosis not present

## 2016-08-14 DIAGNOSIS — Z9181 History of falling: Secondary | ICD-10-CM | POA: Diagnosis not present

## 2016-08-14 DIAGNOSIS — R296 Repeated falls: Secondary | ICD-10-CM | POA: Diagnosis not present

## 2016-08-14 DIAGNOSIS — M47896 Other spondylosis, lumbar region: Secondary | ICD-10-CM | POA: Diagnosis not present

## 2016-08-15 DIAGNOSIS — M25551 Pain in right hip: Secondary | ICD-10-CM | POA: Diagnosis not present

## 2016-08-15 DIAGNOSIS — Z9181 History of falling: Secondary | ICD-10-CM | POA: Diagnosis not present

## 2016-08-15 DIAGNOSIS — M47896 Other spondylosis, lumbar region: Secondary | ICD-10-CM | POA: Diagnosis not present

## 2016-08-15 DIAGNOSIS — R296 Repeated falls: Secondary | ICD-10-CM | POA: Diagnosis not present

## 2016-08-15 DIAGNOSIS — S0232XD Fracture of orbital floor, left side, subsequent encounter for fracture with routine healing: Secondary | ICD-10-CM | POA: Diagnosis not present

## 2016-08-15 DIAGNOSIS — S62615D Displaced fracture of proximal phalanx of left ring finger, subsequent encounter for fracture with routine healing: Secondary | ICD-10-CM | POA: Diagnosis not present

## 2016-08-19 DIAGNOSIS — S62615D Displaced fracture of proximal phalanx of left ring finger, subsequent encounter for fracture with routine healing: Secondary | ICD-10-CM | POA: Diagnosis not present

## 2016-08-19 DIAGNOSIS — M47896 Other spondylosis, lumbar region: Secondary | ICD-10-CM | POA: Diagnosis not present

## 2016-08-19 DIAGNOSIS — S0232XD Fracture of orbital floor, left side, subsequent encounter for fracture with routine healing: Secondary | ICD-10-CM | POA: Diagnosis not present

## 2016-08-19 DIAGNOSIS — M25551 Pain in right hip: Secondary | ICD-10-CM | POA: Diagnosis not present

## 2016-08-19 DIAGNOSIS — Z9181 History of falling: Secondary | ICD-10-CM | POA: Diagnosis not present

## 2016-08-19 DIAGNOSIS — R296 Repeated falls: Secondary | ICD-10-CM | POA: Diagnosis not present

## 2016-08-20 DIAGNOSIS — R296 Repeated falls: Secondary | ICD-10-CM | POA: Diagnosis not present

## 2016-08-20 DIAGNOSIS — S62615D Displaced fracture of proximal phalanx of left ring finger, subsequent encounter for fracture with routine healing: Secondary | ICD-10-CM | POA: Diagnosis not present

## 2016-08-20 DIAGNOSIS — M47896 Other spondylosis, lumbar region: Secondary | ICD-10-CM | POA: Diagnosis not present

## 2016-08-20 DIAGNOSIS — S0232XD Fracture of orbital floor, left side, subsequent encounter for fracture with routine healing: Secondary | ICD-10-CM | POA: Diagnosis not present

## 2016-08-20 DIAGNOSIS — M25551 Pain in right hip: Secondary | ICD-10-CM | POA: Diagnosis not present

## 2016-08-20 DIAGNOSIS — Z9181 History of falling: Secondary | ICD-10-CM | POA: Diagnosis not present

## 2016-08-21 ENCOUNTER — Ambulatory Visit (INDEPENDENT_AMBULATORY_CARE_PROVIDER_SITE_OTHER): Payer: Medicare Other | Admitting: Interventional Cardiology

## 2016-08-21 VITALS — BP 98/52 | HR 58 | Ht 64.0 in | Wt 134.8 lb

## 2016-08-21 DIAGNOSIS — S62615D Displaced fracture of proximal phalanx of left ring finger, subsequent encounter for fracture with routine healing: Secondary | ICD-10-CM | POA: Diagnosis not present

## 2016-08-21 DIAGNOSIS — I359 Nonrheumatic aortic valve disorder, unspecified: Secondary | ICD-10-CM

## 2016-08-21 DIAGNOSIS — I482 Chronic atrial fibrillation, unspecified: Secondary | ICD-10-CM

## 2016-08-21 DIAGNOSIS — I5032 Chronic diastolic (congestive) heart failure: Secondary | ICD-10-CM | POA: Diagnosis not present

## 2016-08-21 DIAGNOSIS — R296 Repeated falls: Secondary | ICD-10-CM | POA: Diagnosis not present

## 2016-08-21 DIAGNOSIS — S0232XD Fracture of orbital floor, left side, subsequent encounter for fracture with routine healing: Secondary | ICD-10-CM | POA: Diagnosis not present

## 2016-08-21 DIAGNOSIS — M47896 Other spondylosis, lumbar region: Secondary | ICD-10-CM | POA: Diagnosis not present

## 2016-08-21 DIAGNOSIS — Z9181 History of falling: Secondary | ICD-10-CM | POA: Diagnosis not present

## 2016-08-21 DIAGNOSIS — Z7901 Long term (current) use of anticoagulants: Secondary | ICD-10-CM

## 2016-08-21 DIAGNOSIS — M25551 Pain in right hip: Secondary | ICD-10-CM | POA: Diagnosis not present

## 2016-08-21 MED ORDER — POTASSIUM CHLORIDE ER 10 MEQ PO TBCR: 10.0000 meq | EXTENDED_RELEASE_TABLET | Freq: Every day | ORAL | 3 refills | Status: AC

## 2016-08-21 MED ORDER — FUROSEMIDE 20 MG PO TABS
20.0000 mg | ORAL_TABLET | Freq: Every day | ORAL | 3 refills | Status: DC
Start: 2016-08-21 — End: 2016-12-15

## 2016-08-21 NOTE — Patient Instructions (Signed)
Medication Instructions:  None  Labwork: None  Testing/Procedures: None  Follow-Up: Your physician wants you to follow-up in: 6 months with Dr. Varanasi.  You will receive a reminder letter in the mail two months in advance. If you don't receive a letter, please call our office to schedule the follow-up appointment.   Any Other Special Instructions Will Be Listed Below (If Applicable).     If you need a refill on your cardiac medications before your next appointment, please call your pharmacy.   

## 2016-08-21 NOTE — Progress Notes (Signed)
Cardiology Office Note   Date:  08/21/2016   ID:  Caroline Myers, DOB [birth year], MRN [id number]  PCP:  Melinda Crutch, MD    Chief Complaint  Patient presents with  . Paroxysmal A Fib  f/u AFib   Wt Readings from Last 3 Encounters:  08/21/16 134 lb 12.8 oz (61.1 kg)  08/11/16 133 lb (60.3 kg)  07/05/16 133 lb (60.3 kg)       History of Present Illness: Caroline Myers is a 83 y.o. female who has past medical history of permanent atrial flutter on eliquis with CHA2DS2-VASC score of 5, aortic valve stenosis, CVA 2. She underwent treadmill stress test in April 2008 which shows normal perfusion study, however she had chest pain and EKG changes during the terminal portion concerning for false negative study. She had normal coronaries on cardiac catheterization in May 2008.   She was admitted in January 2017 with sepsis, hypertension, tachypnea and fever. Initial EKG showed atrial fibrillation with RVR, nonspecific T-wave abnormalities with heart rate of 130s. Cardiology was consulted during that admission. Repeat 2-D echo on 07/16/2015 showed EF 50-55%, severely dilated left atrium, moderate TR, peak PA pressure 51 mmHg. During January admission, she did have elevated troponin which was felt to be due to demand ischemia from tachypnea, febrile state, and rapid heart rate.   She has had a few falls. She has hit her head and seriously hurt herself, required stitches. She has a cane that she has not been using.    She was hospitalized with acute diastoilic heart faiulure in 12/17.  SHe was diuresed and sent home.  She stopped the lasix in 2/18 and then retained fluid again.  She has restarted the Lasix and needs a prescription.  She does well as long as she is taking her diuretics.      Past Medical History:  Diagnosis Date  . Allergic rhinitis   . Anxiety   . Aortic stenosis    EF normal.  Mild aortic stenosis. Mild to moderate aortic regurgitation  . Arthritis    "neck, spine,  hands" (06/28/2016)  . Asthma 2000   mild. tried Advair after bronchospasm after exposure to cats  . CHF (congestive heart failure) ([place]) dx'd 06/28/2016  . COPD (chronic obstructive pulmonary disease) ([place])   . GERD (gastroesophageal reflux disease)    occ  . Intracranial hemorrhage following injury ([place]) 01/03/2015  . Paroxysmal atrial fibrillation (HCC)    pt does not want anticoagulation  . Pneumonia 07/2015  . Scoliosis   . Shortness of breath    occ  . Shoulder pain, left 05/2010   with rotator cuff tear--Dr Alfonso Ramus  . Skin cancer of nose    "MOHS; think it was basal"  . Squamous carcinoma    "burned off my face"  . Stroke [facility]) 1/ 2014; 12/2014   denies residual on 06/28/2016    Past Surgical History:  Procedure Laterality Date  . BACK SURGERY    . CATARACT EXTRACTION W/ INTRAOCULAR LENS  IMPLANT, BILATERAL Bilateral   . DILATION AND CURETTAGE OF UTERUS    . HERNIA REPAIR  2003  . LUMBAR LAMINECTOMY/DECOMPRESSION MICRODISCECTOMY  04/01/2012   Procedure: LUMBAR LAMINECTOMY/DECOMPRESSION MICRODISCECTOMY 1 LEVEL;  Surgeon: Eustace Moore, MD;  Location: [facility];  Service: Neurosurgery;  Laterality: Right;  Right Lumbar four-five extraforaminal microdiskectomy  . MOHS SURGERY     "nose"  . SHOULDER ARTHROSCOPY Right 2000   ACROMIOPLASTY  . TONSILLECTOMY  Current Outpatient Prescriptions  Medication Sig Dispense Refill  . albuterol (PROVENTIL) (2.5 MG/3ML) 0.083% nebulizer solution Take 3 mLs (2.5 mg total) by nebulization every 4 (four) hours as needed for wheezing or shortness of breath. 75 mL 0  . ALPRAZolam (XANAX) 0.25 MG tablet Take 0.25 mg by mouth daily as needed for anxiety.    Marland Kitchen apixaban (ELIQUIS) 5 MG TABS tablet Take 1 tablet (5 mg total) by mouth 2 (two) times daily. 180 tablet 3  . b complex vitamins tablet Take 1 tablet by mouth daily.    . furosemide (LASIX) 20 MG tablet Take 1 tablet (20 mg total) by mouth daily. 30 tablet 0  . potassium  chloride (K-DUR) 10 MEQ tablet Take 1 tablet (10 mEq total) by mouth daily. 30 tablet 0  . PROAIR HFA 108 (90 Base) MCG/ACT inhaler Inhale 2 puffs into the lungs every 4 (four) hours as needed for wheezing or shortness of breath. 18 g 0  . sertraline (ZOLOFT) 50 MG tablet Take 1 tablet (50 mg total) by mouth daily. 30 tablet 4  . SYMBICORT 160-4.5 MCG/ACT inhaler Inhale 2 puffs into the lungs 2 (two) times daily.  5   No current facility-administered medications for this visit.     Allergies:   Tape and Verapamil    Social History:  The patient  reports that she has never smoked. She has never used smokeless tobacco. She reports that she drinks alcohol. She reports that she does not use drugs.   Family History:  The patient's family history includes Stroke in her sister.    ROS:  Please see the history of present illness.   Otherwise, review of systems are positive for occasional falls-most significantly with fall forward where she required stitches on her forehead over a month ago.   All other systems are reviewed and negative.    PHYSICAL EXAM: VS:  BP (!) 98/52   Pulse (!) 58   Ht 5\' 4"  (1.626 m)   Wt 134 lb 12.8 oz (61.1 kg)   BMI 23.14 kg/m  , BMI Body mass index is 23.14 kg/m. GEN: Well nourished, well developed, in no acute distress  HEENT: normal  Neck: no JVD, carotid bruits, or masses Cardiac: irregularly irregular; ; 2/6 systolic  Murmur;,no rubs, or gallops,no edema  Respiratory:  clear to auscultation bilaterally, normal work of breathing GI: soft, nontender, nondistended, + BS MS: no deformity or atrophy  Skin: warm and dry, no rash Neuro:  Strength and sensation are intact Psych: euthymic mood, full affect   EKG:   The ekg ordered in March 2017 demonstrates atrial flutter, rate controlled   Recent Labs: 06/30/2016: Magnesium 2.2 07/06/2016: ALT 21 08/11/2016: B Natriuretic Peptide 291.3; BUN 13; Creatinine, Ser 0.81; Hemoglobin 12.9; Platelets 218; Potassium  4.5; Sodium 137   Lipid Panel    Component Value Date/Time   CHOL 199 02/15/2015 0054   TRIG 101 02/15/2015 0054   HDL 42 02/15/2015 0054   CHOLHDL 4.7 02/15/2015 0054   VLDL 20 02/15/2015 0054   LDLCALC 137 (H) 02/15/2015 0054     Other studies Reviewed: Additional studies/ records that were reviewed today with results demonstrating: Hospital records.  2017 echo   ASSESSMENT AND PLAN:  1. AFib/Atrial flutter: Rate controlled.  Eliquis for stroke prevention.  We reviewed her dose of Eliquis a few months ago with the Pharm.D. They did confirm that she should be on the 5 mg twice a day dose. She does have a history  of CVA in the past, which occurred when her anticoagulation was held.  I stressed the importance of avoiding falls and using her cane to avoid falling. 2. Aortic stenosis: No symptoms of severe aortic stenosis.  No syncope. Mild by echo in January 2017  3. Pulmonary hypertension: moderate by echo in Jan 2017.  No plan for advanced treatment of PAH at this time. 4. Given her fairly recent fall, we discussed in depth the rationale behind anticoagulation. She is in agreement with taking her anticoagulation given that she had a CVA in the past. We stressed the point of avoiding bleeding complications.  5. Chronic diastolic heart failure: Refill Lasix and potassium. I stressed the importance of daily weights and avoiding sodium as well. Currently, she appears euvolemic.    Current medicines are reviewed at length with the patient today.  The patient concerns regarding her medicines were addressed.  The following changes have been made:  No change  Labs/ tests ordered today include:  No orders of the defined types were placed in this encounter.   Recommend 150 minutes/week of aerobic exercise Low fat, low carb, high fiber diet recommended  Disposition:   FU in 6 months   Signed, Larae Grooms, MD  08/21/2016 2:58 PM    [facility] [place], [place], [place]  [address] Phone: [phone]; Fax: [phone]

## 2016-08-22 DIAGNOSIS — Z9181 History of falling: Secondary | ICD-10-CM | POA: Diagnosis not present

## 2016-08-22 DIAGNOSIS — S0232XD Fracture of orbital floor, left side, subsequent encounter for fracture with routine healing: Secondary | ICD-10-CM | POA: Diagnosis not present

## 2016-08-22 DIAGNOSIS — S62615D Displaced fracture of proximal phalanx of left ring finger, subsequent encounter for fracture with routine healing: Secondary | ICD-10-CM | POA: Diagnosis not present

## 2016-08-22 DIAGNOSIS — M25551 Pain in right hip: Secondary | ICD-10-CM | POA: Diagnosis not present

## 2016-08-22 DIAGNOSIS — R296 Repeated falls: Secondary | ICD-10-CM | POA: Diagnosis not present

## 2016-08-22 DIAGNOSIS — M47896 Other spondylosis, lumbar region: Secondary | ICD-10-CM | POA: Diagnosis not present

## 2016-08-23 ENCOUNTER — Encounter: Payer: Self-pay | Admitting: Interventional Cardiology

## 2016-08-26 ENCOUNTER — Ambulatory Visit: Payer: Medicare Other | Admitting: Podiatry

## 2016-08-26 DIAGNOSIS — J449 Chronic obstructive pulmonary disease, unspecified: Secondary | ICD-10-CM | POA: Diagnosis not present

## 2016-08-26 DIAGNOSIS — I350 Nonrheumatic aortic (valve) stenosis: Secondary | ICD-10-CM | POA: Diagnosis not present

## 2016-08-26 DIAGNOSIS — M79642 Pain in left hand: Secondary | ICD-10-CM | POA: Diagnosis not present

## 2016-08-27 DIAGNOSIS — M25551 Pain in right hip: Secondary | ICD-10-CM | POA: Diagnosis not present

## 2016-08-27 DIAGNOSIS — S0232XD Fracture of orbital floor, left side, subsequent encounter for fracture with routine healing: Secondary | ICD-10-CM | POA: Diagnosis not present

## 2016-08-27 DIAGNOSIS — S62615D Displaced fracture of proximal phalanx of left ring finger, subsequent encounter for fracture with routine healing: Secondary | ICD-10-CM | POA: Diagnosis not present

## 2016-08-27 DIAGNOSIS — Z9181 History of falling: Secondary | ICD-10-CM | POA: Diagnosis not present

## 2016-08-27 DIAGNOSIS — R296 Repeated falls: Secondary | ICD-10-CM | POA: Diagnosis not present

## 2016-08-27 DIAGNOSIS — M47896 Other spondylosis, lumbar region: Secondary | ICD-10-CM | POA: Diagnosis not present

## 2016-08-29 ENCOUNTER — Ambulatory Visit: Payer: Medicare Other | Admitting: Podiatry

## 2016-08-29 ENCOUNTER — Observation Stay (HOSPITAL_COMMUNITY): Payer: Medicare Other

## 2016-08-29 ENCOUNTER — Emergency Department (HOSPITAL_COMMUNITY): Payer: Medicare Other

## 2016-08-29 ENCOUNTER — Inpatient Hospital Stay (HOSPITAL_COMMUNITY)
Admission: EM | Admit: 2016-08-29 | Discharge: 2016-08-31 | DRG: 307 | Disposition: A | Discharge status: Home / Self Care | Payer: Medicare Other | Attending: Internal Medicine | Admitting: Internal Medicine

## 2016-08-29 ENCOUNTER — Encounter (HOSPITAL_COMMUNITY): Payer: Self-pay

## 2016-08-29 DIAGNOSIS — R0989 Other specified symptoms and signs involving the circulatory and respiratory systems: Secondary | ICD-10-CM | POA: Diagnosis not present

## 2016-08-29 DIAGNOSIS — Z888 Allergy status to other drugs, medicaments and biological substances status: Secondary | ICD-10-CM

## 2016-08-29 DIAGNOSIS — J9811 Atelectasis: Secondary | ICD-10-CM | POA: Diagnosis present

## 2016-08-29 DIAGNOSIS — I352 Nonrheumatic aortic (valve) stenosis with insufficiency: Principal | ICD-10-CM | POA: Diagnosis present

## 2016-08-29 DIAGNOSIS — Z9842 Cataract extraction status, left eye: Secondary | ICD-10-CM

## 2016-08-29 DIAGNOSIS — I480 Paroxysmal atrial fibrillation: Secondary | ICD-10-CM | POA: Diagnosis present

## 2016-08-29 DIAGNOSIS — J449 Chronic obstructive pulmonary disease, unspecified: Secondary | ICD-10-CM | POA: Diagnosis present

## 2016-08-29 DIAGNOSIS — Z823 Family history of stroke: Secondary | ICD-10-CM

## 2016-08-29 DIAGNOSIS — F411 Generalized anxiety disorder: Secondary | ICD-10-CM | POA: Diagnosis present

## 2016-08-29 DIAGNOSIS — M1990 Unspecified osteoarthritis, unspecified site: Secondary | ICD-10-CM | POA: Diagnosis present

## 2016-08-29 DIAGNOSIS — R0689 Other abnormalities of breathing: Secondary | ICD-10-CM

## 2016-08-29 DIAGNOSIS — Z7951 Long term (current) use of inhaled steroids: Secondary | ICD-10-CM

## 2016-08-29 DIAGNOSIS — Z66 Do not resuscitate: Secondary | ICD-10-CM | POA: Diagnosis present

## 2016-08-29 DIAGNOSIS — R0789 Other chest pain: Secondary | ICD-10-CM | POA: Diagnosis not present

## 2016-08-29 DIAGNOSIS — Z9889 Other specified postprocedural states: Secondary | ICD-10-CM

## 2016-08-29 DIAGNOSIS — Z85828 Personal history of other malignant neoplasm of skin: Secondary | ICD-10-CM

## 2016-08-29 DIAGNOSIS — Z961 Presence of intraocular lens: Secondary | ICD-10-CM | POA: Diagnosis present

## 2016-08-29 DIAGNOSIS — I481 Persistent atrial fibrillation: Secondary | ICD-10-CM | POA: Diagnosis not present

## 2016-08-29 DIAGNOSIS — I5022 Chronic systolic (congestive) heart failure: Secondary | ICD-10-CM | POA: Diagnosis not present

## 2016-08-29 DIAGNOSIS — Z79899 Other long term (current) drug therapy: Secondary | ICD-10-CM

## 2016-08-29 DIAGNOSIS — I110 Hypertensive heart disease with heart failure: Secondary | ICD-10-CM | POA: Diagnosis present

## 2016-08-29 DIAGNOSIS — Z9841 Cataract extraction status, right eye: Secondary | ICD-10-CM

## 2016-08-29 DIAGNOSIS — R079 Chest pain, unspecified: Secondary | ICD-10-CM | POA: Diagnosis present

## 2016-08-29 DIAGNOSIS — R918 Other nonspecific abnormal finding of lung field: Secondary | ICD-10-CM | POA: Diagnosis not present

## 2016-08-29 DIAGNOSIS — Z8679 Personal history of other diseases of the circulatory system: Secondary | ICD-10-CM

## 2016-08-29 DIAGNOSIS — K219 Gastro-esophageal reflux disease without esophagitis: Secondary | ICD-10-CM | POA: Diagnosis present

## 2016-08-29 DIAGNOSIS — R0602 Shortness of breath: Secondary | ICD-10-CM | POA: Diagnosis not present

## 2016-08-29 DIAGNOSIS — I10 Essential (primary) hypertension: Secondary | ICD-10-CM | POA: Diagnosis present

## 2016-08-29 DIAGNOSIS — I350 Nonrheumatic aortic (valve) stenosis: Secondary | ICD-10-CM

## 2016-08-29 DIAGNOSIS — Z8673 Personal history of transient ischemic attack (TIA), and cerebral infarction without residual deficits: Secondary | ICD-10-CM

## 2016-08-29 DIAGNOSIS — R071 Chest pain on breathing: Secondary | ICD-10-CM | POA: Diagnosis not present

## 2016-08-29 LAB — MAGNESIUM: MAGNESIUM: 2.1 mg/dL (ref 1.7–2.4)

## 2016-08-29 LAB — BASIC METABOLIC PANEL
Anion gap: 8 (ref 5–15)
BUN: 14 mg/dL (ref 6–20)
CALCIUM: 8.7 mg/dL — AB (ref 8.9–10.3)
CO2: 27 mmol/L (ref 22–32)
CREATININE: 0.86 mg/dL (ref 0.44–1.00)
Chloride: 105 mmol/L (ref 101–111)
GFR calc non Af Amer: 58 mL/min — ABNORMAL LOW (ref >60)
GLUCOSE: 98 mg/dL (ref 65–99)
Potassium: 4.1 mmol/L (ref 3.5–5.1)
Sodium: 140 mmol/L (ref 135–145)

## 2016-08-29 LAB — CBC
HCT: 41.8 % (ref 36.0–46.0)
Hemoglobin: 13.5 g/dL (ref 12.0–15.0)
MCH: 32.8 pg (ref 26.0–34.0)
MCHC: 32.3 g/dL (ref 30.0–36.0)
MCV: 101.5 fL — ABNORMAL HIGH (ref 78.0–100.0)
PLATELETS: 200 10*3/uL (ref 150–400)
RBC: 4.12 MIL/uL (ref 3.87–5.11)
RDW: 13.7 % (ref 11.5–15.5)
WBC: 8 10*3/uL (ref 4.0–10.5)

## 2016-08-29 LAB — TROPONIN I
TROPONIN I: 0.05 ng/mL — AB (ref <0.03)
Troponin I: 0.04 ng/mL (ref <0.03)
Troponin I: 0.04 ng/mL (ref <0.03)

## 2016-08-29 LAB — PHOSPHORUS: Phosphorus: 3.8 mg/dL (ref 2.5–4.6)

## 2016-08-29 LAB — I-STAT TROPONIN, ED: TROPONIN I, POC: 0.05 ng/mL (ref 0.00–0.08)

## 2016-08-29 LAB — BRAIN NATRIURETIC PEPTIDE: B Natriuretic Peptide: 214 pg/mL — ABNORMAL HIGH (ref 0.0–100.0)

## 2016-08-29 MED ORDER — SERTRALINE HCL 50 MG PO TABS
50.0000 mg | ORAL_TABLET | Freq: Every day | ORAL | Status: DC
  Administered 2016-08-31: 50 mg via ORAL
  Filled 2016-08-29 (×2): qty 1

## 2016-08-29 MED ORDER — KCL IN DEXTROSE-NACL 20-5-0.45 MEQ/L-%-% IV SOLN
INTRAVENOUS | Status: DC
  Administered 2016-08-29: via INTRAVENOUS
  Filled 2016-08-29: qty 1000

## 2016-08-29 MED ORDER — ALPRAZOLAM 0.25 MG PO TABS
0.2500 mg | ORAL_TABLET | Freq: Every day | ORAL | Status: DC | PRN
  Administered 2016-08-29: 0.25 mg via ORAL
  Filled 2016-08-29: qty 1

## 2016-08-29 MED ORDER — MOMETASONE FURO-FORMOTEROL FUM 200-5 MCG/ACT IN AERO
2.0000 | INHALATION_SPRAY | Freq: Two times a day (BID) | RESPIRATORY_TRACT | Status: DC
  Administered 2016-08-29 – 2016-08-31 (×4): 2 via RESPIRATORY_TRACT
  Filled 2016-08-29: qty 8.8

## 2016-08-29 MED ORDER — APIXABAN 5 MG PO TABS
5.0000 mg | ORAL_TABLET | Freq: Two times a day (BID) | ORAL | Status: DC
  Administered 2016-08-29 – 2016-08-30 (×2): 5 mg via ORAL
  Filled 2016-08-29 (×2): qty 1

## 2016-08-29 MED ORDER — ALBUTEROL SULFATE (2.5 MG/3ML) 0.083% IN NEBU: 2.5000 mg | INHALATION_SOLUTION | RESPIRATORY_TRACT | Status: DC | PRN

## 2016-08-29 MED ORDER — ASPIRIN EC 325 MG PO TBEC
325.0000 mg | DELAYED_RELEASE_TABLET | Freq: Every day | ORAL | Status: DC
  Administered 2016-08-30: 325 mg via ORAL
  Filled 2016-08-29: qty 1

## 2016-08-29 MED ORDER — ONDANSETRON HCL 4 MG/2ML IJ SOLN: 4.0000 mg | Freq: Four times a day (QID) | INTRAMUSCULAR | Status: DC | PRN

## 2016-08-29 MED ORDER — ACETAMINOPHEN 325 MG PO TABS: 650.0000 mg | ORAL_TABLET | ORAL | Status: DC | PRN

## 2016-08-29 MED ORDER — HYDRALAZINE HCL 20 MG/ML IJ SOLN: 10.0000 mg | Freq: Three times a day (TID) | INTRAMUSCULAR | Status: DC | PRN

## 2016-08-29 MED ORDER — POTASSIUM CHLORIDE CRYS ER 10 MEQ PO TBCR
10.0000 meq | EXTENDED_RELEASE_TABLET | Freq: Every day | ORAL | Status: DC
  Administered 2016-08-30: 10 meq via ORAL
  Filled 2016-08-29 (×2): qty 1

## 2016-08-29 MED ORDER — FUROSEMIDE 20 MG PO TABS
20.0000 mg | ORAL_TABLET | Freq: Every day | ORAL | Status: DC
  Administered 2016-08-30 – 2016-08-31 (×2): 20 mg via ORAL
  Filled 2016-08-29 (×2): qty 1

## 2016-08-29 MED ORDER — MORPHINE SULFATE (PF) 4 MG/ML IV SOLN: 2.0000 mg | INTRAVENOUS | Status: DC | PRN

## 2016-08-29 MED ORDER — GI COCKTAIL ~~LOC~~: 30.0000 mL | Freq: Four times a day (QID) | ORAL | Status: DC | PRN

## 2016-08-29 NOTE — ED Provider Notes (Signed)
Medical screening examination/treatment/procedure(s) were conducted as a shared visit with non-physician practitioner(s) and myself.  I personally evaluated the patient during the encounter. Briefly, the patient is a 83 y.o. female with a history of congestive heart failure on Lasix) paroxysmal A. fib presents to the ED with sudden onset substernal chest pressure that resolved prior to arrival. EKG with A. fib that is rate controlled. Initial troponin negative. BNP below 500. No evidence of volume overload. Chest x-ray with evidence of COPD and stable pleural effusions however no evidence of pneumonia, pneumothorax, abnormal contour of aortic arch. Low suspicion for pulmonary embolism.. Patient not classic for aortic dissection or esophageal perforation. Patient high risk for ACS and will be admitted for ACS rule out.   EKG Interpretation  Date/Time:  Thursday August 29 2016 09:34:40 EST Ventricular Rate:  72 PR Interval:    QRS Duration: 94 QT Interval:  477 QTC Calculation: 523 R Axis:   -71 Text Interpretation:  Atrial fibrillation Left anterior fascicular block Probable anteroseptal infarct, old Borderline repolarization abnormality Prolonged QT interval Otherwise no significant change Confirmed by [facility] MD, Ovetta Bazzano ([id number]) on 08/29/2016 9:52:00 AM           Fatima Blank, MD 08/29/16 1102

## 2016-08-29 NOTE — ED Provider Notes (Signed)
[facility] DEPT Provider Note   CSN: [id number] Arrival date & time: 08/29/16  0909     History   Chief Complaint Chief Complaint  Patient presents with  . Chest Pain    HPI AOKI Caroline Myers is a 83 y.o. female.  Caroline Myers is a 83 y.o. Female coming from [facility] with a history of CHF, paroxysmal atrial fibrillation on Eliquis, aortic stenosis, and CVA who presents to the emergency room complaining of onset of chest tightness that woke her up this morning around 5 AM. Patient reports she woke up around 5 AM and felt tightness across her chest. She denies any chest pain. She reports some increased shortness of breath with this. She also reports developing a frontal headache that is since resolved. EMS was called and patient was provided with 324 mg of aspirin and one round of nitroglycerin. Currently patient reports her chest tightness has resolved. She denies any chest pain. She does report some slight increased shortness of breath and coughing recently. No fevers. She reports lots of stress after the passing of her husband. She reports every time she speaks or thinks about her husband she gets a lump in her throat which makes her cough. Patient denies fevers, hemoptysis, leg swelling, abdominal pain, nausea, vomiting, numbness, tingling, weakness, double vision or rashes.   The history is provided by the patient, medical records and a relative. No language interpreter was used.  Chest Pain   Associated symptoms include headaches (resolved. ) and shortness of breath. Pertinent negatives include no abdominal pain, no back pain, no cough, no dizziness, no fever, no nausea, no numbness, no palpitations, no vomiting and no weakness.    Past Medical History:  Diagnosis Date  . Allergic rhinitis   . Anxiety   . Aortic stenosis    EF normal.  Mild aortic stenosis. Mild to moderate aortic regurgitation  . Arthritis    "neck, spine, hands" (06/28/2016)  . Asthma 2000   mild. tried Advair after bronchospasm after exposure to cats  . CHF (congestive heart failure) ([place]) dx'd 06/28/2016  . COPD (chronic obstructive pulmonary disease) ([place])   . GERD (gastroesophageal reflux disease)    occ  . Intracranial hemorrhage following injury ([place]) 01/03/2015  . Paroxysmal atrial fibrillation (HCC)    pt does not want anticoagulation  . Pneumonia 07/2015  . Scoliosis   . Shortness of breath    occ  . Shoulder pain, left 05/2010   with rotator cuff tear--Dr Alfonso Ramus  . Skin cancer of nose    "MOHS; think it was basal"  . Squamous carcinoma    "burned off my face"  . Stroke [facility]) 1/ 2014; 12/2014   denies residual on 06/28/2016    Patient Active Problem List   Diagnosis Date Noted  . Concussion 07/06/2016  . CHF exacerbation ([place]) 06/28/2016  . Respiratory failure with hypoxia ([place]) 06/28/2016  . URI (upper respiratory infection) 06/28/2016  . Chest pressure 06/28/2016  . Oral dyskinesia 06/28/2016  . PAF (paroxysmal atrial fibrillation) ([place])   . Falls 05/20/2016  . Pulmonary hypertension 03/15/2016  . Anxiety about health   . Cough   . Hypokalemia   . AKI (acute kidney injury) ([place])   . Acute diastolic congestive heart failure ([place]) 07/20/2015  . Debility 07/20/2015  . Sepsis ([place]) 07/20/2015  . Chronic diastolic heart failure ([place])   . History of CVA (cerebrovascular accident)   . Tachypnea   . Leukocytosis 07/17/2015  . Arterial hypotension   .  Hypotension 07/16/2015  . Elevated troponin 07/16/2015  . PNA (pneumonia)   . Severe sepsis ([place]) 07/15/2015  . Acute respiratory failure with hypercapnia ([place]) 07/15/2015  . Chronic anticoagulation 04/18/2015  . Orthostatic hypotension 03/09/2015  . CVA (cerebral infarction) 02/14/2015  . Stroke ([place]) 02/14/2015  . Chronic atrial fibrillation ([place]) 02/14/2015  . Stroke with cerebral ischemia ([place])   . Cerebral infarction due to embolism of left middle cerebral artery ([place])   . Occipital fracture  ([place])   . Benign essential HTN   . Other allergic rhinitis   . Depression   . SAH (subarachnoid hemorrhage) ([place]) 01/02/2015  . SDH (subdural hematoma) ([place]) 01/02/2015  . Aortic atherosclerosis ([place]) 08/05/2014  . Nonspecific abnormal unspecified cardiovascular function study 10/28/2013  . Aortic valve disorder 10/28/2013  . HLD (hyperlipidemia) 07/28/2013  . TIA (transient ischemic attack) 07/27/2013  . History of CHF (congestive heart failure) 07/27/2013  . CVA (cerebral vascular accident) ([place]) 07/27/2013  . Urticaria 11/06/2010  . Bronchitis 10/12/2010  . Preventative health care   . Atrial fibrillation ([place]) 04/04/2010  . Anxiety state 03/29/2010  . ATRIAL FLUTTER 03/29/2010  . RIB PAIN, LEFT SIDED 10/12/2009  . ALLERGIC RHINITIS 06/28/2009  . SHOULDER PAIN, LEFT 06/28/2009  . SCIATICA, RIGHT 06/28/2009  . HEADACHE 06/28/2009  . DYSPNEA ON EXERTION 06/28/2009  . SKIN CANCER, HX OF 06/28/2009    Past Surgical History:  Procedure Laterality Date  . BACK SURGERY    . CATARACT EXTRACTION W/ INTRAOCULAR LENS  IMPLANT, BILATERAL Bilateral   . DILATION AND CURETTAGE OF UTERUS    . HERNIA REPAIR  2003  . LUMBAR LAMINECTOMY/DECOMPRESSION MICRODISCECTOMY  04/01/2012   Procedure: LUMBAR LAMINECTOMY/DECOMPRESSION MICRODISCECTOMY 1 LEVEL;  Surgeon: Eustace Moore, MD;  Location: [facility];  Service: Neurosurgery;  Laterality: Right;  Right Lumbar four-five extraforaminal microdiskectomy  . MOHS SURGERY     "nose"  . SHOULDER ARTHROSCOPY Right 2000   ACROMIOPLASTY  . TONSILLECTOMY      OB History    No data available       Home Medications    Prior to Admission medications   Medication Sig Start Date End Date Taking? Authorizing Provider  albuterol (PROVENTIL) (2.5 MG/3ML) 0.083% nebulizer solution Take 3 mLs (2.5 mg total) by nebulization every 4 (four) hours as needed for wheezing or shortness of breath. 06/30/16  Yes Shanker Kristeen Mans, MD  ALPRAZolam Duanne Moron) 0.25 MG  tablet Take 0.25 mg by mouth daily as needed for anxiety.   Yes Historical Provider, MD  apixaban (ELIQUIS) 5 MG TABS tablet Take 1 tablet (5 mg total) by mouth 2 (two) times daily. 07/28/15  Yes Daniel J Angiulli, PA-C  b complex vitamins tablet Take 1 tablet by mouth daily.   Yes Historical Provider, MD  furosemide (LASIX) 20 MG tablet Take 1 tablet (20 mg total) by mouth daily. 08/21/16  Yes Jettie Booze, MD  potassium chloride (K-DUR) 10 MEQ tablet Take 1 tablet (10 mEq total) by mouth daily. 08/21/16  Yes Jettie Booze, MD  PROAIR HFA 108 [phone] Base) MCG/ACT inhaler Inhale 2 puffs into the lungs every 4 (four) hours as needed for wheezing or shortness of breath. 06/30/16  Yes Shanker Kristeen Mans, MD  sertraline (ZOLOFT) 50 MG tablet Take 1 tablet (50 mg total) by mouth daily. 07/28/15  Yes Daniel J Angiulli, PA-C  SYMBICORT 160-4.5 MCG/ACT inhaler Inhale 2 puffs into the lungs 2 (two) times daily. 08/01/16  Yes Historical Provider, MD  Family History Family History  Problem Relation Age of Onset  . Stroke Sister   . Heart attack Neg Hx   . Hypertension Neg Hx     Social History Social History  Substance Use Topics  . Smoking status: Never Smoker  . Smokeless tobacco: Never Used  . Alcohol use 0.0 oz/week     Comment: 06/28/2016 "moved to assisted living 06/18/2016; nothing since then"     Allergies   Tape and Verapamil   Review of Systems Review of Systems  Constitutional: Negative for chills and fever.  HENT: Negative for congestion and sore throat.   Eyes: Negative for visual disturbance.  Respiratory: Positive for shortness of breath. Negative for cough and wheezing.   Cardiovascular: Positive for chest pain. Negative for palpitations and leg swelling.  Gastrointestinal: Negative for abdominal pain, nausea and vomiting.  Genitourinary: Negative for difficulty urinating and dysuria.  Musculoskeletal: Negative for back pain and neck pain.  Skin: Negative for rash.   Neurological: Positive for headaches (resolved. ). Negative for dizziness, syncope, weakness, light-headedness and numbness.     Physical Exam Updated Vital Signs BP 110/79   Pulse 80   Temp 98.1 F (36.7 C) (Oral)   Resp 25   Ht 5' 5.5" (1.664 m)   Wt 59.4 kg   SpO2 95%   BMI 21.47 kg/m   Physical Exam  Constitutional: She is oriented to person, place, and time. She appears well-developed and well-nourished. No distress.  Nontoxic appearing.  HENT:  Head: Normocephalic and atraumatic.  Mouth/Throat: Oropharynx is clear and moist.  Eyes: Conjunctivae are normal. Pupils are equal, round, and reactive to light. Right eye exhibits no discharge. Left eye exhibits no discharge.  Neck: Normal range of motion. Neck supple. No JVD present. No tracheal deviation present.  Cardiovascular: Normal rate and intact distal pulses.  Exam reveals no gallop and no friction rub.   Murmur heard. Irregular irregular rate. A. fib on the monitor. Bilateral radial, posterior tibialis and dorsalis pedis pulses are intact.    Pulmonary/Chest: Effort normal and breath sounds normal. No stridor. No respiratory distress. She has no wheezes. She has no rales. She exhibits no tenderness.  Lungs clear auscultation bilaterally. No increased work of breathing.  Abdominal: Soft. There is no tenderness. There is no guarding.  Musculoskeletal: She exhibits no edema or tenderness.  No lower extremity edema or tenderness.  Lymphadenopathy:    She has no cervical adenopathy.  Neurological: She is alert and oriented to person, place, and time. Coordination normal.  Skin: Skin is warm and dry. Capillary refill takes less than 2 seconds. No rash noted. She is not diaphoretic. No erythema. No pallor.  Psychiatric: She has a normal mood and affect. Her behavior is normal.  Nursing note and vitals reviewed.    ED Treatments / Results  Labs (all labs ordered are listed, but only abnormal results are displayed) Labs  Reviewed  BASIC METABOLIC PANEL - Abnormal; Notable for the following:       Result Value   Calcium 8.7 (*)    GFR calc non Af Amer 58 (*)    All other components within normal limits  CBC - Abnormal; Notable for the following:    MCV 101.5 (*)    All other components within normal limits  BRAIN NATRIURETIC PEPTIDE - Abnormal; Notable for the following:    B Natriuretic Peptide 214.0 (*)    All other components within normal limits  I-STAT TROPOININ, ED    EKG  EKG Interpretation  Date/Time:  Thursday August 29 2016 09:34:40 EST Ventricular Rate:  72 PR Interval:    QRS Duration: 94 QT Interval:  477 QTC Calculation: 523 R Axis:   -71 Text Interpretation:  Atrial fibrillation Left anterior fascicular block Probable anteroseptal infarct, old Borderline repolarization abnormality Prolonged QT interval Otherwise no significant change Confirmed by [facility] MD, PEDRO ([id number]) on 08/29/2016 9:52:00 AM       Radiology Dg Chest 2 View  Result Date: 08/29/2016 CLINICAL DATA:  Awakened today with chest tightness and shortness of breath. History of dizziness over the past week. History of CHF, atrial fibrillation, asthma-COPD, prior CVA EXAM: CHEST  2 VIEW COMPARISON:  PA and lateral chest x-ray dated August 11, 2016 FINDINGS: The lungs are hyperinflated with hemidiaphragm flattening. There is a small pleural effusion on the right. There is no alveolar infiltrate. The interstitial markings are coarse. The cardiac silhouette is enlarged. The pulmonary vascularity is not engorged. There is calcification in the wall of the aortic arch. There is stable moderate dextrocurvature centered in the mid thoracic spine. There is multilevel degenerative disc space narrowing. IMPRESSION: COPD with chronic interstitial prominence. Cardiomegaly with no definite pulmonary vascular congestion. Small right pleural effusion versus pleural thickening, stable. No definite acute pneumonia. Thoracic aortic  atherosclerosis. Electronically Signed   By: David  Martinique M.D.   On: 08/29/2016 10:14    Procedures Procedures (including critical care time)  Medications Ordered in ED Medications - No data to display   Initial Impression / Assessment and Plan / ED Course  I have reviewed the triage vital signs and the nursing notes.  Pertinent labs & imaging results that were available during my care of the patient were reviewed by me and considered in my medical decision making (see chart for details).    This is a 83 y.o. Female coming from [facility] with a history of CHF, paroxysmal atrial fibrillation on Eliquis, aortic stenosis, and CVA who presents to the emergency room complaining of onset of chest tightness that woke her up this morning around 5 AM. Patient reports she woke up around 5 AM and felt tightness across her chest. On exam the patient is afebrile and nontoxic appearing. Lung sounds are clear to auscultation bilaterally. No evidence of volume overload on exam.  EKG without significant change from her last tracing. BNP is 214. Troponin not elevated. CBC and BMP are unremarkable. Chest x-ray shows COPD changes without evidence of congestion. No evidence of PNA on cxr and no fevers or elevated WBC.  Patient's chest pain is concerning that it awoke her from sleep this morning. She is a high-risk heart patient. Will admit for chest pain rule out by hospitalist. Patient and family agree with plan for admission.   I consulted with Dr. Aggie Moats from [facility] who accepted the patient for admission.   This patient was discussed with and evaluated by Dr. Leonette Monarch who agrees with assessment and plan.   Final Clinical Impressions(s) / ED Diagnoses   Final diagnoses:  Nonspecific chest pain    New Prescriptions New Prescriptions   No medications on file     Waynetta Pean, PA-C 08/29/16 1156

## 2016-08-29 NOTE — ED Triage Notes (Signed)
Per EMS - pt from [facility]. Around 0500 began experiencing chest tightness. Reported coughing throughout the night. Reports shortness of breath. Placed on 2L [place] for comfort. Hx CHF, COPD, afib. 12-lead afib. Given 324mg  aspirin and 1 nitro. Pain initially 5/10, now 2/10.

## 2016-08-29 NOTE — ED Notes (Signed)
Hospital MD at bedside speaking with pt.

## 2016-08-29 NOTE — ED Notes (Signed)
Lab at bedside

## 2016-08-29 NOTE — Progress Notes (Signed)
Patient discussed with physician about changing her code status to DNR. Has been corrected in the system and band has been applied. No other needs at this time, call light within reach

## 2016-08-29 NOTE — H&P (Signed)
[facility] History and Physical  Caroline Myers [id number] DOB: [birth year] DOA: 08/29/2016  Referring physician:  PCP: Melinda Crutch, MD   Chief Complaint: "I fell."  HPI: Caroline Myers is a 83 y.o. female  with past medical history of Sheehan syndrome, stroke, arthritis, asthma, COPD, heart failure, atrial fibrillation presents to the hospital complaint chest pain. Patient states that at 5 AM she woke up with chest pressure like someone was standing on her chest. States that this lasted for several minutes and she became alarmed. Patient activated EMS. When EMS arrived the patient a full dose aspirin and nitroglycerin. Patient states that over time the nitroglycerin tablet [place] the discomfort and it was gone when she arrived in emergency room. Patient denies chest trauma. Patient has no history of prior heart attack. Patient did have some cough and associated shortness of breath prior to coming into the emergency room that was worse than her baseline. Patient does have chronic dyspnea.   Review of Systems:  As per HPI otherwise 10 point review of systems negative.    Past Medical History:  Diagnosis Date  . Allergic rhinitis   . Anxiety   . Aortic stenosis    EF normal.  Mild aortic stenosis. Mild to moderate aortic regurgitation  . Arthritis    "neck, spine, hands" (06/28/2016)  . Asthma 2000   mild. tried Advair after bronchospasm after exposure to cats  . CHF (congestive heart failure) ([place]) dx'd 06/28/2016  . COPD (chronic obstructive pulmonary disease) ([place])   . GERD (gastroesophageal reflux disease)    occ  . Intracranial hemorrhage following injury ([place]) 01/03/2015  . Paroxysmal atrial fibrillation (HCC)    pt does not want anticoagulation  . Pneumonia 07/2015  . Scoliosis   . Shortness of breath    occ  . Shoulder pain, left 05/2010   with rotator cuff tear--Dr Alfonso Ramus  . Skin cancer of nose    "MOHS; think it was basal"  . Squamous carcinoma    "burned  off my face"  . Stroke [facility]) 1/ 2014; 12/2014   denies residual on 06/28/2016   Past Surgical History:  Procedure Laterality Date  . BACK SURGERY    . CATARACT EXTRACTION W/ INTRAOCULAR LENS  IMPLANT, BILATERAL Bilateral   . DILATION AND CURETTAGE OF UTERUS    . HERNIA REPAIR  2003  . LUMBAR LAMINECTOMY/DECOMPRESSION MICRODISCECTOMY  04/01/2012   Procedure: LUMBAR LAMINECTOMY/DECOMPRESSION MICRODISCECTOMY 1 LEVEL;  Surgeon: Eustace Moore, MD;  Location: [facility];  Service: Neurosurgery;  Laterality: Right;  Right Lumbar four-five extraforaminal microdiskectomy  . MOHS SURGERY     "nose"  . SHOULDER ARTHROSCOPY Right 2000   ACROMIOPLASTY  . TONSILLECTOMY     Social History:  reports that she has never smoked. She has never used smokeless tobacco. She reports that she drinks alcohol. She reports that she does not use drugs.  Allergies  Allergen Reactions  . Tape Other (See Comments)    Rips skin-paper tape  . Verapamil Other (See Comments)    Per MAR    Family History  Problem Relation Age of Onset  . Stroke Sister   . Heart attack Neg Hx   . Hypertension Neg Hx      Prior to Admission medications   Medication Sig Start Date End Date Taking? Authorizing Provider  albuterol (PROVENTIL) (2.5 MG/3ML) 0.083% nebulizer solution Take 3 mLs (2.5 mg total) by nebulization every 4 (four) hours as needed for wheezing or shortness of breath.  06/30/16  Yes Shanker Kristeen Mans, MD  ALPRAZolam Duanne Moron) 0.25 MG tablet Take 0.25 mg by mouth daily as needed for anxiety.   Yes Historical Provider, MD  apixaban (ELIQUIS) 5 MG TABS tablet Take 1 tablet (5 mg total) by mouth 2 (two) times daily. 07/28/15  Yes Daniel J Angiulli, PA-C  b complex vitamins tablet Take 1 tablet by mouth daily.   Yes Historical Provider, MD  furosemide (LASIX) 20 MG tablet Take 1 tablet (20 mg total) by mouth daily. 08/21/16  Yes Jettie Booze, MD  potassium chloride (K-DUR) 10 MEQ tablet Take 1 tablet (10 mEq total)  by mouth daily. 08/21/16  Yes Jettie Booze, MD  PROAIR HFA 108 [phone] Base) MCG/ACT inhaler Inhale 2 puffs into the lungs every 4 (four) hours as needed for wheezing or shortness of breath. 06/30/16  Yes Shanker Kristeen Mans, MD  sertraline (ZOLOFT) 50 MG tablet Take 1 tablet (50 mg total) by mouth daily. 07/28/15  Yes Daniel J Angiulli, PA-C  SYMBICORT 160-4.5 MCG/ACT inhaler Inhale 2 puffs into the lungs 2 (two) times daily. 08/01/16  Yes Historical Provider, MD   Physical Exam: Vitals:   08/29/16 0930 08/29/16 0945 08/29/16 1030 08/29/16 1115  BP: 134/91 (!) 123/102 125/88 110/79  Pulse: 65 79 71 80  Resp:  21 24 25   Temp:      TempSrc:      SpO2: 99% 98% 95% 95%  Weight:      Height:        Wt Readings from Last 3 Encounters:  08/29/16 59.4 kg (131 lb)  08/21/16 61.1 kg (134 lb 12.8 oz)  08/11/16 60.3 kg (133 lb)    General:  Appears calm and comfortable, Alert and oriented 3 Eyes:  PERRL, EOMI, normal lids, iris ENT:  grossly normal hearing, lips & tongue Neck:  no LAD, masses or thyromegaly Cardiovascular:  RRR, no m/r/g. No LE edema.  Respiratory:  Tachypnea, rhonchi, increased work of breathing Abdomen:  soft, ntnd Skin:  no rash or induration seen on limited exam Musculoskeletal:  grossly normal tone BUE/BLE Psychiatric:  grossly normal mood and affect, speech fluent and appropriate Neurologic:  CN 2-12 grossly intact, moves all extremities in coordinated fashion.          Labs on Admission:  Basic Metabolic Panel:  Recent Labs Lab 08/29/16 0924  NA 140  K 4.1  CL 105  CO2 27  GLUCOSE 98  BUN 14  CREATININE 0.86  CALCIUM 8.7*   Liver Function Tests: No results for input(s): AST, ALT, ALKPHOS, BILITOT, PROT, ALBUMIN in the last 168 hours. No results for input(s): LIPASE, AMYLASE in the last 168 hours. No results for input(s): AMMONIA in the last 168 hours. CBC:  Recent Labs Lab 08/29/16 0924  WBC 8.0  HGB 13.5  HCT 41.8  MCV 101.5*  PLT 200    Cardiac Enzymes: No results for input(s): CKTOTAL, CKMB, CKMBINDEX, TROPONINI in the last 168 hours.  BNP (last 3 results)  Recent Labs  06/28/16 0717 08/11/16 1529 08/29/16 0947  BNP 230.1* 291.3* 214.0*    ProBNP (last 3 results) No results for input(s): PROBNP in the last 8760 hours.   Serum creatinine: 0.86 mg/dL 08/29/16 0924 Estimated creatinine clearance: 39.9 mL/min  CBG: No results for input(s): GLUCAP in the last 168 hours.  Radiological Exams on Admission: Dg Chest 2 View  Result Date: 08/29/2016 CLINICAL DATA:  Awakened today with chest tightness and shortness of breath. History of dizziness over  the past week. History of CHF, atrial fibrillation, asthma-COPD, prior CVA EXAM: CHEST  2 VIEW COMPARISON:  PA and lateral chest x-ray dated August 11, 2016 FINDINGS: The lungs are hyperinflated with hemidiaphragm flattening. There is a small pleural effusion on the right. There is no alveolar infiltrate. The interstitial markings are coarse. The cardiac silhouette is enlarged. The pulmonary vascularity is not engorged. There is calcification in the wall of the aortic arch. There is stable moderate dextrocurvature centered in the mid thoracic spine. There is multilevel degenerative disc space narrowing. IMPRESSION: COPD with chronic interstitial prominence. Cardiomegaly with no definite pulmonary vascular congestion. Small right pleural effusion versus pleural thickening, stable. No definite acute pneumonia. Thoracic aortic atherosclerosis. Electronically Signed   By: David  Martinique M.D.   On: 08/29/2016 10:14    EKG: Independently reviewed. EKG: Ventricular rate 76, restoration 106, QT 463, atrial fibrillation, incomplete right bundle branch block; atrial fibrillation, poor tracing no STEMI; compared to February 2018 no acute ST changes  Assessment/Plan Active Problems:   Anxiety state   History of CHF (congestive heart failure)   Benign essential HTN   Chest pain   1)  CP Given hear score of 7 in ED by EDP. - serial trop ordered, initial neg - prn EKG CP  - prn moprhine CP - prn ntg cp - asa in ED and QD - ECHO ordered for AM - tele bed, cardiac monitoring - ambien for sleep prn - zofran prn for nausea  Atrial fibrillation Continue eloquence  Reflux Consider Protonix if needed  Asthma/COPD As needed albuterol Symbicort--> Dulera bid  Congestive heart failure Continue Lasix and K Dur  Hypertension When necessary hydralazine 10 mg IV as needed for severe blood pressure  Anxiety Continue Zoloft and Xanax  Code Status: FULL  DVT Prophylaxis: Eliquis/SCDs Family Communication: dgtr at bedside Disposition Plan: Pending Improvement  Status: tele obs  Elwin Mocha, MD Family Medicine [facility] [email or web address] Password [email or web address]

## 2016-08-29 NOTE — ED Notes (Signed)
Repeat ekg done

## 2016-08-29 NOTE — Progress Notes (Signed)
Nurse called to let me know that the patient is DNR and not full code. I spoke with the patient and she confirms she is DNR/DNI. Orders placed. Patient states she will ask her daugther to bring in the signed paperwork from home to upload it in our system.

## 2016-08-30 ENCOUNTER — Observation Stay (HOSPITAL_BASED_OUTPATIENT_CLINIC_OR_DEPARTMENT_OTHER): Payer: Medicare Other

## 2016-08-30 DIAGNOSIS — I480 Paroxysmal atrial fibrillation: Secondary | ICD-10-CM | POA: Diagnosis present

## 2016-08-30 DIAGNOSIS — F411 Generalized anxiety disorder: Secondary | ICD-10-CM

## 2016-08-30 DIAGNOSIS — Z79899 Other long term (current) drug therapy: Secondary | ICD-10-CM | POA: Diagnosis not present

## 2016-08-30 DIAGNOSIS — I10 Essential (primary) hypertension: Secondary | ICD-10-CM | POA: Diagnosis not present

## 2016-08-30 DIAGNOSIS — I350 Nonrheumatic aortic (valve) stenosis: Secondary | ICD-10-CM | POA: Diagnosis not present

## 2016-08-30 DIAGNOSIS — J449 Chronic obstructive pulmonary disease, unspecified: Secondary | ICD-10-CM | POA: Diagnosis present

## 2016-08-30 DIAGNOSIS — K219 Gastro-esophageal reflux disease without esophagitis: Secondary | ICD-10-CM | POA: Diagnosis present

## 2016-08-30 DIAGNOSIS — R079 Chest pain, unspecified: Secondary | ICD-10-CM

## 2016-08-30 DIAGNOSIS — Z8679 Personal history of other diseases of the circulatory system: Secondary | ICD-10-CM

## 2016-08-30 DIAGNOSIS — Z9889 Other specified postprocedural states: Secondary | ICD-10-CM | POA: Diagnosis not present

## 2016-08-30 DIAGNOSIS — Z961 Presence of intraocular lens: Secondary | ICD-10-CM | POA: Diagnosis present

## 2016-08-30 DIAGNOSIS — Z7951 Long term (current) use of inhaled steroids: Secondary | ICD-10-CM | POA: Diagnosis not present

## 2016-08-30 DIAGNOSIS — Z888 Allergy status to other drugs, medicaments and biological substances status: Secondary | ICD-10-CM | POA: Diagnosis not present

## 2016-08-30 DIAGNOSIS — Z823 Family history of stroke: Secondary | ICD-10-CM | POA: Diagnosis not present

## 2016-08-30 DIAGNOSIS — Z9842 Cataract extraction status, left eye: Secondary | ICD-10-CM | POA: Diagnosis not present

## 2016-08-30 DIAGNOSIS — Z8673 Personal history of transient ischemic attack (TIA), and cerebral infarction without residual deficits: Secondary | ICD-10-CM | POA: Diagnosis not present

## 2016-08-30 DIAGNOSIS — I481 Persistent atrial fibrillation: Secondary | ICD-10-CM | POA: Diagnosis present

## 2016-08-30 DIAGNOSIS — I5022 Chronic systolic (congestive) heart failure: Secondary | ICD-10-CM | POA: Diagnosis not present

## 2016-08-30 DIAGNOSIS — M1990 Unspecified osteoarthritis, unspecified site: Secondary | ICD-10-CM | POA: Diagnosis present

## 2016-08-30 DIAGNOSIS — Z66 Do not resuscitate: Secondary | ICD-10-CM | POA: Diagnosis present

## 2016-08-30 DIAGNOSIS — Z9841 Cataract extraction status, right eye: Secondary | ICD-10-CM | POA: Diagnosis not present

## 2016-08-30 DIAGNOSIS — I352 Nonrheumatic aortic (valve) stenosis with insufficiency: Secondary | ICD-10-CM | POA: Diagnosis present

## 2016-08-30 DIAGNOSIS — Z85828 Personal history of other malignant neoplasm of skin: Secondary | ICD-10-CM | POA: Diagnosis not present

## 2016-08-30 DIAGNOSIS — J9811 Atelectasis: Secondary | ICD-10-CM | POA: Diagnosis present

## 2016-08-30 DIAGNOSIS — I110 Hypertensive heart disease with heart failure: Secondary | ICD-10-CM | POA: Diagnosis present

## 2016-08-30 LAB — ECHOCARDIOGRAM COMPLETE
Height: 65.5 in
WEIGHTICAEL: 2096 [oz_av]

## 2016-08-30 LAB — TROPONIN I
TROPONIN I: 0.04 ng/mL — AB (ref <0.03)
Troponin I: 0.04 ng/mL (ref <0.03)

## 2016-08-30 MED ORDER — APIXABAN 2.5 MG PO TABS
2.5000 mg | ORAL_TABLET | Freq: Two times a day (BID) | ORAL | Status: DC
  Administered 2016-08-30 – 2016-08-31 (×2): 2.5 mg via ORAL
  Filled 2016-08-30 (×2): qty 1

## 2016-08-30 NOTE — Progress Notes (Signed)
  Echocardiogram 2D Echocardiogram has been performed.  Diamond Nickel 08/30/2016, 12:17 PM

## 2016-08-30 NOTE — Progress Notes (Addendum)
PROGRESS NOTE    Caroline Myers  [id number] DOB: 11-Jan-1924 DOA: 08/29/2016 PCP: Melinda Crutch, MD     Brief Narrative: 83 yo female with AS and atrial fibrillation, presented with acute chest pain, not exertional, associated with dyspnea.  Pressure in nature. On the initial examination hemodynamically stable. Admitted to rule out ACS. Echocardiogram with worsening AS and reduced LV function, troponin trending up.   Assessment & Plan:   Principal Problem:   Chest pain Active Problems:   Anxiety state   History of CHF (congestive heart failure)   Benign essential HTN   1. Atypical chest. Cardiac troponin trending up, 0.04 to 0.05, will continue to trend 3 more sets. EKG not suggestive acute ischemia, personally reviewed atrial fibrillation with poor R wave progression and left axis deviation. Will continue pain control and as needed nitroglycerin.   2. Aortic stenosis. Echocardiogram with worsening LF function and aortic stenosis, per patient's daughter at the bedside, patient having frequent episodes of pulmonary edema, no syncope. Patient probably not candidate for valve repair or replacement. May need palliative care, will discuss with cardiology.   3. Chronic Heart failure systolic. Will resume oral furosemide, target negative fluid balance, will hold on IV fluids.   4. COPD. Continue oxymetry monitoring, supplemental 02 per [place] to target 02 sat above 92%  5. Atrial fibrillation. Continue rate control and anticoagualtion   6. HTN. Continue blood pressure control  7. Anxiety. Positive confusion and disorientation per patient's daughter at the bedside. Continue alprazolam and sertraline.    DVT prophylaxis: apixaban  Code Status: dnr Family Communication: I spoke with patient's daughter at the bedside  Disposition Plan: home    Consultants:     Procedures:    Antimicrobials:    Subjective: Patient with persistent chest pain on the right precordial region,  moderate in intensity, associated with dyspnea.   Objective: Vitals:   08/29/16 2042 08/30/16 0441 08/30/16 0749 08/30/16 1311  BP:  (!) 149/77  127/71  Pulse:  73  81  Resp:  (!) 21  18  Temp:  98.2 F (36.8 C)  98.3 F (36.8 C)  TempSrc:  Oral  Oral  SpO2: 97% 100% 98% 97%  Weight:      Height:        Intake/Output Summary (Last 24 hours) at 08/30/16 1438 Last data filed at 08/30/16 1300  Gross per 24 hour  Intake              600 ml  Output                0 ml  Net              600 ml   Filed Weights   08/29/16 0924  Weight: 59.4 kg (131 lb)    Examination:  General exam: deconditioned E ENT: mild pallor, no icterus.  Respiratory system: Mild rales at bases, no wheezing or rhonchi.  Respiratory effort normal. Cardiovascular system: S1 & S2 heard, RRR. No JVD, rubs, gallops or clicks. No pedal edema. Positive murmur systolic  Gastrointestinal system: Abdomen is nondistended, soft and nontender. No organomegaly or masses felt. Normal bowel sounds heard. Central nervous system: Alert and oriented. No focal neurological deficits. Extremities: Symmetric 5 x 5 power. Skin: No rashes, lesions or ulcers   Data Reviewed: I have personally reviewed following labs and imaging studies  CBC:  Recent Labs Lab 08/29/16 0924  WBC 8.0  HGB 13.5  HCT 41.8  MCV 101.5*  PLT [id number]   Basic Metabolic Panel:  Recent Labs Lab 08/29/16 0924 08/29/16 1817  NA 140  --   K 4.1  --   CL 105  --   CO2 27  --   GLUCOSE 98  --   BUN 14  --   CREATININE 0.86  --   CALCIUM 8.7*  --   MG  --  2.1  PHOS  --  3.8   GFR: Estimated Creatinine Clearance: 39.9 mL/min (by C-G formula based on SCr of 0.86 mg/dL). Liver Function Tests: No results for input(s): AST, ALT, ALKPHOS, BILITOT, PROT, ALBUMIN in the last 168 hours. No results for input(s): LIPASE, AMYLASE in the last 168 hours. No results for input(s): AMMONIA in the last 168 hours. Coagulation Profile: No results for  input(s): INR, PROTIME in the last 168 hours. Cardiac Enzymes:  Recent Labs Lab 08/29/16 1339 08/29/16 1552 08/29/16 1817  TROPONINI 0.04* 0.04* 0.05*   BNP (last 3 results) No results for input(s): PROBNP in the last 8760 hours. HbA1C: No results for input(s): HGBA1C in the last 72 hours. CBG: No results for input(s): GLUCAP in the last 168 hours. Lipid Profile: No results for input(s): CHOL, HDL, LDLCALC, TRIG, CHOLHDL, LDLDIRECT in the last 72 hours. Thyroid Function Tests: No results for input(s): TSH, T4TOTAL, FREET4, T3FREE, THYROIDAB in the last 72 hours. Anemia Panel: No results for input(s): VITAMINB12, FOLATE, FERRITIN, TIBC, IRON, RETICCTPCT in the last 72 hours. Sepsis Labs: No results for input(s): PROCALCITON, LATICACIDVEN in the last 168 hours.  No results found for this or any previous visit (from the past 240 hour(s)).       Radiology Studies: Dg Chest 2 View  Result Date: 08/29/2016 CLINICAL DATA:  Awakened today with chest tightness and shortness of breath. History of dizziness over the past week. History of CHF, atrial fibrillation, asthma-COPD, prior CVA EXAM: CHEST  2 VIEW COMPARISON:  PA and lateral chest x-ray dated August 11, 2016 FINDINGS: The lungs are hyperinflated with hemidiaphragm flattening. There is a small pleural effusion on the right. There is no alveolar infiltrate. The interstitial markings are coarse. The cardiac silhouette is enlarged. The pulmonary vascularity is not engorged. There is calcification in the wall of the aortic arch. There is stable moderate dextrocurvature centered in the mid thoracic spine. There is multilevel degenerative disc space narrowing. IMPRESSION: COPD with chronic interstitial prominence. Cardiomegaly with no definite pulmonary vascular congestion. Small right pleural effusion versus pleural thickening, stable. No definite acute pneumonia. Thoracic aortic atherosclerosis. Electronically Signed   By: David  Martinique  M.D.   On: 08/29/2016 10:14   Ct Chest Wo Contrast  Result Date: 08/29/2016 CLINICAL DATA:  Acute onset of abnormal breath sounds. Initial encounter. EXAM: CT CHEST WITHOUT CONTRAST TECHNIQUE: Multidetector CT imaging of the chest was performed following the standard protocol without IV contrast. COMPARISON:  Chest radiograph performed earlier today at 9:48 a.m. FINDINGS: Cardiovascular: The heart is mildly enlarged. Calcification is noted at the aortic valve, and scattered coronary artery calcifications are seen. Mild calcification is noted along the aortic arch and descending thoracic aorta. Scattered calcification is seen along the proximal great vessels. The thyroid gland is unremarkable. No axillary lymphadenopathy is seen. Mediastinum/Nodes: Scattered prominent right paratracheal nodes measure up to 1.4 cm in short axis. Periaortic nodes measure up to 1.6 cm in short axis. No pericardial effusion is identified. The thyroid gland is unremarkable. No axillary lymphadenopathy is seen. Lungs/Pleura: Trace bilateral pleural fluid is noted, with associated atelectasis.  Mild peripheral scarring is seen bilaterally. Scattered bilateral peribronchovascular nodules are seen, measuring up to 1.2 cm at the left upper lobe, with peripheral calcification. This is thought to reflect a remote atypical infectious process. No pneumothorax is identified. No dominant mass is seen. Upper Abdomen: The visualized portions of the liver and spleen are grossly unremarkable. The visualized portions of the pancreas, adrenal glands and left kidney are within normal limits, aside from small left renal cysts and mild left-sided perinephric stranding. The proximal abdominal aorta is somewhat tortuous, with scattered calcification. Musculoskeletal: No acute osseous abnormalities are identified. Degenerative joint space loss is noted at the right glenohumeral joint. The visualized musculature is unremarkable in appearance. IMPRESSION: 1.  Scattered bilateral peribronchovascular nodules, measuring up to 1.2 cm of the left upper lobe, with peripheral calcification. This is thought to reflect a remote atypical infectious process. Would correlate for any signs of acute infection. 2. Trace bilateral pleural fluid, with associated atelectasis. Mild peripheral scarring noted bilaterally. 3. Mild cardiomegaly. Scattered coronary artery calcifications. Calcification at the aortic valve. 4. Prominent right paratracheal and periaortic nodes, measuring up to 1.6 cm in short axis. 5. Small left renal cysts noted. 6. Scattered aortic atherosclerosis, with tortuosity of the proximal abdominal aorta. 7. Degenerative joint space loss at the right glenohumeral joint Electronically Signed   By: Garald Balding M.D.   On: 08/29/2016 21:26        Scheduled Meds: . apixaban  2.5 mg Oral BID  . aspirin EC  325 mg Oral Daily  . furosemide  20 mg Oral Daily  . mometasone-formoterol  2 puff Inhalation BID  . potassium chloride  10 mEq Oral Daily  . sertraline  50 mg Oral Daily   Continuous Infusions: . dextrose 5 % and 0.45 % NaCl with KCl 20 mEq/L 75 mL/hr at 08/29/16 2355     LOS: 0 days        Tawni Millers, MD [facility] Pager [phone]  If 7PM-7AM, please contact night-coverage [email or web address] Password [facility] 08/30/2016, 2:38 PM

## 2016-08-30 NOTE — Care Management Obs Status (Signed)
[facility] NOTIFICATION   Patient Details  Name: Caroline Myers MRN: [id number] Date of Birth: 12-Aug-1923   Medicare Observation Status Notification Given:  Yes    Carles Collet, RN 08/30/2016, 1:33 PM

## 2016-08-31 DIAGNOSIS — I5022 Chronic systolic (congestive) heart failure: Secondary | ICD-10-CM

## 2016-08-31 LAB — BASIC METABOLIC PANEL
Anion gap: 8 (ref 5–15)
BUN: 14 mg/dL (ref 6–20)
CHLORIDE: 105 mmol/L (ref 101–111)
CO2: 27 mmol/L (ref 22–32)
CREATININE: 0.81 mg/dL (ref 0.44–1.00)
Calcium: 8.9 mg/dL (ref 8.9–10.3)
GFR calc non Af Amer: >60 mL/min (ref >60)
Glucose, Bld: 101 mg/dL — ABNORMAL HIGH (ref 65–99)
POTASSIUM: 3.9 mmol/L (ref 3.5–5.1)
SODIUM: 140 mmol/L (ref 135–145)

## 2016-08-31 LAB — TROPONIN I: Troponin I: 0.04 ng/mL (ref <0.03)

## 2016-08-31 MED ORDER — ACETAMINOPHEN 500 MG PO TABS: 500.0000 mg | ORAL_TABLET | Freq: Four times a day (QID) | ORAL | 0 refills | Status: DC | PRN

## 2016-08-31 NOTE — Progress Notes (Signed)
Order received to discharge patient.  Telemetry monitor removed and CCMD notified.  PIV access removed.  Discharge instructions, follow up, medications and instructions for their use discussed with patient. 

## 2016-08-31 NOTE — Discharge Summary (Addendum)
Physician Discharge Summary  Caroline Myers [id number] DOB: [birth year] DOA: 08/29/2016  PCP: Melinda Crutch, MD  Admit date: 08/29/2016 Discharge date: 08/31/2016  Admitted From: Home  Disposition:  Home   Recommendations for Outpatient Follow-up:  1. Follow up with PCP in 1- week 2. Echocardiogram with worsening aortic stenosis and left ventricle systolic function   Home Health: No  Equipment/Devices: No   Discharge Condition: Stable  CODE STATUS: DNR Diet recommendation: Heart Healthy  Brief/Interim Summary: This is a 83 yo female who presented to the hospital with chief complain of chest pain. 5 AM in the morning patient woke up with sensation of pressure in her chest. Lasted several minutes. T=By the time EMS arrived her pain had improved. Over last month patient had recurrent episodes of volume overload. Initial physical examination blood pressure 134/91, heart rate 65, respiratory rate 24, oxygen saturation 95%. She was noted to have tachypnea with rhonchi as well as increased work of breathing. Heart S1-S2 present, rhythmic, positive murmur at the left sternal border, third intercostal space, systolic, 3-6. radiated into the carotids. Sodium 140, potassium 4.1, chloride 105, bicarbonate 27, BUN 14, glucose 98, creatinine 0.6, BNP 214, white count 8.0,  HB 13.5, hematocrit 41.8, platelets 200, troponin 0.04. Her EKG was atrial fibrillation with a left axis deviation. Chest x-ray showed cardiomegaly with right basal atelectasis. CT chest with negative for infiltrates, scattered bilateral peri-bronchovascular nodules. Trace bilateral pleural fluid.  The patient was admitted to the hospital with working diagnosis of atypical chest pain to rule out acute current syndrome.  1. Chest pain. Patient ruled out for acute current syndrome, patient had serial cardiac enzymes. No further chest pain.  2. Aortic stenosis with chronic systolic heart failure. Echocardiogram show reduction on her systolic  left ventricular function down to 35-40%, diffuse hypokinesis. Aortic valve with severe stenosis, (VTI ratio of LVOT to aortic valve: 0.32. Valve area (VTI): 0.92 cm^2. Peak velocity ratio of LVOT to aortic valve: 0.27. Valve area (Vmax): 0.76 cm^2. Mean velocity ratio of LVOT to aortic valve: 0.26. Valve area (Vmean): 0.73 cm^2. Mean gradient (S): 9 mm Hg. Peak gradient (S): 17 mm Hg.) will continue medical management with gentle diureses, rate controlled atrial fibrillation. Patient will need a close follow-up with cardiology as an outpatient. Home health services will be arranged with heart failure services. Considering patient's age and comorbidities likely not a surgical candidate. By the time of discharge patient euvolemic, no angina. No history of syncope.  3. Atrial fibrillation, persistent and chronic. Continue anticoagulation, rate remained well-controlled. Patient not on any AV blocking agents.  4. COPD. Patient was continued on bronchodilator therapy. Oximetry by the time of discharge 97% on room air. Patient seems to do better with Breo, currently insurance not approving this agent, consider to change to this agent as an outpatient.  5. Hypertension. Blood pressure well controlled, systolic [id number].  6. Anxiety. Patient was continued on alprazolam and sertraline with no major complications.   Discharge Diagnoses:  Principal Problem:   Chest pain Active Problems:   Anxiety state   History of CHF (congestive heart failure)   Benign essential HTN    Discharge Instructions   Allergies as of 08/31/2016      Reactions   Tape Other (See Comments)   Rips skin-paper tape   Verapamil Other (See Comments)   Per MAR      Medication List    TAKE these medications   acetaminophen 500 MG tablet Commonly known as:  TYLENOL Take  1 tablet (500 mg total) by mouth every 6 (six) hours as needed.   ALPRAZolam 0.25 MG tablet Commonly known as:  XANAX Take 0.25 mg by mouth daily as  needed for anxiety.   apixaban 5 MG Tabs tablet Commonly known as:  ELIQUIS Take 1 tablet (5 mg total) by mouth 2 (two) times daily.   b complex vitamins tablet Take 1 tablet by mouth daily.   furosemide 20 MG tablet Commonly known as:  LASIX Take 1 tablet (20 mg total) by mouth daily.   potassium chloride 10 MEQ tablet Commonly known as:  K-DUR Take 1 tablet (10 mEq total) by mouth daily.   PROAIR HFA 108 (90 Base) MCG/ACT inhaler Generic drug:  albuterol Inhale 2 puffs into the lungs every 4 (four) hours as needed for wheezing or shortness of breath.   albuterol (2.5 MG/3ML) 0.083% nebulizer solution Commonly known as:  PROVENTIL Take 3 mLs (2.5 mg total) by nebulization every 4 (four) hours as needed for wheezing or shortness of breath.   sertraline 50 MG tablet Commonly known as:  ZOLOFT Take 1 tablet (50 mg total) by mouth daily.   SYMBICORT 160-4.5 MCG/ACT inhaler Generic drug:  budesonide-formoterol Inhale 2 puffs into the lungs 2 (two) times daily.            Durable Medical Equipment        Start     Ordered   08/31/16 0000  Heart failure home health orders  (Heart failure home health orders / Face to face)    Comments:  Heart Failure Follow-up Care:  Verify follow-up appointments per Patient Discharge Instructions. Confirm transportation arranged. Reconcile home medications with discharge medication list. Remove discontinued medications from use. Assist patient/caregiver to manage medications using pill box. Reinforce low sodium food selection Assessments: Vital signs and oxygen saturation at each visit. Assess home environment for safety concerns, caregiver support and availability of low-sodium foods. Consult Education officer, museum, PT/OT, Dietitian, and CNA based on assessments. Perform comprehensive cardiopulmonary assessment. Notify MD for any change in condition or weight gain of 3 pounds in one day or 5 pounds in one week with symptoms. Daily Weights and  Symptom Monitoring: Ensure patient has access to scales. Teach patient/caregiver to weigh daily before breakfast and after voiding using same scale and record.    Teach patient/caregiver to track weight and symptoms and when to notify Provider. Activity: Develop individualized activity plan with patient/caregiver.   Question Answer Comment  Heart Failure Follow-up Care [facility] at [phone]   Obtain the following labs Basic Metabolic Panel   Lab frequency Weekly   Fax lab results to [facility] at [phone]   Diet Low Sodium Heart Healthy   Fluid restrictions: 1200 mL Fluid   Skilled Nurse to notify MD of weight trends weekly for first 2 weeks. May fax or call: [facility] at [phone] (fax) or [phone]      08/31/16 1054      Allergies  Allergen Reactions  . Tape Other (See Comments)    Rips skin-paper tape  . Verapamil Other (See Comments)    Per MAR    Consultations:     Procedures/Studies: Dg Chest 2 View  Result Date: 08/29/2016 CLINICAL DATA:  Awakened today with chest tightness and shortness of breath. History of dizziness over the past week. History of CHF, atrial fibrillation, asthma-COPD, prior CVA EXAM: CHEST  2 VIEW COMPARISON:  PA and lateral chest x-ray dated August 11, 2016 FINDINGS: The lungs  are hyperinflated with hemidiaphragm flattening. There is a small pleural effusion on the right. There is no alveolar infiltrate. The interstitial markings are coarse. The cardiac silhouette is enlarged. The pulmonary vascularity is not engorged. There is calcification in the wall of the aortic arch. There is stable moderate dextrocurvature centered in the mid thoracic spine. There is multilevel degenerative disc space narrowing. IMPRESSION: COPD with chronic interstitial prominence. Cardiomegaly with no definite pulmonary vascular congestion. Small right pleural effusion versus pleural thickening, stable. No definite acute pneumonia. Thoracic  aortic atherosclerosis. Electronically Signed   By: David  Martinique M.D.   On: 08/29/2016 10:14   Dg Chest 2 View  Result Date: 08/11/2016 CLINICAL DATA:  Shortness of Breath, CHF EXAM: CHEST  2 VIEW COMPARISON:  07/06/2016 FINDINGS: Cardiomegaly. Mild interstitial prominence within the lungs, likely early interstitial edema. Small bilateral effusions. No acute bony abnormality. IMPRESSION: Cardiomegaly with increasing interstitial prominence, likely mild interstitial edema. Small effusions. Electronically Signed   By: Rolm Baptise M.D.   On: 08/11/2016 15:02   Ct Chest Wo Contrast  Result Date: 08/29/2016 CLINICAL DATA:  Acute onset of abnormal breath sounds. Initial encounter. EXAM: CT CHEST WITHOUT CONTRAST TECHNIQUE: Multidetector CT imaging of the chest was performed following the standard protocol without IV contrast. COMPARISON:  Chest radiograph performed earlier today at 9:48 a.m. FINDINGS: Cardiovascular: The heart is mildly enlarged. Calcification is noted at the aortic valve, and scattered coronary artery calcifications are seen. Mild calcification is noted along the aortic arch and descending thoracic aorta. Scattered calcification is seen along the proximal great vessels. The thyroid gland is unremarkable. No axillary lymphadenopathy is seen. Mediastinum/Nodes: Scattered prominent right paratracheal nodes measure up to 1.4 cm in short axis. Periaortic nodes measure up to 1.6 cm in short axis. No pericardial effusion is identified. The thyroid gland is unremarkable. No axillary lymphadenopathy is seen. Lungs/Pleura: Trace bilateral pleural fluid is noted, with associated atelectasis. Mild peripheral scarring is seen bilaterally. Scattered bilateral peribronchovascular nodules are seen, measuring up to 1.2 cm at the left upper lobe, with peripheral calcification. This is thought to reflect a remote atypical infectious process. No pneumothorax is identified. No dominant mass is seen. Upper Abdomen:  The visualized portions of the liver and spleen are grossly unremarkable. The visualized portions of the pancreas, adrenal glands and left kidney are within normal limits, aside from small left renal cysts and mild left-sided perinephric stranding. The proximal abdominal aorta is somewhat tortuous, with scattered calcification. Musculoskeletal: No acute osseous abnormalities are identified. Degenerative joint space loss is noted at the right glenohumeral joint. The visualized musculature is unremarkable in appearance. IMPRESSION: 1. Scattered bilateral peribronchovascular nodules, measuring up to 1.2 cm of the left upper lobe, with peripheral calcification. This is thought to reflect a remote atypical infectious process. Would correlate for any signs of acute infection. 2. Trace bilateral pleural fluid, with associated atelectasis. Mild peripheral scarring noted bilaterally. 3. Mild cardiomegaly. Scattered coronary artery calcifications. Calcification at the aortic valve. 4. Prominent right paratracheal and periaortic nodes, measuring up to 1.6 cm in short axis. 5. Small left renal cysts noted. 6. Scattered aortic atherosclerosis, with tortuosity of the proximal abdominal aorta. 7. Degenerative joint space loss at the right glenohumeral joint Electronically Signed   By: Garald Balding M.D.   On: 08/29/2016 21:26    (Echo, Carotid, EGD, Colonoscopy, ERCP)    Subjective: Patient feeling better, no nausea or vomiting, no dyspnea, no chest pain.   Discharge Exam: Vitals:   08/30/16 2116 08/31/16  0356  BP: 137/84 129/71  Pulse: 85 75  Resp: 18 18  Temp: 97.8 F (36.6 C) 98 F (36.7 C)   Vitals:   08/30/16 1311 08/30/16 2116 08/31/16 0356 08/31/16 0847  BP: 127/71 137/84 129/71   Pulse: 81 85 75   Resp: 18 18 18    Temp: 98.3 F (36.8 C) 97.8 F (36.6 C) 98 F (36.7 C)   TempSrc: Oral Oral Oral   SpO2: 97% 95% 94% 97%  Weight:      Height:        General: Pt is alert, awake, not in acute  distress Cardiovascular: RRR, S1/S2 +, no rubs, no gallops. Positive murmur at the left 3dn intercostal space systolic 3/6, radiation to the carotids.  Respiratory: CTA bilaterally, no wheezing, no rhonchi Abdominal: Soft, NT, ND, bowel sounds + Extremities: no edema, no cyanosis    The results of significant diagnostics from this hospitalization (including imaging, microbiology, ancillary and laboratory) are listed below for reference.     Microbiology: No results found for this or any previous visit (from the past 240 hour(s)).   Labs: BNP (last 3 results)  Recent Labs  06/28/16 0717 08/11/16 1529 08/29/16 0947  BNP 230.1* 291.3* [id number]*   Basic Metabolic Panel:  Recent Labs Lab 08/29/16 0924 08/29/16 1817 08/31/16 0217  NA 140  --  140  K 4.1  --  3.9  CL 105  --  105  CO2 27  --  27  GLUCOSE 98  --  101*  BUN 14  --  14  CREATININE 0.86  --  0.81  CALCIUM 8.7*  --  8.9  MG  --  2.1  --   PHOS  --  3.8  --    Liver Function Tests: No results for input(s): AST, ALT, ALKPHOS, BILITOT, PROT, ALBUMIN in the last 168 hours. No results for input(s): LIPASE, AMYLASE in the last 168 hours. No results for input(s): AMMONIA in the last 168 hours. CBC:  Recent Labs Lab 08/29/16 0924  WBC 8.0  HGB 13.5  HCT 41.8  MCV 101.5*  PLT 200   Cardiac Enzymes:  Recent Labs Lab 08/29/16 1552 08/29/16 1817 08/30/16 1507 08/30/16 2033 08/31/16 0217  TROPONINI 0.04* 0.05* 0.04* 0.04* 0.04*   BNP: Invalid input(s): POCBNP CBG: No results for input(s): GLUCAP in the last 168 hours. D-Dimer No results for input(s): DDIMER in the last 72 hours. Hgb A1c No results for input(s): HGBA1C in the last 72 hours. Lipid Profile No results for input(s): CHOL, HDL, LDLCALC, TRIG, CHOLHDL, LDLDIRECT in the last 72 hours. Thyroid function studies No results for input(s): TSH, T4TOTAL, T3FREE, THYROIDAB in the last 72 hours.  Invalid input(s): FREET3 Anemia work up No  results for input(s): VITAMINB12, FOLATE, FERRITIN, TIBC, IRON, RETICCTPCT in the last 72 hours. Urinalysis    Component Value Date/Time   COLORURINE YELLOW 07/06/2016 1752   APPEARANCEUR CLEAR 07/06/2016 1752   LABSPEC 1.020 07/06/2016 1752   PHURINE 6.0 07/06/2016 1752   GLUCOSEU NEGATIVE 07/06/2016 1752   GLUCOSEU NEG mg/dL 10/09/2009 2126   HGBUR NEGATIVE 07/06/2016 1752   BILIRUBINUR NEGATIVE 07/06/2016 1752   KETONESUR NEGATIVE 07/06/2016 1752   PROTEINUR 30 (A) 07/06/2016 1752   UROBILINOGEN 0.2 02/14/2015 0435   NITRITE NEGATIVE 07/06/2016 1752   LEUKOCYTESUR TRACE (A) 07/06/2016 1752   Sepsis Labs Invalid input(s): PROCALCITONIN,  WBC,  LACTICIDVEN Microbiology No results found for this or any previous visit (from the past 240 hour(s)).  Time coordinating discharge:  45 minutes  SIGNED:   Tawni Millers, MD  [facility] 08/31/2016, 10:16 AM Pager   If 7PM-7AM, please contact night-coverage [email or web address] Password [email or web address]

## 2016-09-02 ENCOUNTER — Telehealth: Payer: Self-pay | Admitting: Interventional Cardiology

## 2016-09-02 NOTE — Telephone Encounter (Signed)
The pt is advised that Dr Irish Lack has no openings in his schedule for her to have f/u hosp OV with him within a week. She states that she is willing to see one of our extenders.  She is advised that one of our schedulers will be calling her back to arrange her F/u with an APP. She verbalized understanding.

## 2016-09-02 NOTE — Telephone Encounter (Signed)
New message      Talk to the nurse to give a hosp update.  Pt also states that she needs to have a follow up but want Dr Caroline Myers only.  Please call

## 2016-09-05 ENCOUNTER — Telehealth: Payer: Self-pay

## 2016-09-05 NOTE — Telephone Encounter (Signed)
I have faxed the pts patient assistance form to [facility] for her Eliquis. Awaiting response.

## 2016-09-06 DIAGNOSIS — Z9181 History of falling: Secondary | ICD-10-CM | POA: Diagnosis not present

## 2016-09-06 DIAGNOSIS — S0232XD Fracture of orbital floor, left side, subsequent encounter for fracture with routine healing: Secondary | ICD-10-CM | POA: Diagnosis not present

## 2016-09-06 DIAGNOSIS — M47896 Other spondylosis, lumbar region: Secondary | ICD-10-CM | POA: Diagnosis not present

## 2016-09-06 DIAGNOSIS — M25551 Pain in right hip: Secondary | ICD-10-CM | POA: Diagnosis not present

## 2016-09-06 DIAGNOSIS — S62615D Displaced fracture of proximal phalanx of left ring finger, subsequent encounter for fracture with routine healing: Secondary | ICD-10-CM | POA: Diagnosis not present

## 2016-09-06 DIAGNOSIS — R296 Repeated falls: Secondary | ICD-10-CM | POA: Diagnosis not present

## 2016-09-10 DIAGNOSIS — S62615D Displaced fracture of proximal phalanx of left ring finger, subsequent encounter for fracture with routine healing: Secondary | ICD-10-CM | POA: Diagnosis not present

## 2016-09-10 DIAGNOSIS — S0232XD Fracture of orbital floor, left side, subsequent encounter for fracture with routine healing: Secondary | ICD-10-CM | POA: Diagnosis not present

## 2016-09-10 DIAGNOSIS — M25551 Pain in right hip: Secondary | ICD-10-CM | POA: Diagnosis not present

## 2016-09-10 DIAGNOSIS — Z9181 History of falling: Secondary | ICD-10-CM | POA: Diagnosis not present

## 2016-09-10 DIAGNOSIS — R296 Repeated falls: Secondary | ICD-10-CM | POA: Diagnosis not present

## 2016-09-10 DIAGNOSIS — M47896 Other spondylosis, lumbar region: Secondary | ICD-10-CM | POA: Diagnosis not present

## 2016-09-11 DIAGNOSIS — J45909 Unspecified asthma, uncomplicated: Secondary | ICD-10-CM | POA: Diagnosis not present

## 2016-09-11 DIAGNOSIS — S0232XD Fracture of orbital floor, left side, subsequent encounter for fracture with routine healing: Secondary | ICD-10-CM | POA: Diagnosis not present

## 2016-09-11 DIAGNOSIS — M47896 Other spondylosis, lumbar region: Secondary | ICD-10-CM | POA: Diagnosis not present

## 2016-09-11 DIAGNOSIS — I5022 Chronic systolic (congestive) heart failure: Secondary | ICD-10-CM | POA: Diagnosis not present

## 2016-09-11 DIAGNOSIS — Z9181 History of falling: Secondary | ICD-10-CM | POA: Diagnosis not present

## 2016-09-11 DIAGNOSIS — R079 Chest pain, unspecified: Secondary | ICD-10-CM | POA: Diagnosis not present

## 2016-09-11 DIAGNOSIS — M25551 Pain in right hip: Secondary | ICD-10-CM | POA: Diagnosis not present

## 2016-09-11 DIAGNOSIS — R296 Repeated falls: Secondary | ICD-10-CM | POA: Diagnosis not present

## 2016-09-11 DIAGNOSIS — S62615D Displaced fracture of proximal phalanx of left ring finger, subsequent encounter for fracture with routine healing: Secondary | ICD-10-CM | POA: Diagnosis not present

## 2016-09-11 NOTE — Telephone Encounter (Signed)
Received Pt assistances approval through [employer] on the pts Eliquis . Approval good from 3//9/18-12/31/18.

## 2016-09-12 DIAGNOSIS — M25551 Pain in right hip: Secondary | ICD-10-CM | POA: Diagnosis not present

## 2016-09-12 DIAGNOSIS — S62615D Displaced fracture of proximal phalanx of left ring finger, subsequent encounter for fracture with routine healing: Secondary | ICD-10-CM | POA: Diagnosis not present

## 2016-09-12 DIAGNOSIS — R296 Repeated falls: Secondary | ICD-10-CM | POA: Diagnosis not present

## 2016-09-12 DIAGNOSIS — S0232XD Fracture of orbital floor, left side, subsequent encounter for fracture with routine healing: Secondary | ICD-10-CM | POA: Diagnosis not present

## 2016-09-12 DIAGNOSIS — Z9181 History of falling: Secondary | ICD-10-CM | POA: Diagnosis not present

## 2016-09-12 DIAGNOSIS — M47896 Other spondylosis, lumbar region: Secondary | ICD-10-CM | POA: Diagnosis not present

## 2016-09-13 ENCOUNTER — Ambulatory Visit: Payer: Self-pay | Admitting: Interventional Cardiology

## 2016-09-13 DIAGNOSIS — R296 Repeated falls: Secondary | ICD-10-CM | POA: Diagnosis not present

## 2016-09-13 DIAGNOSIS — M47896 Other spondylosis, lumbar region: Secondary | ICD-10-CM | POA: Diagnosis not present

## 2016-09-13 DIAGNOSIS — S0232XD Fracture of orbital floor, left side, subsequent encounter for fracture with routine healing: Secondary | ICD-10-CM | POA: Diagnosis not present

## 2016-09-13 DIAGNOSIS — M25551 Pain in right hip: Secondary | ICD-10-CM | POA: Diagnosis not present

## 2016-09-13 DIAGNOSIS — Z9181 History of falling: Secondary | ICD-10-CM | POA: Diagnosis not present

## 2016-09-13 DIAGNOSIS — S62615D Displaced fracture of proximal phalanx of left ring finger, subsequent encounter for fracture with routine healing: Secondary | ICD-10-CM | POA: Diagnosis not present

## 2016-09-16 DIAGNOSIS — R296 Repeated falls: Secondary | ICD-10-CM | POA: Diagnosis not present

## 2016-09-16 DIAGNOSIS — S0232XD Fracture of orbital floor, left side, subsequent encounter for fracture with routine healing: Secondary | ICD-10-CM | POA: Diagnosis not present

## 2016-09-16 DIAGNOSIS — Z9181 History of falling: Secondary | ICD-10-CM | POA: Diagnosis not present

## 2016-09-16 DIAGNOSIS — M25551 Pain in right hip: Secondary | ICD-10-CM | POA: Diagnosis not present

## 2016-09-16 DIAGNOSIS — M47896 Other spondylosis, lumbar region: Secondary | ICD-10-CM | POA: Diagnosis not present

## 2016-09-16 DIAGNOSIS — S62615D Displaced fracture of proximal phalanx of left ring finger, subsequent encounter for fracture with routine healing: Secondary | ICD-10-CM | POA: Diagnosis not present

## 2016-09-17 DIAGNOSIS — S0232XD Fracture of orbital floor, left side, subsequent encounter for fracture with routine healing: Secondary | ICD-10-CM | POA: Diagnosis not present

## 2016-09-17 DIAGNOSIS — M47896 Other spondylosis, lumbar region: Secondary | ICD-10-CM | POA: Diagnosis not present

## 2016-09-17 DIAGNOSIS — Z9181 History of falling: Secondary | ICD-10-CM | POA: Diagnosis not present

## 2016-09-17 DIAGNOSIS — S62615D Displaced fracture of proximal phalanx of left ring finger, subsequent encounter for fracture with routine healing: Secondary | ICD-10-CM | POA: Diagnosis not present

## 2016-09-17 DIAGNOSIS — M25551 Pain in right hip: Secondary | ICD-10-CM | POA: Diagnosis not present

## 2016-09-17 DIAGNOSIS — R296 Repeated falls: Secondary | ICD-10-CM | POA: Diagnosis not present

## 2016-09-18 DIAGNOSIS — S62615D Displaced fracture of proximal phalanx of left ring finger, subsequent encounter for fracture with routine healing: Secondary | ICD-10-CM | POA: Diagnosis not present

## 2016-09-18 DIAGNOSIS — Z9181 History of falling: Secondary | ICD-10-CM | POA: Diagnosis not present

## 2016-09-18 DIAGNOSIS — R296 Repeated falls: Secondary | ICD-10-CM | POA: Diagnosis not present

## 2016-09-18 DIAGNOSIS — S0232XD Fracture of orbital floor, left side, subsequent encounter for fracture with routine healing: Secondary | ICD-10-CM | POA: Diagnosis not present

## 2016-09-18 DIAGNOSIS — M25551 Pain in right hip: Secondary | ICD-10-CM | POA: Diagnosis not present

## 2016-09-18 DIAGNOSIS — R0602 Shortness of breath: Secondary | ICD-10-CM | POA: Diagnosis not present

## 2016-09-18 DIAGNOSIS — M47896 Other spondylosis, lumbar region: Secondary | ICD-10-CM | POA: Diagnosis not present

## 2016-09-19 ENCOUNTER — Ambulatory Visit (INDEPENDENT_AMBULATORY_CARE_PROVIDER_SITE_OTHER): Payer: Medicare Other | Admitting: Physician Assistant

## 2016-09-19 ENCOUNTER — Encounter: Payer: Self-pay | Admitting: Physician Assistant

## 2016-09-19 VITALS — BP 118/94 | HR 74 | Ht 65.0 in | Wt 132.0 lb

## 2016-09-19 DIAGNOSIS — I482 Chronic atrial fibrillation: Secondary | ICD-10-CM

## 2016-09-19 DIAGNOSIS — M47896 Other spondylosis, lumbar region: Secondary | ICD-10-CM | POA: Diagnosis not present

## 2016-09-19 DIAGNOSIS — I429 Cardiomyopathy, unspecified: Secondary | ICD-10-CM

## 2016-09-19 DIAGNOSIS — I4892 Unspecified atrial flutter: Secondary | ICD-10-CM

## 2016-09-19 DIAGNOSIS — I350 Nonrheumatic aortic (valve) stenosis: Secondary | ICD-10-CM

## 2016-09-19 DIAGNOSIS — M25551 Pain in right hip: Secondary | ICD-10-CM | POA: Diagnosis not present

## 2016-09-19 DIAGNOSIS — I4821 Permanent atrial fibrillation: Secondary | ICD-10-CM

## 2016-09-19 DIAGNOSIS — I10 Essential (primary) hypertension: Secondary | ICD-10-CM | POA: Diagnosis not present

## 2016-09-19 DIAGNOSIS — S62615D Displaced fracture of proximal phalanx of left ring finger, subsequent encounter for fracture with routine healing: Secondary | ICD-10-CM | POA: Diagnosis not present

## 2016-09-19 DIAGNOSIS — Z9181 History of falling: Secondary | ICD-10-CM | POA: Diagnosis not present

## 2016-09-19 DIAGNOSIS — R296 Repeated falls: Secondary | ICD-10-CM | POA: Diagnosis not present

## 2016-09-19 DIAGNOSIS — I5042 Chronic combined systolic (congestive) and diastolic (congestive) heart failure: Secondary | ICD-10-CM | POA: Diagnosis not present

## 2016-09-19 DIAGNOSIS — S0232XD Fracture of orbital floor, left side, subsequent encounter for fracture with routine healing: Secondary | ICD-10-CM | POA: Diagnosis not present

## 2016-09-19 MED ORDER — APIXABAN 2.5 MG PO TABS: 2.5000 mg | ORAL_TABLET | Freq: Two times a day (BID) | ORAL | 11 refills | Status: DC

## 2016-09-19 NOTE — Progress Notes (Signed)
Cardiology Office Note    Date:  09/19/2016  ID:  Caroline Myers, DOB [birth year], MRN [id number] PCP:  Caroline Crutch, MD  Cardiologist:  Caroline. Irish Myers   Chief Complaint: f/u abnormal echo  History of Present Illness:  Caroline Myers is a 83 y.o. female with history of permanent atrial flutter vs coarse atrial fib, aortic stenosis, CVA x2, normal cors 10/2006 (per Caroline. Hassell Myers note), HTN, chronic combined CHF, prior intracranial hemorrhage following injury, scoliosis, pulmonary HTN, COPD, anxiety who presents for post-hospital follow-up. In 2017 she had interaction with the medical community for intermittent CHF. More recently she was admitted earlier this month with chest pressure and increased WOB. 2D Echo 08/30/16: severe LVH, EF 35-40%, diffuse HK, severe aortic stenosis, mild AI/MR, severe LAE, mod RAE, PASP 59 (prev EF 50-55% in 07/2015 with mild AS at that time). Peak troponin 0.05, Mg 2.1, Cr 0.81, K 3.9, Hgb 13.9. Notes indicate some intermittent confusion during that stay.  She presents back to clinic with her daughter. She reports progressive DOE over the last 2 years as well. She also has had intermittent dizziness when she stands up. Her husband died in 07-24-23 so she moved into an assisted living facility. She gets SOB when walking to the dining hall and has to stop and rest. She does not use any assistive devices. She has not had any more chest pain. She has been losing weight slowly. She and her daughter recently discussed her care with palliative care and are still working through what her goals of care are. She has not had any more falls recently.   Past Medical History:  Diagnosis Date  . Allergic rhinitis   . Anxiety   . Arthritis    "neck, spine, hands" (06/28/2016)  . Asthma 2000   mild. tried Advair after bronchospasm after exposure to cats  . Atrial flutter ([place])   . Chronic combined systolic and diastolic CHF (congestive heart failure) ([place])   . COPD (chronic  obstructive pulmonary disease) ([place])   . GERD (gastroesophageal reflux disease)    occ  . Intracranial hemorrhage following injury ([place]) 01/03/2015  . Normal coronary arteries    a. underwent treadmill stress test in April 2008 which shows normal perfusion study, however she had chest pain and EKG changes during the terminal portion concerning for false negative study. She had normal coronaries on cardiac catheterization in May 2008.   Marland Kitchen Permanent atrial fibrillation (HCC)    pt does not want anticoagulation  . Pneumonia 07/2015  . Scoliosis   . Severe aortic stenosis   . Shoulder pain, left 23-Jul-2010   with rotator cuff tear--Caroline Myers  . Skin cancer of nose    "MOHS; think it was basal"  . Squamous carcinoma    "burned off my face"  . Stroke [facility]) 1/ 2014; 12/2014   denies residual on 06/28/2016    Past Surgical History:  Procedure Laterality Date  . BACK SURGERY    . CATARACT EXTRACTION W/ INTRAOCULAR LENS  IMPLANT, BILATERAL Bilateral   . DILATION AND CURETTAGE OF UTERUS    . HERNIA REPAIR  2003  . LUMBAR LAMINECTOMY/DECOMPRESSION MICRODISCECTOMY  04/01/2012   Procedure: LUMBAR LAMINECTOMY/DECOMPRESSION MICRODISCECTOMY 1 LEVEL;  Surgeon: Caroline Moore, MD;  Location: [facility];  Service: Neurosurgery;  Laterality: Right;  Right Lumbar four-five extraforaminal microdiskectomy  . MOHS SURGERY     "nose"  . SHOULDER ARTHROSCOPY Right 2000   ACROMIOPLASTY  . TONSILLECTOMY  Current Medications: Current Outpatient Prescriptions  Medication Sig Dispense Refill  . acetaminophen (TYLENOL) 500 MG tablet Take 1 tablet (500 mg total) by mouth every 6 (six) hours as needed. 30 tablet 0  . albuterol (PROVENTIL) (2.5 MG/3ML) 0.083% nebulizer solution Take 3 mLs (2.5 mg total) by nebulization every 4 (four) hours as needed for wheezing or shortness of breath. 75 mL 0  . ALPRAZolam (XANAX) 0.25 MG tablet Take 0.25 mg by mouth daily as needed for anxiety.    Marland Kitchen apixaban (ELIQUIS) 5 MG  TABS tablet Take 1 tablet (5 mg total) by mouth 2 (two) times daily. 180 tablet 3  . b complex vitamins tablet Take 1 tablet by mouth daily.    . furosemide (LASIX) 20 MG tablet Take 1 tablet (20 mg total) by mouth daily. 90 tablet 3  . potassium chloride (K-DUR) 10 MEQ tablet Take 1 tablet (10 mEq total) by mouth daily. 90 tablet 3  . PROAIR HFA 108 (90 Base) MCG/ACT inhaler Inhale 2 puffs into the lungs every 4 (four) hours as needed for wheezing or shortness of breath. 18 g 0  . sertraline (ZOLOFT) 50 MG tablet Take 1 tablet (50 mg total) by mouth daily. 30 tablet 4  . SYMBICORT 160-4.5 MCG/ACT inhaler Inhale 2 puffs into the lungs 2 (two) times daily.  5   No current facility-administered medications for this visit.      Allergies:   Tape and Verapamil   Social History   Social History  . Marital status: Widowed    Spouse name: Caroline Myers  . Number of children: 2  . Years of education: 12th   Occupational History  . RETIRED Retired   Social History Main Topics  . Smoking status: Never Smoker  . Smokeless tobacco: Never Used  . Alcohol use 0.0 oz/week     Comment: 06/28/2016 "moved to assisted living 06/18/2016; nothing since then"  . Drug use: No  . Sexual activity: No   Other Topics Concern  . None   Social History Narrative   Patient is married with 2 children.   Patient is right handed.   Patient has hs education.   Patient drinks 1 cup daily.     Family History:  Family History  Problem Relation Age of Onset  . Stroke Sister   . Heart attack Neg Hx   . Hypertension Neg Hx     ROS:   Please see the history of present illness. + redness of left eyelid, seeing opthalmology on Monday All other systems are reviewed and otherwise negative.    PHYSICAL EXAM:   VS:  BP (!) 118/94   Pulse 74   Ht 5\' 5"  (1.651 m)   Wt 132 lb (59.9 kg)   SpO2 94%   BMI 21.97 kg/m   BMI: Body mass index is 21.97 kg/m. GEN: Well nourished, well developed thin WF, in no acute  distress  HEENT: normocephalic, atraumatic Neck: no JVD, carotid bruits, or masses Cardiac: irregularly irregular; 2/6 SEM at RUSB. No rubs or gallops, no edema Respiratory:  clear to auscultation bilaterally, normal work of breathing GI: soft, nontender, nondistended, + BS MS: no deformity or atrophy  Skin: warm and dry, no rash Neuro:  Alert and Oriented x 3, Strength and sensation are intact, follows commands Psych: euthymic mood, full affect  Wt Readings from Last 3 Encounters:  09/19/16 132 lb (59.9 kg)  08/29/16 131 lb (59.4 kg)  08/21/16 134 lb 12.8 oz (61.1 kg)  Studies/Labs Reviewed:   EKG:  EKG was ordered today and personally reviewed by me and demonstrates coarse atrial fib/flutter similar to before, 75bpm, no acute changes.  Recent Labs: 07/06/2016: ALT 21 08/29/2016: B Natriuretic Peptide 214.0; Hemoglobin 13.5; Magnesium 2.1; Platelets 200 08/31/2016: BUN 14; Creatinine, Ser 0.81; Potassium 3.9; Sodium 140   Lipid Panel    Component Value Date/Time   CHOL 199 02/15/2015 0054   TRIG 101 02/15/2015 0054   HDL 42 02/15/2015 0054   CHOLHDL 4.7 02/15/2015 0054   VLDL 20 02/15/2015 0054   LDLCALC 137 (H) 02/15/2015 0054    Additional studies/ records that were reviewed today include: Summarized above.    ASSESSMENT & PLAN:   1. Severe aortic stenosis - long conversation with patient and her daughter about aortic stenosis and its natural progression. We discussed options including conservative care versus TAVR versus SAVR. I do not think she would be a candidate for traditional SAVR and she is not interested in this option. She would be interested in speaking with Caroline. McAlhany/Cooper further about possibility of TAVR. She is still fairly functional and may benefit from this. She has not completely committed herself to palliative care yet. Will refer her to further this discussion. I told her if she changes her mind and does not wish to pursue any intervention to  please call us at which time I'd recommend a f/u with Caroline. Irish Myers in 3 months.   2. Chronic combined CHF with new cardiomyopathy - I suspect worsening EF is due to her aortic stenosis. Her blood pressure and h/o orthostatic-type symptoms and falls prohibits further med titration at this time. She appears euvolemic. 3. Permanent atrial fib/flutter - rate is currently controlled. Weight has dropped below 60kg. Will decrease Eliquis dose to 2.5mg  BID. 4. Hypertension - currently controlled, will not make any changes at this time.  Disposition: F/u with Caroline. Leeroy Bock to further discuss possibility of TAVR.   Medication Adjustments/Labs and Tests Ordered: Current medicines are reviewed at length with the patient today.  Concerns regarding medicines are outlined above. Medication changes, Labs and Tests ordered today are summarized above and listed in the Patient Instructions accessible in Encounters.   Raechel Ache PA-C  09/19/2016 1:49 PM    [facility] [place], [place], [place]  [address] Phone: [phone]; Fax: [phone]

## 2016-09-19 NOTE — Patient Instructions (Signed)
Medication Instructions:  Your physician has recommended you make the following change in your medication: we are decreasing your Eliquis to 2.5 mg twice a day.    Labwork: None Ordered   Testing/Procedures: None Ordered   Follow-Up: Your physician recommends that you schedule a follow-up appointment in: please schedule a follow up appointment with Dr. Burt Knack or Dr. Angelena Form to discuss the TAVR. Transcatheter Aortic Valve Replacement    Any Other Special Instructions Will Be Listed Below (If Applicable).     If you need a refill on your cardiac medications before your next appointment, please call your pharmacy.

## 2016-09-23 DIAGNOSIS — H02104 Unspecified ectropion of left upper eyelid: Secondary | ICD-10-CM | POA: Diagnosis not present

## 2016-09-23 DIAGNOSIS — H02105 Unspecified ectropion of left lower eyelid: Secondary | ICD-10-CM | POA: Diagnosis not present

## 2016-09-24 DIAGNOSIS — R0602 Shortness of breath: Secondary | ICD-10-CM | POA: Diagnosis not present

## 2016-09-25 DIAGNOSIS — M25551 Pain in right hip: Secondary | ICD-10-CM | POA: Diagnosis not present

## 2016-09-25 DIAGNOSIS — S62615D Displaced fracture of proximal phalanx of left ring finger, subsequent encounter for fracture with routine healing: Secondary | ICD-10-CM | POA: Diagnosis not present

## 2016-09-25 DIAGNOSIS — S0232XD Fracture of orbital floor, left side, subsequent encounter for fracture with routine healing: Secondary | ICD-10-CM | POA: Diagnosis not present

## 2016-09-25 DIAGNOSIS — Z9181 History of falling: Secondary | ICD-10-CM | POA: Diagnosis not present

## 2016-09-25 DIAGNOSIS — R296 Repeated falls: Secondary | ICD-10-CM | POA: Diagnosis not present

## 2016-09-25 DIAGNOSIS — M47896 Other spondylosis, lumbar region: Secondary | ICD-10-CM | POA: Diagnosis not present

## 2016-09-27 ENCOUNTER — Encounter: Payer: Self-pay | Admitting: Cardiovascular Disease

## 2016-09-27 ENCOUNTER — Ambulatory Visit (INDEPENDENT_AMBULATORY_CARE_PROVIDER_SITE_OTHER): Payer: Medicare Other | Admitting: Cardiovascular Disease

## 2016-09-27 VITALS — BP 100/62 | HR 75 | Ht 65.0 in | Wt 136.0 lb

## 2016-09-27 DIAGNOSIS — S62615D Displaced fracture of proximal phalanx of left ring finger, subsequent encounter for fracture with routine healing: Secondary | ICD-10-CM | POA: Diagnosis not present

## 2016-09-27 DIAGNOSIS — I350 Nonrheumatic aortic (valve) stenosis: Secondary | ICD-10-CM | POA: Diagnosis not present

## 2016-09-27 DIAGNOSIS — I5042 Chronic combined systolic (congestive) and diastolic (congestive) heart failure: Secondary | ICD-10-CM

## 2016-09-27 DIAGNOSIS — R296 Repeated falls: Secondary | ICD-10-CM | POA: Diagnosis not present

## 2016-09-27 DIAGNOSIS — M47896 Other spondylosis, lumbar region: Secondary | ICD-10-CM | POA: Diagnosis not present

## 2016-09-27 DIAGNOSIS — Z9181 History of falling: Secondary | ICD-10-CM | POA: Diagnosis not present

## 2016-09-27 DIAGNOSIS — S0232XD Fracture of orbital floor, left side, subsequent encounter for fracture with routine healing: Secondary | ICD-10-CM | POA: Diagnosis not present

## 2016-09-27 DIAGNOSIS — M25551 Pain in right hip: Secondary | ICD-10-CM | POA: Diagnosis not present

## 2016-09-27 NOTE — Patient Instructions (Addendum)
Medication Instructions:  Your physician recommends that you continue on your current medications as directed. Please refer to the Current Medication list given to you today. Take one extra potassium and one extra lasix tablet for the next 3 days.   Labwork: none  Testing/Procedures: Your physician has requested that you have a dobutamine echocardiogram. For further information please visit [email or web address]. Please follow instruction sheet as given.   Follow-Up: Your physician recommends that you schedule a follow-up appointment in: about 1 month with Dr. Angelena Form. Scheduled for April 27,2018 at 10:40    Any Other Special Instructions Will Be Listed Below (If Applicable).     If you need a refill on your cardiac medications before your next appointment, please call your pharmacy.

## 2016-09-27 NOTE — Progress Notes (Signed)
Chief Complaint  Patient presents with  . New Patient (Initial Visit)    AS-discuss TAVR   History of Present Illness:83 yo female with history of permanent atrial flutter on Eliquis, severe aortic stenosis, prior CVA x 2, chronic combined CHF, prior ICH (traumatic), pulmonary HTN, COPD and anxiety who is here today as a new patient to discuss her aortic valve stenosis. She is followed by Dr. Irish Myers. She was admitted earlier this month with chest pain and dyspnea. She was found to be volume overloaded and was diuresed. This was her third admission in 3 months with CHF. Echo 08/30/16 with LVEF=35-40%, severe LVH, severe aortic stenosis with AVA 0.92. Mean gradient of only 9 mm Hg across the valve. She has a history of CVA x 2. She has fallen in July 2016 and had an ICH.   She has been limited over the last few months with dyspnea on exertion. No chest pain. No LE edema. No dizziness, near syncope or syncope. 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 Her husband passed away in 2016-07-13. He was 91. She lives at the [facility]. Her daughter is with her today.   Primary Care Physician: Caroline Crutch, MD Primary Cardiologist: Dr. Irish Myers Referring:Caroline Myers Reason for referral: Severe aortic valve stenosis   Past Medical History:  Diagnosis Date  . Allergic rhinitis   . Anxiety   . Arthritis    "neck, spine, hands" (06/28/2016)  . Asthma 2000   mild. tried Advair after bronchospasm after exposure to cats  . Atrial flutter ([place])   . Chronic combined systolic and diastolic CHF (congestive heart failure) ([place])   . COPD (chronic obstructive pulmonary disease) ([place])   . GERD (gastroesophageal reflux disease)    occ  . Intracranial hemorrhage following injury ([place]) 01/03/2015  . Normal coronary arteries    a. underwent treadmill stress test in April 2008 which shows normal perfusion study, however she had chest pain and EKG changes during the terminal portion  concerning for false negative study. She had normal coronaries on cardiac catheterization in May 2008.   Marland Kitchen Permanent atrial fibrillation (HCC)    pt does not want anticoagulation  . Pneumonia 07/2015  . Scoliosis   . Severe aortic stenosis   . Shoulder pain, left 07/13/10   with rotator cuff tear--Dr Caroline Myers  . Skin cancer of nose    "MOHS; think it was basal"  . Squamous carcinoma    "burned off my face"  . Stroke [facility]) 1/ 2014; 12/2014   denies residual on 06/28/2016    Past Surgical History:  Procedure Laterality Date  . CATARACT EXTRACTION W/ INTRAOCULAR LENS  IMPLANT, BILATERAL Bilateral   . DILATION AND CURETTAGE OF UTERUS    . HERNIA REPAIR  2003  . LUMBAR LAMINECTOMY/DECOMPRESSION MICRODISCECTOMY  04/01/2012   Procedure: LUMBAR LAMINECTOMY/DECOMPRESSION MICRODISCECTOMY 1 LEVEL;  Surgeon: Caroline Moore, MD;  Location: [facility];  Service: Neurosurgery;  Laterality: Right;  Right Lumbar four-five extraforaminal microdiskectomy  . MOHS SURGERY     "nose"  . SHOULDER ARTHROSCOPY Right 2000   ACROMIOPLASTY  . TONSILLECTOMY      Current Outpatient Prescriptions  Medication Sig Dispense Refill  . acetaminophen (TYLENOL) 500 MG tablet Take 1 tablet (500 mg total) by mouth every 6 (six) hours as needed. 30 tablet 0  . ALPRAZolam (XANAX) 0.25 MG tablet Take 5 mg by mouth daily as needed for anxiety.     Marland Kitchen apixaban (ELIQUIS) 5  MG TABS tablet Take 1 tablet (5 mg total) by mouth 2 (two) times daily. 180 tablet 3  . b complex vitamins tablet Take 1 tablet by mouth daily.    . furosemide (LASIX) 20 MG tablet Take 1 tablet (20 mg total) by mouth daily. 90 tablet 3  . potassium chloride (K-DUR) 10 MEQ tablet Take 1 tablet (10 mEq total) by mouth daily. 90 tablet 3  . PROAIR HFA 108 (90 Base) MCG/ACT inhaler Inhale 2 puffs into the lungs every 4 (four) hours as needed for wheezing or shortness of breath. 18 g 0  . sertraline (ZOLOFT) 50 MG tablet Take 1 tablet (50 mg total) by mouth  daily. 30 tablet 4  . SYMBICORT 160-4.5 MCG/ACT inhaler Inhale 2 puffs into the lungs 2 (two) times daily.  5   No current facility-administered medications for this visit.     Allergies  Allergen Reactions  . Tape Other (See Comments)    Rips skin-paper tape  . Verapamil Other (See Comments)    Per MAR    Social History   Social History  . Marital status: Widowed    Spouse name: Caroline Myers  . Number of children: 2  . Years of education: 12th   Occupational History  . RETIRED homemaker Retired   Social History Main Topics  . Smoking status: Never Smoker  . Smokeless tobacco: Never Used  . Alcohol use 0.0 oz/week     Comment: 06/28/2016 "moved to assisted living 06/18/2016; nothing since then"  . Drug use: No  . Sexual activity: No   Other Topics Concern  . Not on file   Social History Narrative   Patient is married with 2 children.   Patient is right handed.   Patient has hs education.   Patient drinks 1 cup daily.    Family History  Problem Relation Age of Onset  . Heart failure Father   . Stroke Sister   . Heart attack Neg Hx   . Hypertension Neg Hx     Review of Systems:  As stated in the HPI and otherwise negative.   BP 100/62   Pulse 75   Ht 5' 5"  (1.651 m)   Wt 136 lb (61.7 kg)   SpO2 94%   BMI 22.63 kg/m   Physical Examination: General: Well developed, well nourished, NAD  HEENT: OP clear, mucus membranes moist  SKIN: warm, dry. No rashes. Neuro: No focal deficits  Musculoskeletal: Muscle strength 5/5 all ext  Psychiatric: Mood and affect normal  Neck: No JVD, no carotid bruits, no thyromegaly, no lymphadenopathy.  Lungs:Clear bilaterally, no wheezes, rhonci, crackles Cardiovascular: Regular rate and rhythm. Loud harsh systolic murmur. No gallops or rubs. Abdomen:Soft. Bowel sounds present. Non-tender.  Extremities: No lower extremity edema. Pulses are 2 + in the bilateral DP/PT.  Echo 08/30/16: Left ventricle: The cavity size was normal.  Wall thickness was   increased in a pattern of severe LVH. Systolic function was   moderately reduced. The estimated ejection fraction was in the   range of 35% to 40%. Diffuse hypokinesis. - Aortic valve: Valve mobility was restricted. There was severe   stenosis. There was mild regurgitation. Valve area (VTI): 0.92   cm^2. Valve area (Vmax): 0.76 cm^2. Valve area (Vmean): 0.73   cm^2. - Mitral valve: There was mild regurgitation. - Left atrium: The atrium was severely dilated. - Right atrium: The atrium was moderately dilated. - Pulmonary arteries: Systolic pressure was moderately increased.   PA peak pressure: 59  mm Hg (S). - Pericardium, extracardiac: A trivial pericardial effusion was   identified.  Impressions:  - Moderate global reduction in LV systolic function; severe LVH;   calcified aortic valve with severe AS by continuity equation but   may be overestimated; mild AI; biatrial enlargement; mild MR;   mild TR with moderately elevated pulmonary pressure.   EKG:  EKG is not ordered today. I have reviewed the echo from 09/19/16 which shows atrial flutter with rate of 75 bpm  Recent Labs: 07/06/2016: ALT 21 08/29/2016: B Natriuretic Peptide 214.0; Hemoglobin 13.5; Magnesium 2.1; Platelets 200 08/31/2016: BUN 14; Creatinine, Ser 0.81; Potassium 3.9; Sodium 140    Wt Readings from Last 3 Encounters:  09/27/16 136 lb (61.7 kg)  09/19/16 132 lb (59.9 kg)  08/29/16 131 lb (59.4 kg)    Other studies Reviewed: Additional studies/ records that were reviewed today include: I have reviewed her echo images. Review of the above records demonstrates: thickened AV with limited leaflet mobility  Assessment and Plan:  1. Severe aortic stenosis: She has stage D symptomatic aortic valve stenosis. I have personally reviewed the echo images. The aortic valve is thickened, calcified with limited leaflet mobility. She has low gradients across the valve but this is likely due to low output. I  have discussed the TAVR procedure in detail with the patient and her daughter today. She is not sure she wishes to proceed. I think we need to complete a dobutamine stress echo before we move forward anyway to see if her gradients increase. If there is evidence of severe AS, then I think  would benefit from AVR. Given advanced age, she is not a good candidate for conventional AVR by surgical approach. I think she may be a good candidate for TAVR. She is functional although she does not do much on a daily basis. She has met with palliative care last week to discuss end of life issues.   I will see her back in one month to review the dobutamine echo and we will discuss TAVR further at that time.   2. Chronic combined systolic and diastolic CHF: Weight is up 4 lbs in last 8 days. Will have her increase Lasix to 40 mg daily for 3 days and follow daily weights.      Current medicines are reviewed at length with the patient today.  The patient does not have concerns regarding medicines.  The following changes have been made:  no change  Labs/ tests ordered today include:   Orders Placed This Encounter  Procedures  . ECHOCARDIOGRAM STRESS TEST     Disposition:   FU with me in 4 weeks   Signed, Lauree Chandler, MD 09/27/2016 4:43 PM    [facility] [place], [place], [place]  [address] Phone: [phone]; Fax: [phone]

## 2016-09-30 DIAGNOSIS — S62615D Displaced fracture of proximal phalanx of left ring finger, subsequent encounter for fracture with routine healing: Secondary | ICD-10-CM | POA: Diagnosis not present

## 2016-09-30 DIAGNOSIS — S0232XD Fracture of orbital floor, left side, subsequent encounter for fracture with routine healing: Secondary | ICD-10-CM | POA: Diagnosis not present

## 2016-09-30 DIAGNOSIS — M25551 Pain in right hip: Secondary | ICD-10-CM | POA: Diagnosis not present

## 2016-09-30 DIAGNOSIS — Z9181 History of falling: Secondary | ICD-10-CM | POA: Diagnosis not present

## 2016-09-30 DIAGNOSIS — M47896 Other spondylosis, lumbar region: Secondary | ICD-10-CM | POA: Diagnosis not present

## 2016-09-30 DIAGNOSIS — R296 Repeated falls: Secondary | ICD-10-CM | POA: Diagnosis not present

## 2016-10-01 DIAGNOSIS — S0232XD Fracture of orbital floor, left side, subsequent encounter for fracture with routine healing: Secondary | ICD-10-CM | POA: Diagnosis not present

## 2016-10-01 DIAGNOSIS — S62615D Displaced fracture of proximal phalanx of left ring finger, subsequent encounter for fracture with routine healing: Secondary | ICD-10-CM | POA: Diagnosis not present

## 2016-10-01 DIAGNOSIS — Z9181 History of falling: Secondary | ICD-10-CM | POA: Diagnosis not present

## 2016-10-01 DIAGNOSIS — M47896 Other spondylosis, lumbar region: Secondary | ICD-10-CM | POA: Diagnosis not present

## 2016-10-01 DIAGNOSIS — R296 Repeated falls: Secondary | ICD-10-CM | POA: Diagnosis not present

## 2016-10-01 DIAGNOSIS — M25551 Pain in right hip: Secondary | ICD-10-CM | POA: Diagnosis not present

## 2016-10-02 DIAGNOSIS — J449 Chronic obstructive pulmonary disease, unspecified: Secondary | ICD-10-CM | POA: Diagnosis not present

## 2016-10-02 DIAGNOSIS — I509 Heart failure, unspecified: Secondary | ICD-10-CM | POA: Diagnosis not present

## 2016-10-02 DIAGNOSIS — F419 Anxiety disorder, unspecified: Secondary | ICD-10-CM | POA: Diagnosis not present

## 2016-10-02 DIAGNOSIS — I4891 Unspecified atrial fibrillation: Secondary | ICD-10-CM | POA: Diagnosis not present

## 2016-10-02 DIAGNOSIS — K219 Gastro-esophageal reflux disease without esophagitis: Secondary | ICD-10-CM | POA: Diagnosis not present

## 2016-10-03 DIAGNOSIS — Z9181 History of falling: Secondary | ICD-10-CM | POA: Diagnosis not present

## 2016-10-03 DIAGNOSIS — M47896 Other spondylosis, lumbar region: Secondary | ICD-10-CM | POA: Diagnosis not present

## 2016-10-03 DIAGNOSIS — R296 Repeated falls: Secondary | ICD-10-CM | POA: Diagnosis not present

## 2016-10-03 DIAGNOSIS — S0232XD Fracture of orbital floor, left side, subsequent encounter for fracture with routine healing: Secondary | ICD-10-CM | POA: Diagnosis not present

## 2016-10-03 DIAGNOSIS — M25551 Pain in right hip: Secondary | ICD-10-CM | POA: Diagnosis not present

## 2016-10-03 DIAGNOSIS — S62615D Displaced fracture of proximal phalanx of left ring finger, subsequent encounter for fracture with routine healing: Secondary | ICD-10-CM | POA: Diagnosis not present

## 2016-10-04 DIAGNOSIS — R296 Repeated falls: Secondary | ICD-10-CM | POA: Diagnosis not present

## 2016-10-04 DIAGNOSIS — I480 Paroxysmal atrial fibrillation: Secondary | ICD-10-CM | POA: Diagnosis not present

## 2016-10-04 DIAGNOSIS — S62615D Displaced fracture of proximal phalanx of left ring finger, subsequent encounter for fracture with routine healing: Secondary | ICD-10-CM | POA: Diagnosis not present

## 2016-10-04 DIAGNOSIS — S0232XD Fracture of orbital floor, left side, subsequent encounter for fracture with routine healing: Secondary | ICD-10-CM | POA: Diagnosis not present

## 2016-10-04 DIAGNOSIS — I5032 Chronic diastolic (congestive) heart failure: Secondary | ICD-10-CM | POA: Diagnosis not present

## 2016-10-04 DIAGNOSIS — I110 Hypertensive heart disease with heart failure: Secondary | ICD-10-CM | POA: Diagnosis not present

## 2016-10-06 DIAGNOSIS — R05 Cough: Secondary | ICD-10-CM | POA: Diagnosis not present

## 2016-10-06 DIAGNOSIS — R062 Wheezing: Secondary | ICD-10-CM | POA: Diagnosis not present

## 2016-10-06 DIAGNOSIS — R0602 Shortness of breath: Secondary | ICD-10-CM | POA: Diagnosis not present

## 2016-10-07 DIAGNOSIS — S62615D Displaced fracture of proximal phalanx of left ring finger, subsequent encounter for fracture with routine healing: Secondary | ICD-10-CM | POA: Diagnosis not present

## 2016-10-07 DIAGNOSIS — S0232XD Fracture of orbital floor, left side, subsequent encounter for fracture with routine healing: Secondary | ICD-10-CM | POA: Diagnosis not present

## 2016-10-07 DIAGNOSIS — R296 Repeated falls: Secondary | ICD-10-CM | POA: Diagnosis not present

## 2016-10-07 DIAGNOSIS — I110 Hypertensive heart disease with heart failure: Secondary | ICD-10-CM | POA: Diagnosis not present

## 2016-10-07 DIAGNOSIS — I480 Paroxysmal atrial fibrillation: Secondary | ICD-10-CM | POA: Diagnosis not present

## 2016-10-07 DIAGNOSIS — I5032 Chronic diastolic (congestive) heart failure: Secondary | ICD-10-CM | POA: Diagnosis not present

## 2016-10-08 DIAGNOSIS — R296 Repeated falls: Secondary | ICD-10-CM | POA: Diagnosis not present

## 2016-10-08 DIAGNOSIS — S62615D Displaced fracture of proximal phalanx of left ring finger, subsequent encounter for fracture with routine healing: Secondary | ICD-10-CM | POA: Diagnosis not present

## 2016-10-08 DIAGNOSIS — S0232XD Fracture of orbital floor, left side, subsequent encounter for fracture with routine healing: Secondary | ICD-10-CM | POA: Diagnosis not present

## 2016-10-08 DIAGNOSIS — I480 Paroxysmal atrial fibrillation: Secondary | ICD-10-CM | POA: Diagnosis not present

## 2016-10-08 DIAGNOSIS — J449 Chronic obstructive pulmonary disease, unspecified: Secondary | ICD-10-CM | POA: Diagnosis not present

## 2016-10-08 DIAGNOSIS — I509 Heart failure, unspecified: Secondary | ICD-10-CM | POA: Diagnosis not present

## 2016-10-08 DIAGNOSIS — K219 Gastro-esophageal reflux disease without esophagitis: Secondary | ICD-10-CM | POA: Diagnosis not present

## 2016-10-08 DIAGNOSIS — I110 Hypertensive heart disease with heart failure: Secondary | ICD-10-CM | POA: Diagnosis not present

## 2016-10-08 DIAGNOSIS — I5032 Chronic diastolic (congestive) heart failure: Secondary | ICD-10-CM | POA: Diagnosis not present

## 2016-10-09 DIAGNOSIS — I509 Heart failure, unspecified: Secondary | ICD-10-CM | POA: Diagnosis not present

## 2016-10-09 DIAGNOSIS — F419 Anxiety disorder, unspecified: Secondary | ICD-10-CM | POA: Diagnosis not present

## 2016-10-09 DIAGNOSIS — Z79899 Other long term (current) drug therapy: Secondary | ICD-10-CM | POA: Diagnosis not present

## 2016-10-09 DIAGNOSIS — Z66 Do not resuscitate: Secondary | ICD-10-CM | POA: Diagnosis not present

## 2016-10-09 DIAGNOSIS — I480 Paroxysmal atrial fibrillation: Secondary | ICD-10-CM | POA: Diagnosis not present

## 2016-10-09 DIAGNOSIS — S62615D Displaced fracture of proximal phalanx of left ring finger, subsequent encounter for fracture with routine healing: Secondary | ICD-10-CM | POA: Diagnosis not present

## 2016-10-09 DIAGNOSIS — I110 Hypertensive heart disease with heart failure: Secondary | ICD-10-CM | POA: Diagnosis not present

## 2016-10-09 DIAGNOSIS — J449 Chronic obstructive pulmonary disease, unspecified: Secondary | ICD-10-CM | POA: Diagnosis not present

## 2016-10-09 DIAGNOSIS — R296 Repeated falls: Secondary | ICD-10-CM | POA: Diagnosis not present

## 2016-10-09 DIAGNOSIS — K219 Gastro-esophageal reflux disease without esophagitis: Secondary | ICD-10-CM | POA: Diagnosis not present

## 2016-10-09 DIAGNOSIS — I4891 Unspecified atrial fibrillation: Secondary | ICD-10-CM | POA: Diagnosis not present

## 2016-10-09 DIAGNOSIS — I5032 Chronic diastolic (congestive) heart failure: Secondary | ICD-10-CM | POA: Diagnosis not present

## 2016-10-09 DIAGNOSIS — S0232XD Fracture of orbital floor, left side, subsequent encounter for fracture with routine healing: Secondary | ICD-10-CM | POA: Diagnosis not present

## 2016-10-10 ENCOUNTER — Telehealth (HOSPITAL_COMMUNITY): Payer: Self-pay | Admitting: *Deleted

## 2016-10-10 DIAGNOSIS — I110 Hypertensive heart disease with heart failure: Secondary | ICD-10-CM | POA: Diagnosis not present

## 2016-10-10 DIAGNOSIS — S0232XD Fracture of orbital floor, left side, subsequent encounter for fracture with routine healing: Secondary | ICD-10-CM | POA: Diagnosis not present

## 2016-10-10 DIAGNOSIS — R296 Repeated falls: Secondary | ICD-10-CM | POA: Diagnosis not present

## 2016-10-10 DIAGNOSIS — S62615D Displaced fracture of proximal phalanx of left ring finger, subsequent encounter for fracture with routine healing: Secondary | ICD-10-CM | POA: Diagnosis not present

## 2016-10-10 DIAGNOSIS — I480 Paroxysmal atrial fibrillation: Secondary | ICD-10-CM | POA: Diagnosis not present

## 2016-10-10 DIAGNOSIS — I5032 Chronic diastolic (congestive) heart failure: Secondary | ICD-10-CM | POA: Diagnosis not present

## 2016-10-10 IMAGING — CT CT ABD-PELV W/ CM
2 of 5 series · 15 of 46 positions shown, 17 images · IV contrast (APPLIED)
Comparison: None

CLINICAL DATA: Abdominal pain all over since last [REDACTED], nausea,
history asthma, paroxysmal atrial fibrillation, GERD, CHF, benign
essential hypertension, prior stroke

EXAM:
CT ABDOMEN AND PELVIS WITH CONTRAST
TECHNIQUE: Multidetector CT imaging of the abdomen and pelvis was performed
using the standard protocol following bolus administration of
intravenous contrast. Sagittal and coronal MPR images reconstructed
from axial data set.
CONTRAST:  80mL OMNIPAQUE IOHEXOL 300 MG/ML SOLN IV. No oral
contrast administered.

[Series 2: abd/ pelvis 5.0 i30f 1 · axial · 0.70mm/px · z∈[+824,+1194]mm · 12 of 84 slices shown, 14 images]
[im 5/84  soft-tissue]
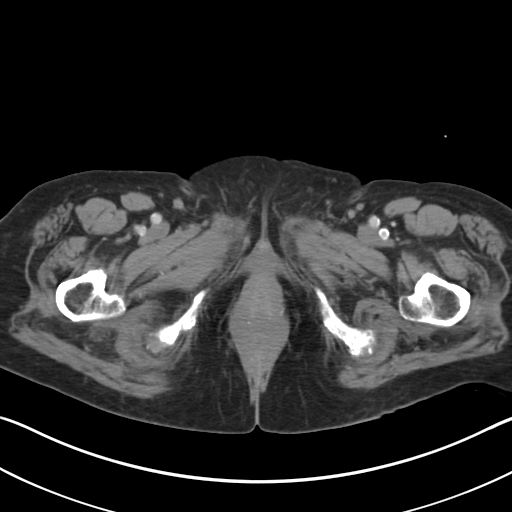
[im 5/84  bone]
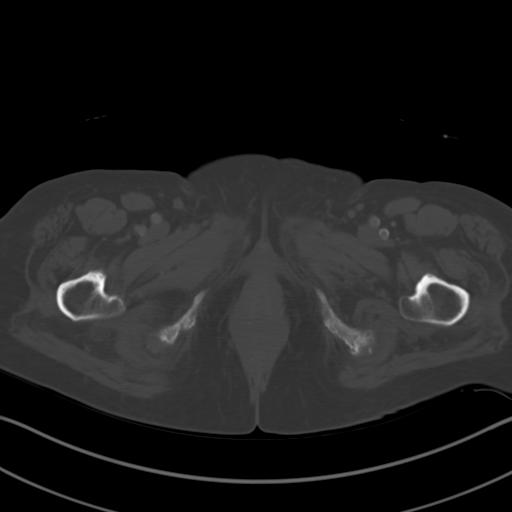
[im 14/84  soft-tissue]
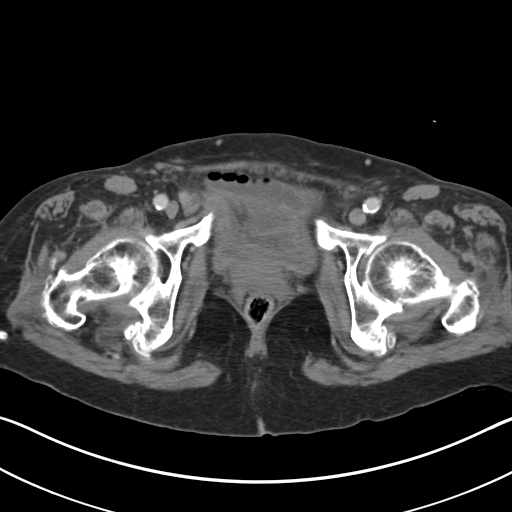
[im 19/84  soft-tissue]
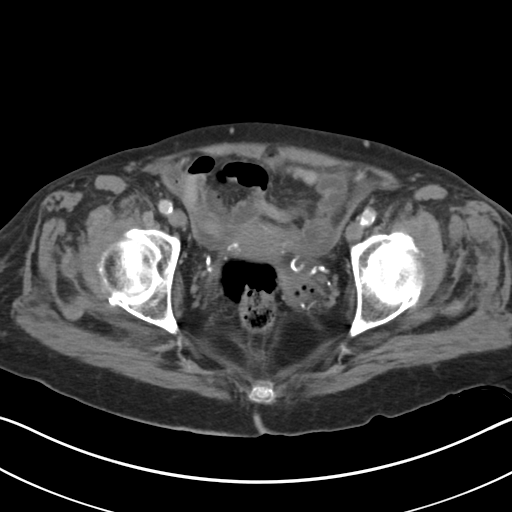
[im 24/84  soft-tissue]
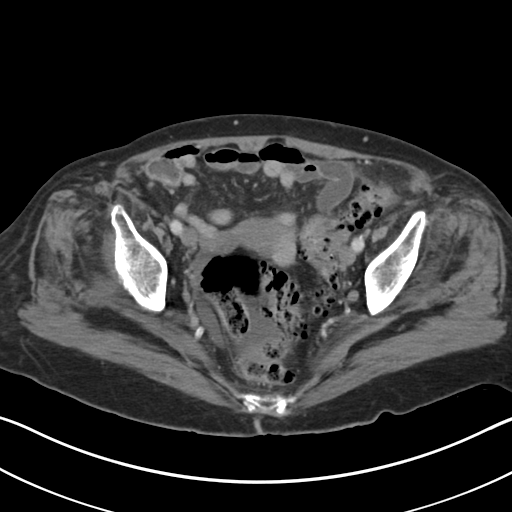
[im 33/84  soft-tissue]
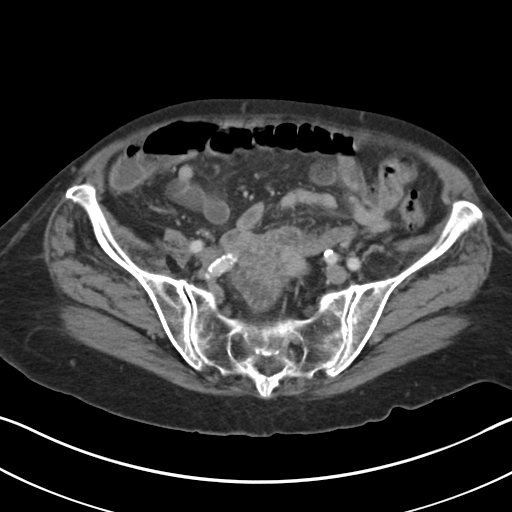
[im 37/84  soft-tissue]
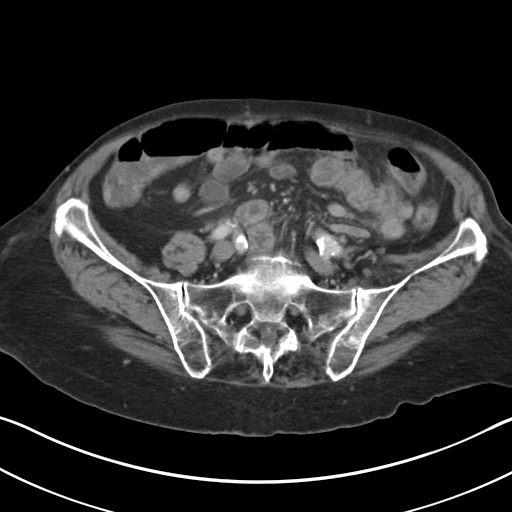
[im 47/84  soft-tissue]
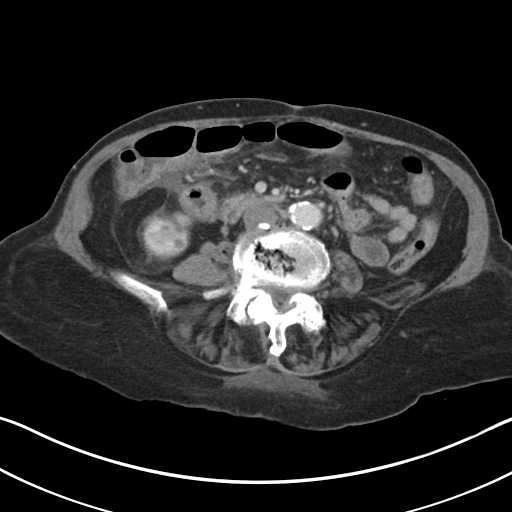
[im 51/84  soft-tissue]
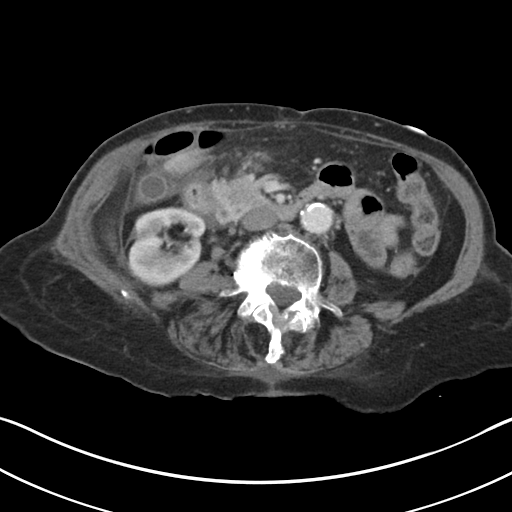
[im 60/84  soft-tissue]
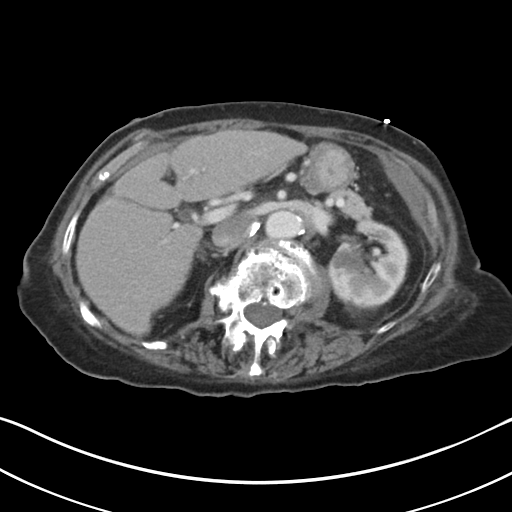
[im 60/84  bone]
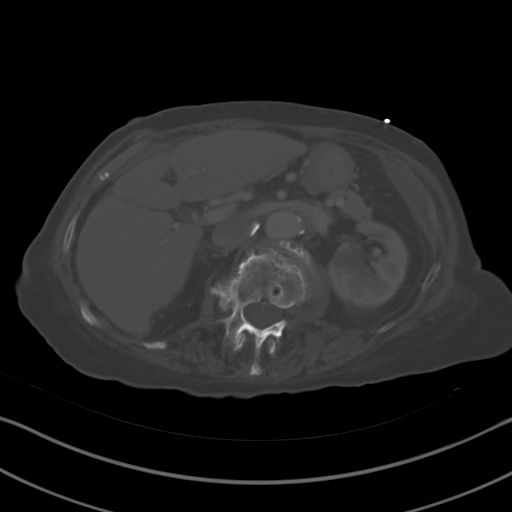
[im 65/84  soft-tissue]
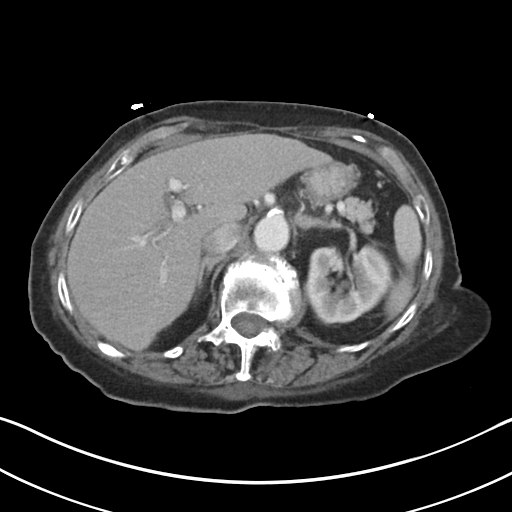
[im 70/84  soft-tissue]
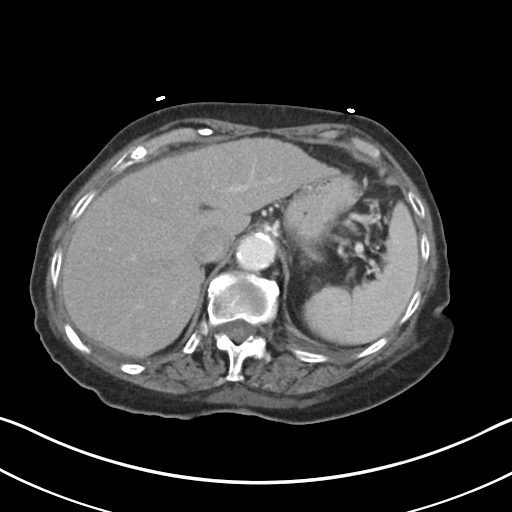
[im 79/84  soft-tissue]
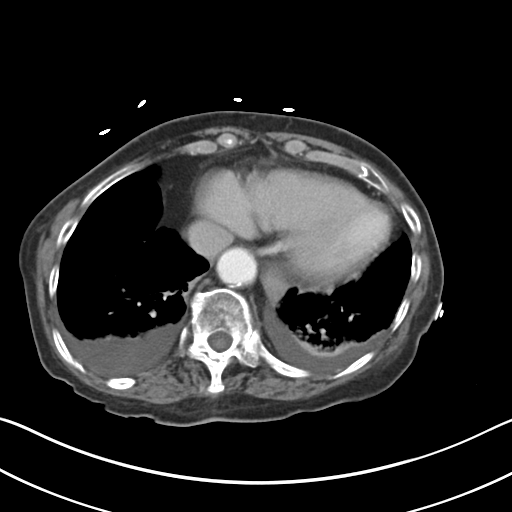

[Series 5: coronal soft tissue · coronal · 0.73mm/px · 3 of 76 slices shown]
[im 26/76  soft-tissue]
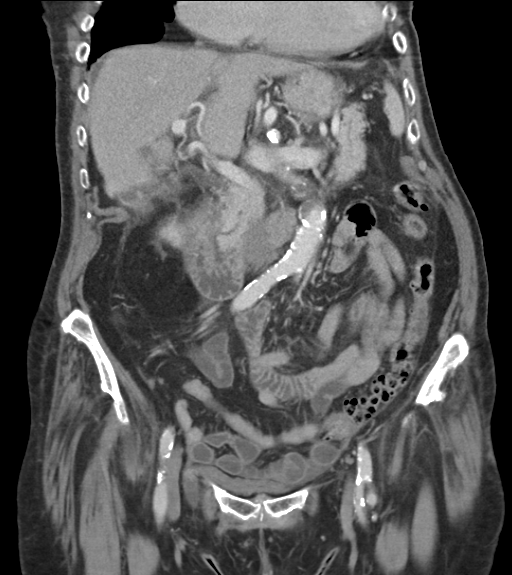
[im 34/76  soft-tissue]
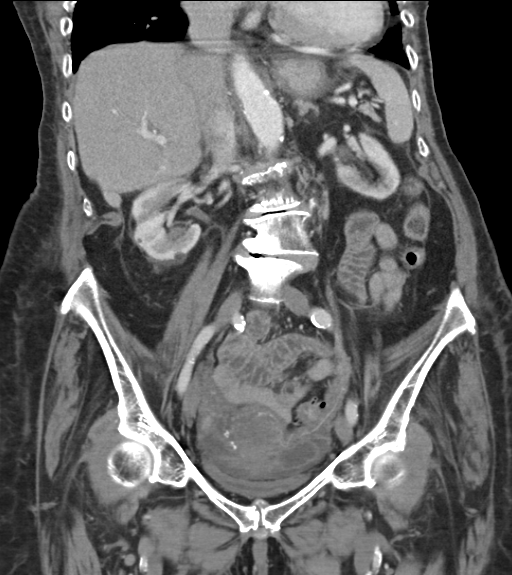
[im 42/76  soft-tissue]
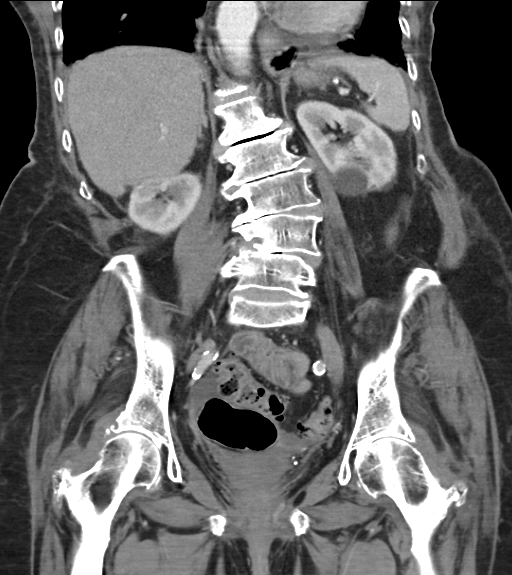

[15 of 46 positions shown; findings below may reference images not displayed]

FINDINGS: Bibasilar small pleural effusions and atelectasis.

LEFT renal cyst 3.5 x 2.5 cm image 28.

Question subtle nodularity of hepatic margins cannot exclude
cirrhosis.

Liver, spleen, kidneys, and adrenal glands otherwise normal.

Pancreatic body and tail normal appearance.

Pancreatic head shows a region of lower attenuation, approximately 2
cm diameter, uncertain etiology; this could represent subtle edema,
subtle mass, less likely few tiny cysts.

Infiltrative changes are seen adjacent to the duodenal bulb and
proximal descending duodenum as well as surrounding the adjacent
gallbladder.

These findings could reflect a regional inflammatory process, peptic
ulcer disease or cholecystitis.

No extraluminal gas collection seen to suggest perforated abscess.

Low-attenuation free intraperitoneal fluid is seen perihepatic and
in pelvis.

Diverticulosis of descending and sigmoid colon without acute
diverticulitis changes.

Stomach and remaining bowel loops otherwise normal.

Normal appendix.

Decompressed bladder, ureters, uterus and adnexa unremarkable.

Scattered atherosclerotic calcifications without discrete aneurysm.

Tiny ventral hernia containing fat in epigastrium.

No mass, adenopathy, free intraperitoneal air or acute bone lesion.

Extensive degenerative disc and facet disease changes lumbar spine.

Question Tarlov cysts within sacral spinal canal.
IMPRESSION: Free intraperitoneal fluid without free air.

Infiltrative changes adjacent to the first and proximal second
portions the duodenum as well as the adjacent gallbladder; these are
nonspecific, could be related to noted ascites, peptic ulcer
disease, cholecystitis or other regional inflammatory process ;
consider RIGHT upper quadrant abdominal ultrasound to assess.

Distal colonic diverticulosis without evidence of diverticulitis.

Bibasilar small pleural effusions and atelectasis.

Cannot exclude cirrhotic liver.

Questionable area of abnormal attenuation within the pancreatic
head, cannot exclude mass lesions; recommend correlation with serum
amylase and followup non emergent MR imaging of the abdomen with and
without contrast following resolution of the patient's current acute
process to assess, when patient is able to more fully cooperate with
optimal imaging.

## 2016-10-10 NOTE — Telephone Encounter (Signed)
Patient given detailed instructions per Stress Test Requisition Sheet for test on 10/16/16 at 7:30.Patient Notified to arrive 30 minutes early, and that it is imperative to arrive on time for appointment to keep from having the test rescheduled.  Patient verbalized understanding. Caroline Myers

## 2016-10-11 ENCOUNTER — Telehealth: Payer: Self-pay | Admitting: Cardiovascular Disease

## 2016-10-11 NOTE — Telephone Encounter (Signed)
I spoke with pt's daughter. She has several questions regarding TAVR procedure. Questions about success rate of TAVR, benefits of surgery, prognosis if pt did not have surgery.  Pt /daughter have cancelled dobutamine echo and would like to discuss with Dr. Angelena Form before proceeding.  I reviewed with Dr. Angelena Form and he would like to see pt back in office to discuss.  I spoke with pt's daughter and she is agreeable with pt coming in for office visit.  Pt has previously been scheduled to see Dr. Angelena Form for follow up on 4/27.  I offered earlier appt but daughter would like to keep scheduled visit on 4/27

## 2016-10-11 NOTE — Telephone Encounter (Signed)
New Message  Pts daughter voiced would like to speak with nurse/doc for additional questions.  Please f/u

## 2016-10-12 DIAGNOSIS — I679 Cerebrovascular disease, unspecified: Secondary | ICD-10-CM | POA: Diagnosis not present

## 2016-10-12 DIAGNOSIS — I509 Heart failure, unspecified: Secondary | ICD-10-CM | POA: Diagnosis not present

## 2016-10-12 DIAGNOSIS — I350 Nonrheumatic aortic (valve) stenosis: Secondary | ICD-10-CM | POA: Diagnosis not present

## 2016-10-12 DIAGNOSIS — J309 Allergic rhinitis, unspecified: Secondary | ICD-10-CM | POA: Diagnosis not present

## 2016-10-12 DIAGNOSIS — J449 Chronic obstructive pulmonary disease, unspecified: Secondary | ICD-10-CM | POA: Diagnosis not present

## 2016-10-12 DIAGNOSIS — I10 Essential (primary) hypertension: Secondary | ICD-10-CM | POA: Diagnosis not present

## 2016-10-12 DIAGNOSIS — I4891 Unspecified atrial fibrillation: Secondary | ICD-10-CM | POA: Diagnosis not present

## 2016-10-12 DIAGNOSIS — I519 Heart disease, unspecified: Secondary | ICD-10-CM | POA: Diagnosis not present

## 2016-10-12 DIAGNOSIS — F411 Generalized anxiety disorder: Secondary | ICD-10-CM | POA: Diagnosis not present

## 2016-10-12 DIAGNOSIS — R54 Age-related physical debility: Secondary | ICD-10-CM | POA: Diagnosis not present

## 2016-10-13 DIAGNOSIS — I4891 Unspecified atrial fibrillation: Secondary | ICD-10-CM | POA: Diagnosis not present

## 2016-10-13 DIAGNOSIS — K219 Gastro-esophageal reflux disease without esophagitis: Secondary | ICD-10-CM | POA: Diagnosis not present

## 2016-10-13 DIAGNOSIS — R05 Cough: Secondary | ICD-10-CM | POA: Diagnosis not present

## 2016-10-13 DIAGNOSIS — J449 Chronic obstructive pulmonary disease, unspecified: Secondary | ICD-10-CM | POA: Diagnosis not present

## 2016-10-13 DIAGNOSIS — Z79899 Other long term (current) drug therapy: Secondary | ICD-10-CM | POA: Diagnosis not present

## 2016-10-13 DIAGNOSIS — I509 Heart failure, unspecified: Secondary | ICD-10-CM | POA: Diagnosis not present

## 2016-10-13 DIAGNOSIS — B370 Candidal stomatitis: Secondary | ICD-10-CM | POA: Diagnosis not present

## 2016-10-13 DIAGNOSIS — F419 Anxiety disorder, unspecified: Secondary | ICD-10-CM | POA: Diagnosis not present

## 2016-10-13 DIAGNOSIS — R0602 Shortness of breath: Secondary | ICD-10-CM | POA: Diagnosis not present

## 2016-10-14 DIAGNOSIS — I509 Heart failure, unspecified: Secondary | ICD-10-CM | POA: Diagnosis not present

## 2016-10-14 DIAGNOSIS — I679 Cerebrovascular disease, unspecified: Secondary | ICD-10-CM | POA: Diagnosis not present

## 2016-10-14 DIAGNOSIS — J449 Chronic obstructive pulmonary disease, unspecified: Secondary | ICD-10-CM | POA: Diagnosis not present

## 2016-10-14 DIAGNOSIS — I519 Heart disease, unspecified: Secondary | ICD-10-CM | POA: Diagnosis not present

## 2016-10-14 DIAGNOSIS — I4891 Unspecified atrial fibrillation: Secondary | ICD-10-CM | POA: Diagnosis not present

## 2016-10-14 DIAGNOSIS — I350 Nonrheumatic aortic (valve) stenosis: Secondary | ICD-10-CM | POA: Diagnosis not present

## 2016-10-15 DIAGNOSIS — I350 Nonrheumatic aortic (valve) stenosis: Secondary | ICD-10-CM | POA: Diagnosis not present

## 2016-10-15 DIAGNOSIS — I4891 Unspecified atrial fibrillation: Secondary | ICD-10-CM | POA: Diagnosis not present

## 2016-10-15 DIAGNOSIS — J449 Chronic obstructive pulmonary disease, unspecified: Secondary | ICD-10-CM | POA: Diagnosis not present

## 2016-10-15 DIAGNOSIS — R0602 Shortness of breath: Secondary | ICD-10-CM | POA: Diagnosis not present

## 2016-10-15 DIAGNOSIS — I519 Heart disease, unspecified: Secondary | ICD-10-CM | POA: Diagnosis not present

## 2016-10-15 DIAGNOSIS — I509 Heart failure, unspecified: Secondary | ICD-10-CM | POA: Diagnosis not present

## 2016-10-15 DIAGNOSIS — I679 Cerebrovascular disease, unspecified: Secondary | ICD-10-CM | POA: Diagnosis not present

## 2016-10-16 ENCOUNTER — Other Ambulatory Visit (HOSPITAL_COMMUNITY): Payer: Self-pay

## 2016-10-16 DIAGNOSIS — J449 Chronic obstructive pulmonary disease, unspecified: Secondary | ICD-10-CM | POA: Diagnosis not present

## 2016-10-16 DIAGNOSIS — I4891 Unspecified atrial fibrillation: Secondary | ICD-10-CM | POA: Diagnosis not present

## 2016-10-16 DIAGNOSIS — K219 Gastro-esophageal reflux disease without esophagitis: Secondary | ICD-10-CM | POA: Diagnosis not present

## 2016-10-16 DIAGNOSIS — I679 Cerebrovascular disease, unspecified: Secondary | ICD-10-CM | POA: Diagnosis not present

## 2016-10-16 DIAGNOSIS — I519 Heart disease, unspecified: Secondary | ICD-10-CM | POA: Diagnosis not present

## 2016-10-16 DIAGNOSIS — F419 Anxiety disorder, unspecified: Secondary | ICD-10-CM | POA: Diagnosis not present

## 2016-10-16 DIAGNOSIS — I350 Nonrheumatic aortic (valve) stenosis: Secondary | ICD-10-CM | POA: Diagnosis not present

## 2016-10-16 DIAGNOSIS — I509 Heart failure, unspecified: Secondary | ICD-10-CM | POA: Diagnosis not present

## 2016-10-17 DIAGNOSIS — B351 Tinea unguium: Secondary | ICD-10-CM | POA: Diagnosis not present

## 2016-10-17 DIAGNOSIS — I679 Cerebrovascular disease, unspecified: Secondary | ICD-10-CM | POA: Diagnosis not present

## 2016-10-17 DIAGNOSIS — M2040 Other hammer toe(s) (acquired), unspecified foot: Secondary | ICD-10-CM | POA: Diagnosis not present

## 2016-10-17 DIAGNOSIS — L84 Corns and callosities: Secondary | ICD-10-CM | POA: Diagnosis not present

## 2016-10-17 DIAGNOSIS — I519 Heart disease, unspecified: Secondary | ICD-10-CM | POA: Diagnosis not present

## 2016-10-17 DIAGNOSIS — I4891 Unspecified atrial fibrillation: Secondary | ICD-10-CM | POA: Diagnosis not present

## 2016-10-17 DIAGNOSIS — I509 Heart failure, unspecified: Secondary | ICD-10-CM | POA: Diagnosis not present

## 2016-10-17 DIAGNOSIS — I350 Nonrheumatic aortic (valve) stenosis: Secondary | ICD-10-CM | POA: Diagnosis not present

## 2016-10-17 DIAGNOSIS — I739 Peripheral vascular disease, unspecified: Secondary | ICD-10-CM | POA: Diagnosis not present

## 2016-10-17 DIAGNOSIS — J449 Chronic obstructive pulmonary disease, unspecified: Secondary | ICD-10-CM | POA: Diagnosis not present

## 2016-10-18 DIAGNOSIS — I519 Heart disease, unspecified: Secondary | ICD-10-CM | POA: Diagnosis not present

## 2016-10-18 DIAGNOSIS — J449 Chronic obstructive pulmonary disease, unspecified: Secondary | ICD-10-CM | POA: Diagnosis not present

## 2016-10-18 DIAGNOSIS — I4891 Unspecified atrial fibrillation: Secondary | ICD-10-CM | POA: Diagnosis not present

## 2016-10-18 DIAGNOSIS — I679 Cerebrovascular disease, unspecified: Secondary | ICD-10-CM | POA: Diagnosis not present

## 2016-10-18 DIAGNOSIS — I350 Nonrheumatic aortic (valve) stenosis: Secondary | ICD-10-CM | POA: Diagnosis not present

## 2016-10-18 DIAGNOSIS — I509 Heart failure, unspecified: Secondary | ICD-10-CM | POA: Diagnosis not present

## 2016-10-19 DIAGNOSIS — I519 Heart disease, unspecified: Secondary | ICD-10-CM | POA: Diagnosis not present

## 2016-10-19 DIAGNOSIS — I4891 Unspecified atrial fibrillation: Secondary | ICD-10-CM | POA: Diagnosis not present

## 2016-10-19 DIAGNOSIS — J449 Chronic obstructive pulmonary disease, unspecified: Secondary | ICD-10-CM | POA: Diagnosis not present

## 2016-10-19 DIAGNOSIS — I679 Cerebrovascular disease, unspecified: Secondary | ICD-10-CM | POA: Diagnosis not present

## 2016-10-19 DIAGNOSIS — I509 Heart failure, unspecified: Secondary | ICD-10-CM | POA: Diagnosis not present

## 2016-10-19 DIAGNOSIS — I350 Nonrheumatic aortic (valve) stenosis: Secondary | ICD-10-CM | POA: Diagnosis not present

## 2016-10-21 DIAGNOSIS — I509 Heart failure, unspecified: Secondary | ICD-10-CM | POA: Diagnosis not present

## 2016-10-21 DIAGNOSIS — I519 Heart disease, unspecified: Secondary | ICD-10-CM | POA: Diagnosis not present

## 2016-10-21 DIAGNOSIS — I679 Cerebrovascular disease, unspecified: Secondary | ICD-10-CM | POA: Diagnosis not present

## 2016-10-21 DIAGNOSIS — I4891 Unspecified atrial fibrillation: Secondary | ICD-10-CM | POA: Diagnosis not present

## 2016-10-21 DIAGNOSIS — J449 Chronic obstructive pulmonary disease, unspecified: Secondary | ICD-10-CM | POA: Diagnosis not present

## 2016-10-21 DIAGNOSIS — I350 Nonrheumatic aortic (valve) stenosis: Secondary | ICD-10-CM | POA: Diagnosis not present

## 2016-10-23 DIAGNOSIS — I4891 Unspecified atrial fibrillation: Secondary | ICD-10-CM | POA: Diagnosis not present

## 2016-10-23 DIAGNOSIS — I519 Heart disease, unspecified: Secondary | ICD-10-CM | POA: Diagnosis not present

## 2016-10-23 DIAGNOSIS — I679 Cerebrovascular disease, unspecified: Secondary | ICD-10-CM | POA: Diagnosis not present

## 2016-10-23 DIAGNOSIS — I350 Nonrheumatic aortic (valve) stenosis: Secondary | ICD-10-CM | POA: Diagnosis not present

## 2016-10-23 DIAGNOSIS — I509 Heart failure, unspecified: Secondary | ICD-10-CM | POA: Diagnosis not present

## 2016-10-23 DIAGNOSIS — J449 Chronic obstructive pulmonary disease, unspecified: Secondary | ICD-10-CM | POA: Diagnosis not present

## 2016-10-24 DIAGNOSIS — I4891 Unspecified atrial fibrillation: Secondary | ICD-10-CM | POA: Diagnosis not present

## 2016-10-24 DIAGNOSIS — I350 Nonrheumatic aortic (valve) stenosis: Secondary | ICD-10-CM | POA: Diagnosis not present

## 2016-10-24 DIAGNOSIS — I519 Heart disease, unspecified: Secondary | ICD-10-CM | POA: Diagnosis not present

## 2016-10-24 DIAGNOSIS — J449 Chronic obstructive pulmonary disease, unspecified: Secondary | ICD-10-CM | POA: Diagnosis not present

## 2016-10-24 DIAGNOSIS — I509 Heart failure, unspecified: Secondary | ICD-10-CM | POA: Diagnosis not present

## 2016-10-24 DIAGNOSIS — I679 Cerebrovascular disease, unspecified: Secondary | ICD-10-CM | POA: Diagnosis not present

## 2016-10-25 ENCOUNTER — Ambulatory Visit: Payer: Self-pay | Admitting: Cardiovascular Disease

## 2016-10-25 DIAGNOSIS — I519 Heart disease, unspecified: Secondary | ICD-10-CM | POA: Diagnosis not present

## 2016-10-25 DIAGNOSIS — J449 Chronic obstructive pulmonary disease, unspecified: Secondary | ICD-10-CM | POA: Diagnosis not present

## 2016-10-25 DIAGNOSIS — I509 Heart failure, unspecified: Secondary | ICD-10-CM | POA: Diagnosis not present

## 2016-10-25 DIAGNOSIS — I350 Nonrheumatic aortic (valve) stenosis: Secondary | ICD-10-CM | POA: Diagnosis not present

## 2016-10-25 DIAGNOSIS — I4891 Unspecified atrial fibrillation: Secondary | ICD-10-CM | POA: Diagnosis not present

## 2016-10-25 DIAGNOSIS — I679 Cerebrovascular disease, unspecified: Secondary | ICD-10-CM | POA: Diagnosis not present

## 2016-10-26 DIAGNOSIS — I519 Heart disease, unspecified: Secondary | ICD-10-CM | POA: Diagnosis not present

## 2016-10-26 DIAGNOSIS — J449 Chronic obstructive pulmonary disease, unspecified: Secondary | ICD-10-CM | POA: Diagnosis not present

## 2016-10-26 DIAGNOSIS — I509 Heart failure, unspecified: Secondary | ICD-10-CM | POA: Diagnosis not present

## 2016-10-26 DIAGNOSIS — I4891 Unspecified atrial fibrillation: Secondary | ICD-10-CM | POA: Diagnosis not present

## 2016-10-26 DIAGNOSIS — I679 Cerebrovascular disease, unspecified: Secondary | ICD-10-CM | POA: Diagnosis not present

## 2016-10-26 DIAGNOSIS — I350 Nonrheumatic aortic (valve) stenosis: Secondary | ICD-10-CM | POA: Diagnosis not present

## 2016-10-27 DIAGNOSIS — I509 Heart failure, unspecified: Secondary | ICD-10-CM | POA: Diagnosis not present

## 2016-10-27 DIAGNOSIS — I519 Heart disease, unspecified: Secondary | ICD-10-CM | POA: Diagnosis not present

## 2016-10-27 DIAGNOSIS — I679 Cerebrovascular disease, unspecified: Secondary | ICD-10-CM | POA: Diagnosis not present

## 2016-10-27 DIAGNOSIS — I350 Nonrheumatic aortic (valve) stenosis: Secondary | ICD-10-CM | POA: Diagnosis not present

## 2016-10-27 DIAGNOSIS — I4891 Unspecified atrial fibrillation: Secondary | ICD-10-CM | POA: Diagnosis not present

## 2016-10-27 DIAGNOSIS — J449 Chronic obstructive pulmonary disease, unspecified: Secondary | ICD-10-CM | POA: Diagnosis not present

## 2016-10-28 DIAGNOSIS — I350 Nonrheumatic aortic (valve) stenosis: Secondary | ICD-10-CM | POA: Diagnosis not present

## 2016-10-28 DIAGNOSIS — I509 Heart failure, unspecified: Secondary | ICD-10-CM | POA: Diagnosis not present

## 2016-10-28 DIAGNOSIS — I4891 Unspecified atrial fibrillation: Secondary | ICD-10-CM | POA: Diagnosis not present

## 2016-10-28 DIAGNOSIS — I519 Heart disease, unspecified: Secondary | ICD-10-CM | POA: Diagnosis not present

## 2016-10-28 DIAGNOSIS — I679 Cerebrovascular disease, unspecified: Secondary | ICD-10-CM | POA: Diagnosis not present

## 2016-10-28 DIAGNOSIS — J449 Chronic obstructive pulmonary disease, unspecified: Secondary | ICD-10-CM | POA: Diagnosis not present

## 2016-10-29 DIAGNOSIS — J309 Allergic rhinitis, unspecified: Secondary | ICD-10-CM | POA: Diagnosis not present

## 2016-10-29 DIAGNOSIS — I350 Nonrheumatic aortic (valve) stenosis: Secondary | ICD-10-CM | POA: Diagnosis not present

## 2016-10-29 DIAGNOSIS — I519 Heart disease, unspecified: Secondary | ICD-10-CM | POA: Diagnosis not present

## 2016-10-29 DIAGNOSIS — R54 Age-related physical debility: Secondary | ICD-10-CM | POA: Diagnosis not present

## 2016-10-29 DIAGNOSIS — I4891 Unspecified atrial fibrillation: Secondary | ICD-10-CM | POA: Diagnosis not present

## 2016-10-29 DIAGNOSIS — J449 Chronic obstructive pulmonary disease, unspecified: Secondary | ICD-10-CM | POA: Diagnosis not present

## 2016-10-29 DIAGNOSIS — F411 Generalized anxiety disorder: Secondary | ICD-10-CM | POA: Diagnosis not present

## 2016-10-29 DIAGNOSIS — I10 Essential (primary) hypertension: Secondary | ICD-10-CM | POA: Diagnosis not present

## 2016-10-29 DIAGNOSIS — I509 Heart failure, unspecified: Secondary | ICD-10-CM | POA: Diagnosis not present

## 2016-10-29 DIAGNOSIS — I679 Cerebrovascular disease, unspecified: Secondary | ICD-10-CM | POA: Diagnosis not present

## 2016-10-30 DIAGNOSIS — I4891 Unspecified atrial fibrillation: Secondary | ICD-10-CM | POA: Diagnosis not present

## 2016-10-30 DIAGNOSIS — I350 Nonrheumatic aortic (valve) stenosis: Secondary | ICD-10-CM | POA: Diagnosis not present

## 2016-10-30 DIAGNOSIS — K219 Gastro-esophageal reflux disease without esophagitis: Secondary | ICD-10-CM | POA: Diagnosis not present

## 2016-10-30 DIAGNOSIS — F419 Anxiety disorder, unspecified: Secondary | ICD-10-CM | POA: Diagnosis not present

## 2016-10-30 DIAGNOSIS — J309 Allergic rhinitis, unspecified: Secondary | ICD-10-CM | POA: Diagnosis not present

## 2016-10-30 DIAGNOSIS — I679 Cerebrovascular disease, unspecified: Secondary | ICD-10-CM | POA: Diagnosis not present

## 2016-10-30 DIAGNOSIS — R040 Epistaxis: Secondary | ICD-10-CM | POA: Diagnosis not present

## 2016-10-30 DIAGNOSIS — I519 Heart disease, unspecified: Secondary | ICD-10-CM | POA: Diagnosis not present

## 2016-10-30 DIAGNOSIS — I509 Heart failure, unspecified: Secondary | ICD-10-CM | POA: Diagnosis not present

## 2016-10-30 DIAGNOSIS — J449 Chronic obstructive pulmonary disease, unspecified: Secondary | ICD-10-CM | POA: Diagnosis not present

## 2016-10-31 ENCOUNTER — Ambulatory Visit: Payer: Medicare Other | Admitting: Podiatry

## 2016-11-01 DIAGNOSIS — I679 Cerebrovascular disease, unspecified: Secondary | ICD-10-CM | POA: Diagnosis not present

## 2016-11-01 DIAGNOSIS — J449 Chronic obstructive pulmonary disease, unspecified: Secondary | ICD-10-CM | POA: Diagnosis not present

## 2016-11-01 DIAGNOSIS — I509 Heart failure, unspecified: Secondary | ICD-10-CM | POA: Diagnosis not present

## 2016-11-01 DIAGNOSIS — I519 Heart disease, unspecified: Secondary | ICD-10-CM | POA: Diagnosis not present

## 2016-11-01 DIAGNOSIS — I350 Nonrheumatic aortic (valve) stenosis: Secondary | ICD-10-CM | POA: Diagnosis not present

## 2016-11-01 DIAGNOSIS — I4891 Unspecified atrial fibrillation: Secondary | ICD-10-CM | POA: Diagnosis not present

## 2016-11-04 ENCOUNTER — Telehealth (HOSPITAL_COMMUNITY): Payer: Self-pay | Admitting: Cardiovascular Disease

## 2016-11-04 NOTE — Telephone Encounter (Signed)
Per cancel reason on 4/27: Pt is currently in hospice per daughter.

## 2016-11-05 DIAGNOSIS — I509 Heart failure, unspecified: Secondary | ICD-10-CM | POA: Diagnosis not present

## 2016-11-05 DIAGNOSIS — I350 Nonrheumatic aortic (valve) stenosis: Secondary | ICD-10-CM | POA: Diagnosis not present

## 2016-11-05 DIAGNOSIS — I679 Cerebrovascular disease, unspecified: Secondary | ICD-10-CM | POA: Diagnosis not present

## 2016-11-05 DIAGNOSIS — J449 Chronic obstructive pulmonary disease, unspecified: Secondary | ICD-10-CM | POA: Diagnosis not present

## 2016-11-05 DIAGNOSIS — I4891 Unspecified atrial fibrillation: Secondary | ICD-10-CM | POA: Diagnosis not present

## 2016-11-05 DIAGNOSIS — I519 Heart disease, unspecified: Secondary | ICD-10-CM | POA: Diagnosis not present

## 2016-11-06 DIAGNOSIS — I679 Cerebrovascular disease, unspecified: Secondary | ICD-10-CM | POA: Diagnosis not present

## 2016-11-06 DIAGNOSIS — I350 Nonrheumatic aortic (valve) stenosis: Secondary | ICD-10-CM | POA: Diagnosis not present

## 2016-11-06 DIAGNOSIS — F419 Anxiety disorder, unspecified: Secondary | ICD-10-CM | POA: Diagnosis not present

## 2016-11-06 DIAGNOSIS — I519 Heart disease, unspecified: Secondary | ICD-10-CM | POA: Diagnosis not present

## 2016-11-06 DIAGNOSIS — H1032 Unspecified acute conjunctivitis, left eye: Secondary | ICD-10-CM | POA: Diagnosis not present

## 2016-11-06 DIAGNOSIS — K219 Gastro-esophageal reflux disease without esophagitis: Secondary | ICD-10-CM | POA: Diagnosis not present

## 2016-11-06 DIAGNOSIS — J309 Allergic rhinitis, unspecified: Secondary | ICD-10-CM | POA: Diagnosis not present

## 2016-11-06 DIAGNOSIS — R040 Epistaxis: Secondary | ICD-10-CM | POA: Diagnosis not present

## 2016-11-06 DIAGNOSIS — J449 Chronic obstructive pulmonary disease, unspecified: Secondary | ICD-10-CM | POA: Diagnosis not present

## 2016-11-06 DIAGNOSIS — I509 Heart failure, unspecified: Secondary | ICD-10-CM | POA: Diagnosis not present

## 2016-11-06 DIAGNOSIS — I4891 Unspecified atrial fibrillation: Secondary | ICD-10-CM | POA: Diagnosis not present

## 2016-11-07 DIAGNOSIS — I519 Heart disease, unspecified: Secondary | ICD-10-CM | POA: Diagnosis not present

## 2016-11-07 DIAGNOSIS — J449 Chronic obstructive pulmonary disease, unspecified: Secondary | ICD-10-CM | POA: Diagnosis not present

## 2016-11-07 DIAGNOSIS — I509 Heart failure, unspecified: Secondary | ICD-10-CM | POA: Diagnosis not present

## 2016-11-07 DIAGNOSIS — I679 Cerebrovascular disease, unspecified: Secondary | ICD-10-CM | POA: Diagnosis not present

## 2016-11-07 DIAGNOSIS — I4891 Unspecified atrial fibrillation: Secondary | ICD-10-CM | POA: Diagnosis not present

## 2016-11-07 DIAGNOSIS — I350 Nonrheumatic aortic (valve) stenosis: Secondary | ICD-10-CM | POA: Diagnosis not present

## 2016-11-08 DIAGNOSIS — I509 Heart failure, unspecified: Secondary | ICD-10-CM | POA: Diagnosis not present

## 2016-11-08 DIAGNOSIS — I350 Nonrheumatic aortic (valve) stenosis: Secondary | ICD-10-CM | POA: Diagnosis not present

## 2016-11-08 DIAGNOSIS — I679 Cerebrovascular disease, unspecified: Secondary | ICD-10-CM | POA: Diagnosis not present

## 2016-11-08 DIAGNOSIS — I519 Heart disease, unspecified: Secondary | ICD-10-CM | POA: Diagnosis not present

## 2016-11-08 DIAGNOSIS — I4891 Unspecified atrial fibrillation: Secondary | ICD-10-CM | POA: Diagnosis not present

## 2016-11-08 DIAGNOSIS — J449 Chronic obstructive pulmonary disease, unspecified: Secondary | ICD-10-CM | POA: Diagnosis not present

## 2016-11-11 DIAGNOSIS — I509 Heart failure, unspecified: Secondary | ICD-10-CM | POA: Diagnosis not present

## 2016-11-11 DIAGNOSIS — I519 Heart disease, unspecified: Secondary | ICD-10-CM | POA: Diagnosis not present

## 2016-11-11 DIAGNOSIS — I350 Nonrheumatic aortic (valve) stenosis: Secondary | ICD-10-CM | POA: Diagnosis not present

## 2016-11-11 DIAGNOSIS — I679 Cerebrovascular disease, unspecified: Secondary | ICD-10-CM | POA: Diagnosis not present

## 2016-11-11 DIAGNOSIS — J449 Chronic obstructive pulmonary disease, unspecified: Secondary | ICD-10-CM | POA: Diagnosis not present

## 2016-11-11 DIAGNOSIS — I4891 Unspecified atrial fibrillation: Secondary | ICD-10-CM | POA: Diagnosis not present

## 2016-11-12 DIAGNOSIS — I679 Cerebrovascular disease, unspecified: Secondary | ICD-10-CM | POA: Diagnosis not present

## 2016-11-12 DIAGNOSIS — I350 Nonrheumatic aortic (valve) stenosis: Secondary | ICD-10-CM | POA: Diagnosis not present

## 2016-11-12 DIAGNOSIS — I509 Heart failure, unspecified: Secondary | ICD-10-CM | POA: Diagnosis not present

## 2016-11-12 DIAGNOSIS — I4891 Unspecified atrial fibrillation: Secondary | ICD-10-CM | POA: Diagnosis not present

## 2016-11-12 DIAGNOSIS — I519 Heart disease, unspecified: Secondary | ICD-10-CM | POA: Diagnosis not present

## 2016-11-12 DIAGNOSIS — J449 Chronic obstructive pulmonary disease, unspecified: Secondary | ICD-10-CM | POA: Diagnosis not present

## 2016-11-13 DIAGNOSIS — H1032 Unspecified acute conjunctivitis, left eye: Secondary | ICD-10-CM | POA: Diagnosis not present

## 2016-11-13 DIAGNOSIS — R040 Epistaxis: Secondary | ICD-10-CM | POA: Diagnosis not present

## 2016-11-13 DIAGNOSIS — I350 Nonrheumatic aortic (valve) stenosis: Secondary | ICD-10-CM | POA: Diagnosis not present

## 2016-11-13 DIAGNOSIS — J449 Chronic obstructive pulmonary disease, unspecified: Secondary | ICD-10-CM | POA: Diagnosis not present

## 2016-11-13 DIAGNOSIS — I4891 Unspecified atrial fibrillation: Secondary | ICD-10-CM | POA: Diagnosis not present

## 2016-11-13 DIAGNOSIS — K219 Gastro-esophageal reflux disease without esophagitis: Secondary | ICD-10-CM | POA: Diagnosis not present

## 2016-11-13 DIAGNOSIS — J309 Allergic rhinitis, unspecified: Secondary | ICD-10-CM | POA: Diagnosis not present

## 2016-11-13 DIAGNOSIS — F419 Anxiety disorder, unspecified: Secondary | ICD-10-CM | POA: Diagnosis not present

## 2016-11-13 DIAGNOSIS — I679 Cerebrovascular disease, unspecified: Secondary | ICD-10-CM | POA: Diagnosis not present

## 2016-11-13 DIAGNOSIS — I509 Heart failure, unspecified: Secondary | ICD-10-CM | POA: Diagnosis not present

## 2016-11-13 DIAGNOSIS — I519 Heart disease, unspecified: Secondary | ICD-10-CM | POA: Diagnosis not present

## 2016-11-14 DIAGNOSIS — I4891 Unspecified atrial fibrillation: Secondary | ICD-10-CM | POA: Diagnosis not present

## 2016-11-14 DIAGNOSIS — I519 Heart disease, unspecified: Secondary | ICD-10-CM | POA: Diagnosis not present

## 2016-11-14 DIAGNOSIS — I509 Heart failure, unspecified: Secondary | ICD-10-CM | POA: Diagnosis not present

## 2016-11-14 DIAGNOSIS — J449 Chronic obstructive pulmonary disease, unspecified: Secondary | ICD-10-CM | POA: Diagnosis not present

## 2016-11-14 DIAGNOSIS — I679 Cerebrovascular disease, unspecified: Secondary | ICD-10-CM | POA: Diagnosis not present

## 2016-11-14 DIAGNOSIS — I350 Nonrheumatic aortic (valve) stenosis: Secondary | ICD-10-CM | POA: Diagnosis not present

## 2016-11-15 DIAGNOSIS — J449 Chronic obstructive pulmonary disease, unspecified: Secondary | ICD-10-CM | POA: Diagnosis not present

## 2016-11-15 DIAGNOSIS — I4891 Unspecified atrial fibrillation: Secondary | ICD-10-CM | POA: Diagnosis not present

## 2016-11-15 DIAGNOSIS — I519 Heart disease, unspecified: Secondary | ICD-10-CM | POA: Diagnosis not present

## 2016-11-15 DIAGNOSIS — I679 Cerebrovascular disease, unspecified: Secondary | ICD-10-CM | POA: Diagnosis not present

## 2016-11-15 DIAGNOSIS — I350 Nonrheumatic aortic (valve) stenosis: Secondary | ICD-10-CM | POA: Diagnosis not present

## 2016-11-15 DIAGNOSIS — I509 Heart failure, unspecified: Secondary | ICD-10-CM | POA: Diagnosis not present

## 2016-11-18 ENCOUNTER — Ambulatory Visit: Payer: Medicare Other | Admitting: Nurse Practitioner

## 2016-11-18 DIAGNOSIS — I4891 Unspecified atrial fibrillation: Secondary | ICD-10-CM | POA: Diagnosis not present

## 2016-11-18 DIAGNOSIS — Z79899 Other long term (current) drug therapy: Secondary | ICD-10-CM | POA: Diagnosis not present

## 2016-11-18 DIAGNOSIS — I509 Heart failure, unspecified: Secondary | ICD-10-CM | POA: Diagnosis not present

## 2016-11-18 DIAGNOSIS — F419 Anxiety disorder, unspecified: Secondary | ICD-10-CM | POA: Diagnosis not present

## 2016-11-18 DIAGNOSIS — J449 Chronic obstructive pulmonary disease, unspecified: Secondary | ICD-10-CM | POA: Diagnosis not present

## 2016-11-18 DIAGNOSIS — I679 Cerebrovascular disease, unspecified: Secondary | ICD-10-CM | POA: Diagnosis not present

## 2016-11-18 DIAGNOSIS — I350 Nonrheumatic aortic (valve) stenosis: Secondary | ICD-10-CM | POA: Diagnosis not present

## 2016-11-18 DIAGNOSIS — K219 Gastro-esophageal reflux disease without esophagitis: Secondary | ICD-10-CM | POA: Diagnosis not present

## 2016-11-18 DIAGNOSIS — I519 Heart disease, unspecified: Secondary | ICD-10-CM | POA: Diagnosis not present

## 2016-11-19 DIAGNOSIS — I509 Heart failure, unspecified: Secondary | ICD-10-CM | POA: Diagnosis not present

## 2016-11-19 DIAGNOSIS — I350 Nonrheumatic aortic (valve) stenosis: Secondary | ICD-10-CM | POA: Diagnosis not present

## 2016-11-19 DIAGNOSIS — J449 Chronic obstructive pulmonary disease, unspecified: Secondary | ICD-10-CM | POA: Diagnosis not present

## 2016-11-19 DIAGNOSIS — I679 Cerebrovascular disease, unspecified: Secondary | ICD-10-CM | POA: Diagnosis not present

## 2016-11-19 DIAGNOSIS — I4891 Unspecified atrial fibrillation: Secondary | ICD-10-CM | POA: Diagnosis not present

## 2016-11-19 DIAGNOSIS — I519 Heart disease, unspecified: Secondary | ICD-10-CM | POA: Diagnosis not present

## 2016-11-20 DIAGNOSIS — Z66 Do not resuscitate: Secondary | ICD-10-CM | POA: Diagnosis not present

## 2016-11-20 DIAGNOSIS — I110 Hypertensive heart disease with heart failure: Secondary | ICD-10-CM | POA: Diagnosis not present

## 2016-11-20 DIAGNOSIS — I519 Heart disease, unspecified: Secondary | ICD-10-CM | POA: Diagnosis not present

## 2016-11-20 DIAGNOSIS — I4891 Unspecified atrial fibrillation: Secondary | ICD-10-CM | POA: Diagnosis not present

## 2016-11-20 DIAGNOSIS — I509 Heart failure, unspecified: Secondary | ICD-10-CM | POA: Diagnosis not present

## 2016-11-20 DIAGNOSIS — J449 Chronic obstructive pulmonary disease, unspecified: Secondary | ICD-10-CM | POA: Diagnosis not present

## 2016-11-20 DIAGNOSIS — I350 Nonrheumatic aortic (valve) stenosis: Secondary | ICD-10-CM | POA: Diagnosis not present

## 2016-11-20 DIAGNOSIS — I679 Cerebrovascular disease, unspecified: Secondary | ICD-10-CM | POA: Diagnosis not present

## 2016-11-22 DIAGNOSIS — J449 Chronic obstructive pulmonary disease, unspecified: Secondary | ICD-10-CM | POA: Diagnosis not present

## 2016-11-22 DIAGNOSIS — I519 Heart disease, unspecified: Secondary | ICD-10-CM | POA: Diagnosis not present

## 2016-11-22 DIAGNOSIS — I350 Nonrheumatic aortic (valve) stenosis: Secondary | ICD-10-CM | POA: Diagnosis not present

## 2016-11-22 DIAGNOSIS — I509 Heart failure, unspecified: Secondary | ICD-10-CM | POA: Diagnosis not present

## 2016-11-22 DIAGNOSIS — I4891 Unspecified atrial fibrillation: Secondary | ICD-10-CM | POA: Diagnosis not present

## 2016-11-22 DIAGNOSIS — I679 Cerebrovascular disease, unspecified: Secondary | ICD-10-CM | POA: Diagnosis not present

## 2016-11-24 DIAGNOSIS — J449 Chronic obstructive pulmonary disease, unspecified: Secondary | ICD-10-CM | POA: Diagnosis not present

## 2016-11-24 DIAGNOSIS — I350 Nonrheumatic aortic (valve) stenosis: Secondary | ICD-10-CM | POA: Diagnosis not present

## 2016-11-24 DIAGNOSIS — I679 Cerebrovascular disease, unspecified: Secondary | ICD-10-CM | POA: Diagnosis not present

## 2016-11-24 DIAGNOSIS — I4891 Unspecified atrial fibrillation: Secondary | ICD-10-CM | POA: Diagnosis not present

## 2016-11-24 DIAGNOSIS — I519 Heart disease, unspecified: Secondary | ICD-10-CM | POA: Diagnosis not present

## 2016-11-24 DIAGNOSIS — I509 Heart failure, unspecified: Secondary | ICD-10-CM | POA: Diagnosis not present

## 2016-11-25 DIAGNOSIS — I350 Nonrheumatic aortic (valve) stenosis: Secondary | ICD-10-CM | POA: Diagnosis not present

## 2016-11-25 DIAGNOSIS — J449 Chronic obstructive pulmonary disease, unspecified: Secondary | ICD-10-CM | POA: Diagnosis not present

## 2016-11-25 DIAGNOSIS — I509 Heart failure, unspecified: Secondary | ICD-10-CM | POA: Diagnosis not present

## 2016-11-25 DIAGNOSIS — I519 Heart disease, unspecified: Secondary | ICD-10-CM | POA: Diagnosis not present

## 2016-11-25 DIAGNOSIS — I4891 Unspecified atrial fibrillation: Secondary | ICD-10-CM | POA: Diagnosis not present

## 2016-11-25 DIAGNOSIS — I679 Cerebrovascular disease, unspecified: Secondary | ICD-10-CM | POA: Diagnosis not present

## 2016-11-26 DIAGNOSIS — I679 Cerebrovascular disease, unspecified: Secondary | ICD-10-CM | POA: Diagnosis not present

## 2016-11-26 DIAGNOSIS — I509 Heart failure, unspecified: Secondary | ICD-10-CM | POA: Diagnosis not present

## 2016-11-26 DIAGNOSIS — J449 Chronic obstructive pulmonary disease, unspecified: Secondary | ICD-10-CM | POA: Diagnosis not present

## 2016-11-26 DIAGNOSIS — I519 Heart disease, unspecified: Secondary | ICD-10-CM | POA: Diagnosis not present

## 2016-11-26 DIAGNOSIS — I350 Nonrheumatic aortic (valve) stenosis: Secondary | ICD-10-CM | POA: Diagnosis not present

## 2016-11-26 DIAGNOSIS — I4891 Unspecified atrial fibrillation: Secondary | ICD-10-CM | POA: Diagnosis not present

## 2016-11-27 DIAGNOSIS — K219 Gastro-esophageal reflux disease without esophagitis: Secondary | ICD-10-CM | POA: Diagnosis not present

## 2016-11-27 DIAGNOSIS — I4891 Unspecified atrial fibrillation: Secondary | ICD-10-CM | POA: Diagnosis not present

## 2016-11-27 DIAGNOSIS — I509 Heart failure, unspecified: Secondary | ICD-10-CM | POA: Diagnosis not present

## 2016-11-27 DIAGNOSIS — F419 Anxiety disorder, unspecified: Secondary | ICD-10-CM | POA: Diagnosis not present

## 2016-11-27 DIAGNOSIS — R635 Abnormal weight gain: Secondary | ICD-10-CM | POA: Diagnosis not present

## 2016-11-27 DIAGNOSIS — H1032 Unspecified acute conjunctivitis, left eye: Secondary | ICD-10-CM | POA: Diagnosis not present

## 2016-11-27 DIAGNOSIS — I679 Cerebrovascular disease, unspecified: Secondary | ICD-10-CM | POA: Diagnosis not present

## 2016-11-27 DIAGNOSIS — I350 Nonrheumatic aortic (valve) stenosis: Secondary | ICD-10-CM | POA: Diagnosis not present

## 2016-11-27 DIAGNOSIS — J449 Chronic obstructive pulmonary disease, unspecified: Secondary | ICD-10-CM | POA: Diagnosis not present

## 2016-11-27 DIAGNOSIS — J309 Allergic rhinitis, unspecified: Secondary | ICD-10-CM | POA: Diagnosis not present

## 2016-11-27 DIAGNOSIS — I519 Heart disease, unspecified: Secondary | ICD-10-CM | POA: Diagnosis not present

## 2016-11-28 DIAGNOSIS — I509 Heart failure, unspecified: Secondary | ICD-10-CM | POA: Diagnosis not present

## 2016-11-28 DIAGNOSIS — I679 Cerebrovascular disease, unspecified: Secondary | ICD-10-CM | POA: Diagnosis not present

## 2016-11-28 DIAGNOSIS — I519 Heart disease, unspecified: Secondary | ICD-10-CM | POA: Diagnosis not present

## 2016-11-28 DIAGNOSIS — J449 Chronic obstructive pulmonary disease, unspecified: Secondary | ICD-10-CM | POA: Diagnosis not present

## 2016-11-28 DIAGNOSIS — I350 Nonrheumatic aortic (valve) stenosis: Secondary | ICD-10-CM | POA: Diagnosis not present

## 2016-11-28 DIAGNOSIS — I4891 Unspecified atrial fibrillation: Secondary | ICD-10-CM | POA: Diagnosis not present

## 2016-11-29 DIAGNOSIS — J309 Allergic rhinitis, unspecified: Secondary | ICD-10-CM | POA: Diagnosis not present

## 2016-11-29 DIAGNOSIS — R54 Age-related physical debility: Secondary | ICD-10-CM | POA: Diagnosis not present

## 2016-11-29 DIAGNOSIS — I10 Essential (primary) hypertension: Secondary | ICD-10-CM | POA: Diagnosis not present

## 2016-11-29 DIAGNOSIS — I350 Nonrheumatic aortic (valve) stenosis: Secondary | ICD-10-CM | POA: Diagnosis not present

## 2016-11-29 DIAGNOSIS — I519 Heart disease, unspecified: Secondary | ICD-10-CM | POA: Diagnosis not present

## 2016-11-29 DIAGNOSIS — F411 Generalized anxiety disorder: Secondary | ICD-10-CM | POA: Diagnosis not present

## 2016-11-29 DIAGNOSIS — J449 Chronic obstructive pulmonary disease, unspecified: Secondary | ICD-10-CM | POA: Diagnosis not present

## 2016-11-29 DIAGNOSIS — I509 Heart failure, unspecified: Secondary | ICD-10-CM | POA: Diagnosis not present

## 2016-11-29 DIAGNOSIS — I679 Cerebrovascular disease, unspecified: Secondary | ICD-10-CM | POA: Diagnosis not present

## 2016-11-29 DIAGNOSIS — I4891 Unspecified atrial fibrillation: Secondary | ICD-10-CM | POA: Diagnosis not present

## 2016-12-05 DIAGNOSIS — H02104 Unspecified ectropion of left upper eyelid: Secondary | ICD-10-CM | POA: Diagnosis not present

## 2016-12-05 DIAGNOSIS — H5201 Hypermetropia, right eye: Secondary | ICD-10-CM | POA: Diagnosis not present

## 2016-12-09 ENCOUNTER — Encounter (HOSPITAL_COMMUNITY): Payer: Self-pay

## 2016-12-09 ENCOUNTER — Emergency Department (HOSPITAL_COMMUNITY)

## 2016-12-09 ENCOUNTER — Emergency Department (HOSPITAL_COMMUNITY)
Admission: EM | Admit: 2016-12-09 | Discharge: 2016-12-09 | Disposition: A | Discharge status: Home / Self Care | Attending: Emergency Medicine | Admitting: Emergency Medicine

## 2016-12-09 DIAGNOSIS — W19XXXA Unspecified fall, initial encounter: Secondary | ICD-10-CM | POA: Diagnosis not present

## 2016-12-09 DIAGNOSIS — Z79899 Other long term (current) drug therapy: Secondary | ICD-10-CM | POA: Insufficient documentation

## 2016-12-09 DIAGNOSIS — S022XXA Fracture of nasal bones, initial encounter for closed fracture: Secondary | ICD-10-CM | POA: Diagnosis not present

## 2016-12-09 DIAGNOSIS — I5042 Chronic combined systolic (congestive) and diastolic (congestive) heart failure: Secondary | ICD-10-CM | POA: Insufficient documentation

## 2016-12-09 DIAGNOSIS — I110 Hypertensive heart disease with heart failure: Secondary | ICD-10-CM | POA: Diagnosis not present

## 2016-12-09 DIAGNOSIS — Y9389 Activity, other specified: Secondary | ICD-10-CM | POA: Diagnosis not present

## 2016-12-09 DIAGNOSIS — S42031A Displaced fracture of lateral end of right clavicle, initial encounter for closed fracture: Secondary | ICD-10-CM | POA: Insufficient documentation

## 2016-12-09 DIAGNOSIS — I629 Nontraumatic intracranial hemorrhage, unspecified: Secondary | ICD-10-CM | POA: Diagnosis not present

## 2016-12-09 DIAGNOSIS — S43101A Unspecified dislocation of right acromioclavicular joint, initial encounter: Secondary | ICD-10-CM | POA: Diagnosis not present

## 2016-12-09 DIAGNOSIS — Y92009 Unspecified place in unspecified non-institutional (private) residence as the place of occurrence of the external cause: Secondary | ICD-10-CM | POA: Diagnosis not present

## 2016-12-09 DIAGNOSIS — S065X9A Traumatic subdural hemorrhage with loss of consciousness of unspecified duration, initial encounter: Secondary | ICD-10-CM | POA: Insufficient documentation

## 2016-12-09 DIAGNOSIS — M25511 Pain in right shoulder: Secondary | ICD-10-CM | POA: Diagnosis not present

## 2016-12-09 DIAGNOSIS — Z8673 Personal history of transient ischemic attack (TIA), and cerebral infarction without residual deficits: Secondary | ICD-10-CM | POA: Insufficient documentation

## 2016-12-09 DIAGNOSIS — Z7901 Long term (current) use of anticoagulants: Secondary | ICD-10-CM | POA: Diagnosis not present

## 2016-12-09 DIAGNOSIS — Y999 Unspecified external cause status: Secondary | ICD-10-CM | POA: Insufficient documentation

## 2016-12-09 DIAGNOSIS — T148XXA Other injury of unspecified body region, initial encounter: Secondary | ICD-10-CM | POA: Diagnosis not present

## 2016-12-09 DIAGNOSIS — Z515 Encounter for palliative care: Secondary | ICD-10-CM

## 2016-12-09 DIAGNOSIS — S4991XA Unspecified injury of right shoulder and upper arm, initial encounter: Secondary | ICD-10-CM | POA: Diagnosis present

## 2016-12-09 DIAGNOSIS — T07XXXA Unspecified multiple injuries, initial encounter: Secondary | ICD-10-CM

## 2016-12-09 DIAGNOSIS — S299XXA Unspecified injury of thorax, initial encounter: Secondary | ICD-10-CM | POA: Diagnosis not present

## 2016-12-09 DIAGNOSIS — S199XXA Unspecified injury of neck, initial encounter: Secondary | ICD-10-CM | POA: Diagnosis not present

## 2016-12-09 DIAGNOSIS — S065XAA Traumatic subdural hemorrhage with loss of consciousness status unknown, initial encounter: Secondary | ICD-10-CM

## 2016-12-09 DIAGNOSIS — Z85828 Personal history of other malignant neoplasm of skin: Secondary | ICD-10-CM | POA: Insufficient documentation

## 2016-12-09 DIAGNOSIS — J449 Chronic obstructive pulmonary disease, unspecified: Secondary | ICD-10-CM | POA: Insufficient documentation

## 2016-12-09 DIAGNOSIS — S065X0A Traumatic subdural hemorrhage without loss of consciousness, initial encounter: Secondary | ICD-10-CM | POA: Diagnosis not present

## 2016-12-09 DIAGNOSIS — W0110XA Fall on same level from slipping, tripping and stumbling with subsequent striking against unspecified object, initial encounter: Secondary | ICD-10-CM | POA: Diagnosis not present

## 2016-12-09 DIAGNOSIS — S6992XA Unspecified injury of left wrist, hand and finger(s), initial encounter: Secondary | ICD-10-CM | POA: Diagnosis not present

## 2016-12-09 LAB — I-STAT CHEM 8, ED
BUN: 20 mg/dL (ref 6–20)
CALCIUM ION: 1.05 mmol/L — AB (ref 1.15–1.40)
CHLORIDE: 102 mmol/L (ref 101–111)
Creatinine, Ser: 0.7 mg/dL (ref 0.44–1.00)
Glucose, Bld: 95 mg/dL (ref 65–99)
HEMATOCRIT: 42 % (ref 36.0–46.0)
Hemoglobin: 14.3 g/dL (ref 12.0–15.0)
POTASSIUM: 4 mmol/L (ref 3.5–5.1)
SODIUM: 139 mmol/L (ref 135–145)
TCO2: 29 mmol/L (ref 0–100)

## 2016-12-09 LAB — CBC WITH DIFFERENTIAL/PLATELET
Basophils Absolute: 0 10*3/uL (ref 0.0–0.1)
Basophils Relative: 0 %
EOS ABS: 0.3 10*3/uL (ref 0.0–0.7)
Eosinophils Relative: 3 %
HCT: 43.7 % (ref 36.0–46.0)
HEMOGLOBIN: 13.9 g/dL (ref 12.0–15.0)
LYMPHS ABS: 0.7 10*3/uL (ref 0.7–4.0)
Lymphocytes Relative: 7 %
MCH: 31.4 pg (ref 26.0–34.0)
MCHC: 31.8 g/dL (ref 30.0–36.0)
MCV: 98.6 fL (ref 78.0–100.0)
Monocytes Absolute: 1 10*3/uL (ref 0.1–1.0)
Monocytes Relative: 10 %
NEUTROS PCT: 80 %
Neutro Abs: 7.8 10*3/uL — ABNORMAL HIGH (ref 1.7–7.7)
Platelets: 212 10*3/uL (ref 150–400)
RBC: 4.43 MIL/uL (ref 3.87–5.11)
RDW: 14.1 % (ref 11.5–15.5)
WBC: 9.7 10*3/uL (ref 4.0–10.5)

## 2016-12-09 MED ORDER — LEVETIRACETAM 500 MG PO TABS
500.0000 mg | ORAL_TABLET | Freq: Two times a day (BID) | ORAL | Status: DC
  Administered 2016-12-09: 500 mg via ORAL
  Filled 2016-12-09: qty 1

## 2016-12-09 MED ORDER — LEVETIRACETAM 500 MG PO TABS: 500.0000 mg | ORAL_TABLET | Freq: Two times a day (BID) | ORAL | 0 refills | Status: DC

## 2016-12-09 NOTE — ED Notes (Signed)
Pt placed in paper scrubs and is ready for dc upon gems arrival.

## 2016-12-09 NOTE — ED Provider Notes (Signed)
[facility] DEPT Provider Note   CSN: [id number] Arrival date & time: 12/09/16  0740     History   Chief Complaint Chief Complaint  Patient presents with  . Fall    HPI Caroline Myers is a 83 y.o. female.  The history is provided by the patient, medical records and a relative. No language interpreter was used.  Fall  This is a recurrent problem. The current episode started 1 to 2 hours ago. The problem occurs every several days. The problem has not changed since onset.Pertinent negatives include no chest pain, no abdominal pain and no shortness of breath. Nothing aggravates the symptoms. The symptoms are relieved by lying down and rest. She has tried nothing for the symptoms.    Past Medical History:  Diagnosis Date  . Allergic rhinitis   . Anxiety   . Arthritis    "neck, spine, hands" (06/28/2016)  . Asthma 2000   mild. tried Advair after bronchospasm after exposure to cats  . Atrial flutter ([place])   . Chronic combined systolic and diastolic CHF (congestive heart failure) ([place])   . COPD (chronic obstructive pulmonary disease) ([place])   . GERD (gastroesophageal reflux disease)    occ  . Intracranial hemorrhage following injury ([place]) 01/03/2015  . Normal coronary arteries    a. underwent treadmill stress test in April 2008 which shows normal perfusion study, however she had chest pain and EKG changes during the terminal portion concerning for false negative study. She had normal coronaries on cardiac catheterization in May 2008.   Marland Kitchen Permanent atrial fibrillation (HCC)    pt does not want anticoagulation  . Pneumonia 07/2015  . Scoliosis   . Severe aortic stenosis   . Shoulder pain, left 05/2010   with rotator cuff tear--Dr Alfonso Ramus  . Skin cancer of nose    "MOHS; think it was basal"  . Squamous carcinoma    "burned off my face"  . Stroke [facility]) 1/ 2014; 12/2014   denies residual on 06/28/2016    Patient Active Problem List   Diagnosis Date Noted  . Chronic  combined systolic and diastolic CHF (congestive heart failure) ([place]) 09/19/2016  . Chest pain 08/29/2016  . Concussion 07/06/2016  . CHF exacerbation ([place]) 06/28/2016  . Respiratory failure with hypoxia ([place]) 06/28/2016  . URI (upper respiratory infection) 06/28/2016  . Chest pressure 06/28/2016  . Oral dyskinesia 06/28/2016  . Falls 05/20/2016  . Pulmonary hypertension ([place]) 03/15/2016  . Anxiety about health   . Cough   . Hypokalemia   . AKI (acute kidney injury) ([place])   . Debility 07/20/2015  . Sepsis ([place]) 07/20/2015  . History of CVA (cerebrovascular accident)   . Tachypnea   . Leukocytosis 07/17/2015  . Arterial hypotension   . Hypotension 07/16/2015  . Elevated troponin 07/16/2015  . PNA (pneumonia)   . Severe sepsis ([place]) 07/15/2015  . Acute respiratory failure with hypercapnia ([place]) 07/15/2015  . Chronic anticoagulation 04/18/2015  . Orthostatic hypotension 03/09/2015  . CVA (cerebral infarction) 02/14/2015  . Stroke ([place]) 02/14/2015  . Stroke with cerebral ischemia ([place])   . Cerebral infarction due to embolism of left middle cerebral artery ([place])   . Occipital fracture ([place])   . Benign essential HTN   . Other allergic rhinitis   . Depression   . SAH (subarachnoid hemorrhage) ([place]) 01/02/2015  . SDH (subdural hematoma) ([place]) 01/02/2015  . Aortic atherosclerosis ([place]) 08/05/2014  . Nonspecific abnormal unspecified cardiovascular function study 10/28/2013  . Severe aortic stenosis 10/28/2013  .  HLD (hyperlipidemia) 07/28/2013  . TIA (transient ischemic attack) 07/27/2013  . History of CHF (congestive heart failure) 07/27/2013  . Urticaria 11/06/2010  . Bronchitis 10/12/2010  . Preventative health care   . Anxiety state 03/29/2010  . RIB PAIN, LEFT SIDED 10/12/2009  . ALLERGIC RHINITIS 06/28/2009  . SHOULDER PAIN, LEFT 06/28/2009  . SCIATICA, RIGHT 06/28/2009  . HEADACHE 06/28/2009  . DYSPNEA ON EXERTION 06/28/2009  . SKIN CANCER, HX OF 06/28/2009    Past  Surgical History:  Procedure Laterality Date  . CATARACT EXTRACTION W/ INTRAOCULAR LENS  IMPLANT, BILATERAL Bilateral   . DILATION AND CURETTAGE OF UTERUS    . HERNIA REPAIR  2003  . LUMBAR LAMINECTOMY/DECOMPRESSION MICRODISCECTOMY  04/01/2012   Procedure: LUMBAR LAMINECTOMY/DECOMPRESSION MICRODISCECTOMY 1 LEVEL;  Surgeon: Eustace Moore, MD;  Location: [facility];  Service: Neurosurgery;  Laterality: Right;  Right Lumbar four-five extraforaminal microdiskectomy  . MOHS SURGERY     "nose"  . SHOULDER ARTHROSCOPY Right 2000   ACROMIOPLASTY  . TONSILLECTOMY      OB History    No data available       Home Medications    Prior to Admission medications   Medication Sig Start Date End Date Taking? Authorizing Provider  acetaminophen (TYLENOL) 325 MG tablet Take 650 mg by mouth every 4 (four) hours as needed for mild pain or fever.   Yes [provider]  acetaminophen (TYLENOL) 650 MG suppository Place 650 mg rectally every 4 (four) hours as needed for mild pain or fever.   Yes [provider]  ALPRAZolam (XANAX) 0.25 MG tablet Take 0.25 mg by mouth 2 (two) times daily.    Yes [provider]  ALPRAZolam (XANAX) 0.25 MG tablet Take 0.25 mg by mouth daily as needed for anxiety.   Yes [provider]  apixaban (ELIQUIS) 5 MG TABS tablet Take 1 tablet (5 mg total) by mouth 2 (two) times daily. 07/28/15  Yes Angiulli, Lavon Paganini, PA-C  ARTIFICIAL TEAR OP Place 1 drop into the right eye 4 (four) times daily.   Yes [provider]  azelastine (OPTIVAR) 0.05 % ophthalmic solution Place 1 drop into the left eye 2 (two) times daily.  11/15/16  Yes [provider]  furosemide (LASIX) 20 MG tablet Take 1 tablet (20 mg total) by mouth daily. Patient taking differently: Take 10 mg by mouth daily.  08/21/16  Yes Jettie Booze, MD  ipratropium-albuterol (DUONEB) 0.5-2.5 (3) MG/3ML SOLN Take 3 mLs by nebulization 3 (three) times daily.  11/14/16  Yes  [provider]  ipratropium-albuterol (DUONEB) 0.5-2.5 (3) MG/3ML SOLN Take 3 mLs by nebulization every 4 (four) hours as needed (Shortness of Breath).   Yes [provider]  oxymetazoline (AFRIN) 0.05 % nasal spray Place 2 sprays into both nostrils 2 (two) times daily as needed for congestion.   Yes [provider]  potassium chloride (K-DUR) 10 MEQ tablet Take 1 tablet (10 mEq total) by mouth daily. 08/21/16  Yes Jettie Booze, MD  sertraline (ZOLOFT) 50 MG tablet Take 1 tablet (50 mg total) by mouth daily. 07/28/15  Yes Angiulli, Lavon Paganini, PA-C  spironolactone (ALDACTONE) 25 MG tablet Take 12.5 mg by mouth daily. Taken on Monday Wednesday and Friday 10/09/16  Yes [provider]  acetaminophen (TYLENOL) 500 MG tablet Take 1 tablet (500 mg total) by mouth every 6 (six) hours as needed. Patient not taking: Reported on 12/09/2016 08/31/16   Arrien, Jimmy Picket, MD  [facility]  108 (90 Base) MCG/ACT inhaler Inhale 2 puffs into the lungs every 4 (four) hours as needed for wheezing or shortness of breath. 06/30/16   GhimireHenreitta Leber, MD    Family History Family History  Problem Relation Age of Onset  . Heart failure Father   . Stroke Sister   . Heart attack Neg Hx   . Hypertension Neg Hx     Social History Social History  Substance Use Topics  . Smoking status: Never Smoker  . Smokeless tobacco: Never Used  . Alcohol use 0.0 oz/week     Comment: 06/28/2016 "moved to assisted living 06/18/2016; nothing since then"     Allergies   Tape and Verapamil   Review of Systems Review of Systems  Constitutional: Negative for chills and fever.  HENT: Negative for ear pain and sore throat.   Eyes: Negative for pain and visual disturbance.  Respiratory: Negative for cough and shortness of breath.   Cardiovascular: Negative for chest pain and palpitations.  Gastrointestinal: Negative for abdominal pain and vomiting.  Genitourinary: Negative for  dysuria and hematuria.  Musculoskeletal: Negative for arthralgias and back pain.  Skin: Positive for wound. Negative for color change and rash.  Neurological: Negative for seizures and syncope.  All other systems reviewed and are negative.    Physical Exam Updated Vital Signs BP 129/77   Pulse 78   Temp 98 F (36.7 C) (Oral)   Resp (!) 30   SpO2 95%   Physical Exam  Constitutional: She is oriented to person, place, and time. She appears well-developed. No distress.  Thin elderly female  HENT:  Head: Normocephalic.  Moderate hematoma over R frontal region  Eyes: Conjunctivae and EOM are normal.  Neck: Neck supple.  Cardiovascular: Normal rate and regular rhythm.   No murmur heard. Pulmonary/Chest: Effort normal and breath sounds normal. No respiratory distress.  Abdominal: Soft. There is no tenderness.  Musculoskeletal: She exhibits edema and tenderness (R distal clavicle and shoulder TTP).  Skin tear over R shoulder  Neurological: She is alert and oriented to person, place, and time. No cranial nerve deficit. Coordination normal.  5/5 motor strength and intact sensation in all extremities. Finger-to-nose intact bilaterally  Skin: Skin is warm and dry.  Nursing note and vitals reviewed.    ED Treatments / Results  Labs (all labs ordered are listed, but only abnormal results are displayed) Labs Reviewed  CBC WITH DIFFERENTIAL/PLATELET - Abnormal; Notable for the following:       Result Value   Neutro Abs 7.8 (*)    All other components within normal limits  I-STAT CHEM 8, ED - Abnormal; Notable for the following:    Calcium, Ion 1.05 (*)    All other components within normal limits    EKG  EKG Interpretation None       Radiology Dg Chest 2 View  Result Date: 12/09/2016 CLINICAL DATA:  Fall. EXAM: CHEST  2 VIEW COMPARISON:  Radiographs of August 29, 2016. FINDINGS: Stable cardiomegaly. Atherosclerosis of thoracic aorta is noted. No pneumothorax is noted.  Narrowing of right subacromial space is noted suggesting rotator cuff injury. Mildly displaced distal right clavicular fracture is noted. Increased interstitial densities are noted in both lung bases concerning for mild pulmonary edema. Minimal bilateral pleural effusions are noted. Multilevel degenerative disc disease is noted in the thoracic spine. IMPRESSION: Aortic atherosclerosis. Mildly displaced distal right clavicular fracture. Findings concerning for mild pulmonary edema with minimal pleural effusions. Electronically Signed   By: Jeneen Rinks  Murlean Caller, M.D.   On: 12/09/2016 09:35   Dg Shoulder Right  Result Date: 12/09/2016 CLINICAL DATA:  Fall with right shoulder pain and abrasion. Initial encounter. EXAM: RIGHT SHOULDER - 2+ VIEW COMPARISON:  01/04/2016 FINDINGS: There is a lucency through the upper aspect of the distal clavicle which is not convincing for an acute fracture, but was not clearly seen on July 2017 radiograph. Progressive widening of AC joint, 6 mm today. Chronic high humeral head from rotator cuff tear. Glenohumeral osteoarthritis. Osteopenia. IMPRESSION: 1. Wide and mildly offset acromioclavicular joint suspicious for separation. 2. Indeterminate lucency in the distal clavicle, although not definitive for acute fracture the appearance is new from 2017. Clavicle radiography may be helpful. 3. Chronic rotator cuff tear and glenohumeral osteoarthritis. 4. Osteopenia. Electronically Signed   By: Monte Fantasia M.D.   On: 12/09/2016 09:38   Ct Head Wo Contrast  Result Date: 12/09/2016 CLINICAL DATA:  83 year old female post fall tripping over oxygen tubing. Bleeding right eye/ nose. Initial encounter. EXAM: CT HEAD WITHOUT CONTRAST CT MAXILLOFACIAL WITHOUT CONTRAST CT CERVICAL SPINE WITHOUT CONTRAST TECHNIQUE: Multidetector CT imaging of the head, cervical spine, and maxillofacial structures were performed using the standard protocol without intravenous contrast. Multiplanar CT image  reconstructions of the cervical spine and maxillofacial structures were also generated. COMPARISON:  07/06/2016. FINDINGS: CT HEAD FINDINGS Brain: Tiny anterior left parafalcine subdural hematoma. Chronic microvascular changes. Moderate global atrophy without hydrocephalus. No intracranial mass lesion noted on this unenhanced exam. Vascular: Vascular calcifications Skull: No skull fracture. Other: Moderately large hematoma right frontal/ supraorbital region. Right nasal bone fracture. Remote left orbital floor fracture. CT MAXILLOFACIAL FINDINGS Osseous: Right nasal bone fracture with minimal displacement. Orbits: Remote left orbital floor fracture. Sinuses: Minimal polypoid opacification left maxillary sinus. Soft tissues: Right frontal/supraorbital hematoma. Post lens replacement. Globes appear to be grossly intact. CT CERVICAL SPINE FINDINGS Alignment: Reversal of the normal cervical lordosis. Anterior slip of C2 similar prior exam felt to be related to degenerative changes. Skull base and vertebrae: Evaluation of the upper cervical spine is limited by motion degradation although visualized adequately on face CT. No acute cervical spine fracture. Chronic minimal regularity anterior margin C1 left lateral mass. Remote right clavicle fracture. Soft tissues and spinal canal: No abnormal prevertebral soft tissue swelling. Cervical spondylotic changes with various degrees of spinal stenosis and cord compression C2-3 thru C6-7. Cord flattening and compression most notable on the right at the C5-6 level. Upper chest: No acute abnormality. Other: No acute abnormality. IMPRESSION: CT HEAD Tiny anterior left parafalcine subdural hematoma. Chronic microvascular changes. Moderate global atrophy without hydrocephalus. CT MAXILLOFACIAL Right nasal bone fracture with minimal displacement. Remote left orbital floor fracture. Right frontal/supraorbital hematoma. Post lens replacement. Globes appear to be grossly intact. No right  orbital fracture. CT CERVICAL SPINE No acute cervical spine fracture. Cervical spondylotic changes C2-3 thru C6-7 with significant cord flattening and compression most notable on the right at the C5-6 level (noted on prior exam). If there were any clinical suspicion of cord injury, MR may then be considered. These results were called by telephone at the time of interpretation on 12/09/2016 at 9:55 am to Dr. Pilar Plate Takira Sherrin , who verbally acknowledged these results. Electronically Signed   By: Genia Del M.D.   On: 12/09/2016 10:01   Ct Cervical Spine Wo Contrast  Result Date: 12/09/2016 CLINICAL DATA:  83 year old female post fall tripping over oxygen tubing. Bleeding right eye/ nose. Initial encounter. EXAM: CT HEAD WITHOUT CONTRAST CT MAXILLOFACIAL WITHOUT CONTRAST  CT CERVICAL SPINE WITHOUT CONTRAST TECHNIQUE: Multidetector CT imaging of the head, cervical spine, and maxillofacial structures were performed using the standard protocol without intravenous contrast. Multiplanar CT image reconstructions of the cervical spine and maxillofacial structures were also generated. COMPARISON:  07/06/2016. FINDINGS: CT HEAD FINDINGS Brain: Tiny anterior left parafalcine subdural hematoma. Chronic microvascular changes. Moderate global atrophy without hydrocephalus. No intracranial mass lesion noted on this unenhanced exam. Vascular: Vascular calcifications Skull: No skull fracture. Other: Moderately large hematoma right frontal/ supraorbital region. Right nasal bone fracture. Remote left orbital floor fracture. CT MAXILLOFACIAL FINDINGS Osseous: Right nasal bone fracture with minimal displacement. Orbits: Remote left orbital floor fracture. Sinuses: Minimal polypoid opacification left maxillary sinus. Soft tissues: Right frontal/supraorbital hematoma. Post lens replacement. Globes appear to be grossly intact. CT CERVICAL SPINE FINDINGS Alignment: Reversal of the normal cervical lordosis. Anterior slip of C2 similar prior exam  felt to be related to degenerative changes. Skull base and vertebrae: Evaluation of the upper cervical spine is limited by motion degradation although visualized adequately on face CT. No acute cervical spine fracture. Chronic minimal regularity anterior margin C1 left lateral mass. Remote right clavicle fracture. Soft tissues and spinal canal: No abnormal prevertebral soft tissue swelling. Cervical spondylotic changes with various degrees of spinal stenosis and cord compression C2-3 thru C6-7. Cord flattening and compression most notable on the right at the C5-6 level. Upper chest: No acute abnormality. Other: No acute abnormality. IMPRESSION: CT HEAD Tiny anterior left parafalcine subdural hematoma. Chronic microvascular changes. Moderate global atrophy without hydrocephalus. CT MAXILLOFACIAL Right nasal bone fracture with minimal displacement. Remote left orbital floor fracture. Right frontal/supraorbital hematoma. Post lens replacement. Globes appear to be grossly intact. No right orbital fracture. CT CERVICAL SPINE No acute cervical spine fracture. Cervical spondylotic changes C2-3 thru C6-7 with significant cord flattening and compression most notable on the right at the C5-6 level (noted on prior exam). If there were any clinical suspicion of cord injury, MR may then be considered. These results were called by telephone at the time of interpretation on 12/09/2016 at 9:55 am to Dr. Pilar Plate Demerius Podolak , who verbally acknowledged these results. Electronically Signed   By: Genia Del M.D.   On: 12/09/2016 10:01   Dg Hand Complete Left  Result Date: 12/09/2016 CLINICAL DATA:  Status post fall EXAM: LEFT HAND - COMPLETE 3+ VIEW COMPARISON:  07/05/2016 FINDINGS: Generalized osteopenia. Healed left fifth metacarpal neck fracture. Healed fracture of the base of the fourth proximal phalanx. No acute fracture or dislocation. Severe osteoarthritis of the first [facility] joint. Mild osteoarthritis of the scaphotrapeziotrapezoid  joint. Mild osteoarthritis of the first MCP joint and first IP joint. Mild osteoarthritis of the PIP and DIP joints. Small periarticular erosion involving the base of the fifth proximal phalanx. IMPRESSION: 1.  No acute osseous injury of the left hand. 2. Osteoarthritis of the left hand as described above. Electronically Signed   By: Kathreen Devoid   On: 12/09/2016 11:59   Ct Maxillofacial Wo Contrast  Result Date: 12/09/2016 CLINICAL DATA:  83 year old female post fall tripping over oxygen tubing. Bleeding right eye/ nose. Initial encounter. EXAM: CT HEAD WITHOUT CONTRAST CT MAXILLOFACIAL WITHOUT CONTRAST CT CERVICAL SPINE WITHOUT CONTRAST TECHNIQUE: Multidetector CT imaging of the head, cervical spine, and maxillofacial structures were performed using the standard protocol without intravenous contrast. Multiplanar CT image reconstructions of the cervical spine and maxillofacial structures were also generated. COMPARISON:  07/06/2016. FINDINGS: CT HEAD FINDINGS Brain: Tiny anterior left parafalcine subdural hematoma. Chronic microvascular changes.  Moderate global atrophy without hydrocephalus. No intracranial mass lesion noted on this unenhanced exam. Vascular: Vascular calcifications Skull: No skull fracture. Other: Moderately large hematoma right frontal/ supraorbital region. Right nasal bone fracture. Remote left orbital floor fracture. CT MAXILLOFACIAL FINDINGS Osseous: Right nasal bone fracture with minimal displacement. Orbits: Remote left orbital floor fracture. Sinuses: Minimal polypoid opacification left maxillary sinus. Soft tissues: Right frontal/supraorbital hematoma. Post lens replacement. Globes appear to be grossly intact. CT CERVICAL SPINE FINDINGS Alignment: Reversal of the normal cervical lordosis. Anterior slip of C2 similar prior exam felt to be related to degenerative changes. Skull base and vertebrae: Evaluation of the upper cervical spine is limited by motion degradation although visualized  adequately on face CT. No acute cervical spine fracture. Chronic minimal regularity anterior margin C1 left lateral mass. Remote right clavicle fracture. Soft tissues and spinal canal: No abnormal prevertebral soft tissue swelling. Cervical spondylotic changes with various degrees of spinal stenosis and cord compression C2-3 thru C6-7. Cord flattening and compression most notable on the right at the C5-6 level. Upper chest: No acute abnormality. Other: No acute abnormality. IMPRESSION: CT HEAD Tiny anterior left parafalcine subdural hematoma. Chronic microvascular changes. Moderate global atrophy without hydrocephalus. CT MAXILLOFACIAL Right nasal bone fracture with minimal displacement. Remote left orbital floor fracture. Right frontal/supraorbital hematoma. Post lens replacement. Globes appear to be grossly intact. No right orbital fracture. CT CERVICAL SPINE No acute cervical spine fracture. Cervical spondylotic changes C2-3 thru C6-7 with significant cord flattening and compression most notable on the right at the C5-6 level (noted on prior exam). If there were any clinical suspicion of cord injury, MR may then be considered. These results were called by telephone at the time of interpretation on 12/09/2016 at 9:55 am to Dr. Pilar Plate Kayvan Hoefling , who verbally acknowledged these results. Electronically Signed   By: Genia Del M.D.   On: 12/09/2016 10:01    Procedures Procedures (including critical care time)  Medications Ordered in ED Medications  levETIRAcetam (KEPPRA) tablet 500 mg (500 mg Oral Given 12/09/16 1401)     Initial Impression / Assessment and Plan / ED Course  I have reviewed the triage vital signs and the nursing notes.  Pertinent labs & imaging results that were available during my care of the patient were reviewed by me and considered in my medical decision making (see chart for details).     83 year old female history of end-stage CHF and COPD baseline 2L [place] and currently in facility  under hospice care, DNR, A fib on eliquis, stroke, and recurrent falls who presents from facility for mechanical fall.  Today patient became tangled in oxygen cords and fell. She denies LOC. States she hit her R face and R shoulder on hard surface. Currently taking eliquis. Swelling over R frontal region. Reports pain and skin tear of R shoulder. States she had substernal reproducible chest discomfort overnight that prevented her from sleeping.   AF, VSS. Focal swelling, likely hematoma, over R frontal region superior to R eyebrow. EOM intact. No focal neuro deficits. Skin tear over R shoulder.  Imaging significant for tiny anterior L parafalcine SDH, right nasal bone fracture, cervical spondylotic changes with significant cord flattening, and closed R distal clavicular fracture.   NSY consulted and evaluated pt. Pt and daughter declining aggressive intervention. Pt remains neurologically intact, no further workup indicated for cervical cord findings. Daughter requesting admission due to concerns for current level of care at ALF. Spoke with pt's hospice nurse by phone to update on findings.  Hospitalist team evaluated patient and spoke with daughter. After extensive conversations by Hospitalist team with all members of pt's care team, decision made for [facility] with close f/u by hospice team.  Return precautions provided for worsening symptoms. Pt will f/u with PCP at first availability. Pt verbalized agreement with plan.  Pt care d/w Dr. Tyrone Nine  Final Clinical Impressions(s) / ED Diagnoses   Final diagnoses:  SDH (subdural hematoma) ([place])  Fall, initial encounter  Closed displaced fracture of acromial end of right clavicle, initial encounter    New Prescriptions Discharge Medication List as of 12/09/2016  1:26 PM       Payton Emerald, MD 12/09/16 [place], [place], DO 12/10/16 1035

## 2016-12-09 NOTE — ED Notes (Signed)
PT's daughter Mycala Warshawsky calls to report mother did not have a prescription for keppra to take home. Dr. Marily Memos made aware and given PT's information and daughter's phone number.

## 2016-12-09 NOTE — ED Notes (Signed)
Placed pt on bedpan, tolerated well. 

## 2016-12-09 NOTE — Consult Note (Signed)
Medical Consultation   Caroline Myers  WUJ:[id number]  DOB: 1923-12-19  DOA: 12/09/2016  PCP: Lawerance Cruel, MD   Outpatient Specialists:  Cardiology, neurology, hospice, podiatry   Requesting physician: Payton Emerald, EDP PA  Reason for consultation: Admission versus discharge   History of Present Illness: Caroline Myers is an 83 y.o. female CVA, rotator cuff tear, scoliosis, permanent nature fibrillation, GERD, COPD, CHF, asthma. Patient presented after what is described as a mechanical fall. Patient became entangled in her home oxygen tubing and fell striking her right shoulder and right side of her head. Patient endorses immediate pain which has been constant since onset. Patient denies any LOC, confusion, focal neurological deficit, chest pain, palpitation, nausea, vomiting, vertigo. Of note patient says is a hospice patient. Patient's last dose of Eliquis was at approximately 20:00 on 12/08/2016.   Review of Systems:  ROS As per HPI otherwise all other systems reviewed and are negative   Past Medical History: Past Medical History:  Diagnosis Date  . Allergic rhinitis   . Anxiety   . Arthritis    "neck, spine, hands" (06/28/2016)  . Asthma 2000   mild. tried Advair after bronchospasm after exposure to cats  . Atrial flutter ([place])   . Chronic combined systolic and diastolic CHF (congestive heart failure) ([place])   . COPD (chronic obstructive pulmonary disease) ([place])   . GERD (gastroesophageal reflux disease)    occ  . Intracranial hemorrhage following injury ([place]) 01/03/2015  . Normal coronary arteries    a. underwent treadmill stress test in April 2008 which shows normal perfusion study, however she had chest pain and EKG changes during the terminal portion concerning for false negative study. She had normal coronaries on cardiac catheterization in May 2008.   Marland Kitchen Permanent atrial fibrillation (HCC)    pt does not want anticoagulation  . Pneumonia  07/2015  . Scoliosis   . Severe aortic stenosis   . Shoulder pain, left 05/2010   with rotator cuff tear--Dr Alfonso Ramus  . Skin cancer of nose    "MOHS; think it was basal"  . Squamous carcinoma    "burned off my face"  . Stroke [facility]) 1/ 2014; 12/2014   denies residual on 06/28/2016    Past Surgical History: Past Surgical History:  Procedure Laterality Date  . CATARACT EXTRACTION W/ INTRAOCULAR LENS  IMPLANT, BILATERAL Bilateral   . DILATION AND CURETTAGE OF UTERUS    . HERNIA REPAIR  2003  . LUMBAR LAMINECTOMY/DECOMPRESSION MICRODISCECTOMY  04/01/2012   Procedure: LUMBAR LAMINECTOMY/DECOMPRESSION MICRODISCECTOMY 1 LEVEL;  Surgeon: Eustace Moore, MD;  Location: [place] NEURO ORS;  Service: Neurosurgery;  Laterality: Right;  Right Lumbar four-five extraforaminal microdiskectomy  . MOHS SURGERY     "nose"  . SHOULDER ARTHROSCOPY Right 2000   ACROMIOPLASTY  . TONSILLECTOMY       Allergies:   Allergies  Allergen Reactions  . Tape Other (See Comments)    Rips skin-paper tape  . Verapamil Other (See Comments)    Per MAR     Social History:  reports that she has never smoked. She has never used smokeless tobacco. She reports that she drinks alcohol. She reports that she does not use drugs.   Family History: Family History  Problem Relation Age of Onset  . Heart failure Father   . Stroke Sister   . Heart attack Neg Hx   . Hypertension Neg Hx  Physical Exam: Vitals:   12/09/16 1330 12/09/16 1345 12/09/16 1400 12/09/16 1415  BP: (!) 129/91 113/79 134/84 129/77  Pulse: 77 73 77 78  Resp: (!) 25 (!) 25 (!) 33 (!) 30  Temp:      TempSrc:      SpO2: 95% 96% 95% 95%    General:  Appears calm and comfortable Eyes: Right. Orbital region with marked hematoma but no injury to the orbit or sclera itself. PERRL, EOMI,  ENT:  grossly normal hearing, lips & tongue, mmm Neck:  no LAD, masses or thyromegaly Cardiovascular:  RRR, no m/r/g. No LE edema.  Respiratory:  CTA  bilaterally, no w/r/r. Normal respiratory effort. Abdomen:  soft, ntnd, NABS Skin: Right upper lip skin abrasion with right shoulder abrasion noted. Diffuse areas of ecchymoses across right upper extremity, right shoulder, right side of face. Musculoskeletal: Diminished movement of the right upper extremity due to before meals joint separation. Handgrip strength 5 out of 5 bilateral without pain or tenderness with palpation. Nasal bridge swollen with normal nasal septum, right periorbital area as described above. Psychiatric:  grossly normal mood and affect, speech fluent and appropriate, AOx3 Neurologic:  CN 2-12 grossly intact, moves all extremities in coordinated fashion, sensation intact  Data reviewed:  I have personally reviewed following labs and imaging studies Labs:  CBC:  Recent Labs Lab 12/09/16 1059 12/09/16 1111  WBC 9.7  --   NEUTROABS 7.8*  --   HGB 13.9 14.3  HCT 43.7 42.0  MCV 98.6  --   PLT 212  --     Basic Metabolic Panel:  Recent Labs Lab 12/09/16 1111  NA 139  K 4.0  CL 102  GLUCOSE 95  BUN 20  CREATININE 0.70   GFR CrCl cannot be calculated (Unknown ideal weight.). Liver Function Tests: No results for input(s): AST, ALT, ALKPHOS, BILITOT, PROT, ALBUMIN in the last 168 hours. No results for input(s): LIPASE, AMYLASE in the last 168 hours. No results for input(s): AMMONIA in the last 168 hours. Coagulation profile No results for input(s): INR, PROTIME in the last 168 hours.  Cardiac Enzymes: No results for input(s): CKTOTAL, CKMB, CKMBINDEX, TROPONINI in the last 168 hours. BNP: Invalid input(s): POCBNP CBG: No results for input(s): GLUCAP in the last 168 hours. D-Dimer No results for input(s): DDIMER in the last 72 hours. Hgb A1c No results for input(s): HGBA1C in the last 72 hours. Lipid Profile No results for input(s): CHOL, HDL, LDLCALC, TRIG, CHOLHDL, LDLDIRECT in the last 72 hours. Thyroid function studies No results for input(s):  TSH, T4TOTAL, T3FREE, THYROIDAB in the last 72 hours.  Invalid input(s): FREET3 Anemia work up No results for input(s): VITAMINB12, FOLATE, FERRITIN, TIBC, IRON, RETICCTPCT in the last 72 hours. Urinalysis    Component Value Date/Time   COLORURINE YELLOW 07/06/2016 1752   APPEARANCEUR CLEAR 07/06/2016 1752   LABSPEC 1.020 07/06/2016 1752   PHURINE 6.0 07/06/2016 1752   GLUCOSEU NEGATIVE 07/06/2016 1752   GLUCOSEU NEG mg/dL 10/09/2009 2126   HGBUR NEGATIVE 07/06/2016 1752   BILIRUBINUR NEGATIVE 07/06/2016 1752   KETONESUR NEGATIVE 07/06/2016 1752   PROTEINUR 30 (A) 07/06/2016 1752   UROBILINOGEN 0.2 02/14/2015 0435   NITRITE NEGATIVE 07/06/2016 1752   LEUKOCYTESUR TRACE (A) 07/06/2016 1752     Microbiology No results found for this or any previous visit (from the past 240 hour(s)).     Inpatient Medications:   Scheduled Meds: Continuous Infusions:   Radiological Exams on Admission: Dg Chest 2 View  Result Date: 12/09/2016 CLINICAL DATA:  Fall. EXAM: CHEST  2 VIEW COMPARISON:  Radiographs of August 29, 2016. FINDINGS: Stable cardiomegaly. Atherosclerosis of thoracic aorta is noted. No pneumothorax is noted. Narrowing of right subacromial space is noted suggesting rotator cuff injury. Mildly displaced distal right clavicular fracture is noted. Increased interstitial densities are noted in both lung bases concerning for mild pulmonary edema. Minimal bilateral pleural effusions are noted. Multilevel degenerative disc disease is noted in the thoracic spine. IMPRESSION: Aortic atherosclerosis. Mildly displaced distal right clavicular fracture. Findings concerning for mild pulmonary edema with minimal pleural effusions. Electronically Signed   By: Marijo Conception, M.D.   On: 12/09/2016 09:35   Dg Shoulder Right  Result Date: 12/09/2016 CLINICAL DATA:  Fall with right shoulder pain and abrasion. Initial encounter. EXAM: RIGHT SHOULDER - 2+ VIEW COMPARISON:  01/04/2016 FINDINGS: There  is a lucency through the upper aspect of the distal clavicle which is not convincing for an acute fracture, but was not clearly seen on July 2017 radiograph. Progressive widening of AC joint, 6 mm today. Chronic high humeral head from rotator cuff tear. Glenohumeral osteoarthritis. Osteopenia. IMPRESSION: 1. Wide and mildly offset acromioclavicular joint suspicious for separation. 2. Indeterminate lucency in the distal clavicle, although not definitive for acute fracture the appearance is new from 2017. Clavicle radiography may be helpful. 3. Chronic rotator cuff tear and glenohumeral osteoarthritis. 4. Osteopenia. Electronically Signed   By: Monte Fantasia M.D.   On: 12/09/2016 09:38   Ct Head Wo Contrast  Result Date: 12/09/2016 CLINICAL DATA:  83 year old female post fall tripping over oxygen tubing. Bleeding right eye/ nose. Initial encounter. EXAM: CT HEAD WITHOUT CONTRAST CT MAXILLOFACIAL WITHOUT CONTRAST CT CERVICAL SPINE WITHOUT CONTRAST TECHNIQUE: Multidetector CT imaging of the head, cervical spine, and maxillofacial structures were performed using the standard protocol without intravenous contrast. Multiplanar CT image reconstructions of the cervical spine and maxillofacial structures were also generated. COMPARISON:  07/06/2016. FINDINGS: CT HEAD FINDINGS Brain: Tiny anterior left parafalcine subdural hematoma. Chronic microvascular changes. Moderate global atrophy without hydrocephalus. No intracranial mass lesion noted on this unenhanced exam. Vascular: Vascular calcifications Skull: No skull fracture. Other: Moderately large hematoma right frontal/ supraorbital region. Right nasal bone fracture. Remote left orbital floor fracture. CT MAXILLOFACIAL FINDINGS Osseous: Right nasal bone fracture with minimal displacement. Orbits: Remote left orbital floor fracture. Sinuses: Minimal polypoid opacification left maxillary sinus. Soft tissues: Right frontal/supraorbital hematoma. Post lens replacement.  Globes appear to be grossly intact. CT CERVICAL SPINE FINDINGS Alignment: Reversal of the normal cervical lordosis. Anterior slip of C2 similar prior exam felt to be related to degenerative changes. Skull base and vertebrae: Evaluation of the upper cervical spine is limited by motion degradation although visualized adequately on face CT. No acute cervical spine fracture. Chronic minimal regularity anterior margin C1 left lateral mass. Remote right clavicle fracture. Soft tissues and spinal canal: No abnormal prevertebral soft tissue swelling. Cervical spondylotic changes with various degrees of spinal stenosis and cord compression C2-3 thru C6-7. Cord flattening and compression most notable on the right at the C5-6 level. Upper chest: No acute abnormality. Other: No acute abnormality. IMPRESSION: CT HEAD Tiny anterior left parafalcine subdural hematoma. Chronic microvascular changes. Moderate global atrophy without hydrocephalus. CT MAXILLOFACIAL Right nasal bone fracture with minimal displacement. Remote left orbital floor fracture. Right frontal/supraorbital hematoma. Post lens replacement. Globes appear to be grossly intact. No right orbital fracture. CT CERVICAL SPINE No acute cervical spine fracture. Cervical spondylotic changes C2-3 thru C6-7 with significant cord  flattening and compression most notable on the right at the C5-6 level (noted on prior exam). If there were any clinical suspicion of cord injury, MR may then be considered. These results were called by telephone at the time of interpretation on 12/09/2016 at 9:55 am to Dr. Pilar Plate MU , who verbally acknowledged these results. Electronically Signed   By: Genia Del M.D.   On: 12/09/2016 10:01   Ct Cervical Spine Wo Contrast  Result Date: 12/09/2016 CLINICAL DATA:  83 year old female post fall tripping over oxygen tubing. Bleeding right eye/ nose. Initial encounter. EXAM: CT HEAD WITHOUT CONTRAST CT MAXILLOFACIAL WITHOUT CONTRAST CT CERVICAL SPINE  WITHOUT CONTRAST TECHNIQUE: Multidetector CT imaging of the head, cervical spine, and maxillofacial structures were performed using the standard protocol without intravenous contrast. Multiplanar CT image reconstructions of the cervical spine and maxillofacial structures were also generated. COMPARISON:  07/06/2016. FINDINGS: CT HEAD FINDINGS Brain: Tiny anterior left parafalcine subdural hematoma. Chronic microvascular changes. Moderate global atrophy without hydrocephalus. No intracranial mass lesion noted on this unenhanced exam. Vascular: Vascular calcifications Skull: No skull fracture. Other: Moderately large hematoma right frontal/ supraorbital region. Right nasal bone fracture. Remote left orbital floor fracture. CT MAXILLOFACIAL FINDINGS Osseous: Right nasal bone fracture with minimal displacement. Orbits: Remote left orbital floor fracture. Sinuses: Minimal polypoid opacification left maxillary sinus. Soft tissues: Right frontal/supraorbital hematoma. Post lens replacement. Globes appear to be grossly intact. CT CERVICAL SPINE FINDINGS Alignment: Reversal of the normal cervical lordosis. Anterior slip of C2 similar prior exam felt to be related to degenerative changes. Skull base and vertebrae: Evaluation of the upper cervical spine is limited by motion degradation although visualized adequately on face CT. No acute cervical spine fracture. Chronic minimal regularity anterior margin C1 left lateral mass. Remote right clavicle fracture. Soft tissues and spinal canal: No abnormal prevertebral soft tissue swelling. Cervical spondylotic changes with various degrees of spinal stenosis and cord compression C2-3 thru C6-7. Cord flattening and compression most notable on the right at the C5-6 level. Upper chest: No acute abnormality. Other: No acute abnormality. IMPRESSION: CT HEAD Tiny anterior left parafalcine subdural hematoma. Chronic microvascular changes. Moderate global atrophy without hydrocephalus. CT  MAXILLOFACIAL Right nasal bone fracture with minimal displacement. Remote left orbital floor fracture. Right frontal/supraorbital hematoma. Post lens replacement. Globes appear to be grossly intact. No right orbital fracture. CT CERVICAL SPINE No acute cervical spine fracture. Cervical spondylotic changes C2-3 thru C6-7 with significant cord flattening and compression most notable on the right at the C5-6 level (noted on prior exam). If there were any clinical suspicion of cord injury, MR may then be considered. These results were called by telephone at the time of interpretation on 12/09/2016 at 9:55 am to Dr. Pilar Plate MU , who verbally acknowledged these results. Electronically Signed   By: Genia Del M.D.   On: 12/09/2016 10:01   Dg Hand Complete Left  Result Date: 12/09/2016 CLINICAL DATA:  Status post fall EXAM: LEFT HAND - COMPLETE 3+ VIEW COMPARISON:  07/05/2016 FINDINGS: Generalized osteopenia. Healed left fifth metacarpal neck fracture. Healed fracture of the base of the fourth proximal phalanx. No acute fracture or dislocation. Severe osteoarthritis of the first [facility] joint. Mild osteoarthritis of the scaphotrapeziotrapezoid joint. Mild osteoarthritis of the first MCP joint and first IP joint. Mild osteoarthritis of the PIP and DIP joints. Small periarticular erosion involving the base of the fifth proximal phalanx. IMPRESSION: 1.  No acute osseous injury of the left hand. 2. Osteoarthritis of the left hand as described  above. Electronically Signed   By: Kathreen Devoid   On: 12/09/2016 11:59   Ct Maxillofacial Wo Contrast  Result Date: 12/09/2016 CLINICAL DATA:  83 year old female post fall tripping over oxygen tubing. Bleeding right eye/ nose. Initial encounter. EXAM: CT HEAD WITHOUT CONTRAST CT MAXILLOFACIAL WITHOUT CONTRAST CT CERVICAL SPINE WITHOUT CONTRAST TECHNIQUE: Multidetector CT imaging of the head, cervical spine, and maxillofacial structures were performed using the standard protocol without  intravenous contrast. Multiplanar CT image reconstructions of the cervical spine and maxillofacial structures were also generated. COMPARISON:  07/06/2016. FINDINGS: CT HEAD FINDINGS Brain: Tiny anterior left parafalcine subdural hematoma. Chronic microvascular changes. Moderate global atrophy without hydrocephalus. No intracranial mass lesion noted on this unenhanced exam. Vascular: Vascular calcifications Skull: No skull fracture. Other: Moderately large hematoma right frontal/ supraorbital region. Right nasal bone fracture. Remote left orbital floor fracture. CT MAXILLOFACIAL FINDINGS Osseous: Right nasal bone fracture with minimal displacement. Orbits: Remote left orbital floor fracture. Sinuses: Minimal polypoid opacification left maxillary sinus. Soft tissues: Right frontal/supraorbital hematoma. Post lens replacement. Globes appear to be grossly intact. CT CERVICAL SPINE FINDINGS Alignment: Reversal of the normal cervical lordosis. Anterior slip of C2 similar prior exam felt to be related to degenerative changes. Skull base and vertebrae: Evaluation of the upper cervical spine is limited by motion degradation although visualized adequately on face CT. No acute cervical spine fracture. Chronic minimal regularity anterior margin C1 left lateral mass. Remote right clavicle fracture. Soft tissues and spinal canal: No abnormal prevertebral soft tissue swelling. Cervical spondylotic changes with various degrees of spinal stenosis and cord compression C2-3 thru C6-7. Cord flattening and compression most notable on the right at the C5-6 level. Upper chest: No acute abnormality. Other: No acute abnormality. IMPRESSION: CT HEAD Tiny anterior left parafalcine subdural hematoma. Chronic microvascular changes. Moderate global atrophy without hydrocephalus. CT MAXILLOFACIAL Right nasal bone fracture with minimal displacement. Remote left orbital floor fracture. Right frontal/supraorbital hematoma. Post lens replacement.  Globes appear to be grossly intact. No right orbital fracture. CT CERVICAL SPINE No acute cervical spine fracture. Cervical spondylotic changes C2-3 thru C6-7 with significant cord flattening and compression most notable on the right at the C5-6 level (noted on prior exam). If there were any clinical suspicion of cord injury, MR may then be considered. These results were called by telephone at the time of interpretation on 12/09/2016 at 9:55 am to Dr. Pilar Plate MU , who verbally acknowledged these results. Electronically Signed   By: Genia Del M.D.   On: 12/09/2016 10:01    Impression/Recommendations Active Problems:   Intracranial hemorrhage [facility])   Hospice care patient   Multiple contusions   Nasal fracture   AC separation, right, initial encounter   Fall at home, initial encounter  After extensive conversations with patient, patient's daughter, hospice team, and case management the decision has been made to have patient be discharged with close follow-up by hospice team at [facility]. Patient and patient's daughter aware that current medical problems are nonoperative and even if patient suffers worsening symptoms such as worsening intracranial hemorrhaging she would not want intervention. At this time would recommend placing patient in a right arm sling for comfort and support of her right before meals separation with outpatient physical therapy in order to maintain what function she still has. Patient may resume her Eliquis immediately per neurosurgical recommendations. Pt is to start Keppra 500BID x7 days for seizure prophylaxis. Pain management will be handled by hospice team who will be seeing the pt at [facility] this pm.   Thank you for this consultation.  Our [facility] hospitalist team will follow the patient with you.   Time Spent: >80 min.   Hermela Hardt J M.D. [facility] 12/09/2016, 5:39 PM

## 2016-12-09 NOTE — ED Notes (Signed)
Patient transported to X-ray 

## 2016-12-09 NOTE — Progress Notes (Signed)
Orthopedic Tech Progress Note Patient Details:  Caroline Myers 24-Dec-1923 [id number]  Ortho Devices Type of Ortho Device: Sling immobilizer Ortho Device/Splint Interventions: Application   [name] 12/09/2016, 1:47 PM

## 2016-12-09 NOTE — Progress Notes (Addendum)
[facility] Liaison:  RN visit  Visited with patient and daughter, [name], at bedside.  Patient alert and oriented.  Patient NAD at this time.  Patient has dried blood around nose, R eye swelling.  Has blood soaked gauze to R shoulder.  Patient states not in any pain at this time.  Elmyra Ricks, RN, was at bedside.   Daughter expressed concerns for patient's care that she is receiving at [facility] and would like to explore other options.  Will advise Elmyra Ricks, RN to have social work come and talk with family.   I will also speak with [facility] social work to advise.  If you have any questions related to hospice, please feel free to contact me.  Thank you,  Edyth Gunnels, RN, BSN [facility] Liaison [phone]  All hospital liaisons are now on [place].    **Update - Spoke with Dr. Marily Memos and utilization review - patient does not meet admission criteria and will be sent back to [facility].  If daughter's wishes are for placement somewhere else, they will have to do so from [facility].  Cannot hold for placement.  I did contact Monica, [facility] SW, who will contact daughter today.

## 2016-12-09 NOTE — Progress Notes (Signed)
[facility] Liaison:  RN  Spoke with Jerene Pitch, RN, who advised due to subdural hematoma and possible broken clavicle, patient will be admitted to observation.  Advised if needs to come into the hospital, will need admit vs. Obs.  She will discuss with MD.  I advised that I will come down and see the patient and talk with her at that time.  Thank you,  Edyth Gunnels, RN, BSN [facility] Liaison [phone]  All hospital liaisons are now on [place].

## 2016-12-09 NOTE — ED Triage Notes (Signed)
[name]- pt coming from [facility] after a fall. She reports she got tripped on her oxygen tubing. Pt denies LOC. She is alert and oriented X4. She has hematoma above her right eye and abrasion below the right eye. Some bleeding around the nose. She has skin tear to right shoulder as well as swelling and bruising the right hand.
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
## 2016-12-09 NOTE — ED Notes (Signed)
Pt is in stable condition upon d/c and is escorted to [facility] via [facility].

## 2016-12-11 DIAGNOSIS — I4891 Unspecified atrial fibrillation: Secondary | ICD-10-CM | POA: Diagnosis not present

## 2016-12-11 DIAGNOSIS — I509 Heart failure, unspecified: Secondary | ICD-10-CM | POA: Diagnosis not present

## 2016-12-11 DIAGNOSIS — R635 Abnormal weight gain: Secondary | ICD-10-CM | POA: Diagnosis not present

## 2016-12-11 DIAGNOSIS — J309 Allergic rhinitis, unspecified: Secondary | ICD-10-CM | POA: Diagnosis not present

## 2016-12-11 DIAGNOSIS — K219 Gastro-esophageal reflux disease without esophagitis: Secondary | ICD-10-CM | POA: Diagnosis not present

## 2016-12-11 DIAGNOSIS — J449 Chronic obstructive pulmonary disease, unspecified: Secondary | ICD-10-CM | POA: Diagnosis not present

## 2016-12-11 DIAGNOSIS — R269 Unspecified abnormalities of gait and mobility: Secondary | ICD-10-CM | POA: Diagnosis not present

## 2016-12-11 DIAGNOSIS — H1032 Unspecified acute conjunctivitis, left eye: Secondary | ICD-10-CM | POA: Diagnosis not present

## 2016-12-13 ENCOUNTER — Encounter (HOSPITAL_COMMUNITY): Payer: Self-pay | Admitting: Emergency Medicine

## 2016-12-13 ENCOUNTER — Observation Stay (HOSPITAL_COMMUNITY)
Admission: EM | Admit: 2016-12-13 | Discharge: 2016-12-15 | Disposition: A | Discharge status: Skilled Nursing Facility | Attending: Family Medicine | Admitting: Family Medicine

## 2016-12-13 ENCOUNTER — Encounter (HOSPITAL_COMMUNITY): Payer: Self-pay

## 2016-12-13 ENCOUNTER — Emergency Department (HOSPITAL_COMMUNITY)
Admission: EM | Admit: 2016-12-13 | Discharge: 2016-12-13 | Disposition: A | Discharge status: Home / Self Care | Attending: Emergency Medicine | Admitting: Emergency Medicine

## 2016-12-13 DIAGNOSIS — M19042 Primary osteoarthritis, left hand: Secondary | ICD-10-CM | POA: Insufficient documentation

## 2016-12-13 DIAGNOSIS — I350 Nonrheumatic aortic (valve) stenosis: Secondary | ICD-10-CM | POA: Diagnosis not present

## 2016-12-13 DIAGNOSIS — Z888 Allergy status to other drugs, medicaments and biological substances status: Secondary | ICD-10-CM | POA: Insufficient documentation

## 2016-12-13 DIAGNOSIS — I110 Hypertensive heart disease with heart failure: Secondary | ICD-10-CM | POA: Insufficient documentation

## 2016-12-13 DIAGNOSIS — D68318 Other hemorrhagic disorder due to intrinsic circulating anticoagulants, antibodies, or inhibitors: Secondary | ICD-10-CM | POA: Diagnosis not present

## 2016-12-13 DIAGNOSIS — I5084 End stage heart failure: Secondary | ICD-10-CM | POA: Diagnosis not present

## 2016-12-13 DIAGNOSIS — I4892 Unspecified atrial flutter: Secondary | ICD-10-CM | POA: Diagnosis not present

## 2016-12-13 DIAGNOSIS — I482 Chronic atrial fibrillation: Secondary | ICD-10-CM | POA: Insufficient documentation

## 2016-12-13 DIAGNOSIS — M419 Scoliosis, unspecified: Secondary | ICD-10-CM | POA: Insufficient documentation

## 2016-12-13 DIAGNOSIS — W19XXXD Unspecified fall, subsequent encounter: Secondary | ICD-10-CM | POA: Insufficient documentation

## 2016-12-13 DIAGNOSIS — S022XXD Fracture of nasal bones, subsequent encounter for fracture with routine healing: Secondary | ICD-10-CM | POA: Diagnosis not present

## 2016-12-13 DIAGNOSIS — I272 Pulmonary hypertension, unspecified: Secondary | ICD-10-CM | POA: Diagnosis not present

## 2016-12-13 DIAGNOSIS — S065X9D Traumatic subdural hemorrhage with loss of consciousness of unspecified duration, subsequent encounter: Secondary | ICD-10-CM | POA: Diagnosis not present

## 2016-12-13 DIAGNOSIS — Z85828 Personal history of other malignant neoplasm of skin: Secondary | ICD-10-CM | POA: Insufficient documentation

## 2016-12-13 DIAGNOSIS — J449 Chronic obstructive pulmonary disease, unspecified: Secondary | ICD-10-CM | POA: Insufficient documentation

## 2016-12-13 DIAGNOSIS — Z8673 Personal history of transient ischemic attack (TIA), and cerebral infarction without residual deficits: Secondary | ICD-10-CM | POA: Insufficient documentation

## 2016-12-13 DIAGNOSIS — E785 Hyperlipidemia, unspecified: Secondary | ICD-10-CM | POA: Diagnosis not present

## 2016-12-13 DIAGNOSIS — S42001D Fracture of unspecified part of right clavicle, subsequent encounter for fracture with routine healing: Secondary | ICD-10-CM | POA: Diagnosis not present

## 2016-12-13 DIAGNOSIS — Z66 Do not resuscitate: Secondary | ICD-10-CM | POA: Insufficient documentation

## 2016-12-13 DIAGNOSIS — M479 Spondylosis, unspecified: Secondary | ICD-10-CM | POA: Diagnosis not present

## 2016-12-13 DIAGNOSIS — T45515A Adverse effect of anticoagulants, initial encounter: Secondary | ICD-10-CM | POA: Insufficient documentation

## 2016-12-13 DIAGNOSIS — I5042 Chronic combined systolic (congestive) and diastolic (congestive) heart failure: Secondary | ICD-10-CM | POA: Diagnosis not present

## 2016-12-13 DIAGNOSIS — M19041 Primary osteoarthritis, right hand: Secondary | ICD-10-CM | POA: Insufficient documentation

## 2016-12-13 DIAGNOSIS — Z79899 Other long term (current) drug therapy: Secondary | ICD-10-CM | POA: Insufficient documentation

## 2016-12-13 DIAGNOSIS — R040 Epistaxis: Secondary | ICD-10-CM | POA: Insufficient documentation

## 2016-12-13 DIAGNOSIS — Z7901 Long term (current) use of anticoagulants: Secondary | ICD-10-CM | POA: Diagnosis not present

## 2016-12-13 DIAGNOSIS — F411 Generalized anxiety disorder: Secondary | ICD-10-CM | POA: Diagnosis not present

## 2016-12-13 DIAGNOSIS — K219 Gastro-esophageal reflux disease without esophagitis: Secondary | ICD-10-CM | POA: Diagnosis not present

## 2016-12-13 DIAGNOSIS — Z823 Family history of stroke: Secondary | ICD-10-CM | POA: Insufficient documentation

## 2016-12-13 DIAGNOSIS — Z8249 Family history of ischemic heart disease and other diseases of the circulatory system: Secondary | ICD-10-CM | POA: Diagnosis not present

## 2016-12-13 DIAGNOSIS — D6832 Hemorrhagic disorder due to extrinsic circulating anticoagulants: Secondary | ICD-10-CM | POA: Diagnosis not present

## 2016-12-13 DIAGNOSIS — Z515 Encounter for palliative care: Secondary | ICD-10-CM | POA: Insufficient documentation

## 2016-12-13 LAB — BASIC METABOLIC PANEL
ANION GAP: 5 (ref 5–15)
BUN: 31 mg/dL — AB (ref 6–20)
CHLORIDE: 104 mmol/L (ref 101–111)
CO2: 29 mmol/L (ref 22–32)
Calcium: 8.8 mg/dL — ABNORMAL LOW (ref 8.9–10.3)
Creatinine, Ser: 0.81 mg/dL (ref 0.44–1.00)
Glucose, Bld: 91 mg/dL (ref 65–99)
POTASSIUM: 4.2 mmol/L (ref 3.5–5.1)
Sodium: 138 mmol/L (ref 135–145)

## 2016-12-13 LAB — HEPATIC FUNCTION PANEL
ALT: 11 U/L — AB (ref 14–54)
AST: 19 U/L (ref 15–41)
Albumin: 3 g/dL — ABNORMAL LOW (ref 3.5–5.0)
Alkaline Phosphatase: 79 U/L (ref 38–126)
BILIRUBIN INDIRECT: 0.9 mg/dL (ref 0.3–0.9)
Bilirubin, Direct: 0.2 mg/dL (ref 0.1–0.5)
Total Bilirubin: 1.1 mg/dL (ref 0.3–1.2)
Total Protein: 6.1 g/dL — ABNORMAL LOW (ref 6.5–8.1)

## 2016-12-13 LAB — CBC WITH DIFFERENTIAL/PLATELET
BASOS ABS: 0.1 10*3/uL (ref 0.0–0.1)
BASOS PCT: 1 %
EOS ABS: 0.4 10*3/uL (ref 0.0–0.7)
EOS PCT: 5 %
HCT: 39.8 % (ref 36.0–46.0)
HEMOGLOBIN: 12.5 g/dL (ref 12.0–15.0)
Lymphocytes Relative: 14 %
Lymphs Abs: 1 10*3/uL (ref 0.7–4.0)
MCH: 31.3 pg (ref 26.0–34.0)
MCHC: 31.4 g/dL (ref 30.0–36.0)
MCV: 99.7 fL (ref 78.0–100.0)
Monocytes Absolute: 0.8 10*3/uL (ref 0.1–1.0)
Monocytes Relative: 11 %
NEUTROS PCT: 69 %
Neutro Abs: 4.8 10*3/uL (ref 1.7–7.7)
PLATELETS: 213 10*3/uL (ref 150–400)
RBC: 3.99 MIL/uL (ref 3.87–5.11)
RDW: 14.3 % (ref 11.5–15.5)
WBC: 7 10*3/uL (ref 4.0–10.5)

## 2016-12-13 LAB — PROTIME-INR
INR: 1.38
Prothrombin Time: 17.1 seconds — ABNORMAL HIGH (ref 11.4–15.2)

## 2016-12-13 LAB — APTT: APTT: 41 s — AB (ref 24–36)

## 2016-12-13 MED ORDER — KETOTIFEN FUMARATE 0.025 % OP SOLN
1.0000 [drp] | Freq: Two times a day (BID) | OPHTHALMIC | Status: DC
  Administered 2016-12-13 – 2016-12-15 (×4): 1 [drp] via OPHTHALMIC
  Filled 2016-12-13: qty 5

## 2016-12-13 MED ORDER — ALPRAZOLAM 0.25 MG PO TABS
0.2500 mg | ORAL_TABLET | Freq: Two times a day (BID) | ORAL | Status: DC
  Administered 2016-12-14 – 2016-12-15 (×4): 0.25 mg via ORAL
  Filled 2016-12-13 (×7): qty 1

## 2016-12-13 MED ORDER — SERTRALINE HCL 50 MG PO TABS
50.0000 mg | ORAL_TABLET | Freq: Every day | ORAL | Status: DC
  Administered 2016-12-13 – 2016-12-15 (×3): 50 mg via ORAL
  Filled 2016-12-13 (×3): qty 1

## 2016-12-13 MED ORDER — ENSURE ENLIVE PO LIQD
237.0000 mL | Freq: Two times a day (BID) | ORAL | Status: DC
  Administered 2016-12-14 – 2016-12-15 (×3): 237 mL via ORAL

## 2016-12-13 MED ORDER — FUROSEMIDE 20 MG PO TABS
10.0000 mg | ORAL_TABLET | Freq: Every day | ORAL | Status: DC
  Administered 2016-12-14 – 2016-12-15 (×2): 10 mg via ORAL
  Filled 2016-12-13 (×2): qty 1

## 2016-12-13 MED ORDER — POTASSIUM CHLORIDE CRYS ER 10 MEQ PO TBCR
10.0000 meq | EXTENDED_RELEASE_TABLET | Freq: Every day | ORAL | Status: DC
  Administered 2016-12-13 – 2016-12-15 (×3): 10 meq via ORAL
  Filled 2016-12-13 (×3): qty 1

## 2016-12-13 MED ORDER — ACETAMINOPHEN 325 MG PO TABS
650.0000 mg | ORAL_TABLET | Freq: Four times a day (QID) | ORAL | Status: DC | PRN
  Administered 2016-12-14: 650 mg via ORAL
  Filled 2016-12-13: qty 2

## 2016-12-13 MED ORDER — LEVETIRACETAM 500 MG PO TABS
500.0000 mg | ORAL_TABLET | Freq: Two times a day (BID) | ORAL | Status: DC
  Administered 2016-12-13 – 2016-12-15 (×4): 500 mg via ORAL
  Filled 2016-12-13 (×4): qty 1

## 2016-12-13 MED ORDER — IPRATROPIUM-ALBUTEROL 0.5-2.5 (3) MG/3ML IN SOLN: 3.0000 mL | Freq: Three times a day (TID) | RESPIRATORY_TRACT | Status: DC

## 2016-12-13 MED ORDER — ONDANSETRON HCL 4 MG PO TABS: 4.0000 mg | ORAL_TABLET | Freq: Four times a day (QID) | ORAL | Status: DC | PRN

## 2016-12-13 MED ORDER — SPIRONOLACTONE 25 MG PO TABS
12.5000 mg | ORAL_TABLET | Freq: Every day | ORAL | Status: DC
  Administered 2016-12-14 – 2016-12-15 (×2): 12.5 mg via ORAL
  Filled 2016-12-13 (×3): qty 1

## 2016-12-13 MED ORDER — POLYVINYL ALCOHOL 1.4 % OP SOLN
1.0000 [drp] | OPHTHALMIC | Status: DC | PRN
  Filled 2016-12-13: qty 15

## 2016-12-13 MED ORDER — ONDANSETRON HCL 4 MG/2ML IJ SOLN: 4.0000 mg | Freq: Four times a day (QID) | INTRAMUSCULAR | Status: DC | PRN

## 2016-12-13 MED ORDER — IPRATROPIUM-ALBUTEROL 0.5-2.5 (3) MG/3ML IN SOLN
3.0000 mL | Freq: Three times a day (TID) | RESPIRATORY_TRACT | Status: DC
  Administered 2016-12-13 – 2016-12-14 (×2): 3 mL via RESPIRATORY_TRACT
  Filled 2016-12-13 (×2): qty 3

## 2016-12-13 MED ORDER — OXYCODONE HCL 5 MG PO TABS
5.0000 mg | ORAL_TABLET | ORAL | Status: DC | PRN
  Filled 2016-12-13: qty 1

## 2016-12-13 MED ORDER — ALPRAZOLAM 0.25 MG PO TABS
0.2500 mg | ORAL_TABLET | Freq: Once | ORAL | Status: AC
  Administered 2016-12-13: 0.25 mg via ORAL
  Filled 2016-12-13: qty 1

## 2016-12-13 MED ORDER — POLYETHYLENE GLYCOL 3350 17 G PO PACK: 17.0000 g | PACK | Freq: Every day | ORAL | Status: DC | PRN

## 2016-12-13 MED ORDER — ACETAMINOPHEN 650 MG RE SUPP: 650.0000 mg | Freq: Four times a day (QID) | RECTAL | Status: DC | PRN

## 2016-12-13 MED ORDER — IPRATROPIUM-ALBUTEROL 0.5-2.5 (3) MG/3ML IN SOLN: 3.0000 mL | RESPIRATORY_TRACT | Status: DC | PRN

## 2016-12-13 NOTE — Progress Notes (Signed)
[facility] Liaison:  RN  Spoke with Bell, RN - [facility] had just arrived on unit to pick patient up.   I advised for future, hospice patients should go [facility].  If they are not yet on service they are to go by [facility].  Did not stop process as they were loading patient up.    Per Millie, RN, patient is stable and able to return to [facility].   ED able to get her nose to "mostly stop" bleeding.  Afrin and pressure recommended to staff to continue to control from ED to [facility] staff.  Thank you,  Edyth Gunnels, RN, BSN [facility] Liaison [phone]  All hospital liaisons are now on [place].

## 2016-12-13 NOTE — ED Notes (Signed)
Daughterr and facility state pt can return by EMS. [facility] contact requested.
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
## 2016-12-13 NOTE — ED Notes (Signed)
repolrt given to rn on 5 w

## 2016-12-13 NOTE — ED Triage Notes (Addendum)
Pt just d/c'd from [facility] with nosebleed. Pt returned with same, coughing up clots. Also has fractured R clavicle from previous fall.

## 2016-12-13 NOTE — Discharge Instructions (Signed)
Your nosebleed stopped after a couple of Occasions of Afrin and pressure.  Apply bacitracin ointment to right nostril at least twice daily.  You may have some recurrent short lasting nose bleeds from recent nasal bone fracture, being on eloquis, seasonal allergies and being on oxygen which can dry out your nasal mucosa.  If you have a recurrent nosebleed please apply pressure to very tip of nose for at least 20 minutes.    Return to the emergency department if you develop large volume, recurrent nose bleeds that leak to the back of your throat, light-headedness or any worsening or concerning symptoms.

## 2016-12-13 NOTE — Progress Notes (Signed)
[facility] Liaison:  RN visit  Received call from Vance Gather, [facility] RN, that patient was returning to ED.   Patient had started bleeding again in the facility and was promptly returned to ED.  Concern from [place], daughter, that patient didn't receive care at [facility] according to ED discharge instructions.  Family questioning if patient should go back to ALF.    Patient to ED with gauze in nose, still bleeding.  Patient had been coughing up clots also.  Per PA, patient to get a balloon placed and be admitted to the hospital.  Izora Gala, daughter, was in agreement with plan.  Updated Dr. Earlie Counts on plan in hospital.  Possible stopping Eliquis, placement of balloon and admission for a few days to the hospital.   Patient is concerned that she will not be able to attend her husband's memorial service which is scheduled for tomorrow.  Daughter assured her that someone would take video and include patient in the process as much as possible.  Patient is having a tough time coming to terms with her absence at that service.   Offered emotional support and prayers for family.    Please feel free to contact me with any hospice related questions.  Thank you,  Edyth Gunnels, RN, BSN [facility] Liaison [phone]  All hospital liaisons are now on [place].

## 2016-12-13 NOTE — ED Notes (Signed)
meds scheduled for  meds 1515  Are no longer in the med pyxis

## 2016-12-13 NOTE — ED Notes (Signed)
DC instructions given to NH and daughter. Pt home stable via [facility] per NH orders.

## 2016-12-13 NOTE — ED Notes (Signed)
Xanax 0.25mg  given po-- will scan when computer available-- residents at bedside

## 2016-12-13 NOTE — ED Notes (Signed)
The pt has been placed for a med surg bed  f family at the bedside has been notified

## 2016-12-13 NOTE — ED Notes (Signed)
Diischarge instructions discussed with daughter.

## 2016-12-13 NOTE — ED Notes (Signed)
Dr Constance Holster at the bedside

## 2016-12-13 NOTE — ED Triage Notes (Signed)
Pt comes via [facility] from [facility] for nosebleed that started around 145. Pt had a fall on Monday with bruising to face and R clavicle broken. Pt wears 3L of O2, bleeding is only coming from R nostril only. Pt is on Eliquis. PTA received 2-3 squirts of Afrin

## 2016-12-13 NOTE — ED Provider Notes (Signed)
[facility] DEPT Provider Note   CSN: [id number] Arrival date & time: 12/13/16  1102     History   Chief Complaint Chief Complaint  Patient presents with  . Epistaxis    HPI Caroline Myers is a 83 y.o. female with history of stroke, asthma/COPD, heart failure, atrial fibrillation on Eloquis is brought to the ED via [facility] with recurrent right-sided nosebleed. Patient was seen in the emergency department this morning for same by me. She was discharged after being monitored for an hour without recurrence of nosebleed. Reportedly patient started having nosebleed and [facility] again after discharge and EMS was called. Patient denies applying pressure to the tip of her nose prior to calling EMS. She lives in assisted living and does not have nursing assistance to help er.  No recent falls or face trauma since discharge from ED this morning. She has small SDH, R clavicular fx, right nasal bone fx from mechnical fall 4 days ago. Eloquis was not held after discharge from ED four days ago.   Larger amounts of nose bleed and coughing up blood per EMS en route to ED.   HPI   Past Medical History:  Diagnosis Date  . Allergic rhinitis   . Anxiety   . Arthritis    "neck, spine, hands" (06/28/2016)  . Asthma 2000   mild. tried Advair after bronchospasm after exposure to cats  . Atrial flutter ([place])   . Chronic combined systolic and diastolic CHF (congestive heart failure) ([place])   . COPD (chronic obstructive pulmonary disease) ([place])   . GERD (gastroesophageal reflux disease)    occ  . Intracranial hemorrhage following injury ([place]) 01/03/2015  . Normal coronary arteries    a. underwent treadmill stress test in April 2008 which shows normal perfusion study, however she had chest pain and EKG changes during the terminal portion concerning for false negative study. She had normal coronaries on cardiac catheterization in May 2008.   Marland Kitchen Permanent atrial fibrillation (HCC)      pt does not want anticoagulation  . Pneumonia 07/2015  . Scoliosis   . Severe aortic stenosis   . Shoulder pain, left 05/2010   with rotator cuff tear--Dr Alfonso Ramus  . Skin cancer of nose    "MOHS; think it was basal"  . Squamous carcinoma    "burned off my face"  . Stroke [facility]) 1/ 2014; 12/2014   denies residual on 06/28/2016    Patient Active Problem List   Diagnosis Date Noted  . Intracranial hemorrhage ([place]) 12/09/2016  . Hospice care patient 12/09/2016  . Multiple contusions 12/09/2016  . Nasal fracture 12/09/2016  . AC separation, right, initial encounter 12/09/2016  . Fall at home, initial encounter 12/09/2016  . Chronic combined systolic and diastolic CHF (congestive heart failure) ([place]) 09/19/2016  . Chest pain 08/29/2016  . Concussion 07/06/2016  . CHF exacerbation ([place]) 06/28/2016  . Respiratory failure with hypoxia ([place]) 06/28/2016  . URI (upper respiratory infection) 06/28/2016  . Chest pressure 06/28/2016  . Oral dyskinesia 06/28/2016  . Falls 05/20/2016  . Pulmonary hypertension ([place]) 03/15/2016  . Anxiety about health   . Cough   . Hypokalemia   . AKI (acute kidney injury) ([place])   . Debility 07/20/2015  . Sepsis ([place]) 07/20/2015  . History of CVA (cerebrovascular accident)   . Tachypnea   . Leukocytosis 07/17/2015  . Arterial hypotension   . Hypotension 07/16/2015  . Elevated troponin 07/16/2015  . PNA (pneumonia)   . Severe  sepsis ([place]) 07/15/2015  . Acute respiratory failure with hypercapnia ([place]) 07/15/2015  . Chronic anticoagulation 04/18/2015  . Orthostatic hypotension 03/09/2015  . CVA (cerebral infarction) 02/14/2015  . Stroke ([place]) 02/14/2015  . Stroke with cerebral ischemia ([place])   . Cerebral infarction due to embolism of left middle cerebral artery ([place])   . Occipital fracture ([place])   . Benign essential HTN   . Other allergic rhinitis   . Depression   . SAH (subarachnoid hemorrhage) ([place]) 01/02/2015  . SDH (subdural hematoma) ([place])  01/02/2015  . Aortic atherosclerosis ([place]) 08/05/2014  . Nonspecific abnormal unspecified cardiovascular function study 10/28/2013  . Severe aortic stenosis 10/28/2013  . HLD (hyperlipidemia) 07/28/2013  . TIA (transient ischemic attack) 07/27/2013  . History of CHF (congestive heart failure) 07/27/2013  . Urticaria 11/06/2010  . Bronchitis 10/12/2010  . Preventative health care   . Anxiety state 03/29/2010  . RIB PAIN, LEFT SIDED 10/12/2009  . ALLERGIC RHINITIS 06/28/2009  . SHOULDER PAIN, LEFT 06/28/2009  . SCIATICA, RIGHT 06/28/2009  . HEADACHE 06/28/2009  . DYSPNEA ON EXERTION 06/28/2009  . SKIN CANCER, HX OF 06/28/2009    Past Surgical History:  Procedure Laterality Date  . CATARACT EXTRACTION W/ INTRAOCULAR LENS  IMPLANT, BILATERAL Bilateral   . DILATION AND CURETTAGE OF UTERUS    . HERNIA REPAIR  2003  . LUMBAR LAMINECTOMY/DECOMPRESSION MICRODISCECTOMY  04/01/2012   Procedure: LUMBAR LAMINECTOMY/DECOMPRESSION MICRODISCECTOMY 1 LEVEL;  Surgeon: Eustace Moore, MD;  Location: [facility];  Service: Neurosurgery;  Laterality: Right;  Right Lumbar four-five extraforaminal microdiskectomy  . MOHS SURGERY     "nose"  . SHOULDER ARTHROSCOPY Right 2000   ACROMIOPLASTY  . TONSILLECTOMY      OB History    No data available       Home Medications    Prior to Admission medications   Medication Sig Start Date End Date Taking? Authorizing Provider  acetaminophen (TYLENOL) 325 MG tablet Take 650 mg by mouth every 4 (four) hours as needed for mild pain or fever.   Yes [provider]  acetaminophen (TYLENOL) 325 MG tablet Take 650 mg by mouth every 4 (four) hours as needed for mild pain.   Yes [provider]  acetaminophen (TYLENOL) 650 MG suppository Place 650 mg rectally every 4 (four) hours as needed for mild pain or fever.   Yes [provider]  ALPRAZolam (XANAX) 0.25 MG tablet Take 0.25 mg by mouth 2 (two) times daily.    Yes [provider]  ALPRAZolam (XANAX) 0.25 MG tablet Take 0.25 mg by mouth daily as needed for anxiety.   Yes [provider]  apixaban (ELIQUIS) 5 MG TABS tablet Take 1 tablet (5 mg total) by mouth 2 (two) times daily. 07/28/15  Yes Angiulli, Lavon Paganini, PA-C  ARTIFICIAL TEAR OP Place 1 drop into the right eye 4 (four) times daily.   Yes [provider]  azelastine (OPTIVAR) 0.05 % ophthalmic solution Place 1 drop into the left eye 2 (two) times daily.  11/15/16  Yes [provider]  furosemide (LASIX) 20 MG tablet Take 1 tablet (20 mg total) by mouth daily. Patient taking differently: Take 10 mg by mouth daily.  08/21/16  Yes Jettie Booze, MD  ipratropium-albuterol (DUONEB) 0.5-2.5 (3) MG/3ML SOLN Take 3 mLs by nebulization 3 (three) times daily.  11/14/16  Yes [provider]  ipratropium-albuterol (DUONEB) 0.5-2.5 (3) MG/3ML SOLN Take 3 mLs by nebulization every 4 (four) hours as needed (Shortness  of Breath).   Yes [provider]  levETIRAcetam (KEPPRA) 500 MG tablet Take 1 tablet (500 mg total) by mouth 2 (two) times daily. 12/09/16 12/16/16 Yes Quintella Reichert, MD  oxymetazoline (AFRIN) 0.05 % nasal spray Place 2 sprays into both nostrils 2 (two) times daily as needed for congestion.   Yes [provider]  potassium chloride (K-DUR) 10 MEQ tablet Take 1 tablet (10 mEq total) by mouth daily. 08/21/16  Yes Jettie Booze, MD  sertraline (ZOLOFT) 50 MG tablet Take 1 tablet (50 mg total) by mouth daily. 07/28/15  Yes Angiulli, Lavon Paganini, PA-C  spironolactone (ALDACTONE) 25 MG tablet Take 12.5 mg by mouth daily. Taken on Monday Wednesday and Friday 10/09/16  Yes [provider]  acetaminophen (TYLENOL) 500 MG tablet Take 1 tablet (500 mg total) by mouth every 6 (six) hours as needed. Patient not taking: Reported on 12/09/2016 08/31/16   Arrien, Jimmy Picket, MD  PROAIR HFA 108 [phone] Base) MCG/ACT inhaler Inhale 2 puffs into the lungs every  4 (four) hours as needed for wheezing or shortness of breath. 06/30/16   GhimireHenreitta Leber, MD    Family History Family History  Problem Relation Age of Onset  . Heart failure Father   . Stroke Sister   . Heart attack Neg Hx   . Hypertension Neg Hx     Social History Social History  Substance Use Topics  . Smoking status: Never Smoker  . Smokeless tobacco: Never Used  . Alcohol use 0.0 oz/week     Comment: 06/28/2016 "moved to assisted living 06/18/2016; nothing since then"     Allergies   Tape and Verapamil   Review of Systems Review of Systems  Constitutional: Negative for fever.  HENT: Positive for nosebleeds, postnasal drip and rhinorrhea.   Eyes: Negative for visual disturbance.  Respiratory: Negative for shortness of breath.   Cardiovascular: Negative for chest pain.  Gastrointestinal: Negative for abdominal pain, nausea and vomiting.  Genitourinary: Negative for difficulty urinating.  Musculoskeletal: Positive for arthralgias.  Skin: Positive for color change (from fall 4 days ago).  Allergic/Immunologic: Positive for immunocompromised state.  Neurological: Negative for syncope, light-headedness and headaches.  Psychiatric/Behavioral: Negative for confusion.     Physical Exam Updated Vital Signs BP 129/76 (BP Location: Left Arm)   Pulse 65   Temp 98.2 F (36.8 C) (Oral)   Resp 16   SpO2 100%   Physical Exam  Constitutional: She is oriented to person, place, and time. She appears well-developed and well-nourished. No distress.  Alert and oriented to place, self and time.   HENT:  Head: Normocephalic. Head is with contusion.  Nose: Rhinorrhea and sinus tenderness present. Epistaxis is observed.  Patient coughing up bright red blood and clots She has blood dripping in posterior oropharynx Afrin soaked gauze in right nostril Small clots in right nostril, removal did not reveal source of active bleeding  Nasal mucosa erythematous bilaterally Right  nasal bone tenderness Right periorbital and forehead ecchymosis   Eyes: Conjunctivae and EOM are normal. Pupils are equal, round, and reactive to light.  Neck: Neck supple.  Cardiovascular: Normal rate and intact distal pulses.  An irregular rhythm present.  Murmur heard. Pulses:      Radial pulses are 2+ on the right side, and 2+ on the left side.       Dorsalis pedis pulses are 2+ on the right side, and 2+ on the left side.  Irregular rhythm  Systolic murmur   Pulmonary/Chest: Effort  normal and breath sounds normal. No respiratory distress. She has no wheezes. She has no rales. She exhibits no tenderness.  Abdominal: Soft. Bowel sounds are normal. She exhibits no distension and no mass. There is no tenderness. There is no rebound and no guarding.  Musculoskeletal: Normal range of motion. She exhibits no deformity.  RUE in sling  Lymphadenopathy:    She has no cervical adenopathy.  Neurological: She is alert and oriented to person, place, and time. No sensory deficit.  Strength 5/5 in upper and lower extremities.   Sensation to light touch intact in upper and lower extremities.   Skin: Skin is warm and dry. Capillary refill takes less than 2 seconds.  Ecchymosis to distal RUE  Psychiatric: She has a normal mood and affect. Her behavior is normal. Judgment and thought content normal.  Nursing note and vitals reviewed.    ED Treatments / Results  Labs (all labs ordered are listed, but only abnormal results are displayed) Labs Reviewed  BASIC METABOLIC PANEL - Abnormal; Notable for the following:       Result Value   BUN 31 (*)    Calcium 8.8 (*)    All other components within normal limits  HEPATIC FUNCTION PANEL - Abnormal; Notable for the following:    Total Protein 6.1 (*)    Albumin 3.0 (*)    ALT 11 (*)    All other components within normal limits  PROTIME-INR - Abnormal; Notable for the following:    Prothrombin Time 17.1 (*)    All other components within normal  limits  APTT - Abnormal; Notable for the following:    aPTT 41 (*)    All other components within normal limits  CBC WITH DIFFERENTIAL/PLATELET    EKG  EKG Interpretation None       Radiology No results found.  Procedures .Epistaxis Management Date/Time: 12/13/2016 1:45 PM Performed by: Kinnie Feil Authorized by: Kinnie Feil   Consent:    Consent obtained:  Verbal   Consent given by:  Patient   Risks discussed:  Bleeding, infection, nasal injury and pain   Alternatives discussed:  Alternative treatment and observation Procedure details:    Treatment site:  R posterior and R anterior   Treatment method:  Anterior pack   Treatment complexity:  Limited   Treatment episode: recurring   Post-procedure details:    Assessment:  Bleeding decreased   Patient tolerance of procedure:  Tolerated well, no immediate complications   (including critical care time)  Medications Ordered in ED Medications - No data to display   Initial Impression / Assessment and Plan / ED Course  I have reviewed the triage vital signs and the nursing notes.  Pertinent labs & imaging results that were available during my care of the patient were reviewed by me and considered in my medical decision making (see chart for details).  Clinical Course as of Dec 13 1348  Fri Dec 13, 2016  1232 Spoke to family medicine MD, they would like to come up with plan to discharge patient so she can go to [employer]  [CG]    Clinical Course User Index [CG] Kinnie Feil, [place]   83 year old female returns to the ED for recurrent right epistaxis. She was seen in the ED this morning by me. This morning she was discharged after being observed for an hour without recurrent epistaxis, Afrin soaked gauze successfully slowed and stopped epistaxis this morning. Patient started rebleeding at the [facility], EMS was called. Patient lives in assisted living without nursing staff to  assist. She denies applying pressure to the nose prior to calling EMS. On exam bleeding appears to be more significant. She is actively coughing up blood and clots, she is speaking and in no respiratory distress. Blood in the posterior oropharynx. Hemodynamically stable.  Basic blood work unremarkable. Rapid Rhino inserted in the ED. Repeat evaluation and bleeding has slowed however patient continues to have dripping to the back of her throat.  Spoke to ENT who agrees with admission and rapid Rhino. They will evaluate patient's as inpatient. Consulted family medicine residents who will see patient in the emergency department. Anticipate admission. Family and both daughters are agreeable with admission. Eloquis will need to be held given SDH and epistaxis.   Patient, ED treatment and discharge plan was discussed with supervising physician who also evaluated the patient and is agreeable with plan.   Final Clinical Impressions(s) / ED Diagnoses   Final diagnoses:  Right-sided epistaxis  Chronic anticoagulation    New Prescriptions New Prescriptions   No medications on file     Arlean Hopping 12/13/16 0745    Kinnie Feil, PA-C 12/13/16 1350    Pattricia Boss, MD 12/16/16 2135

## 2016-12-13 NOTE — ED Notes (Signed)
Pt and family are asking if the pt will be admitted   At this point  There is a question  Will alert the pt and family whenever I get   An  abswer

## 2016-12-13 NOTE — ED Provider Notes (Signed)
[facility] DEPT Provider Note   CSN: [id number] Arrival date & time: 12/13/16  7564     History   Chief Complaint Chief Complaint  Patient presents with  . Epistaxis    HPI Caroline Myers is a 83 y.o. female with history of stroke, asthma/COPD, heart failure, atrial fibrillation on Eliquis is brought to the ED via EMS from [facility] with right-sided nosebleed 2 since yesterday. Daughter at bedside reports patient had a right nosebleed yesterday morning and this morning PTA. Patient reportedly coughed up some clear mucus with streaks of blood once time, no clots. Patient was in ED 4 days ago after fall which resulted in small SDH, right nasal bone fracture and right clavicular fracture. No recent falls or facial trauma since. Patient currently reports mild pain to the right shoulder. Otherwise feels okay. Denies headache, chest pain, abdominal pain, numbness in extremities, light-headedness. Patient is in hospice care. Daughter and patient are not interested in aggressive and invasive interventions. Patient has h/o allergic rhinitis, does not that any medications for it. No h/o anemia.   HPI  Past Medical History:  Diagnosis Date  . Allergic rhinitis   . Anxiety   . Arthritis    "neck, spine, hands" (06/28/2016)  . Asthma 2000   mild. tried Advair after bronchospasm after exposure to cats  . Atrial flutter ([place])   . Chronic combined systolic and diastolic CHF (congestive heart failure) ([place])   . COPD (chronic obstructive pulmonary disease) ([place])   . GERD (gastroesophageal reflux disease)    occ  . Intracranial hemorrhage following injury ([place]) 01/03/2015  . Normal coronary arteries    a. underwent treadmill stress test in April 2008 which shows normal perfusion study, however she had chest pain and EKG changes during the terminal portion concerning for false negative study. She had normal coronaries on cardiac catheterization in May 2008.   Marland Kitchen Permanent atrial  fibrillation (HCC)    pt does not want anticoagulation  . Pneumonia 07/2015  . Scoliosis   . Severe aortic stenosis   . Shoulder pain, left 05/2010   with rotator cuff tear--Dr Alfonso Ramus  . Skin cancer of nose    "MOHS; think it was basal"  . Squamous carcinoma    "burned off my face"  . Stroke [facility]) 1/ 2014; 12/2014   denies residual on 06/28/2016    Patient Active Problem List   Diagnosis Date Noted  . Intracranial hemorrhage ([place]) 12/09/2016  . Hospice care patient 12/09/2016  . Multiple contusions 12/09/2016  . Nasal fracture 12/09/2016  . AC separation, right, initial encounter 12/09/2016  . Fall at home, initial encounter 12/09/2016  . Chronic combined systolic and diastolic CHF (congestive heart failure) ([place]) 09/19/2016  . Chest pain 08/29/2016  . Concussion 07/06/2016  . CHF exacerbation ([place]) 06/28/2016  . Respiratory failure with hypoxia ([place]) 06/28/2016  . URI (upper respiratory infection) 06/28/2016  . Chest pressure 06/28/2016  . Oral dyskinesia 06/28/2016  . Falls 05/20/2016  . Pulmonary hypertension ([place]) 03/15/2016  . Anxiety about health   . Cough   . Hypokalemia   . AKI (acute kidney injury) ([place])   . Debility 07/20/2015  . Sepsis ([place]) 07/20/2015  . History of CVA (cerebrovascular accident)   . Tachypnea   . Leukocytosis 07/17/2015  . Arterial hypotension   . Hypotension 07/16/2015  . Elevated troponin 07/16/2015  . PNA (pneumonia)   . Severe sepsis ([place]) 07/15/2015  . Acute respiratory failure with hypercapnia ([place]) 07/15/2015  . Chronic  anticoagulation 04/18/2015  . Orthostatic hypotension 03/09/2015  . CVA (cerebral infarction) 02/14/2015  . Stroke ([place]) 02/14/2015  . Stroke with cerebral ischemia ([place])   . Cerebral infarction due to embolism of left middle cerebral artery ([place])   . Occipital fracture ([place])   . Benign essential HTN   . Other allergic rhinitis   . Depression   . SAH (subarachnoid hemorrhage) ([place]) 01/02/2015  . SDH  (subdural hematoma) ([place]) 01/02/2015  . Aortic atherosclerosis ([place]) 08/05/2014  . Nonspecific abnormal unspecified cardiovascular function study 10/28/2013  . Severe aortic stenosis 10/28/2013  . HLD (hyperlipidemia) 07/28/2013  . TIA (transient ischemic attack) 07/27/2013  . History of CHF (congestive heart failure) 07/27/2013  . Urticaria 11/06/2010  . Bronchitis 10/12/2010  . Preventative health care   . Anxiety state 03/29/2010  . RIB PAIN, LEFT SIDED 10/12/2009  . ALLERGIC RHINITIS 06/28/2009  . SHOULDER PAIN, LEFT 06/28/2009  . SCIATICA, RIGHT 06/28/2009  . HEADACHE 06/28/2009  . DYSPNEA ON EXERTION 06/28/2009  . SKIN CANCER, HX OF 06/28/2009    Past Surgical History:  Procedure Laterality Date  . CATARACT EXTRACTION W/ INTRAOCULAR LENS  IMPLANT, BILATERAL Bilateral   . DILATION AND CURETTAGE OF UTERUS    . HERNIA REPAIR  2003  . LUMBAR LAMINECTOMY/DECOMPRESSION MICRODISCECTOMY  04/01/2012   Procedure: LUMBAR LAMINECTOMY/DECOMPRESSION MICRODISCECTOMY 1 LEVEL;  Surgeon: Eustace Moore, MD;  Location: [facility];  Service: Neurosurgery;  Laterality: Right;  Right Lumbar four-five extraforaminal microdiskectomy  . MOHS SURGERY     "nose"  . SHOULDER ARTHROSCOPY Right 2000   ACROMIOPLASTY  . TONSILLECTOMY      OB History    No data available       Home Medications    Prior to Admission medications   Medication Sig Start Date End Date Taking? Authorizing Provider  acetaminophen (TYLENOL) 325 MG tablet Take 650 mg by mouth every 4 (four) hours as needed for mild pain or fever.    [provider]  acetaminophen (TYLENOL) 500 MG tablet Take 1 tablet (500 mg total) by mouth every 6 (six) hours as needed. Patient not taking: Reported on 12/09/2016 08/31/16   Arrien, Jimmy Picket, MD  acetaminophen (TYLENOL) 650 MG suppository Place 650 mg rectally every 4 (four) hours as needed for mild pain or fever.    [provider]  ALPRAZolam Duanne Moron) 0.25 MG tablet  Take 0.25 mg by mouth 2 (two) times daily.     [provider]  ALPRAZolam Duanne Moron) 0.25 MG tablet Take 0.25 mg by mouth daily as needed for anxiety.    [provider]  apixaban (ELIQUIS) 5 MG TABS tablet Take 1 tablet (5 mg total) by mouth 2 (two) times daily. 07/28/15   Angiulli, Lavon Paganini, PA-C  ARTIFICIAL TEAR OP Place 1 drop into the right eye 4 (four) times daily.    [provider]  azelastine (OPTIVAR) 0.05 % ophthalmic solution Place 1 drop into the left eye 2 (two) times daily.  11/15/16   [provider]  furosemide (LASIX) 20 MG tablet Take 1 tablet (20 mg total) by mouth daily. Patient taking differently: Take 10 mg by mouth daily.  08/21/16   Jettie Booze, MD  ipratropium-albuterol (DUONEB) 0.5-2.5 (3) MG/3ML SOLN Take 3 mLs by nebulization 3 (three) times daily.  11/14/16   [provider]  ipratropium-albuterol (DUONEB) 0.5-2.5 (3) MG/3ML SOLN Take 3 mLs by nebulization every 4 (four) hours as needed (Shortness of Breath).    [provider]  levETIRAcetam (KEPPRA) 500 MG tablet Take 1 tablet (500 mg total) by mouth 2 (two) times daily. 12/09/16 12/16/16  Quintella Reichert, MD  oxymetazoline (AFRIN) 0.05 % nasal spray Place 2 sprays into both nostrils 2 (two) times daily as needed for congestion.    [provider]  potassium chloride (K-DUR) 10 MEQ tablet Take 1 tablet (10 mEq total) by mouth daily. 08/21/16   Jettie Booze, MD  PROAIR HFA 108 [phone] Base) MCG/ACT inhaler Inhale 2 puffs into the lungs every 4 (four) hours as needed for wheezing or shortness of breath. 06/30/16   Ghimire, Henreitta Leber, MD  sertraline (ZOLOFT) 50 MG tablet Take 1 tablet (50 mg total) by mouth daily. 07/28/15   Angiulli, Lavon Paganini, PA-C  spironolactone (ALDACTONE) 25 MG tablet Take 12.5 mg by mouth daily. Taken on Monday Wednesday and Friday 10/09/16   [provider]    Family History Family History  Problem Relation Age of Onset    . Heart failure Father   . Stroke Sister   . Heart attack Neg Hx   . Hypertension Neg Hx     Social History Social History  Substance Use Topics  . Smoking status: Never Smoker  . Smokeless tobacco: Never Used  . Alcohol use 0.0 oz/week     Comment: 06/28/2016 "moved to assisted living 06/18/2016; nothing since then"     Allergies   Tape and Verapamil   Review of Systems Review of Systems  Constitutional: Negative for fever.  HENT: Positive for nosebleeds, postnasal drip and rhinorrhea.   Eyes: Negative for visual disturbance.  Respiratory: Negative for shortness of breath.   Cardiovascular: Negative for chest pain.  Gastrointestinal: Negative for abdominal pain, nausea and vomiting.  Genitourinary: Negative for difficulty urinating.  Musculoskeletal: Positive for arthralgias.  Skin: Positive for color change (from fall 4 days ago).  Allergic/Immunologic: Positive for immunocompromised state.  Neurological: Negative for syncope, light-headedness and headaches.  Psychiatric/Behavioral: Negative for confusion.     Physical Exam Updated Vital Signs BP 101/73   Pulse 77   Temp 98 F (36.7 C) (Oral)   Resp 16   SpO2 99%   Physical Exam  Constitutional: She is oriented to person, place, and time. She appears well-developed and well-nourished. No distress.  Alert and oriented to place, self and time.   HENT:  Head: Normocephalic. Head is with contusion.  Nose: Rhinorrhea and sinus tenderness present. Epistaxis is observed.  Afrin soaked gauze in right nostril Small clots in right nostril, removal did not reveal source of active bleeding  Nasal mucosa erythematous bilaterally Right nasal bone tenderness Right periorbital and forehead ecchymosis   Eyes: Conjunctivae and EOM are normal. Pupils are equal, round, and reactive to light.  Neck: Neck supple.  Cardiovascular: Normal rate and intact distal pulses.  An irregular rhythm present.  Murmur heard. Pulses:       Radial pulses are 2+ on the right side, and 2+ on the left side.       Dorsalis pedis pulses are 2+ on the right side, and 2+ on the left side.  Irregular rhythm  Systolic murmur   Pulmonary/Chest: Effort normal and breath sounds normal. No respiratory distress. She has no wheezes. She has no rales. She exhibits no tenderness.  Abdominal: Soft. Bowel sounds are normal. She exhibits no distension and no mass. There is no tenderness. There is no rebound and no guarding.  Musculoskeletal: Normal range of motion. She exhibits no deformity.  RUE in sling  Lymphadenopathy:    She has no cervical adenopathy.  Neurological: She is alert and oriented to person, place, and time. No sensory deficit.  Strength 5/5 in upper and lower extremities.   Sensation to light touch intact in upper and lower extremities.   Skin: Skin is warm and dry. Capillary refill takes less than 2 seconds.  Ecchymosis to distal RUE  Psychiatric: She has a normal mood and affect. Her behavior is normal. Judgment and thought content normal.  Nursing note and vitals reviewed.    ED Treatments / Results  Labs (all labs ordered are listed, but only abnormal results are displayed) Labs Reviewed - No data to display  EKG  EKG Interpretation None       Radiology No results found.  Procedures Procedures (including critical care time)  Medications Ordered in ED Medications - No data to display   Initial Impression / Assessment and Plan / ED Course  I have reviewed the triage vital signs and the nursing notes.  Pertinent labs & imaging results that were available during my care of the patient were reviewed by me and considered in my medical decision making (see chart for details).  Clinical Course as of Dec 14 743  Fri Dec 13, 2016  0731 Re-evaluated patient; removed gauze. No evidence of active bleeding no posterior oropharyngeal blood.   [CG]    Clinical Course User Index [CG] Kinnie Feil, PA-C    83 year old female presents with 2 episodes of right-sided epistaxis over the last 24 hours associated with one episode of coughing up clear sputum with streaks of blood. 4 days ago she sustained a fall leading to right nasal bone fracture, small subdural hematoma, right clavicular fracture.  No recent facial trauma or falls since. She is on Eliquis. Patient had received Afrin PTA. Upon initial evaluation bleeding had stopped, I removed small clots from the right knee or and I did not see any active bleeding or sources of anterior bleeds. No bleeding in posterior oropharynx. Repeat evaluation after 45 minutes of observation does not reveal repeat epistaxis. No history of anemia, last hemoglobin was within normal limits and stable 4 days ago. Patient considers it for discharge at this time. Provided education on home management of small epistaxis. Advised bacitracin ointment. The patient and daughter at bedside are agreeable with plan.   Discussed patient with supervising physician who is agreeable with ED treatment and discharge plan.  Final Clinical Impressions(s) / ED Diagnoses   Final diagnoses:  Right-sided epistaxis    New Prescriptions New Prescriptions   No medications on file     Arlean Hopping 12/13/16 0745    Gareth Morgan, MD 12/19/16 [phone]

## 2016-12-13 NOTE — H&P (Signed)
[facility] Admission History and Physical Service Pager: [phone]  Patient name: Caroline Myers Medical record number: [id number] Date of birth: 17-Mar-1924 Age: 83 y.o. Gender: female  Primary Care Provider: [facility] Consultants: ENT Code Status: DNR/DNI  Chief Complaint: nosebleed  Assessment and Plan: Caroline Myers is a 83 y.o. female presenting with nosebleed. PMH is significant for end stage CHF and COPD, atrial fibrillation on Eliquis, h/o stroke.  Posterior epistaxis with R nasal bone fracture. Secondary to fall that occurred on 12/09/16, was seen in the ED at that time with CT imaging showing R nasal bone fracture with minimal displacement, R frontal/supraorbital soft tissue hematoma, remote left orbital floor fracture, globes intact. Was deemed stable for discharge home at that time. Presented today with recurrent R sided epistaxis. Failed conservative management with afrin. S/p balloon packing in the ED. Hgb stable at 12.5 and O2 sat 95-100% on home 3L [place]. Follows with Dr. Melony Overly ENT. - Place in observation, FPTS attending Dr. Erin Hearing  - ENT curbsided, no ABX, packing in place for at least 5 days, observe overnight. Follow up outpatient  - Moist O2 for comfort (ordered as cool mist) - Keep head of bed elevated - am CBC  - Holding home Eliquis - monitor vitals - prn tylenol and prn norco for breakthrough  Small subdural hematoma. Also secondary to fall on 12/09/16. CT head tiny anterior left parafalcine subdural hematoma. Per ED provider note, neurosurgery evaluated and no further work indicated. Patient and daughter declined aggressive intervention at that time. Was started on Keppra for 7 days at that encounter. Still without focal neuro deficits. No concern for worsening bleed. Patient is under hospice care. - Holding anticoagulation - Continue Keppra 500mg  BID for a total of 7 days, last dose 12/16/16. - monitor  neuro status  End stage CHF/COPD under hospice care. On 3L via [place] at home chronically. Does not appear to be in exacerbation. At home on lasix 20mg  qd, spironolactone 25mg  qd, and KDur 10 mEq qd. Maintaining O2 sats between 95-100% on home 3L.  - duonebs - continue home medications. - hospice aware and following  Anxiety. At home on xanax 0.25 BID and zoloft 50mg  qd - continue home xanax and zoloft  AFib. On Eliquis at home. Not on any rate controlling medication but HR 65 - monitor vitals - holding anticoagulation for acute bleed    FEN/GI: regular diet  Prophylaxis: none d/t acute bleed   Disposition: place in observation  History of Present Illness:  Caroline Myers is a 83 y.o. female presenting with recurrent nosebleeds.   Patient had a fall on Monday, had nosebleed at that time, seen in the ED was told she had a nose fracture and went home and did well. However is now back twice today for continued R sided nosebleeds which started this morning when she got out of bed. No new trauma to nose. Has been coughing up blood that she feels running down the back of her throat. The first time she came into the ED this morning, it stopped and she went home and soon after getting home R nostril began bleeding again, not necessarily more than earlier but not less. Staff applied pressure for 20 minutes and didn't stop, then started having clots, so brought to ED. No lightheadedness, dizziness. No abd pain, no nausea, no vomiting. No breathing difficulty. No vision changes, no urinary changes. Follows with ENT Dr Melony Overly who has cauterized her nosebleeds  in the past but she cannot recall when.   Of note, her husband's memorial service is at 2:30pm tomorrow and although she regrets missing the service is amenable to staying. This was discussed at length with the patient and her daughter, who both voiced that trying to get the patient to the service would be more difficult on the family and  patient.    Review Of Systems: Per HPI with the following additions:   Review of Systems  HENT: Positive for nosebleeds. Negative for sore throat.   Eyes: Negative for blurred vision, double vision and photophobia.  Respiratory: Negative for shortness of breath and wheezing.   Cardiovascular: Negative for chest pain.  Gastrointestinal: Negative for abdominal pain, blood in stool, constipation, diarrhea, heartburn, melena, nausea and vomiting.  Genitourinary: Negative for dysuria, frequency, hematuria and urgency.  Neurological: Negative for dizziness, focal weakness, weakness and headaches.    Patient Active Problem List   Diagnosis Date Noted  . Intracranial hemorrhage ([place]) 12/09/2016  . Hospice care patient 12/09/2016  . Multiple contusions 12/09/2016  . Nasal fracture 12/09/2016  . AC separation, right, initial encounter 12/09/2016  . Fall at home, initial encounter 12/09/2016  . Chronic combined systolic and diastolic CHF (congestive heart failure) ([place]) 09/19/2016  . Chest pain 08/29/2016  . Concussion 07/06/2016  . CHF exacerbation ([place]) 06/28/2016  . Respiratory failure with hypoxia ([place]) 06/28/2016  . URI (upper respiratory infection) 06/28/2016  . Chest pressure 06/28/2016  . Oral dyskinesia 06/28/2016  . Falls 05/20/2016  . Pulmonary hypertension ([place]) 03/15/2016  . Anxiety about health   . Cough   . Hypokalemia   . AKI (acute kidney injury) ([place])   . Debility 07/20/2015  . Sepsis ([place]) 07/20/2015  . History of CVA (cerebrovascular accident)   . Tachypnea   . Leukocytosis 07/17/2015  . Arterial hypotension   . Hypotension 07/16/2015  . Elevated troponin 07/16/2015  . PNA (pneumonia)   . Severe sepsis ([place]) 07/15/2015  . Acute respiratory failure with hypercapnia ([place]) 07/15/2015  . Chronic anticoagulation 04/18/2015  . Orthostatic hypotension 03/09/2015  . CVA (cerebral infarction) 02/14/2015  . Stroke ([place]) 02/14/2015  . Stroke with cerebral ischemia  ([place])   . Cerebral infarction due to embolism of left middle cerebral artery ([place])   . Occipital fracture ([place])   . Benign essential HTN   . Other allergic rhinitis   . Depression   . SAH (subarachnoid hemorrhage) ([place]) 01/02/2015  . SDH (subdural hematoma) ([place]) 01/02/2015  . Aortic atherosclerosis ([place]) 08/05/2014  . Nonspecific abnormal unspecified cardiovascular function study 10/28/2013  . Severe aortic stenosis 10/28/2013  . HLD (hyperlipidemia) 07/28/2013  . TIA (transient ischemic attack) 07/27/2013  . History of CHF (congestive heart failure) 07/27/2013  . Urticaria 11/06/2010  . Bronchitis 10/12/2010  . Preventative health care   . Anxiety state 03/29/2010  . RIB PAIN, LEFT SIDED 10/12/2009  . ALLERGIC RHINITIS 06/28/2009  . SHOULDER PAIN, LEFT 06/28/2009  . SCIATICA, RIGHT 06/28/2009  . HEADACHE 06/28/2009  . DYSPNEA ON EXERTION 06/28/2009  . SKIN CANCER, HX OF 06/28/2009    Past Medical History: Past Medical History:  Diagnosis Date  . Allergic rhinitis   . Anxiety   . Arthritis    "neck, spine, hands" (06/28/2016)  . Asthma 2000   mild. tried Advair after bronchospasm after exposure to cats  . Atrial flutter ([place])   . Chronic combined systolic and diastolic CHF (congestive heart failure) ([place])   . COPD (chronic obstructive pulmonary disease) ([employer])   .  GERD (gastroesophageal reflux disease)    occ  . Intracranial hemorrhage following injury ([place]) 01/03/2015  . Normal coronary arteries    a. underwent treadmill stress test in April 2008 which shows normal perfusion study, however she had chest pain and EKG changes during the terminal portion concerning for false negative study. She had normal coronaries on cardiac catheterization in May 2008.   Marland Kitchen Permanent atrial fibrillation (HCC)    pt does not want anticoagulation  . Pneumonia 07/2015  . Scoliosis   . Severe aortic stenosis   . Shoulder pain, left 05/2010   with rotator cuff tear--Dr Alfonso Ramus  . Skin  cancer of nose    "MOHS; think it was basal"  . Squamous carcinoma    "burned off my face"  . Stroke [facility]) 1/ 2014; 12/2014   denies residual on 06/28/2016    Past Surgical History: Past Surgical History:  Procedure Laterality Date  . CATARACT EXTRACTION W/ INTRAOCULAR LENS  IMPLANT, BILATERAL Bilateral   . DILATION AND CURETTAGE OF UTERUS    . HERNIA REPAIR  2003  . LUMBAR LAMINECTOMY/DECOMPRESSION MICRODISCECTOMY  04/01/2012   Procedure: LUMBAR LAMINECTOMY/DECOMPRESSION MICRODISCECTOMY 1 LEVEL;  Surgeon: Eustace Moore, MD;  Location: [place] NEURO ORS;  Service: Neurosurgery;  Laterality: Right;  Right Lumbar four-five extraforaminal microdiskectomy  . MOHS SURGERY     "nose"  . SHOULDER ARTHROSCOPY Right 2000   ACROMIOPLASTY  . TONSILLECTOMY      Social History: Social History  Substance Use Topics  . Smoking status: Never Smoker  . Smokeless tobacco: Never Used  . Alcohol use 0.0 oz/week     Comment: 06/28/2016 "moved to assisted living 06/18/2016; nothing since then"   Additional social history: Under hospice care, lives in ALF.  Please also refer to relevant sections of EMR.  Family History: Family History  Problem Relation Age of Onset  . Heart failure Father   . Stroke Sister   . Heart attack Neg Hx   . Hypertension Neg Hx     Allergies and Medications: Allergies  Allergen Reactions  . Tape Other (See Comments)    Rips skin-paper tape  . Verapamil Other (See Comments)    Per MAR   No current facility-administered medications on file prior to encounter.    Current Outpatient Prescriptions on File Prior to Encounter  Medication Sig Dispense Refill  . acetaminophen (TYLENOL) 325 MG tablet Take 650 mg by mouth every 4 (four) hours as needed for mild pain or fever.    Marland Kitchen acetaminophen (TYLENOL) 650 MG suppository Place 650 mg rectally every 4 (four) hours as needed for mild pain or fever.    . ALPRAZolam (XANAX) 0.25 MG tablet Take 0.25 mg by mouth 2 (two) times  daily.     Marland Kitchen ALPRAZolam (XANAX) 0.25 MG tablet Take 0.25 mg by mouth daily as needed for anxiety.    Marland Kitchen apixaban (ELIQUIS) 5 MG TABS tablet Take 1 tablet (5 mg total) by mouth 2 (two) times daily. 180 tablet 3  . ARTIFICIAL TEAR OP Place 1 drop into the right eye 4 (four) times daily.    Marland Kitchen azelastine (OPTIVAR) 0.05 % ophthalmic solution Place 1 drop into the left eye 2 (two) times daily.   0  . furosemide (LASIX) 20 MG tablet Take 1 tablet (20 mg total) by mouth daily. (Patient taking differently: Take 10 mg by mouth daily. ) 90 tablet 3  . ipratropium-albuterol (DUONEB) 0.5-2.5 (3) MG/3ML SOLN Take 3 mLs by nebulization 3 (three) times  daily.   2  . ipratropium-albuterol (DUONEB) 0.5-2.5 (3) MG/3ML SOLN Take 3 mLs by nebulization every 4 (four) hours as needed (Shortness of Breath).    . levETIRAcetam (KEPPRA) 500 MG tablet Take 1 tablet (500 mg total) by mouth 2 (two) times daily. 14 tablet 0  . oxymetazoline (AFRIN) 0.05 % nasal spray Place 2 sprays into both nostrils 2 (two) times daily as needed for congestion.    . potassium chloride (K-DUR) 10 MEQ tablet Take 1 tablet (10 mEq total) by mouth daily. 90 tablet 3  . sertraline (ZOLOFT) 50 MG tablet Take 1 tablet (50 mg total) by mouth daily. 30 tablet 4  . spironolactone (ALDACTONE) 25 MG tablet Take 12.5 mg by mouth daily. Taken on Monday Wednesday and Friday  3  . acetaminophen (TYLENOL) 500 MG tablet Take 1 tablet (500 mg total) by mouth every 6 (six) hours as needed. (Patient not taking: Reported on 12/09/2016) 30 tablet 0  . PROAIR HFA 108 (90 Base) MCG/ACT inhaler Inhale 2 puffs into the lungs every 4 (four) hours as needed for wheezing or shortness of breath. 18 g 0    Objective: BP 129/76 (BP Location: Left Arm)   Pulse 65   Temp 98.2 F (36.8 C) (Oral)   Resp 16   SpO2 100%  Exam: General: Sitting up in bed, in NAD HEENT: Extensive bruising on R side of face especially around R eye and R nare with dried blood, balloon packing in  place in R nostril. Patient using suction and coughing. EOMI. MMM. Oropharynx nonerythematous and minimal blood visible. Neck: supple, normal ROM Cardiovascular: RRR, no murmurs Respiratory: CTAB, normal effort on 3L [place]. No wheezes or rhonchi Gastrointestinal: soft, nontender, nondistended, + bowel sounds MSK: R arm with extensive healing ecchymosis and in a sling. Good ROM in R hand. Otherwise 5/5 strength and normal ROM in all other extremities Derm: areas of ecchymoses documented as above, otherwise warm and dry Neuro: alert, oriented but occasionally forgetful. Clear speech, no focal deficits Psych: appropriate affect   Labs and Imaging: CBC BMET   Recent Labs Lab 12/13/16 1201  WBC 7.0  HGB 12.5  HCT 39.8  PLT 213    Recent Labs Lab 12/13/16 1201  NA 138  K 4.2  CL 104  CO2 29  BUN 31*  CREATININE 0.81  GLUCOSE 91  CALCIUM 8.8Bufford Lope, DO 12/13/2016, 1:54 PM PGY-1, [place] Intern pager: [phone], text pages welcome  UPPER LEVEL ADDENDUM  I have read the above note and made revisions highlighted in orange.  Adin Hector, MD, MPH PGY-2 [facility] Pager [phone]

## 2016-12-14 DIAGNOSIS — R040 Epistaxis: Secondary | ICD-10-CM | POA: Diagnosis not present

## 2016-12-14 DIAGNOSIS — Z7901 Long term (current) use of anticoagulants: Secondary | ICD-10-CM | POA: Diagnosis not present

## 2016-12-14 LAB — CBC
HEMATOCRIT: 39.7 % (ref 36.0–46.0)
HEMOGLOBIN: 12.3 g/dL (ref 12.0–15.0)
MCH: 31.1 pg (ref 26.0–34.0)
MCHC: 31 g/dL (ref 30.0–36.0)
MCV: 100.5 fL — AB (ref 78.0–100.0)
Platelets: 226 10*3/uL (ref 150–400)
RBC: 3.95 MIL/uL (ref 3.87–5.11)
RDW: 14.3 % (ref 11.5–15.5)
WBC: 9.5 10*3/uL (ref 4.0–10.5)

## 2016-12-14 MED ORDER — ACETAMINOPHEN 325 MG PO TABS
650.0000 mg | ORAL_TABLET | Freq: Three times a day (TID) | ORAL | Status: DC
  Administered 2016-12-14 – 2016-12-15 (×2): 650 mg via ORAL
  Filled 2016-12-14 (×3): qty 2

## 2016-12-14 MED ORDER — ACETAMINOPHEN 325 MG PO TABS
650.0000 mg | ORAL_TABLET | Freq: Three times a day (TID) | ORAL | Status: DC | PRN
  Administered 2016-12-14: 650 mg via ORAL
  Filled 2016-12-14: qty 2

## 2016-12-14 NOTE — Progress Notes (Signed)
Family Medicine Teaching Service Daily Progress Note Intern Pager: [phone]  Patient name: Caroline Myers Medical record number: [id number] Date of birth: 1923-10-31 Age: 83 y.o. Gender: female  Primary Care Provider: [facility] Consultants: none (ENT curbside) Code Status: FULL  Pt Overview and Major Events to Date:  6/15: admit   Assessment and Plan: [name] is a 83 y.o. female presenting with nosebleed. PMH is significant for end stage CHF and COPD, atrial fibrillation on Eliquis, h/o stroke.  Posterior epistaxis with R nasal bone fracture.  S/p balloon packing in the ED. Follows with Dr. Melony Overly ENT. Had bleeding overnight; if she continues to have bleeding she may have to stay here. Hemoglobin is stable. Will discuss with attending.  - ENT curbsided, no ABX, packing in place for at least 5 days, observe overnight. Follow up outpatient  - Moist O2 for comfort (ordered as cool mist) - Keep head of bed elevated - Holding home Eliquis - monitor vitals - prn tylenol and prn norco for breakthrough  Small subdural hematoma. Also secondary to fall on 12/09/16. CT head tiny anterior left parafalcine subdural hematoma. Per ED provider note, neurosurgery evaluated and no further work indicated. Patient and daughter declined aggressive intervention at that time. Was started on Keppra for 7 days at that encounter. Still without focal neuro deficits. No concern for worsening bleed. Patient is under hospice care. - Holding anticoagulation - Continue Keppra 500mg  BID for a total of 7 days, last dose 12/16/16. - monitor neuro status  End stage CHF/COPD under hospice care. On 3L via [place] at home chronically. Does not appear to be in exacerbation. At home on lasix 20mg  qd, spironolactone 25mg  qd, and KDur 10 mEq qd. Maintaining O2 sats between 95-100% on home 3L.  - duonebs - continue home medications. - hospice aware and following  Anxiety. At home on xanax  0.25 BID and zoloft 50mg  qd - continue home xanax and zoloft  AFib. On Eliquis at home. Not on any rate controlling medication but HR 65 - monitor vitals - holding anticoagulation for acute bleed    FEN/GI: regular diet  Prophylaxis: none d/t acute bleed   Disposition: continued obeservation   Subjective:  Patient has about two episode of re-bleeding overnight which improved with further inflation of the balloon. Reports she wishes she had not come here.   Objective: Temp:  [97.7 F (36.5 C)-98.2 F (36.8 C)] 97.7 F (36.5 C) (06/16 0547) Pulse Rate:  [59-91] 91 (06/16 0547) Resp:  [16-18] 18 (06/16 0547) BP: (100-141)/(63-90) 126/80 (06/16 0547) SpO2:  [94 %-100 %] 96 % (06/16 3710) Physical Exam: General: Sitting up in bed, in NAD HEENT: Extensive bruising on R side of face especially around R eye and R nare with dried blood, balloon packing in place in R nostril along with extra gauze on the right nostril with some dried blood.  EOMI. MMM. Oropharynx nonerythematous and minimal blood visible. Cardiovascular: RRR, no murmurs Respiratory: CTAB, normal effort on 3L [place]. No wheezes or rhonchi Gastrointestinal: soft, nontender, nondistended, + bowel sounds Derm: areas of ecchymoses documented as above, otherwise warm and dry Neuro: alert, Clear speech, no focal deficits Psych: appropriate affect   Laboratory:  Recent Labs Lab 12/09/16 1059 12/09/16 1111 12/13/16 1201 12/14/16 0619  WBC 9.7  --  7.0 9.5  HGB 13.9 14.3 12.5 12.3  HCT 43.7 42.0 39.8 39.7  PLT 212  --  213 226    Recent Labs Lab 12/09/16 1111 12/13/16  1201  NA 139 138  K 4.0 4.2  CL 102 104  CO2  --  29  BUN 20 31*  CREATININE 0.70 0.81  CALCIUM  --  8.8*  PROT  --  6.1*  BILITOT  --  1.1  ALKPHOS  --  79  ALT  --  11*  AST  --  19  GLUCOSE 95 91    Smiley Houseman, MD 12/14/2016, 9:08 AM PGY-2, [place] Intern pager: [phone], text pages welcome

## 2016-12-14 NOTE — Progress Notes (Signed)
Nutrition Brief Note  Patient identified on the Malnutrition Screening Tool (MST) Report  Wt Readings from Last 15 Encounters:  09/27/16 136 lb (61.7 kg)  09/19/16 132 lb (59.9 kg)  08/29/16 131 lb (59.4 kg)  08/21/16 134 lb 12.8 oz (61.1 kg)  08/11/16 133 lb (60.3 kg)  07/05/16 133 lb (60.3 kg)  06/29/16 132 lb 6.4 oz (60.1 kg)  05/20/16 137 lb 12.8 oz (62.5 kg)  05/09/16 138 lb (62.6 kg)  04/15/16 137 lb (62.1 kg)  03/14/16 138 lb 1.9 oz (62.7 kg)  02/13/16 137 lb (62.1 kg)  09/21/15 142 lb (64.4 kg)  07/29/15 145 lb 15.1 oz (66.2 kg)  07/20/15 159 lb (72.1 kg)   Pt is under hospice care, admitted due to nosebleed  RD visited to optimize ensure her nutritional desires are being met.   Pt was asleep, but family friend at bedside communicated multiple different food requests/preferences. These were passed to dietary.   Friend did state that the pt will drink Ensure. Will order to help her maintain strength/QOL.   Current diet order is regular. Her lunch tray was at bedside and had eaten 50% with caregivers assistance.   If nutrition issues arise, please consult RD.   Burtis Junes RD, LDN, CNSC Clinical Nutrition Pager: [phone] 12/14/2016 4:47 PM

## 2016-12-14 NOTE — Care Management Obs Status (Signed)
[facility] NOTIFICATION   Patient Details  Name: Caroline Myers MRN: [id number] Date of Birth: April 11, 1924   Medicare Observation Status Notification Given:  Yes    Dellie Catholic, RN 12/14/2016, 5:43 PM

## 2016-12-14 NOTE — Progress Notes (Signed)
Night resident was paged around 2:15 am by the nurse reporting slow bleed from patient right nostril around balloon packing. Team went to examine patient and noted slow oozing from right nare around balloon packing. Pressure was applied in a attempt to stop bleeding. Air was added to balloon to increase volume and promote additional hemostasis. Bleeding decreased. Nasal canula prong was taken out of right nostril as well.   Marjie Skiff, MD Family Medicine PGY-1

## 2016-12-14 NOTE — Progress Notes (Signed)
Patient ID: Caroline Myers, female   DOB: 03-09-1924, 83 y.o.   MRN: [id number] ENT follow-up.  2 additional Episodes of bleeding during the night. Additional air applied to the balloon packing. Nasal prong oxygen was removed from the right side.  On rounds today she is not having any bleeding. Packing in place. She still has nasal prong oxygen in place I would strongly recommend we stop the nasal prong oxygen as this only dries out the nasal mucosa and confirmed of the problem. As long as there is no ongoing active bleeding would keep the packing in for a total of 5 days. She may follow up in our office for packing removal. If there is no further bleeding or other problems today she may return to her facility.

## 2016-12-14 NOTE — Care Management (Signed)
0908 ON CALL GIP RN VISIT - [facility]W [facility] - GIP - RN Visit This is a related, covered GIP admission from 10/12/16 to [facility] with diagnosis of Heart disease, per MD Mazzochi.  Code status:  DNR/DNI.  Patient admitted on 12/13/16 to [facility] with epistaxis.  Patient is s/p fall at the facility on 12/09/16 with right nasal bone fracture.  She presented with recurrent right sided epistaxis; failed conservative management with Afrin.  Balloon packing was performed in the ED.  O2 3L per [facility].  Home medication of Eliquis is on hold.  Noted small subdural hematoma; she will continue Keppra 531m bid for total of (7) days, last dose 12/16/16.  Ms. SBenneris admitted for observation of this recurrent epistaxis with fracture.  Follow up by MD J. RConstance Holster he agrees with observatory stay and stop of anticoagulation therapy for a few days.  Packing will remain for (5) days and if there is no further bleeding removal can be at the hospital or in the office; per MD RConstance Holster  HS notation from MD Abdoulaye; night resident was paged at [place] the nurse for slow bleed around the nasal packing.  Slow oozing was noted and pressure was applied as well as air added to the balloon to increase the volume and promote hemostasis.  Nasal cannula prong was removed from this nare at this time.  Labs 12/13/16:  BUN 31 and Calcium 8.8.  [facility] RN met with patient this AM and talked with her.  She notes that she "feels a little better" today, but still not her best.  She is noted with balloon packing to her right nare, OD is closed; probable due to trauma from fall on 12/09/16, her right arm continues in a sling.  She is awake and alert and attempting to eat her AM meal today.  She needs/requires assistance with this.  I do sit and help her eat.  She requires frequent rest breaks to allow her to breath thru he mouth as well.   [facility] RN met with staff RN SAldona Bartoday; no acute concerns to note.  She  does ask if patient will return to [facility]?  I tell her that I am uncertain at this time, but [facility] will continue to follow and assist with appropriate discharge plans.  RN acknowledges.  She discusses that family is requesting possible leave or discharge for patient to go to a memorial service for her deceased husband today.  RN will discuss further with MD today on rounds.  I have asked SAldona Barto have nurse tech assist with meals due to her sling and impaired vision.  She agrees and tech has been notified.    Please call with any hospice-related questions or concerns  Thank you, CClaire Shown WMingo AmberRN MSN HLouannRN  [phone]

## 2016-12-15 DIAGNOSIS — R040 Epistaxis: Secondary | ICD-10-CM | POA: Diagnosis not present

## 2016-12-15 MED ORDER — FUROSEMIDE 20 MG PO TABS: 10.0000 mg | ORAL_TABLET | Freq: Every day | ORAL | 0 refills | Status: DC

## 2016-12-15 NOTE — Discharge Instructions (Signed)
It is very important to call Dr. Pollie Friar office first thing tomorrow morning to schedule a hospital follow-up appointment.   Caroline Myers should keep the nasal packing in place until she is told otherwise by Dr. Lucia Gaskins. We have discontinued her Eliquis for right now, and she should also continue to hold this until Dr. Lucia Gaskins says it is fine for her to begin taking it again.   Please also schedule an appointment with her primary doctor within the next week for hospital follow-up.   She should continue taking Keppra until 06/19 as recommended by the neurologist after her fall for the brain bleed.

## 2016-12-15 NOTE — NC FL2 (Signed)
Caroline Myers LEVEL OF CARE SCREENING TOOL     IDENTIFICATION  Patient Name: Caroline Myers Birthdate: 1923/12/25 Sex: female Admission Date (Current Location): 12/13/2016  [facility] and [place] Number:  Personnel officer and Address:  The . [facility], [place] [address] [place], [place] [address]      Provider Number: [id number]  Attending Physician Name and Address:  Lind Covert, MD  Relative Name and Phone Number:       Current Level of Care: Hospital Recommended Level of Care: [place] Prior Approval Number:    Date Approved/Denied:   PASRR Number:    Discharge Plan: Domiciliary (Rest home)    Current Diagnoses: Patient Active Problem List   Diagnosis Date Noted  . Right-sided epistaxis 12/13/2016  . Intracranial hemorrhage ([place]) 12/09/2016  . Hospice care patient 12/09/2016  . Multiple contusions 12/09/2016  . Nasal fracture 12/09/2016  . AC separation, right, initial encounter 12/09/2016  . Fall at home, initial encounter 12/09/2016  . Chronic combined systolic and diastolic CHF (congestive heart failure) ([place]) 09/19/2016  . Chest pain 08/29/2016  . Concussion 07/06/2016  . CHF exacerbation ([place]) 06/28/2016  . Respiratory failure with hypoxia ([place]) 06/28/2016  . URI (upper respiratory infection) 06/28/2016  . Chest pressure 06/28/2016  . Oral dyskinesia 06/28/2016  . Falls 05/20/2016  . Pulmonary hypertension ([place]) 03/15/2016  . Anxiety about health   . Cough   . Hypokalemia   . AKI (acute kidney injury) ([place])   . Debility 07/20/2015  . Sepsis ([place]) 07/20/2015  . History of CVA (cerebrovascular accident)   . Tachypnea   . Leukocytosis 07/17/2015  . Arterial hypotension   . Hypotension 07/16/2015  . Elevated troponin 07/16/2015  . PNA (pneumonia)   . Severe sepsis ([place]) 07/15/2015  . Acute respiratory failure with hypercapnia ([place]) 07/15/2015  . Chronic anticoagulation 04/18/2015  .  Orthostatic hypotension 03/09/2015  . CVA (cerebral infarction) 02/14/2015  . Stroke ([place]) 02/14/2015  . Stroke with cerebral ischemia ([place])   . Cerebral infarction due to embolism of left middle cerebral artery ([place])   . Occipital fracture ([place])   . Benign essential HTN   . Other allergic rhinitis   . Depression   . SAH (subarachnoid hemorrhage) ([place]) 01/02/2015  . SDH (subdural hematoma) ([place]) 01/02/2015  . Aortic atherosclerosis ([place]) 08/05/2014  . Nonspecific abnormal unspecified cardiovascular function study 10/28/2013  . Severe aortic stenosis 10/28/2013  . HLD (hyperlipidemia) 07/28/2013  . TIA (transient ischemic attack) 07/27/2013  . History of CHF (congestive heart failure) 07/27/2013  . Urticaria 11/06/2010  . Bronchitis 10/12/2010  . Preventative health care   . Anxiety state 03/29/2010  . RIB PAIN, LEFT SIDED 10/12/2009  . ALLERGIC RHINITIS 06/28/2009  . SHOULDER PAIN, LEFT 06/28/2009  . SCIATICA, RIGHT 06/28/2009  . HEADACHE 06/28/2009  . DYSPNEA ON EXERTION 06/28/2009  . SKIN CANCER, HX OF 06/28/2009    Orientation RESPIRATION BLADDER Height & Weight     Self, Place  Normal Continent Weight: 126 lb 15.8 oz (57.6 kg) Height:  5\' 5"  (165.1 cm)  BEHAVIORAL SYMPTOMS/MOOD NEUROLOGICAL BOWEL NUTRITION STATUS      Continent Diet (Regular diet; Thin fluids)  AMBULATORY STATUS COMMUNICATION OF NEEDS Skin   Limited Assist Verbally Normal                       Personal Care Assistance Level of Assistance  Bathing, Feeding, Dressing Bathing Assistance: Maximum assistance Feeding assistance: Limited  assistance Dressing Assistance: Maximum assistance     Functional Limitations Info  Sight, Hearing, Speech Sight Info: Adequate Hearing Info: Adequate Speech Info: Adequate    SPECIAL CARE FACTORS FREQUENCY                       Contractures Contractures Info: Not present    Additional Factors Info  Code Status, Allergies, Psychotropic Code  Status Info: DNR Allergies Info: Tape, Verapamil Psychotropic Info: See MAR         Current Medications (12/15/2016):  This is the current hospital active medication list Current Facility-Administered Medications  Medication Dose Route Frequency Provider Last Rate Last Dose  . acetaminophen (TYLENOL) tablet 650 mg  650 mg Oral Q6H PRN Orson Eva J, DO   650 mg at 12/14/16 1017   Or  . acetaminophen (TYLENOL) suppository 650 mg  650 mg Rectal Q6H PRN Bufford Lope, DO      . acetaminophen (TYLENOL) tablet 650 mg  650 mg Oral Q8H Chambliss, Jeb Levering, MD   650 mg at 12/15/16 0559  . ALPRAZolam Duanne Moron) tablet 0.25 mg  0.25 mg Oral BID Orson Eva J, DO   0.25 mg at 12/15/16 5188  . feeding supplement (ENSURE ENLIVE) (ENSURE ENLIVE) liquid 237 mL  237 mL Oral BID BM Chambliss, Marshall L, MD   237 mL at 12/15/16 1000  . furosemide (LASIX) tablet 10 mg  10 mg Oral Daily Orson Eva J, DO   10 mg at 12/15/16 4166  . ipratropium-albuterol (DUONEB) 0.5-2.5 (3) MG/3ML nebulizer solution 3 mL  3 mL Nebulization Q4H PRN Orson Eva J, DO      . ketotifen (ZADITOR) 0.025 % ophthalmic solution 1 drop  1 drop Left Eye BID Orson Eva J, DO   1 drop at 12/15/16 0833  . levETIRAcetam (KEPPRA) tablet 500 mg  500 mg Oral BID Orson Eva J, DO   500 mg at 12/15/16 0630  . ondansetron (ZOFRAN) tablet 4 mg  4 mg Oral Q6H PRN Orson Eva J, DO       Or  . ondansetron (ZOFRAN) injection 4 mg  4 mg Intravenous Q6H PRN Orson Eva J, DO      . oxyCODONE (Oxy IR/ROXICODONE) immediate release tablet 5 mg  5 mg Oral Q4H PRN Orson Eva J, DO      . polyethylene glycol (MIRALAX / GLYCOLAX) packet 17 g  17 g Oral Daily PRN Bufford Lope, DO      . polyvinyl alcohol (LIQUIFILM TEARS) 1.4 % ophthalmic solution 1 drop  1 drop Both Eyes PRN Orson Eva J, DO      . potassium chloride (K-DUR,KLOR-CON) CR tablet 10 mEq  10 mEq Oral Daily Orson Eva J, DO   10 mEq at 12/15/16 1601  . sertraline (ZOLOFT) tablet 50 mg  50 mg Oral Daily  Orson Eva J, DO   50 mg at 12/15/16 0834  . spironolactone (ALDACTONE) tablet 12.5 mg  12.5 mg Oral Daily Orson Eva J, DO   12.5 mg at 12/15/16 0932     Discharge Medications: Please see discharge summary for a list of discharge medications.  Relevant Imaging Results:  Relevant Lab Results:   Additional Information SSN: [id number]  Truitt Merle, LCSW

## 2016-12-15 NOTE — Progress Notes (Signed)
Orthopedic Tech Progress Note Patient Details:  Caroline Myers 1923-10-12 [id number]  Ortho Devices Type of Ortho Device: Arm sling Ortho Device/Splint Location: rue. replacement Ortho Device/Splint Interventions: Ordered, Application, Adjustment   Karolee Stamps 12/15/2016, 7:19 AM

## 2016-12-15 NOTE — Clinical Social Work Note (Signed)
Clinical Social Work Assessment  Patient Details  Name: Caroline Myers MRN: [id number] Date of Birth: Aug 18, 1923  Date of referral:  12/15/16               Reason for consult:  Discharge Planning                Permission sought to share information with:  Facility Sport and exercise psychologist, Family Supports Permission granted to share information::     Name::     Caroline Myers; Caroline Myers  Agency::  [facility]  Relationship::  Daughters  Contact Information:  [phone]; [phone]  Housing/Transportation Living arrangements for the past 2 months:  [name] of Information:  Adult Children Patient Interpreter Needed:  None Criminal Activity/Legal Involvement Pertinent to Current Situation/Hospitalization:  No - Comment as needed Significant Relationships:  Adult Children Lives with:  Facility Resident Do you feel safe going back to the place where you live?  Yes Need for family participation in patient care:  Yes (Comment)  Care giving concerns:  No care giving concerns identified.    Social Worker assessment / plan:  CSW consulted for pt return to [name]. CSW confirmed with [name]) that pt is a Hospice pt. Pt is ready for discharge today. CSW confirmed with dtrs- Caroline Myers and [name] via phone of pt's discharge plan to return to [facility]. Dtrs are unable to transport pt and asked that we arrange medical transport. CSW confirmed with Caroline that  medical transport would be [name]. CSW confirmed with [name] at [name] of pt's return and sent clinicals through the [name]. Report added to treatment team sticky note and RN aware. Transportation arranged with [name] is signing off as no further Social Work needs identified.  Employment status:  Retired Forensic scientist:  Other (Comment Required) ([facility]) PT Recommendations:  Not assessed at this time Information / Referral to community resources:  [name] (Dtrs to research higher LOC for patient)  Patient/Family's Response to care:  Pt A &Ox2 with dementia. Dtrs appreciative of CSW support and guidance.   Patient/Family's Understanding of and Emotional Response to Diagnosis, Current Treatment, and Prognosis:  Unable to assess pt understanding. Dtrs verbalize understanding of pt;s diagnosis, prognosis, and current treatment.   Emotional Assessment Appearance:  Appears stated age Attitude/Demeanor/Rapport:  Other (Appropriate) Affect (typically observed):  Other (Confused-Dementia) Orientation:  Oriented to Self, Oriented to Place Alcohol / Substance use:  Other Psych involvement (Current and /or in the community):  No (Comment)  Discharge Needs  Concerns to be addressed:  Discharge Planning Concerns Readmission within the last 30 days:  No Current discharge risk:  Dependent with Mobility, Cognitively Impaired Barriers to Discharge:  Continued Medical Work up   [name], LCSW 12/15/2016, 4:09 PM

## 2016-12-15 NOTE — Progress Notes (Signed)
Patients daughter called because her nose has started bleeding again at home. Paged provider and called daughter back. Hold pressure to nose, if bleeding continues for longer than 20 minutes she will need to come back to emergency department. Daughter verbalized understanding.

## 2016-12-15 NOTE — Progress Notes (Signed)
Patient transported via [facility]   Caroline Myers 12/15/2016 4:06 PM

## 2016-12-15 NOTE — Discharge Summary (Signed)
[facility] Discharge Summary  Patient name: Caroline Myers Medical record number: [id number] Date of birth: Mar 07, 1924 Age: 83 y.o. Gender: female Date of Admission: 12/13/2016  Date of Discharge: 12/15/16 Admitting Physician: Baird Kay, MD  Primary Care Provider: [facility] Consultants: ENT  Indication for Hospitalization: anterior epistaxis requiring balloon packing  Discharge Diagnoses/Problem List:  Patient Active Problem List   Diagnosis Date Noted  . Right-sided epistaxis 12/13/2016  . Intracranial hemorrhage ([place]) 12/09/2016  . Hospice care patient 12/09/2016  . Multiple contusions 12/09/2016  . Nasal fracture 12/09/2016  . AC separation, right, initial encounter 12/09/2016  . Fall at home, initial encounter 12/09/2016  . Chronic combined systolic and diastolic CHF (congestive heart failure) ([place]) 09/19/2016  . Chest pain 08/29/2016  . Concussion 07/06/2016  . CHF exacerbation ([place]) 06/28/2016  . Respiratory failure with hypoxia ([place]) 06/28/2016  . URI (upper respiratory infection) 06/28/2016  . Chest pressure 06/28/2016  . Oral dyskinesia 06/28/2016  . Falls 05/20/2016  . Pulmonary hypertension ([place]) 03/15/2016  . Anxiety about health   . Cough   . Hypokalemia   . AKI (acute kidney injury) ([place])   . Debility 07/20/2015  . Sepsis ([place]) 07/20/2015  . History of CVA (cerebrovascular accident)   . Tachypnea   . Leukocytosis 07/17/2015  . Arterial hypotension   . Hypotension 07/16/2015  . Elevated troponin 07/16/2015  . PNA (pneumonia)   . Severe sepsis ([place]) 07/15/2015  . Acute respiratory failure with hypercapnia ([place]) 07/15/2015  . Chronic anticoagulation 04/18/2015  . Orthostatic hypotension 03/09/2015  . CVA (cerebral infarction) 02/14/2015  . Stroke ([place]) 02/14/2015  . Stroke with cerebral ischemia ([place])   . Cerebral infarction due to embolism of left middle cerebral artery ([place])   .  Occipital fracture ([place])   . Benign essential HTN   . Other allergic rhinitis   . Depression   . SAH (subarachnoid hemorrhage) ([place]) 01/02/2015  . SDH (subdural hematoma) ([place]) 01/02/2015  . Aortic atherosclerosis ([place]) 08/05/2014  . Nonspecific abnormal unspecified cardiovascular function study 10/28/2013  . Severe aortic stenosis 10/28/2013  . HLD (hyperlipidemia) 07/28/2013  . TIA (transient ischemic attack) 07/27/2013  . History of CHF (congestive heart failure) 07/27/2013  . Urticaria 11/06/2010  . Bronchitis 10/12/2010  . Preventative health care   . Anxiety state 03/29/2010  . RIB PAIN, LEFT SIDED 10/12/2009  . ALLERGIC RHINITIS 06/28/2009  . SHOULDER PAIN, LEFT 06/28/2009  . SCIATICA, RIGHT 06/28/2009  . HEADACHE 06/28/2009  . DYSPNEA ON EXERTION 06/28/2009  . SKIN CANCER, HX OF 06/28/2009     Disposition: back to ALF [facility])  Discharge Condition: stable  Discharge Exam:  General: Sleeping peacefully with daughter at bedside HEENT: Extensive bruising on R side of face especially around R eye and R nare with dried blood, balloon packing in place in R nostril along with extra gauze on the right nostril with some dried blood.    Cardiovascular: RRR, no murmurs Respiratory: CTAB, normal effort on RA. No wheezes or rhonchi Gastrointestinal: soft, nontender, nondistended, + bowel sounds Derm: areas of ecchymoses documented as above, otherwise warm and dry  Brief Hospital Course:  Patient presented to ED from ALF twice on day of admission for epistaxis. She initially presented in AM on day of admission due to epistaxis that began when she stood up after waking up that morning. Staff at ALF did not apply pressure to nose and instead brought her directly to ED. This episode resolved with pressure,  and patient was sent back to ALF with instructions for staff in event of additional episodes. Patient developed another episode of epistaxis later that day that did not  improve with 20 minutes of pressure, so she was brought to ED again.  In ED, she required balloon packing to control bleeding. She had also started coughing up clots, and was requiring suctioning. It was thought that her epistaxis was secondary to Eliquis as well as nasal fracture sustained during fall earlier in the week. ENT was consulted, who recommended observation as well as holding home Eliquis, so patient was subsequently admitted.  Patient had one additional episode of bleeding on evening of admission that improved with further inflation of balloon. She has not had any additional bleeding for >36 hours. ENT saw patient again yesterday and recommended 5 days total of packing, with her daughter instructed to call her regular ENT's office (Dr. Lucia Gaskins) tomorrow morning to schedule a hospital f/u appointment. As patient had stabilized and ENT had cleared for discharge, patient was deemed appropriate for return to ALF.   Issues for Follow Up:  1. Patient to have packing in place for 5 days total (to be removed on 06/19).  2. Eliquis held during admission and not resumed upon discharge. Instructed to hold until told otherwise by ENT.  3. Patient's daughter Izora Gala instructed to call Dr. Pollie Friar office first thing tomorrow morning to schedule hospital f/u appointment. Izora Gala voiced understanding.  4. Patient taking Keppra for 7 days after fall with subsequent subdural hematoma. This was continued during admission. Last day is 06/19.   Significant Procedures: None  Significant Labs and Imaging:   Recent Labs Lab 12/09/16 1059 12/09/16 1111 12/13/16 1201 12/14/16 0619  WBC 9.7  --  7.0 9.5  HGB 13.9 14.3 12.5 12.3  HCT 43.7 42.0 39.8 39.7  PLT 212  --  213 226    Recent Labs Lab 12/09/16 1111 12/13/16 1201  NA 139 138  K 4.0 4.2  CL 102 104  CO2  --  29  GLUCOSE 95 91  BUN 20 31*  CREATININE 0.70 0.81  CALCIUM  --  8.8*  ALKPHOS  --  79  AST  --  19  ALT  --  11*  ALBUMIN  --   3.0*    Results/Tests Pending at Time of Discharge: None  Discharge Medications:  Allergies as of 12/15/2016      Reactions   Tape Other (See Comments)   Rips skin-paper tape   Verapamil Other (See Comments)   Per MAR      Medication List    STOP taking these medications   apixaban 5 MG Tabs tablet Commonly known as:  ELIQUIS     TAKE these medications   acetaminophen 325 MG tablet Commonly known as:  TYLENOL Take 650 mg by mouth every 4 (four) hours as needed for mild pain or fever. What changed:  Another medication with the same name was removed. Continue taking this medication, and follow the directions you see here.   acetaminophen 650 MG suppository Commonly known as:  TYLENOL Place 650 mg rectally every 4 (four) hours as needed for mild pain or fever. What changed:  Another medication with the same name was removed. Continue taking this medication, and follow the directions you see here.   ALPRAZolam 0.25 MG tablet Commonly known as:  XANAX Take 0.25 mg by mouth 2 (two) times daily. What changed:  Another medication with the same name was removed. Continue taking this  medication, and follow the directions you see here.   ARTIFICIAL TEAR OP Place 1 drop into the right eye 4 (four) times daily.   azelastine 0.05 % ophthalmic solution Commonly known as:  OPTIVAR Place 1 drop into the left eye 2 (two) times daily.   furosemide 20 MG tablet Commonly known as:  LASIX Take 0.5 tablets (10 mg total) by mouth daily.   ipratropium-albuterol 0.5-2.5 (3) MG/3ML Soln Commonly known as:  DUONEB Take 3 mLs by nebulization 3 (three) times daily. What changed:  Another medication with the same name was removed. Continue taking this medication, and follow the directions you see here.   levETIRAcetam 500 MG tablet Commonly known as:  KEPPRA Take 1 tablet (500 mg total) by mouth 2 (two) times daily.   oxymetazoline 0.05 % nasal spray Commonly known as:  AFRIN Place 2  sprays into both nostrils 2 (two) times daily as needed for congestion.   potassium chloride 10 MEQ tablet Commonly known as:  K-DUR Take 1 tablet (10 mEq total) by mouth daily.   PROAIR HFA 108 (90 Base) MCG/ACT inhaler Generic drug:  albuterol Inhale 2 puffs into the lungs every 4 (four) hours as needed for wheezing or shortness of breath.   sertraline 50 MG tablet Commonly known as:  ZOLOFT Take 1 tablet (50 mg total) by mouth daily.   spironolactone 25 MG tablet Commonly known as:  ALDACTONE Take 12.5 mg by mouth daily. Taken on Monday Wednesday and Friday       Discharge Instructions: Please refer to Patient Instructions section of EMR for full details.  Patient was counseled important signs and symptoms that should prompt return to medical care, changes in medications, dietary instructions, activity restrictions, and follow up appointments.   Follow-Up Appointments: Follow-up Information    [facility]. Schedule an appointment as soon as possible for a visit.   Specialty:  Geriatric Medicine Why:  For hospital follow-up within one week of discharge Contact information: [place] [address] [phone]        Rozetta Nunnery, MD. Call on 12/16/2016.   Specialty:  Otolaryngology Why:  Please call first thing Monday morning to schedule a hospital follow-up appointment.  Contact information: [place] [address] [phone]           Verner Mould, MD 12/15/2016, 11:43 AM PGY-2, [place]

## 2016-12-17 DIAGNOSIS — R040 Epistaxis: Secondary | ICD-10-CM | POA: Diagnosis not present

## 2016-12-18 DIAGNOSIS — H1032 Unspecified acute conjunctivitis, left eye: Secondary | ICD-10-CM | POA: Diagnosis not present

## 2016-12-18 DIAGNOSIS — F419 Anxiety disorder, unspecified: Secondary | ICD-10-CM | POA: Diagnosis not present

## 2016-12-18 DIAGNOSIS — I4891 Unspecified atrial fibrillation: Secondary | ICD-10-CM | POA: Diagnosis not present

## 2016-12-18 DIAGNOSIS — K219 Gastro-esophageal reflux disease without esophagitis: Secondary | ICD-10-CM | POA: Diagnosis not present

## 2016-12-18 DIAGNOSIS — I509 Heart failure, unspecified: Secondary | ICD-10-CM | POA: Diagnosis not present

## 2016-12-18 DIAGNOSIS — J449 Chronic obstructive pulmonary disease, unspecified: Secondary | ICD-10-CM | POA: Diagnosis not present

## 2016-12-18 DIAGNOSIS — R269 Unspecified abnormalities of gait and mobility: Secondary | ICD-10-CM | POA: Diagnosis not present

## 2016-12-18 DIAGNOSIS — J309 Allergic rhinitis, unspecified: Secondary | ICD-10-CM | POA: Diagnosis not present

## 2016-12-26 DIAGNOSIS — B351 Tinea unguium: Secondary | ICD-10-CM | POA: Diagnosis not present

## 2016-12-26 DIAGNOSIS — I739 Peripheral vascular disease, unspecified: Secondary | ICD-10-CM | POA: Diagnosis not present

## 2016-12-26 DIAGNOSIS — L84 Corns and callosities: Secondary | ICD-10-CM | POA: Diagnosis not present

## 2016-12-26 DIAGNOSIS — L605 Yellow nail syndrome: Secondary | ICD-10-CM | POA: Diagnosis not present

## 2016-12-26 DIAGNOSIS — M2040 Other hammer toe(s) (acquired), unspecified foot: Secondary | ICD-10-CM | POA: Diagnosis not present

## 2016-12-29 DIAGNOSIS — R54 Age-related physical debility: Secondary | ICD-10-CM | POA: Diagnosis not present

## 2016-12-29 DIAGNOSIS — J449 Chronic obstructive pulmonary disease, unspecified: Secondary | ICD-10-CM | POA: Diagnosis not present

## 2016-12-29 DIAGNOSIS — I519 Heart disease, unspecified: Secondary | ICD-10-CM | POA: Diagnosis not present

## 2016-12-29 DIAGNOSIS — I509 Heart failure, unspecified: Secondary | ICD-10-CM | POA: Diagnosis not present

## 2016-12-29 DIAGNOSIS — F411 Generalized anxiety disorder: Secondary | ICD-10-CM | POA: Diagnosis not present

## 2016-12-29 DIAGNOSIS — I350 Nonrheumatic aortic (valve) stenosis: Secondary | ICD-10-CM | POA: Diagnosis not present

## 2016-12-29 DIAGNOSIS — I10 Essential (primary) hypertension: Secondary | ICD-10-CM | POA: Diagnosis not present

## 2016-12-29 DIAGNOSIS — S065X0S Traumatic subdural hemorrhage without loss of consciousness, sequela: Secondary | ICD-10-CM | POA: Diagnosis not present

## 2016-12-29 DIAGNOSIS — J309 Allergic rhinitis, unspecified: Secondary | ICD-10-CM | POA: Diagnosis not present

## 2016-12-29 DIAGNOSIS — I679 Cerebrovascular disease, unspecified: Secondary | ICD-10-CM | POA: Diagnosis not present

## 2016-12-29 DIAGNOSIS — I4891 Unspecified atrial fibrillation: Secondary | ICD-10-CM | POA: Diagnosis not present

## 2016-12-31 DIAGNOSIS — I519 Heart disease, unspecified: Secondary | ICD-10-CM | POA: Diagnosis not present

## 2016-12-31 DIAGNOSIS — I350 Nonrheumatic aortic (valve) stenosis: Secondary | ICD-10-CM | POA: Diagnosis not present

## 2016-12-31 DIAGNOSIS — I4891 Unspecified atrial fibrillation: Secondary | ICD-10-CM | POA: Diagnosis not present

## 2016-12-31 DIAGNOSIS — J449 Chronic obstructive pulmonary disease, unspecified: Secondary | ICD-10-CM | POA: Diagnosis not present

## 2016-12-31 DIAGNOSIS — I509 Heart failure, unspecified: Secondary | ICD-10-CM | POA: Diagnosis not present

## 2016-12-31 DIAGNOSIS — I679 Cerebrovascular disease, unspecified: Secondary | ICD-10-CM | POA: Diagnosis not present

## 2017-01-01 DIAGNOSIS — I509 Heart failure, unspecified: Secondary | ICD-10-CM | POA: Diagnosis not present

## 2017-01-01 DIAGNOSIS — I519 Heart disease, unspecified: Secondary | ICD-10-CM | POA: Diagnosis not present

## 2017-01-01 DIAGNOSIS — I350 Nonrheumatic aortic (valve) stenosis: Secondary | ICD-10-CM | POA: Diagnosis not present

## 2017-01-01 DIAGNOSIS — I679 Cerebrovascular disease, unspecified: Secondary | ICD-10-CM | POA: Diagnosis not present

## 2017-01-01 DIAGNOSIS — J449 Chronic obstructive pulmonary disease, unspecified: Secondary | ICD-10-CM | POA: Diagnosis not present

## 2017-01-01 DIAGNOSIS — I4891 Unspecified atrial fibrillation: Secondary | ICD-10-CM | POA: Diagnosis not present

## 2017-01-02 DIAGNOSIS — I4891 Unspecified atrial fibrillation: Secondary | ICD-10-CM | POA: Diagnosis not present

## 2017-01-02 DIAGNOSIS — I519 Heart disease, unspecified: Secondary | ICD-10-CM | POA: Diagnosis not present

## 2017-01-02 DIAGNOSIS — I509 Heart failure, unspecified: Secondary | ICD-10-CM | POA: Diagnosis not present

## 2017-01-02 DIAGNOSIS — J449 Chronic obstructive pulmonary disease, unspecified: Secondary | ICD-10-CM | POA: Diagnosis not present

## 2017-01-02 DIAGNOSIS — I350 Nonrheumatic aortic (valve) stenosis: Secondary | ICD-10-CM | POA: Diagnosis not present

## 2017-01-02 DIAGNOSIS — I679 Cerebrovascular disease, unspecified: Secondary | ICD-10-CM | POA: Diagnosis not present

## 2017-01-03 DIAGNOSIS — I350 Nonrheumatic aortic (valve) stenosis: Secondary | ICD-10-CM | POA: Diagnosis not present

## 2017-01-03 DIAGNOSIS — I4891 Unspecified atrial fibrillation: Secondary | ICD-10-CM | POA: Diagnosis not present

## 2017-01-03 DIAGNOSIS — I519 Heart disease, unspecified: Secondary | ICD-10-CM | POA: Diagnosis not present

## 2017-01-03 DIAGNOSIS — I509 Heart failure, unspecified: Secondary | ICD-10-CM | POA: Diagnosis not present

## 2017-01-03 DIAGNOSIS — J449 Chronic obstructive pulmonary disease, unspecified: Secondary | ICD-10-CM | POA: Diagnosis not present

## 2017-01-03 DIAGNOSIS — I679 Cerebrovascular disease, unspecified: Secondary | ICD-10-CM | POA: Diagnosis not present

## 2017-01-05 DIAGNOSIS — I4891 Unspecified atrial fibrillation: Secondary | ICD-10-CM | POA: Diagnosis not present

## 2017-01-05 DIAGNOSIS — I679 Cerebrovascular disease, unspecified: Secondary | ICD-10-CM | POA: Diagnosis not present

## 2017-01-05 DIAGNOSIS — J449 Chronic obstructive pulmonary disease, unspecified: Secondary | ICD-10-CM | POA: Diagnosis not present

## 2017-01-05 DIAGNOSIS — I509 Heart failure, unspecified: Secondary | ICD-10-CM | POA: Diagnosis not present

## 2017-01-05 DIAGNOSIS — I350 Nonrheumatic aortic (valve) stenosis: Secondary | ICD-10-CM | POA: Diagnosis not present

## 2017-01-05 DIAGNOSIS — I519 Heart disease, unspecified: Secondary | ICD-10-CM | POA: Diagnosis not present

## 2017-01-07 DIAGNOSIS — I519 Heart disease, unspecified: Secondary | ICD-10-CM | POA: Diagnosis not present

## 2017-01-07 DIAGNOSIS — J449 Chronic obstructive pulmonary disease, unspecified: Secondary | ICD-10-CM | POA: Diagnosis not present

## 2017-01-07 DIAGNOSIS — I350 Nonrheumatic aortic (valve) stenosis: Secondary | ICD-10-CM | POA: Diagnosis not present

## 2017-01-07 DIAGNOSIS — I4891 Unspecified atrial fibrillation: Secondary | ICD-10-CM | POA: Diagnosis not present

## 2017-01-07 DIAGNOSIS — I509 Heart failure, unspecified: Secondary | ICD-10-CM | POA: Diagnosis not present

## 2017-01-07 DIAGNOSIS — I679 Cerebrovascular disease, unspecified: Secondary | ICD-10-CM | POA: Diagnosis not present

## 2017-01-08 DIAGNOSIS — I679 Cerebrovascular disease, unspecified: Secondary | ICD-10-CM | POA: Diagnosis not present

## 2017-01-08 DIAGNOSIS — I4891 Unspecified atrial fibrillation: Secondary | ICD-10-CM | POA: Diagnosis not present

## 2017-01-08 DIAGNOSIS — I519 Heart disease, unspecified: Secondary | ICD-10-CM | POA: Diagnosis not present

## 2017-01-08 DIAGNOSIS — I509 Heart failure, unspecified: Secondary | ICD-10-CM | POA: Diagnosis not present

## 2017-01-08 DIAGNOSIS — J449 Chronic obstructive pulmonary disease, unspecified: Secondary | ICD-10-CM | POA: Diagnosis not present

## 2017-01-08 DIAGNOSIS — I350 Nonrheumatic aortic (valve) stenosis: Secondary | ICD-10-CM | POA: Diagnosis not present

## 2017-01-10 DIAGNOSIS — J449 Chronic obstructive pulmonary disease, unspecified: Secondary | ICD-10-CM | POA: Diagnosis not present

## 2017-01-10 DIAGNOSIS — I4891 Unspecified atrial fibrillation: Secondary | ICD-10-CM | POA: Diagnosis not present

## 2017-01-10 DIAGNOSIS — I350 Nonrheumatic aortic (valve) stenosis: Secondary | ICD-10-CM | POA: Diagnosis not present

## 2017-01-10 DIAGNOSIS — I519 Heart disease, unspecified: Secondary | ICD-10-CM | POA: Diagnosis not present

## 2017-01-10 DIAGNOSIS — I509 Heart failure, unspecified: Secondary | ICD-10-CM | POA: Diagnosis not present

## 2017-01-10 DIAGNOSIS — I679 Cerebrovascular disease, unspecified: Secondary | ICD-10-CM | POA: Diagnosis not present

## 2017-01-14 DIAGNOSIS — J449 Chronic obstructive pulmonary disease, unspecified: Secondary | ICD-10-CM | POA: Diagnosis not present

## 2017-01-14 DIAGNOSIS — I350 Nonrheumatic aortic (valve) stenosis: Secondary | ICD-10-CM | POA: Diagnosis not present

## 2017-01-14 DIAGNOSIS — I679 Cerebrovascular disease, unspecified: Secondary | ICD-10-CM | POA: Diagnosis not present

## 2017-01-14 DIAGNOSIS — I4891 Unspecified atrial fibrillation: Secondary | ICD-10-CM | POA: Diagnosis not present

## 2017-01-14 DIAGNOSIS — I519 Heart disease, unspecified: Secondary | ICD-10-CM | POA: Diagnosis not present

## 2017-01-14 DIAGNOSIS — I509 Heart failure, unspecified: Secondary | ICD-10-CM | POA: Diagnosis not present

## 2017-01-15 DIAGNOSIS — I4891 Unspecified atrial fibrillation: Secondary | ICD-10-CM | POA: Diagnosis not present

## 2017-01-15 DIAGNOSIS — R635 Abnormal weight gain: Secondary | ICD-10-CM | POA: Diagnosis not present

## 2017-01-15 DIAGNOSIS — K219 Gastro-esophageal reflux disease without esophagitis: Secondary | ICD-10-CM | POA: Diagnosis not present

## 2017-01-15 DIAGNOSIS — M25511 Pain in right shoulder: Secondary | ICD-10-CM | POA: Diagnosis not present

## 2017-01-15 DIAGNOSIS — R269 Unspecified abnormalities of gait and mobility: Secondary | ICD-10-CM | POA: Diagnosis not present

## 2017-01-15 DIAGNOSIS — J309 Allergic rhinitis, unspecified: Secondary | ICD-10-CM | POA: Diagnosis not present

## 2017-01-15 DIAGNOSIS — J449 Chronic obstructive pulmonary disease, unspecified: Secondary | ICD-10-CM | POA: Diagnosis not present

## 2017-01-15 DIAGNOSIS — I509 Heart failure, unspecified: Secondary | ICD-10-CM | POA: Diagnosis not present

## 2017-01-17 DIAGNOSIS — I350 Nonrheumatic aortic (valve) stenosis: Secondary | ICD-10-CM | POA: Diagnosis not present

## 2017-01-17 DIAGNOSIS — I519 Heart disease, unspecified: Secondary | ICD-10-CM | POA: Diagnosis not present

## 2017-01-17 DIAGNOSIS — J449 Chronic obstructive pulmonary disease, unspecified: Secondary | ICD-10-CM | POA: Diagnosis not present

## 2017-01-17 DIAGNOSIS — I509 Heart failure, unspecified: Secondary | ICD-10-CM | POA: Diagnosis not present

## 2017-01-17 DIAGNOSIS — I4891 Unspecified atrial fibrillation: Secondary | ICD-10-CM | POA: Diagnosis not present

## 2017-01-17 DIAGNOSIS — I679 Cerebrovascular disease, unspecified: Secondary | ICD-10-CM | POA: Diagnosis not present

## 2017-01-20 DIAGNOSIS — M25511 Pain in right shoulder: Secondary | ICD-10-CM | POA: Diagnosis not present

## 2017-01-20 DIAGNOSIS — I519 Heart disease, unspecified: Secondary | ICD-10-CM | POA: Diagnosis not present

## 2017-01-20 DIAGNOSIS — I350 Nonrheumatic aortic (valve) stenosis: Secondary | ICD-10-CM | POA: Diagnosis not present

## 2017-01-20 DIAGNOSIS — I679 Cerebrovascular disease, unspecified: Secondary | ICD-10-CM | POA: Diagnosis not present

## 2017-01-20 DIAGNOSIS — I509 Heart failure, unspecified: Secondary | ICD-10-CM | POA: Diagnosis not present

## 2017-01-20 DIAGNOSIS — J449 Chronic obstructive pulmonary disease, unspecified: Secondary | ICD-10-CM | POA: Diagnosis not present

## 2017-01-20 DIAGNOSIS — I4891 Unspecified atrial fibrillation: Secondary | ICD-10-CM | POA: Diagnosis not present

## 2017-01-21 DIAGNOSIS — J449 Chronic obstructive pulmonary disease, unspecified: Secondary | ICD-10-CM | POA: Diagnosis not present

## 2017-01-21 DIAGNOSIS — I679 Cerebrovascular disease, unspecified: Secondary | ICD-10-CM | POA: Diagnosis not present

## 2017-01-21 DIAGNOSIS — I509 Heart failure, unspecified: Secondary | ICD-10-CM | POA: Diagnosis not present

## 2017-01-21 DIAGNOSIS — I519 Heart disease, unspecified: Secondary | ICD-10-CM | POA: Diagnosis not present

## 2017-01-21 DIAGNOSIS — I4891 Unspecified atrial fibrillation: Secondary | ICD-10-CM | POA: Diagnosis not present

## 2017-01-21 DIAGNOSIS — I350 Nonrheumatic aortic (valve) stenosis: Secondary | ICD-10-CM | POA: Diagnosis not present

## 2017-01-22 DIAGNOSIS — J309 Allergic rhinitis, unspecified: Secondary | ICD-10-CM | POA: Diagnosis not present

## 2017-01-22 DIAGNOSIS — I4891 Unspecified atrial fibrillation: Secondary | ICD-10-CM | POA: Diagnosis not present

## 2017-01-22 DIAGNOSIS — J449 Chronic obstructive pulmonary disease, unspecified: Secondary | ICD-10-CM | POA: Diagnosis not present

## 2017-01-22 DIAGNOSIS — K219 Gastro-esophageal reflux disease without esophagitis: Secondary | ICD-10-CM | POA: Diagnosis not present

## 2017-01-22 DIAGNOSIS — H1032 Unspecified acute conjunctivitis, left eye: Secondary | ICD-10-CM | POA: Diagnosis not present

## 2017-01-22 DIAGNOSIS — R269 Unspecified abnormalities of gait and mobility: Secondary | ICD-10-CM | POA: Diagnosis not present

## 2017-01-22 DIAGNOSIS — I509 Heart failure, unspecified: Secondary | ICD-10-CM | POA: Diagnosis not present

## 2017-01-22 DIAGNOSIS — I679 Cerebrovascular disease, unspecified: Secondary | ICD-10-CM | POA: Diagnosis not present

## 2017-01-22 DIAGNOSIS — I350 Nonrheumatic aortic (valve) stenosis: Secondary | ICD-10-CM | POA: Diagnosis not present

## 2017-01-22 DIAGNOSIS — M25511 Pain in right shoulder: Secondary | ICD-10-CM | POA: Diagnosis not present

## 2017-01-22 DIAGNOSIS — I519 Heart disease, unspecified: Secondary | ICD-10-CM | POA: Diagnosis not present

## 2017-01-23 DIAGNOSIS — H02102 Unspecified ectropion of right lower eyelid: Secondary | ICD-10-CM | POA: Diagnosis not present

## 2017-01-23 DIAGNOSIS — H02105 Unspecified ectropion of left lower eyelid: Secondary | ICD-10-CM | POA: Diagnosis not present

## 2017-01-24 DIAGNOSIS — I519 Heart disease, unspecified: Secondary | ICD-10-CM | POA: Diagnosis not present

## 2017-01-24 DIAGNOSIS — J449 Chronic obstructive pulmonary disease, unspecified: Secondary | ICD-10-CM | POA: Diagnosis not present

## 2017-01-24 DIAGNOSIS — I679 Cerebrovascular disease, unspecified: Secondary | ICD-10-CM | POA: Diagnosis not present

## 2017-01-24 DIAGNOSIS — I509 Heart failure, unspecified: Secondary | ICD-10-CM | POA: Diagnosis not present

## 2017-01-24 DIAGNOSIS — I4891 Unspecified atrial fibrillation: Secondary | ICD-10-CM | POA: Diagnosis not present

## 2017-01-24 DIAGNOSIS — I350 Nonrheumatic aortic (valve) stenosis: Secondary | ICD-10-CM | POA: Diagnosis not present

## 2017-01-25 DIAGNOSIS — I679 Cerebrovascular disease, unspecified: Secondary | ICD-10-CM | POA: Diagnosis not present

## 2017-01-25 DIAGNOSIS — I350 Nonrheumatic aortic (valve) stenosis: Secondary | ICD-10-CM | POA: Diagnosis not present

## 2017-01-25 DIAGNOSIS — I4891 Unspecified atrial fibrillation: Secondary | ICD-10-CM | POA: Diagnosis not present

## 2017-01-25 DIAGNOSIS — I519 Heart disease, unspecified: Secondary | ICD-10-CM | POA: Diagnosis not present

## 2017-01-25 DIAGNOSIS — J449 Chronic obstructive pulmonary disease, unspecified: Secondary | ICD-10-CM | POA: Diagnosis not present

## 2017-01-25 DIAGNOSIS — I509 Heart failure, unspecified: Secondary | ICD-10-CM | POA: Diagnosis not present

## 2017-01-27 DIAGNOSIS — I350 Nonrheumatic aortic (valve) stenosis: Secondary | ICD-10-CM | POA: Diagnosis not present

## 2017-01-27 DIAGNOSIS — I4891 Unspecified atrial fibrillation: Secondary | ICD-10-CM | POA: Diagnosis not present

## 2017-01-27 DIAGNOSIS — I509 Heart failure, unspecified: Secondary | ICD-10-CM | POA: Diagnosis not present

## 2017-01-27 DIAGNOSIS — I519 Heart disease, unspecified: Secondary | ICD-10-CM | POA: Diagnosis not present

## 2017-01-27 DIAGNOSIS — I679 Cerebrovascular disease, unspecified: Secondary | ICD-10-CM | POA: Diagnosis not present

## 2017-01-27 DIAGNOSIS — J449 Chronic obstructive pulmonary disease, unspecified: Secondary | ICD-10-CM | POA: Diagnosis not present

## 2017-01-28 DIAGNOSIS — I509 Heart failure, unspecified: Secondary | ICD-10-CM | POA: Diagnosis not present

## 2017-01-28 DIAGNOSIS — I679 Cerebrovascular disease, unspecified: Secondary | ICD-10-CM | POA: Diagnosis not present

## 2017-01-28 DIAGNOSIS — I519 Heart disease, unspecified: Secondary | ICD-10-CM | POA: Diagnosis not present

## 2017-01-28 DIAGNOSIS — J449 Chronic obstructive pulmonary disease, unspecified: Secondary | ICD-10-CM | POA: Diagnosis not present

## 2017-01-28 DIAGNOSIS — I350 Nonrheumatic aortic (valve) stenosis: Secondary | ICD-10-CM | POA: Diagnosis not present

## 2017-01-28 DIAGNOSIS — I4891 Unspecified atrial fibrillation: Secondary | ICD-10-CM | POA: Diagnosis not present

## 2017-01-29 DIAGNOSIS — I679 Cerebrovascular disease, unspecified: Secondary | ICD-10-CM | POA: Diagnosis not present

## 2017-01-29 DIAGNOSIS — J449 Chronic obstructive pulmonary disease, unspecified: Secondary | ICD-10-CM | POA: Diagnosis not present

## 2017-01-29 DIAGNOSIS — F411 Generalized anxiety disorder: Secondary | ICD-10-CM | POA: Diagnosis not present

## 2017-01-29 DIAGNOSIS — I350 Nonrheumatic aortic (valve) stenosis: Secondary | ICD-10-CM | POA: Diagnosis not present

## 2017-01-29 DIAGNOSIS — S065X0S Traumatic subdural hemorrhage without loss of consciousness, sequela: Secondary | ICD-10-CM | POA: Diagnosis not present

## 2017-01-29 DIAGNOSIS — I519 Heart disease, unspecified: Secondary | ICD-10-CM | POA: Diagnosis not present

## 2017-01-29 DIAGNOSIS — I4891 Unspecified atrial fibrillation: Secondary | ICD-10-CM | POA: Diagnosis not present

## 2017-01-29 DIAGNOSIS — R54 Age-related physical debility: Secondary | ICD-10-CM | POA: Diagnosis not present

## 2017-01-29 DIAGNOSIS — I10 Essential (primary) hypertension: Secondary | ICD-10-CM | POA: Diagnosis not present

## 2017-01-29 DIAGNOSIS — J309 Allergic rhinitis, unspecified: Secondary | ICD-10-CM | POA: Diagnosis not present

## 2017-01-29 DIAGNOSIS — I509 Heart failure, unspecified: Secondary | ICD-10-CM | POA: Diagnosis not present

## 2017-01-30 DIAGNOSIS — I509 Heart failure, unspecified: Secondary | ICD-10-CM | POA: Diagnosis not present

## 2017-01-30 DIAGNOSIS — J449 Chronic obstructive pulmonary disease, unspecified: Secondary | ICD-10-CM | POA: Diagnosis not present

## 2017-01-30 DIAGNOSIS — I350 Nonrheumatic aortic (valve) stenosis: Secondary | ICD-10-CM | POA: Diagnosis not present

## 2017-01-30 DIAGNOSIS — I679 Cerebrovascular disease, unspecified: Secondary | ICD-10-CM | POA: Diagnosis not present

## 2017-01-30 DIAGNOSIS — I4891 Unspecified atrial fibrillation: Secondary | ICD-10-CM | POA: Diagnosis not present

## 2017-01-30 DIAGNOSIS — I519 Heart disease, unspecified: Secondary | ICD-10-CM | POA: Diagnosis not present

## 2017-01-31 DIAGNOSIS — I350 Nonrheumatic aortic (valve) stenosis: Secondary | ICD-10-CM | POA: Diagnosis not present

## 2017-01-31 DIAGNOSIS — I509 Heart failure, unspecified: Secondary | ICD-10-CM | POA: Diagnosis not present

## 2017-01-31 DIAGNOSIS — I519 Heart disease, unspecified: Secondary | ICD-10-CM | POA: Diagnosis not present

## 2017-01-31 DIAGNOSIS — J449 Chronic obstructive pulmonary disease, unspecified: Secondary | ICD-10-CM | POA: Diagnosis not present

## 2017-01-31 DIAGNOSIS — I679 Cerebrovascular disease, unspecified: Secondary | ICD-10-CM | POA: Diagnosis not present

## 2017-01-31 DIAGNOSIS — I4891 Unspecified atrial fibrillation: Secondary | ICD-10-CM | POA: Diagnosis not present

## 2017-02-01 DIAGNOSIS — I679 Cerebrovascular disease, unspecified: Secondary | ICD-10-CM | POA: Diagnosis not present

## 2017-02-01 DIAGNOSIS — I350 Nonrheumatic aortic (valve) stenosis: Secondary | ICD-10-CM | POA: Diagnosis not present

## 2017-02-01 DIAGNOSIS — J449 Chronic obstructive pulmonary disease, unspecified: Secondary | ICD-10-CM | POA: Diagnosis not present

## 2017-02-01 DIAGNOSIS — I4891 Unspecified atrial fibrillation: Secondary | ICD-10-CM | POA: Diagnosis not present

## 2017-02-01 DIAGNOSIS — I509 Heart failure, unspecified: Secondary | ICD-10-CM | POA: Diagnosis not present

## 2017-02-01 DIAGNOSIS — I519 Heart disease, unspecified: Secondary | ICD-10-CM | POA: Diagnosis not present

## 2017-02-03 DIAGNOSIS — I110 Hypertensive heart disease with heart failure: Secondary | ICD-10-CM | POA: Diagnosis not present

## 2017-02-03 DIAGNOSIS — I519 Heart disease, unspecified: Secondary | ICD-10-CM | POA: Diagnosis not present

## 2017-02-03 DIAGNOSIS — I350 Nonrheumatic aortic (valve) stenosis: Secondary | ICD-10-CM | POA: Diagnosis not present

## 2017-02-03 DIAGNOSIS — I951 Orthostatic hypotension: Secondary | ICD-10-CM | POA: Diagnosis not present

## 2017-02-03 DIAGNOSIS — F419 Anxiety disorder, unspecified: Secondary | ICD-10-CM | POA: Diagnosis not present

## 2017-02-03 DIAGNOSIS — I4891 Unspecified atrial fibrillation: Secondary | ICD-10-CM | POA: Diagnosis not present

## 2017-02-03 DIAGNOSIS — I679 Cerebrovascular disease, unspecified: Secondary | ICD-10-CM | POA: Diagnosis not present

## 2017-02-03 DIAGNOSIS — J449 Chronic obstructive pulmonary disease, unspecified: Secondary | ICD-10-CM | POA: Diagnosis not present

## 2017-02-03 DIAGNOSIS — I509 Heart failure, unspecified: Secondary | ICD-10-CM | POA: Diagnosis not present

## 2017-02-04 DIAGNOSIS — I4891 Unspecified atrial fibrillation: Secondary | ICD-10-CM | POA: Diagnosis not present

## 2017-02-04 DIAGNOSIS — I350 Nonrheumatic aortic (valve) stenosis: Secondary | ICD-10-CM | POA: Diagnosis not present

## 2017-02-04 DIAGNOSIS — I519 Heart disease, unspecified: Secondary | ICD-10-CM | POA: Diagnosis not present

## 2017-02-04 DIAGNOSIS — I509 Heart failure, unspecified: Secondary | ICD-10-CM | POA: Diagnosis not present

## 2017-02-04 DIAGNOSIS — I679 Cerebrovascular disease, unspecified: Secondary | ICD-10-CM | POA: Diagnosis not present

## 2017-02-04 DIAGNOSIS — J449 Chronic obstructive pulmonary disease, unspecified: Secondary | ICD-10-CM | POA: Diagnosis not present

## 2017-02-05 DIAGNOSIS — J449 Chronic obstructive pulmonary disease, unspecified: Secondary | ICD-10-CM | POA: Diagnosis not present

## 2017-02-05 DIAGNOSIS — I509 Heart failure, unspecified: Secondary | ICD-10-CM | POA: Diagnosis not present

## 2017-02-05 DIAGNOSIS — M25511 Pain in right shoulder: Secondary | ICD-10-CM | POA: Diagnosis not present

## 2017-02-05 DIAGNOSIS — I679 Cerebrovascular disease, unspecified: Secondary | ICD-10-CM | POA: Diagnosis not present

## 2017-02-05 DIAGNOSIS — I519 Heart disease, unspecified: Secondary | ICD-10-CM | POA: Diagnosis not present

## 2017-02-05 DIAGNOSIS — I4891 Unspecified atrial fibrillation: Secondary | ICD-10-CM | POA: Diagnosis not present

## 2017-02-05 DIAGNOSIS — I350 Nonrheumatic aortic (valve) stenosis: Secondary | ICD-10-CM | POA: Diagnosis not present

## 2017-02-06 DIAGNOSIS — I4891 Unspecified atrial fibrillation: Secondary | ICD-10-CM | POA: Diagnosis not present

## 2017-02-06 DIAGNOSIS — I350 Nonrheumatic aortic (valve) stenosis: Secondary | ICD-10-CM | POA: Diagnosis not present

## 2017-02-06 DIAGNOSIS — I519 Heart disease, unspecified: Secondary | ICD-10-CM | POA: Diagnosis not present

## 2017-02-06 DIAGNOSIS — I679 Cerebrovascular disease, unspecified: Secondary | ICD-10-CM | POA: Diagnosis not present

## 2017-02-06 DIAGNOSIS — I509 Heart failure, unspecified: Secondary | ICD-10-CM | POA: Diagnosis not present

## 2017-02-06 DIAGNOSIS — J449 Chronic obstructive pulmonary disease, unspecified: Secondary | ICD-10-CM | POA: Diagnosis not present

## 2017-02-07 DIAGNOSIS — I679 Cerebrovascular disease, unspecified: Secondary | ICD-10-CM | POA: Diagnosis not present

## 2017-02-07 DIAGNOSIS — I519 Heart disease, unspecified: Secondary | ICD-10-CM | POA: Diagnosis not present

## 2017-02-07 DIAGNOSIS — I509 Heart failure, unspecified: Secondary | ICD-10-CM | POA: Diagnosis not present

## 2017-02-07 DIAGNOSIS — J449 Chronic obstructive pulmonary disease, unspecified: Secondary | ICD-10-CM | POA: Diagnosis not present

## 2017-02-07 DIAGNOSIS — I350 Nonrheumatic aortic (valve) stenosis: Secondary | ICD-10-CM | POA: Diagnosis not present

## 2017-02-07 DIAGNOSIS — I4891 Unspecified atrial fibrillation: Secondary | ICD-10-CM | POA: Diagnosis not present

## 2017-02-10 DIAGNOSIS — I679 Cerebrovascular disease, unspecified: Secondary | ICD-10-CM | POA: Diagnosis not present

## 2017-02-10 DIAGNOSIS — I350 Nonrheumatic aortic (valve) stenosis: Secondary | ICD-10-CM | POA: Diagnosis not present

## 2017-02-10 DIAGNOSIS — I519 Heart disease, unspecified: Secondary | ICD-10-CM | POA: Diagnosis not present

## 2017-02-10 DIAGNOSIS — J449 Chronic obstructive pulmonary disease, unspecified: Secondary | ICD-10-CM | POA: Diagnosis not present

## 2017-02-10 DIAGNOSIS — I4891 Unspecified atrial fibrillation: Secondary | ICD-10-CM | POA: Diagnosis not present

## 2017-02-10 DIAGNOSIS — I509 Heart failure, unspecified: Secondary | ICD-10-CM | POA: Diagnosis not present

## 2017-02-11 DIAGNOSIS — Z79899 Other long term (current) drug therapy: Secondary | ICD-10-CM | POA: Diagnosis not present

## 2017-02-11 DIAGNOSIS — I679 Cerebrovascular disease, unspecified: Secondary | ICD-10-CM | POA: Diagnosis not present

## 2017-02-11 DIAGNOSIS — I519 Heart disease, unspecified: Secondary | ICD-10-CM | POA: Diagnosis not present

## 2017-02-11 DIAGNOSIS — I350 Nonrheumatic aortic (valve) stenosis: Secondary | ICD-10-CM | POA: Diagnosis not present

## 2017-02-11 DIAGNOSIS — J449 Chronic obstructive pulmonary disease, unspecified: Secondary | ICD-10-CM | POA: Diagnosis not present

## 2017-02-11 DIAGNOSIS — I509 Heart failure, unspecified: Secondary | ICD-10-CM | POA: Diagnosis not present

## 2017-02-11 DIAGNOSIS — I4891 Unspecified atrial fibrillation: Secondary | ICD-10-CM | POA: Diagnosis not present

## 2017-02-12 ENCOUNTER — Other Ambulatory Visit: Payer: Self-pay | Admitting: Interventional Cardiology

## 2017-02-12 DIAGNOSIS — I679 Cerebrovascular disease, unspecified: Secondary | ICD-10-CM | POA: Diagnosis not present

## 2017-02-12 DIAGNOSIS — K219 Gastro-esophageal reflux disease without esophagitis: Secondary | ICD-10-CM | POA: Diagnosis not present

## 2017-02-12 DIAGNOSIS — J449 Chronic obstructive pulmonary disease, unspecified: Secondary | ICD-10-CM | POA: Diagnosis not present

## 2017-02-12 DIAGNOSIS — J309 Allergic rhinitis, unspecified: Secondary | ICD-10-CM | POA: Diagnosis not present

## 2017-02-12 DIAGNOSIS — I350 Nonrheumatic aortic (valve) stenosis: Secondary | ICD-10-CM | POA: Diagnosis not present

## 2017-02-12 DIAGNOSIS — H1032 Unspecified acute conjunctivitis, left eye: Secondary | ICD-10-CM | POA: Diagnosis not present

## 2017-02-12 DIAGNOSIS — I509 Heart failure, unspecified: Secondary | ICD-10-CM | POA: Diagnosis not present

## 2017-02-12 DIAGNOSIS — R269 Unspecified abnormalities of gait and mobility: Secondary | ICD-10-CM | POA: Diagnosis not present

## 2017-02-12 DIAGNOSIS — I4891 Unspecified atrial fibrillation: Secondary | ICD-10-CM | POA: Diagnosis not present

## 2017-02-12 DIAGNOSIS — I519 Heart disease, unspecified: Secondary | ICD-10-CM | POA: Diagnosis not present

## 2017-02-12 DIAGNOSIS — M25511 Pain in right shoulder: Secondary | ICD-10-CM | POA: Diagnosis not present

## 2017-02-14 DIAGNOSIS — J449 Chronic obstructive pulmonary disease, unspecified: Secondary | ICD-10-CM | POA: Diagnosis not present

## 2017-02-14 DIAGNOSIS — I679 Cerebrovascular disease, unspecified: Secondary | ICD-10-CM | POA: Diagnosis not present

## 2017-02-14 DIAGNOSIS — I4891 Unspecified atrial fibrillation: Secondary | ICD-10-CM | POA: Diagnosis not present

## 2017-02-14 DIAGNOSIS — I519 Heart disease, unspecified: Secondary | ICD-10-CM | POA: Diagnosis not present

## 2017-02-14 DIAGNOSIS — I350 Nonrheumatic aortic (valve) stenosis: Secondary | ICD-10-CM | POA: Diagnosis not present

## 2017-02-14 DIAGNOSIS — I509 Heart failure, unspecified: Secondary | ICD-10-CM | POA: Diagnosis not present

## 2017-02-17 DIAGNOSIS — M7500 Adhesive capsulitis of unspecified shoulder: Secondary | ICD-10-CM | POA: Diagnosis not present

## 2017-02-17 DIAGNOSIS — F4322 Adjustment disorder with anxiety: Secondary | ICD-10-CM | POA: Diagnosis not present

## 2017-02-17 DIAGNOSIS — I519 Heart disease, unspecified: Secondary | ICD-10-CM | POA: Diagnosis not present

## 2017-02-17 DIAGNOSIS — I4891 Unspecified atrial fibrillation: Secondary | ICD-10-CM | POA: Diagnosis not present

## 2017-02-17 DIAGNOSIS — J449 Chronic obstructive pulmonary disease, unspecified: Secondary | ICD-10-CM | POA: Diagnosis not present

## 2017-02-17 DIAGNOSIS — I679 Cerebrovascular disease, unspecified: Secondary | ICD-10-CM | POA: Diagnosis not present

## 2017-02-17 DIAGNOSIS — I950 Idiopathic hypotension: Secondary | ICD-10-CM | POA: Diagnosis not present

## 2017-02-17 DIAGNOSIS — I350 Nonrheumatic aortic (valve) stenosis: Secondary | ICD-10-CM | POA: Diagnosis not present

## 2017-02-17 DIAGNOSIS — I509 Heart failure, unspecified: Secondary | ICD-10-CM | POA: Diagnosis not present

## 2017-02-18 DIAGNOSIS — I509 Heart failure, unspecified: Secondary | ICD-10-CM | POA: Diagnosis not present

## 2017-02-18 DIAGNOSIS — I679 Cerebrovascular disease, unspecified: Secondary | ICD-10-CM | POA: Diagnosis not present

## 2017-02-18 DIAGNOSIS — I350 Nonrheumatic aortic (valve) stenosis: Secondary | ICD-10-CM | POA: Diagnosis not present

## 2017-02-18 DIAGNOSIS — I4891 Unspecified atrial fibrillation: Secondary | ICD-10-CM | POA: Diagnosis not present

## 2017-02-18 DIAGNOSIS — J449 Chronic obstructive pulmonary disease, unspecified: Secondary | ICD-10-CM | POA: Diagnosis not present

## 2017-02-18 DIAGNOSIS — I519 Heart disease, unspecified: Secondary | ICD-10-CM | POA: Diagnosis not present

## 2017-02-19 DIAGNOSIS — I519 Heart disease, unspecified: Secondary | ICD-10-CM | POA: Diagnosis not present

## 2017-02-19 DIAGNOSIS — I4891 Unspecified atrial fibrillation: Secondary | ICD-10-CM | POA: Diagnosis not present

## 2017-02-19 DIAGNOSIS — I509 Heart failure, unspecified: Secondary | ICD-10-CM | POA: Diagnosis not present

## 2017-02-19 DIAGNOSIS — J449 Chronic obstructive pulmonary disease, unspecified: Secondary | ICD-10-CM | POA: Diagnosis not present

## 2017-02-19 DIAGNOSIS — I679 Cerebrovascular disease, unspecified: Secondary | ICD-10-CM | POA: Diagnosis not present

## 2017-02-19 DIAGNOSIS — I350 Nonrheumatic aortic (valve) stenosis: Secondary | ICD-10-CM | POA: Diagnosis not present

## 2017-02-20 DIAGNOSIS — J449 Chronic obstructive pulmonary disease, unspecified: Secondary | ICD-10-CM | POA: Diagnosis not present

## 2017-02-20 DIAGNOSIS — I350 Nonrheumatic aortic (valve) stenosis: Secondary | ICD-10-CM | POA: Diagnosis not present

## 2017-02-20 DIAGNOSIS — I4891 Unspecified atrial fibrillation: Secondary | ICD-10-CM | POA: Diagnosis not present

## 2017-02-20 DIAGNOSIS — I679 Cerebrovascular disease, unspecified: Secondary | ICD-10-CM | POA: Diagnosis not present

## 2017-02-20 DIAGNOSIS — I519 Heart disease, unspecified: Secondary | ICD-10-CM | POA: Diagnosis not present

## 2017-02-20 DIAGNOSIS — I509 Heart failure, unspecified: Secondary | ICD-10-CM | POA: Diagnosis not present

## 2017-02-21 DIAGNOSIS — I350 Nonrheumatic aortic (valve) stenosis: Secondary | ICD-10-CM | POA: Diagnosis not present

## 2017-02-21 DIAGNOSIS — I509 Heart failure, unspecified: Secondary | ICD-10-CM | POA: Diagnosis not present

## 2017-02-21 DIAGNOSIS — I679 Cerebrovascular disease, unspecified: Secondary | ICD-10-CM | POA: Diagnosis not present

## 2017-02-21 DIAGNOSIS — I519 Heart disease, unspecified: Secondary | ICD-10-CM | POA: Diagnosis not present

## 2017-02-21 DIAGNOSIS — J449 Chronic obstructive pulmonary disease, unspecified: Secondary | ICD-10-CM | POA: Diagnosis not present

## 2017-02-21 DIAGNOSIS — I4891 Unspecified atrial fibrillation: Secondary | ICD-10-CM | POA: Diagnosis not present

## 2017-02-24 DIAGNOSIS — H10413 Chronic giant papillary conjunctivitis, bilateral: Secondary | ICD-10-CM | POA: Diagnosis not present

## 2017-02-24 DIAGNOSIS — H1045 Other chronic allergic conjunctivitis: Secondary | ICD-10-CM | POA: Diagnosis not present

## 2017-02-24 DIAGNOSIS — I482 Chronic atrial fibrillation: Secondary | ICD-10-CM | POA: Diagnosis not present

## 2017-02-25 DIAGNOSIS — I519 Heart disease, unspecified: Secondary | ICD-10-CM | POA: Diagnosis not present

## 2017-02-25 DIAGNOSIS — I509 Heart failure, unspecified: Secondary | ICD-10-CM | POA: Diagnosis not present

## 2017-02-25 DIAGNOSIS — J449 Chronic obstructive pulmonary disease, unspecified: Secondary | ICD-10-CM | POA: Diagnosis not present

## 2017-02-25 DIAGNOSIS — I679 Cerebrovascular disease, unspecified: Secondary | ICD-10-CM | POA: Diagnosis not present

## 2017-02-25 DIAGNOSIS — I4891 Unspecified atrial fibrillation: Secondary | ICD-10-CM | POA: Diagnosis not present

## 2017-02-25 DIAGNOSIS — I350 Nonrheumatic aortic (valve) stenosis: Secondary | ICD-10-CM | POA: Diagnosis not present

## 2017-02-26 DIAGNOSIS — I4891 Unspecified atrial fibrillation: Secondary | ICD-10-CM | POA: Diagnosis not present

## 2017-02-26 DIAGNOSIS — I679 Cerebrovascular disease, unspecified: Secondary | ICD-10-CM | POA: Diagnosis not present

## 2017-02-26 DIAGNOSIS — I519 Heart disease, unspecified: Secondary | ICD-10-CM | POA: Diagnosis not present

## 2017-02-26 DIAGNOSIS — I509 Heart failure, unspecified: Secondary | ICD-10-CM | POA: Diagnosis not present

## 2017-02-26 DIAGNOSIS — J449 Chronic obstructive pulmonary disease, unspecified: Secondary | ICD-10-CM | POA: Diagnosis not present

## 2017-02-26 DIAGNOSIS — I350 Nonrheumatic aortic (valve) stenosis: Secondary | ICD-10-CM | POA: Diagnosis not present

## 2017-02-27 DIAGNOSIS — L605 Yellow nail syndrome: Secondary | ICD-10-CM | POA: Diagnosis not present

## 2017-02-27 DIAGNOSIS — B351 Tinea unguium: Secondary | ICD-10-CM | POA: Diagnosis not present

## 2017-02-27 DIAGNOSIS — M2040 Other hammer toe(s) (acquired), unspecified foot: Secondary | ICD-10-CM | POA: Diagnosis not present

## 2017-02-27 DIAGNOSIS — I739 Peripheral vascular disease, unspecified: Secondary | ICD-10-CM | POA: Diagnosis not present

## 2017-02-27 DIAGNOSIS — L84 Corns and callosities: Secondary | ICD-10-CM | POA: Diagnosis not present

## 2017-02-28 DIAGNOSIS — I509 Heart failure, unspecified: Secondary | ICD-10-CM | POA: Diagnosis not present

## 2017-02-28 DIAGNOSIS — I350 Nonrheumatic aortic (valve) stenosis: Secondary | ICD-10-CM | POA: Diagnosis not present

## 2017-02-28 DIAGNOSIS — I519 Heart disease, unspecified: Secondary | ICD-10-CM | POA: Diagnosis not present

## 2017-02-28 DIAGNOSIS — I679 Cerebrovascular disease, unspecified: Secondary | ICD-10-CM | POA: Diagnosis not present

## 2017-02-28 DIAGNOSIS — I4891 Unspecified atrial fibrillation: Secondary | ICD-10-CM | POA: Diagnosis not present

## 2017-02-28 DIAGNOSIS — J449 Chronic obstructive pulmonary disease, unspecified: Secondary | ICD-10-CM | POA: Diagnosis not present

## 2017-03-01 DIAGNOSIS — R54 Age-related physical debility: Secondary | ICD-10-CM | POA: Diagnosis not present

## 2017-03-01 DIAGNOSIS — J449 Chronic obstructive pulmonary disease, unspecified: Secondary | ICD-10-CM | POA: Diagnosis not present

## 2017-03-01 DIAGNOSIS — I10 Essential (primary) hypertension: Secondary | ICD-10-CM | POA: Diagnosis not present

## 2017-03-01 DIAGNOSIS — S065X0S Traumatic subdural hemorrhage without loss of consciousness, sequela: Secondary | ICD-10-CM | POA: Diagnosis not present

## 2017-03-01 DIAGNOSIS — J309 Allergic rhinitis, unspecified: Secondary | ICD-10-CM | POA: Diagnosis not present

## 2017-03-01 DIAGNOSIS — I519 Heart disease, unspecified: Secondary | ICD-10-CM | POA: Diagnosis not present

## 2017-03-01 DIAGNOSIS — F411 Generalized anxiety disorder: Secondary | ICD-10-CM | POA: Diagnosis not present

## 2017-03-01 DIAGNOSIS — I350 Nonrheumatic aortic (valve) stenosis: Secondary | ICD-10-CM | POA: Diagnosis not present

## 2017-03-01 DIAGNOSIS — I679 Cerebrovascular disease, unspecified: Secondary | ICD-10-CM | POA: Diagnosis not present

## 2017-03-01 DIAGNOSIS — I4891 Unspecified atrial fibrillation: Secondary | ICD-10-CM | POA: Diagnosis not present

## 2017-03-01 DIAGNOSIS — I509 Heart failure, unspecified: Secondary | ICD-10-CM | POA: Diagnosis not present

## 2017-03-04 DIAGNOSIS — I350 Nonrheumatic aortic (valve) stenosis: Secondary | ICD-10-CM | POA: Diagnosis not present

## 2017-03-04 DIAGNOSIS — I509 Heart failure, unspecified: Secondary | ICD-10-CM | POA: Diagnosis not present

## 2017-03-04 DIAGNOSIS — I679 Cerebrovascular disease, unspecified: Secondary | ICD-10-CM | POA: Diagnosis not present

## 2017-03-04 DIAGNOSIS — I4891 Unspecified atrial fibrillation: Secondary | ICD-10-CM | POA: Diagnosis not present

## 2017-03-04 DIAGNOSIS — J449 Chronic obstructive pulmonary disease, unspecified: Secondary | ICD-10-CM | POA: Diagnosis not present

## 2017-03-04 DIAGNOSIS — I519 Heart disease, unspecified: Secondary | ICD-10-CM | POA: Diagnosis not present

## 2017-03-05 ENCOUNTER — Telehealth: Payer: Self-pay | Admitting: Cardiovascular Disease

## 2017-03-05 NOTE — Telephone Encounter (Signed)
Patient's daughter states that her mother lives at the [facility] and normally gets her Eliquis filled quarterly through [facility]. She states that she has not received a new supply and wants to know if the Rx has been cancelled. Patient was seen in the ER on 6/15 for epistaxis and was told to hold eliquis until ENT follow-up. Daughter states that the ENT was okay with her restarting her Eliquis and she has been taking it. Daughter is wanting to know if a new Rx can be sent in. Will forward to [facility] for review.

## 2017-03-05 NOTE — Telephone Encounter (Signed)
New Message   Pt c/o medication issue:  1. Name of Medication: Eliquis  2. How are you currently taking this medication (dosage and times per day)? As prescribed  3. Are you having a reaction (difficulty breathing--STAT)? No  4. What is your medication issue? Daughter wants to know if patient's prescription has been cancelled because she hasn't received

## 2017-03-06 DIAGNOSIS — I509 Heart failure, unspecified: Secondary | ICD-10-CM | POA: Diagnosis not present

## 2017-03-06 DIAGNOSIS — I519 Heart disease, unspecified: Secondary | ICD-10-CM | POA: Diagnosis not present

## 2017-03-06 DIAGNOSIS — J449 Chronic obstructive pulmonary disease, unspecified: Secondary | ICD-10-CM | POA: Diagnosis not present

## 2017-03-06 DIAGNOSIS — I679 Cerebrovascular disease, unspecified: Secondary | ICD-10-CM | POA: Diagnosis not present

## 2017-03-06 DIAGNOSIS — I350 Nonrheumatic aortic (valve) stenosis: Secondary | ICD-10-CM | POA: Diagnosis not present

## 2017-03-06 DIAGNOSIS — I4891 Unspecified atrial fibrillation: Secondary | ICD-10-CM | POA: Diagnosis not present

## 2017-03-06 NOTE — Telephone Encounter (Addendum)
Pt's daughter is aware. Dr Angelena Form is in the office today and signed BMS application - form placed on Kim's desk.

## 2017-03-06 NOTE — Telephone Encounter (Signed)
Since pt has been given ok by ENT to resume Eliquis and she was still on therapy at her last cardiac visit, should be ok to refill Eliquis. Her CHADS2VASc score is elevated at 80 (age x2, sex, CHF, two strokes, CAD) so pt is at a high risk of stroke.  Pt qualifies for Eliquis 2.5mg  BID based on reduced weight of 121 lbs and age > 47.  Pt's daughter states pt is getting Eliquis from [facility] for free, not from [place].  Will route to [place] to see if BMS Eliquis application can be updated with Eliquis 2.5mg  BID dosing. Pt's daughter is aware to cut current 5mg  tablets in half until they receive their next shipment. Her daughter would also like a call back to confirm that the application can be updated with the new prescription strength.

## 2017-03-06 NOTE — Telephone Encounter (Signed)
There has been a dose change is patients ELIQUIS, I called BMS and they confirmed all they need is a new prescription, I have completed filling out and will place in box for Dr Angelena Form to sign. He returns to office on 03/17/2017.

## 2017-03-07 DIAGNOSIS — I509 Heart failure, unspecified: Secondary | ICD-10-CM | POA: Diagnosis not present

## 2017-03-07 DIAGNOSIS — I350 Nonrheumatic aortic (valve) stenosis: Secondary | ICD-10-CM | POA: Diagnosis not present

## 2017-03-07 DIAGNOSIS — J449 Chronic obstructive pulmonary disease, unspecified: Secondary | ICD-10-CM | POA: Diagnosis not present

## 2017-03-07 DIAGNOSIS — I519 Heart disease, unspecified: Secondary | ICD-10-CM | POA: Diagnosis not present

## 2017-03-07 DIAGNOSIS — I679 Cerebrovascular disease, unspecified: Secondary | ICD-10-CM | POA: Diagnosis not present

## 2017-03-07 DIAGNOSIS — I4891 Unspecified atrial fibrillation: Secondary | ICD-10-CM | POA: Diagnosis not present

## 2017-03-08 DIAGNOSIS — I519 Heart disease, unspecified: Secondary | ICD-10-CM | POA: Diagnosis not present

## 2017-03-08 DIAGNOSIS — I4891 Unspecified atrial fibrillation: Secondary | ICD-10-CM | POA: Diagnosis not present

## 2017-03-08 DIAGNOSIS — I679 Cerebrovascular disease, unspecified: Secondary | ICD-10-CM | POA: Diagnosis not present

## 2017-03-08 DIAGNOSIS — I350 Nonrheumatic aortic (valve) stenosis: Secondary | ICD-10-CM | POA: Diagnosis not present

## 2017-03-08 DIAGNOSIS — J449 Chronic obstructive pulmonary disease, unspecified: Secondary | ICD-10-CM | POA: Diagnosis not present

## 2017-03-08 DIAGNOSIS — I509 Heart failure, unspecified: Secondary | ICD-10-CM | POA: Diagnosis not present

## 2017-03-10 ENCOUNTER — Telehealth: Payer: Self-pay

## 2017-03-10 DIAGNOSIS — Z7901 Long term (current) use of anticoagulants: Secondary | ICD-10-CM | POA: Diagnosis not present

## 2017-03-10 DIAGNOSIS — I951 Orthostatic hypotension: Secondary | ICD-10-CM | POA: Diagnosis not present

## 2017-03-10 DIAGNOSIS — F0630 Mood disorder due to known physiological condition, unspecified: Secondary | ICD-10-CM | POA: Diagnosis not present

## 2017-03-10 DIAGNOSIS — J449 Chronic obstructive pulmonary disease, unspecified: Secondary | ICD-10-CM | POA: Diagnosis not present

## 2017-03-10 DIAGNOSIS — F411 Generalized anxiety disorder: Secondary | ICD-10-CM | POA: Diagnosis not present

## 2017-03-10 DIAGNOSIS — R682 Dry mouth, unspecified: Secondary | ICD-10-CM | POA: Diagnosis not present

## 2017-03-10 DIAGNOSIS — I509 Heart failure, unspecified: Secondary | ICD-10-CM | POA: Diagnosis not present

## 2017-03-10 DIAGNOSIS — M940 Chondrocostal junction syndrome [Tietze]: Secondary | ICD-10-CM | POA: Diagnosis not present

## 2017-03-10 NOTE — Telephone Encounter (Signed)
MD portion of patient assistance form for Eliquis 2.5mg  faxed to [place].

## 2017-03-11 DIAGNOSIS — J449 Chronic obstructive pulmonary disease, unspecified: Secondary | ICD-10-CM | POA: Diagnosis not present

## 2017-03-11 DIAGNOSIS — I519 Heart disease, unspecified: Secondary | ICD-10-CM | POA: Diagnosis not present

## 2017-03-11 DIAGNOSIS — I4891 Unspecified atrial fibrillation: Secondary | ICD-10-CM | POA: Diagnosis not present

## 2017-03-11 DIAGNOSIS — I509 Heart failure, unspecified: Secondary | ICD-10-CM | POA: Diagnosis not present

## 2017-03-11 DIAGNOSIS — I679 Cerebrovascular disease, unspecified: Secondary | ICD-10-CM | POA: Diagnosis not present

## 2017-03-11 DIAGNOSIS — I350 Nonrheumatic aortic (valve) stenosis: Secondary | ICD-10-CM | POA: Diagnosis not present

## 2017-03-13 DIAGNOSIS — I679 Cerebrovascular disease, unspecified: Secondary | ICD-10-CM | POA: Diagnosis not present

## 2017-03-13 DIAGNOSIS — I350 Nonrheumatic aortic (valve) stenosis: Secondary | ICD-10-CM | POA: Diagnosis not present

## 2017-03-13 DIAGNOSIS — I519 Heart disease, unspecified: Secondary | ICD-10-CM | POA: Diagnosis not present

## 2017-03-13 DIAGNOSIS — I4891 Unspecified atrial fibrillation: Secondary | ICD-10-CM | POA: Diagnosis not present

## 2017-03-13 DIAGNOSIS — I509 Heart failure, unspecified: Secondary | ICD-10-CM | POA: Diagnosis not present

## 2017-03-13 DIAGNOSIS — J449 Chronic obstructive pulmonary disease, unspecified: Secondary | ICD-10-CM | POA: Diagnosis not present

## 2017-03-14 DIAGNOSIS — I4891 Unspecified atrial fibrillation: Secondary | ICD-10-CM | POA: Diagnosis not present

## 2017-03-14 DIAGNOSIS — J449 Chronic obstructive pulmonary disease, unspecified: Secondary | ICD-10-CM | POA: Diagnosis not present

## 2017-03-14 DIAGNOSIS — I519 Heart disease, unspecified: Secondary | ICD-10-CM | POA: Diagnosis not present

## 2017-03-14 DIAGNOSIS — I509 Heart failure, unspecified: Secondary | ICD-10-CM | POA: Diagnosis not present

## 2017-03-14 DIAGNOSIS — I679 Cerebrovascular disease, unspecified: Secondary | ICD-10-CM | POA: Diagnosis not present

## 2017-03-14 DIAGNOSIS — I350 Nonrheumatic aortic (valve) stenosis: Secondary | ICD-10-CM | POA: Diagnosis not present

## 2017-03-16 DIAGNOSIS — I350 Nonrheumatic aortic (valve) stenosis: Secondary | ICD-10-CM | POA: Diagnosis not present

## 2017-03-16 DIAGNOSIS — J449 Chronic obstructive pulmonary disease, unspecified: Secondary | ICD-10-CM | POA: Diagnosis not present

## 2017-03-16 DIAGNOSIS — I4891 Unspecified atrial fibrillation: Secondary | ICD-10-CM | POA: Diagnosis not present

## 2017-03-16 DIAGNOSIS — I509 Heart failure, unspecified: Secondary | ICD-10-CM | POA: Diagnosis not present

## 2017-03-16 DIAGNOSIS — I679 Cerebrovascular disease, unspecified: Secondary | ICD-10-CM | POA: Diagnosis not present

## 2017-03-16 DIAGNOSIS — I519 Heart disease, unspecified: Secondary | ICD-10-CM | POA: Diagnosis not present

## 2017-03-18 DIAGNOSIS — I4891 Unspecified atrial fibrillation: Secondary | ICD-10-CM | POA: Diagnosis not present

## 2017-03-18 DIAGNOSIS — I350 Nonrheumatic aortic (valve) stenosis: Secondary | ICD-10-CM | POA: Diagnosis not present

## 2017-03-18 DIAGNOSIS — J449 Chronic obstructive pulmonary disease, unspecified: Secondary | ICD-10-CM | POA: Diagnosis not present

## 2017-03-18 DIAGNOSIS — I509 Heart failure, unspecified: Secondary | ICD-10-CM | POA: Diagnosis not present

## 2017-03-18 DIAGNOSIS — I519 Heart disease, unspecified: Secondary | ICD-10-CM | POA: Diagnosis not present

## 2017-03-18 DIAGNOSIS — I679 Cerebrovascular disease, unspecified: Secondary | ICD-10-CM | POA: Diagnosis not present

## 2017-03-19 DIAGNOSIS — I519 Heart disease, unspecified: Secondary | ICD-10-CM | POA: Diagnosis not present

## 2017-03-19 DIAGNOSIS — I679 Cerebrovascular disease, unspecified: Secondary | ICD-10-CM | POA: Diagnosis not present

## 2017-03-19 DIAGNOSIS — I509 Heart failure, unspecified: Secondary | ICD-10-CM | POA: Diagnosis not present

## 2017-03-19 DIAGNOSIS — I350 Nonrheumatic aortic (valve) stenosis: Secondary | ICD-10-CM | POA: Diagnosis not present

## 2017-03-19 DIAGNOSIS — I4891 Unspecified atrial fibrillation: Secondary | ICD-10-CM | POA: Diagnosis not present

## 2017-03-19 DIAGNOSIS — J449 Chronic obstructive pulmonary disease, unspecified: Secondary | ICD-10-CM | POA: Diagnosis not present

## 2017-03-21 DIAGNOSIS — I350 Nonrheumatic aortic (valve) stenosis: Secondary | ICD-10-CM | POA: Diagnosis not present

## 2017-03-21 DIAGNOSIS — I519 Heart disease, unspecified: Secondary | ICD-10-CM | POA: Diagnosis not present

## 2017-03-21 DIAGNOSIS — J449 Chronic obstructive pulmonary disease, unspecified: Secondary | ICD-10-CM | POA: Diagnosis not present

## 2017-03-21 DIAGNOSIS — I509 Heart failure, unspecified: Secondary | ICD-10-CM | POA: Diagnosis not present

## 2017-03-21 DIAGNOSIS — I4891 Unspecified atrial fibrillation: Secondary | ICD-10-CM | POA: Diagnosis not present

## 2017-03-21 DIAGNOSIS — I679 Cerebrovascular disease, unspecified: Secondary | ICD-10-CM | POA: Diagnosis not present

## 2017-03-23 DIAGNOSIS — I509 Heart failure, unspecified: Secondary | ICD-10-CM | POA: Diagnosis not present

## 2017-03-23 DIAGNOSIS — I679 Cerebrovascular disease, unspecified: Secondary | ICD-10-CM | POA: Diagnosis not present

## 2017-03-23 DIAGNOSIS — I4891 Unspecified atrial fibrillation: Secondary | ICD-10-CM | POA: Diagnosis not present

## 2017-03-23 DIAGNOSIS — I519 Heart disease, unspecified: Secondary | ICD-10-CM | POA: Diagnosis not present

## 2017-03-23 DIAGNOSIS — I350 Nonrheumatic aortic (valve) stenosis: Secondary | ICD-10-CM | POA: Diagnosis not present

## 2017-03-23 DIAGNOSIS — J449 Chronic obstructive pulmonary disease, unspecified: Secondary | ICD-10-CM | POA: Diagnosis not present

## 2017-03-25 DIAGNOSIS — I519 Heart disease, unspecified: Secondary | ICD-10-CM | POA: Diagnosis not present

## 2017-03-25 DIAGNOSIS — I509 Heart failure, unspecified: Secondary | ICD-10-CM | POA: Diagnosis not present

## 2017-03-25 DIAGNOSIS — I350 Nonrheumatic aortic (valve) stenosis: Secondary | ICD-10-CM | POA: Diagnosis not present

## 2017-03-25 DIAGNOSIS — J449 Chronic obstructive pulmonary disease, unspecified: Secondary | ICD-10-CM | POA: Diagnosis not present

## 2017-03-25 DIAGNOSIS — I679 Cerebrovascular disease, unspecified: Secondary | ICD-10-CM | POA: Diagnosis not present

## 2017-03-25 DIAGNOSIS — I4891 Unspecified atrial fibrillation: Secondary | ICD-10-CM | POA: Diagnosis not present

## 2017-03-26 DIAGNOSIS — F419 Anxiety disorder, unspecified: Secondary | ICD-10-CM | POA: Diagnosis not present

## 2017-03-26 DIAGNOSIS — R269 Unspecified abnormalities of gait and mobility: Secondary | ICD-10-CM | POA: Diagnosis not present

## 2017-03-26 DIAGNOSIS — J309 Allergic rhinitis, unspecified: Secondary | ICD-10-CM | POA: Diagnosis not present

## 2017-03-26 DIAGNOSIS — R42 Dizziness and giddiness: Secondary | ICD-10-CM | POA: Diagnosis not present

## 2017-03-26 DIAGNOSIS — J449 Chronic obstructive pulmonary disease, unspecified: Secondary | ICD-10-CM | POA: Diagnosis not present

## 2017-03-26 DIAGNOSIS — K219 Gastro-esophageal reflux disease without esophagitis: Secondary | ICD-10-CM | POA: Diagnosis not present

## 2017-03-26 DIAGNOSIS — M25511 Pain in right shoulder: Secondary | ICD-10-CM | POA: Diagnosis not present

## 2017-03-26 DIAGNOSIS — R635 Abnormal weight gain: Secondary | ICD-10-CM | POA: Diagnosis not present

## 2017-03-26 DIAGNOSIS — I4891 Unspecified atrial fibrillation: Secondary | ICD-10-CM | POA: Diagnosis not present

## 2017-03-27 DIAGNOSIS — I519 Heart disease, unspecified: Secondary | ICD-10-CM | POA: Diagnosis not present

## 2017-03-27 DIAGNOSIS — I350 Nonrheumatic aortic (valve) stenosis: Secondary | ICD-10-CM | POA: Diagnosis not present

## 2017-03-27 DIAGNOSIS — I509 Heart failure, unspecified: Secondary | ICD-10-CM | POA: Diagnosis not present

## 2017-03-27 DIAGNOSIS — I4891 Unspecified atrial fibrillation: Secondary | ICD-10-CM | POA: Diagnosis not present

## 2017-03-27 DIAGNOSIS — J449 Chronic obstructive pulmonary disease, unspecified: Secondary | ICD-10-CM | POA: Diagnosis not present

## 2017-03-27 DIAGNOSIS — I679 Cerebrovascular disease, unspecified: Secondary | ICD-10-CM | POA: Diagnosis not present

## 2017-03-30 DIAGNOSIS — H10413 Chronic giant papillary conjunctivitis, bilateral: Secondary | ICD-10-CM | POA: Diagnosis not present

## 2017-03-30 DIAGNOSIS — F419 Anxiety disorder, unspecified: Secondary | ICD-10-CM | POA: Diagnosis not present

## 2017-03-30 DIAGNOSIS — J441 Chronic obstructive pulmonary disease with (acute) exacerbation: Secondary | ICD-10-CM | POA: Diagnosis not present

## 2017-03-30 DIAGNOSIS — I110 Hypertensive heart disease with heart failure: Secondary | ICD-10-CM | POA: Diagnosis not present

## 2017-03-30 DIAGNOSIS — Z79899 Other long term (current) drug therapy: Secondary | ICD-10-CM | POA: Diagnosis not present

## 2017-03-31 DIAGNOSIS — J441 Chronic obstructive pulmonary disease with (acute) exacerbation: Secondary | ICD-10-CM | POA: Diagnosis not present

## 2017-03-31 DIAGNOSIS — Z9981 Dependence on supplemental oxygen: Secondary | ICD-10-CM | POA: Diagnosis not present

## 2017-03-31 DIAGNOSIS — I509 Heart failure, unspecified: Secondary | ICD-10-CM | POA: Diagnosis not present

## 2017-03-31 DIAGNOSIS — F4322 Adjustment disorder with anxiety: Secondary | ICD-10-CM | POA: Diagnosis not present

## 2017-04-01 DIAGNOSIS — R531 Weakness: Secondary | ICD-10-CM | POA: Diagnosis not present

## 2017-04-04 DIAGNOSIS — R531 Weakness: Secondary | ICD-10-CM | POA: Diagnosis not present

## 2017-04-07 DIAGNOSIS — R05 Cough: Secondary | ICD-10-CM | POA: Diagnosis not present

## 2017-04-08 DIAGNOSIS — F0630 Mood disorder due to known physiological condition, unspecified: Secondary | ICD-10-CM | POA: Diagnosis not present

## 2017-04-08 DIAGNOSIS — I951 Orthostatic hypotension: Secondary | ICD-10-CM | POA: Diagnosis not present

## 2017-04-08 DIAGNOSIS — J449 Chronic obstructive pulmonary disease, unspecified: Secondary | ICD-10-CM | POA: Diagnosis not present

## 2017-04-08 DIAGNOSIS — Z9981 Dependence on supplemental oxygen: Secondary | ICD-10-CM | POA: Diagnosis not present

## 2017-04-08 DIAGNOSIS — I509 Heart failure, unspecified: Secondary | ICD-10-CM | POA: Diagnosis not present

## 2017-04-09 DIAGNOSIS — I509 Heart failure, unspecified: Secondary | ICD-10-CM | POA: Diagnosis not present

## 2017-04-09 DIAGNOSIS — F419 Anxiety disorder, unspecified: Secondary | ICD-10-CM | POA: Diagnosis not present

## 2017-04-09 DIAGNOSIS — M25511 Pain in right shoulder: Secondary | ICD-10-CM | POA: Diagnosis not present

## 2017-04-09 DIAGNOSIS — J449 Chronic obstructive pulmonary disease, unspecified: Secondary | ICD-10-CM | POA: Diagnosis not present

## 2017-04-09 DIAGNOSIS — I110 Hypertensive heart disease with heart failure: Secondary | ICD-10-CM | POA: Diagnosis not present

## 2017-04-09 DIAGNOSIS — R42 Dizziness and giddiness: Secondary | ICD-10-CM | POA: Diagnosis not present

## 2017-04-09 DIAGNOSIS — J309 Allergic rhinitis, unspecified: Secondary | ICD-10-CM | POA: Diagnosis not present

## 2017-04-09 DIAGNOSIS — R269 Unspecified abnormalities of gait and mobility: Secondary | ICD-10-CM | POA: Diagnosis not present

## 2017-04-09 DIAGNOSIS — I4891 Unspecified atrial fibrillation: Secondary | ICD-10-CM | POA: Diagnosis not present

## 2017-04-10 DIAGNOSIS — R093 Abnormal sputum: Secondary | ICD-10-CM | POA: Diagnosis not present

## 2017-04-21 ENCOUNTER — Inpatient Hospital Stay (HOSPITAL_COMMUNITY)
Admission: EM | Admit: 2017-04-21 | Discharge: 2017-04-25 | DRG: 291 | Disposition: A | Discharge status: Home Health | Payer: Medicare Other | Attending: Family Medicine | Admitting: Family Medicine

## 2017-04-21 ENCOUNTER — Emergency Department (HOSPITAL_COMMUNITY): Payer: Medicare Other

## 2017-04-21 DIAGNOSIS — I5084 End stage heart failure: Secondary | ICD-10-CM | POA: Diagnosis present

## 2017-04-21 DIAGNOSIS — I10 Essential (primary) hypertension: Secondary | ICD-10-CM | POA: Diagnosis not present

## 2017-04-21 DIAGNOSIS — Z823 Family history of stroke: Secondary | ICD-10-CM | POA: Diagnosis not present

## 2017-04-21 DIAGNOSIS — Z9842 Cataract extraction status, left eye: Secondary | ICD-10-CM | POA: Diagnosis not present

## 2017-04-21 DIAGNOSIS — Z85828 Personal history of other malignant neoplasm of skin: Secondary | ICD-10-CM

## 2017-04-21 DIAGNOSIS — J9621 Acute and chronic respiratory failure with hypoxia: Secondary | ICD-10-CM | POA: Diagnosis present

## 2017-04-21 DIAGNOSIS — E876 Hypokalemia: Secondary | ICD-10-CM | POA: Diagnosis not present

## 2017-04-21 DIAGNOSIS — J80 Acute respiratory distress syndrome: Secondary | ICD-10-CM | POA: Diagnosis not present

## 2017-04-21 DIAGNOSIS — J449 Chronic obstructive pulmonary disease, unspecified: Secondary | ICD-10-CM | POA: Diagnosis present

## 2017-04-21 DIAGNOSIS — Z9981 Dependence on supplemental oxygen: Secondary | ICD-10-CM | POA: Diagnosis not present

## 2017-04-21 DIAGNOSIS — Z23 Encounter for immunization: Secondary | ICD-10-CM | POA: Diagnosis not present

## 2017-04-21 DIAGNOSIS — F419 Anxiety disorder, unspecified: Secondary | ICD-10-CM | POA: Diagnosis present

## 2017-04-21 DIAGNOSIS — Z8673 Personal history of transient ischemic attack (TIA), and cerebral infarction without residual deficits: Secondary | ICD-10-CM | POA: Diagnosis not present

## 2017-04-21 DIAGNOSIS — M419 Scoliosis, unspecified: Secondary | ICD-10-CM | POA: Diagnosis present

## 2017-04-21 DIAGNOSIS — J9611 Chronic respiratory failure with hypoxia: Secondary | ICD-10-CM | POA: Diagnosis not present

## 2017-04-21 DIAGNOSIS — K219 Gastro-esophageal reflux disease without esophagitis: Secondary | ICD-10-CM | POA: Diagnosis present

## 2017-04-21 DIAGNOSIS — I5023 Acute on chronic systolic (congestive) heart failure: Secondary | ICD-10-CM | POA: Diagnosis present

## 2017-04-21 DIAGNOSIS — Z8249 Family history of ischemic heart disease and other diseases of the circulatory system: Secondary | ICD-10-CM | POA: Diagnosis not present

## 2017-04-21 DIAGNOSIS — Z9841 Cataract extraction status, right eye: Secondary | ICD-10-CM | POA: Diagnosis not present

## 2017-04-21 DIAGNOSIS — Z7901 Long term (current) use of anticoagulants: Secondary | ICD-10-CM | POA: Diagnosis not present

## 2017-04-21 DIAGNOSIS — I482 Chronic atrial fibrillation, unspecified: Secondary | ICD-10-CM | POA: Diagnosis present

## 2017-04-21 DIAGNOSIS — F411 Generalized anxiety disorder: Secondary | ICD-10-CM | POA: Diagnosis not present

## 2017-04-21 DIAGNOSIS — I110 Hypertensive heart disease with heart failure: Secondary | ICD-10-CM | POA: Diagnosis not present

## 2017-04-21 DIAGNOSIS — R9431 Abnormal electrocardiogram [ECG] [EKG]: Secondary | ICD-10-CM | POA: Diagnosis not present

## 2017-04-21 DIAGNOSIS — I350 Nonrheumatic aortic (valve) stenosis: Secondary | ICD-10-CM | POA: Diagnosis not present

## 2017-04-21 DIAGNOSIS — I340 Nonrheumatic mitral (valve) insufficiency: Secondary | ICD-10-CM | POA: Diagnosis not present

## 2017-04-21 DIAGNOSIS — I5040 Unspecified combined systolic (congestive) and diastolic (congestive) heart failure: Secondary | ICD-10-CM | POA: Diagnosis not present

## 2017-04-21 DIAGNOSIS — I272 Pulmonary hypertension, unspecified: Secondary | ICD-10-CM | POA: Diagnosis present

## 2017-04-21 DIAGNOSIS — R0602 Shortness of breath: Secondary | ICD-10-CM | POA: Diagnosis not present

## 2017-04-21 DIAGNOSIS — Z961 Presence of intraocular lens: Secondary | ICD-10-CM | POA: Diagnosis present

## 2017-04-21 DIAGNOSIS — I351 Nonrheumatic aortic (valve) insufficiency: Secondary | ICD-10-CM | POA: Diagnosis not present

## 2017-04-21 DIAGNOSIS — I5043 Acute on chronic combined systolic (congestive) and diastolic (congestive) heart failure: Secondary | ICD-10-CM | POA: Diagnosis present

## 2017-04-21 DIAGNOSIS — Z66 Do not resuscitate: Secondary | ICD-10-CM | POA: Diagnosis present

## 2017-04-21 DIAGNOSIS — F331 Major depressive disorder, recurrent, moderate: Secondary | ICD-10-CM | POA: Diagnosis not present

## 2017-04-21 LAB — BASIC METABOLIC PANEL
ANION GAP: 9 (ref 5–15)
Anion gap: 5 (ref 5–15)
BUN: 13 mg/dL (ref 6–20)
BUN: 13 mg/dL (ref 6–20)
CALCIUM: 8.4 mg/dL — AB (ref 8.9–10.3)
CALCIUM: 8.4 mg/dL — AB (ref 8.9–10.3)
CHLORIDE: 102 mmol/L (ref 101–111)
CO2: 27 mmol/L (ref 22–32)
CO2: 31 mmol/L (ref 22–32)
CREATININE: 0.92 mg/dL (ref 0.44–1.00)
Chloride: 94 mmol/L — ABNORMAL LOW (ref 101–111)
Creatinine, Ser: 0.78 mg/dL (ref 0.44–1.00)
GFR calc non Af Amer: 53 mL/min — ABNORMAL LOW (ref >60)
GFR calc non Af Amer: >60 mL/min (ref >60)
Glucose, Bld: 95 mg/dL (ref 65–99)
Glucose, Bld: 95 mg/dL (ref 65–99)
POTASSIUM: 3.7 mmol/L (ref 3.5–5.1)
Potassium: 4.4 mmol/L (ref 3.5–5.1)
Sodium: 134 mmol/L — ABNORMAL LOW (ref 135–145)
Sodium: 134 mmol/L — ABNORMAL LOW (ref 135–145)

## 2017-04-21 LAB — CBC
HCT: 40.8 % (ref 36.0–46.0)
Hemoglobin: 13.1 g/dL (ref 12.0–15.0)
MCH: 31.8 pg (ref 26.0–34.0)
MCHC: 32.1 g/dL (ref 30.0–36.0)
MCV: 99 fL (ref 78.0–100.0)
PLATELETS: 279 10*3/uL (ref 150–400)
RBC: 4.12 MIL/uL (ref 3.87–5.11)
RDW: 13.7 % (ref 11.5–15.5)
WBC: 8.4 10*3/uL (ref 4.0–10.5)

## 2017-04-21 LAB — MAGNESIUM: Magnesium: 1.9 mg/dL (ref 1.7–2.4)

## 2017-04-21 LAB — TROPONIN I: TROPONIN I: 0.04 ng/mL — AB (ref <0.03)

## 2017-04-21 LAB — BRAIN NATRIURETIC PEPTIDE: B Natriuretic Peptide: 226.2 pg/mL — ABNORMAL HIGH (ref 0.0–100.0)

## 2017-04-21 MED ORDER — ALPRAZOLAM 0.25 MG PO TABS
0.2500 mg | ORAL_TABLET | Freq: Once | ORAL | Status: AC
  Administered 2017-04-21: 0.25 mg via ORAL
  Filled 2017-04-21: qty 1

## 2017-04-21 MED ORDER — POTASSIUM CHLORIDE CRYS ER 10 MEQ PO TBCR
10.0000 meq | EXTENDED_RELEASE_TABLET | Freq: Every day | ORAL | Status: DC
  Administered 2017-04-21 – 2017-04-25 (×5): 10 meq via ORAL
  Filled 2017-04-21 (×5): qty 1

## 2017-04-21 MED ORDER — FUROSEMIDE 10 MG/ML IJ SOLN
40.0000 mg | Freq: Two times a day (BID) | INTRAMUSCULAR | Status: AC
  Administered 2017-04-21 – 2017-04-22 (×2): 40 mg via INTRAVENOUS
  Filled 2017-04-21 (×2): qty 4

## 2017-04-21 MED ORDER — ALBUTEROL SULFATE (2.5 MG/3ML) 0.083% IN NEBU
3.0000 mL | INHALATION_SOLUTION | RESPIRATORY_TRACT | Status: DC | PRN
  Administered 2017-04-23 – 2017-04-25 (×2): 3 mL via RESPIRATORY_TRACT
  Filled 2017-04-21 (×2): qty 3

## 2017-04-21 MED ORDER — ALPRAZOLAM 0.25 MG PO TABS
0.2500 mg | ORAL_TABLET | Freq: Two times a day (BID) | ORAL | Status: DC
  Administered 2017-04-21 – 2017-04-25 (×8): 0.25 mg via ORAL
  Filled 2017-04-21 (×8): qty 1

## 2017-04-21 MED ORDER — ONDANSETRON HCL 4 MG/2ML IJ SOLN
4.0000 mg | Freq: Four times a day (QID) | INTRAMUSCULAR | Status: DC | PRN
  Administered 2017-04-25: 4 mg via INTRAVENOUS
  Filled 2017-04-21: qty 2

## 2017-04-21 MED ORDER — IPRATROPIUM-ALBUTEROL 0.5-2.5 (3) MG/3ML IN SOLN
3.0000 mL | Freq: Three times a day (TID) | RESPIRATORY_TRACT | Status: DC
  Administered 2017-04-21 – 2017-04-25 (×13): 3 mL via RESPIRATORY_TRACT
  Filled 2017-04-21 (×14): qty 3

## 2017-04-21 MED ORDER — FUROSEMIDE 10 MG/ML IJ SOLN
40.0000 mg | Freq: Once | INTRAMUSCULAR | Status: AC
  Administered 2017-04-21: 40 mg via INTRAVENOUS
  Filled 2017-04-21: qty 4

## 2017-04-21 MED ORDER — ACETAMINOPHEN 325 MG PO TABS
650.0000 mg | ORAL_TABLET | ORAL | Status: DC | PRN
  Administered 2017-04-21 – 2017-04-24 (×3): 650 mg via ORAL
  Filled 2017-04-21 (×3): qty 2

## 2017-04-21 MED ORDER — SODIUM CHLORIDE 0.9 % IV SOLN
250.0000 mL | INTRAVENOUS | Status: DC | PRN
  Administered 2017-04-25: 250 mL via INTRAVENOUS

## 2017-04-21 MED ORDER — ALPRAZOLAM 0.25 MG PO TABS: 0.2500 mg | ORAL_TABLET | Freq: Two times a day (BID) | ORAL | Status: DC

## 2017-04-21 MED ORDER — SODIUM CHLORIDE 0.9% FLUSH
3.0000 mL | Freq: Two times a day (BID) | INTRAVENOUS | Status: DC
  Administered 2017-04-21: 3 mL via INTRAVENOUS
  Administered 2017-04-22: 10 mL via INTRAVENOUS
  Administered 2017-04-22 – 2017-04-25 (×6): 3 mL via INTRAVENOUS

## 2017-04-21 MED ORDER — KETOTIFEN FUMARATE 0.025 % OP SOLN
1.0000 [drp] | Freq: Two times a day (BID) | OPHTHALMIC | Status: DC
  Administered 2017-04-21 – 2017-04-25 (×3): 1 [drp] via OPHTHALMIC
  Filled 2017-04-21: qty 5

## 2017-04-21 MED ORDER — APIXABAN 2.5 MG PO TABS
2.5000 mg | ORAL_TABLET | Freq: Two times a day (BID) | ORAL | Status: DC
  Administered 2017-04-21 – 2017-04-25 (×9): 2.5 mg via ORAL
  Filled 2017-04-21 (×9): qty 1

## 2017-04-21 MED ORDER — SERTRALINE HCL 50 MG PO TABS
25.0000 mg | ORAL_TABLET | Freq: Every day | ORAL | Status: DC
  Administered 2017-04-21 – 2017-04-24 (×4): 25 mg via ORAL
  Filled 2017-04-21 (×4): qty 1

## 2017-04-21 MED ORDER — SODIUM CHLORIDE 0.9% FLUSH: 3.0000 mL | INTRAVENOUS | Status: DC | PRN

## 2017-04-21 MED ORDER — POLYVINYL ALCOHOL 1.4 % OP SOLN
1.0000 [drp] | Freq: Four times a day (QID) | OPHTHALMIC | Status: DC
  Administered 2017-04-21 – 2017-04-25 (×16): 1 [drp] via OPHTHALMIC
  Filled 2017-04-21 (×2): qty 15

## 2017-04-21 NOTE — ED Provider Notes (Signed)
[facility] EMERGENCY DEPARTMENT Provider Note   CSN: [id number] Arrival date & time: 04/21/17  0458     History   Chief Complaint Chief Complaint  Patient presents with  . Shortness of Breath    HPI Caroline Myers is a 83 y.o. female.  Presents with respiratory distress from her assisted living facility.  She reports progressively worsening shortness of breath over the past night.  Found to have her oxygen tubing kinked on EMS arrival.  CHF and atrial fibrillation.  She denies any chest pain but complains that her chest feels tight.  Has not had any productive cough or fever.  No leg swelling.  States compliance with her medications.  Denies any abdominal pain, nausea or vomiting.  Patient has been in hospice in the past for her CHF and COPD but is no longer.  She is DNR and DNI.   The history is provided by the patient, the EMS personnel and a relative. The history is limited by the condition of the patient.  Shortness of Breath  Associated symptoms include cough. Pertinent negatives include no headaches, no rhinorrhea, no chest pain, no vomiting, no abdominal pain and no leg swelling.    Past Medical History:  Diagnosis Date  . Allergic rhinitis   . Anxiety   . Arthritis    "neck, spine, hands" (06/28/2016)  . Asthma 2000   mild. tried Advair after bronchospasm after exposure to cats  . Atrial flutter ([place])   . Chronic combined systolic and diastolic CHF (congestive heart failure) ([place])   . COPD (chronic obstructive pulmonary disease) ([place])   . GERD (gastroesophageal reflux disease)    occ  . Intracranial hemorrhage following injury ([place]) 01/03/2015  . Normal coronary arteries    a. underwent treadmill stress test in April 2008 which shows normal perfusion study, however she had chest pain and EKG changes during the terminal portion concerning for false negative study. She had normal coronaries on cardiac catheterization in May 2008.   Marland Kitchen Permanent  atrial fibrillation (HCC)    pt does not want anticoagulation  . Pneumonia 07/2015  . Scoliosis   . Severe aortic stenosis   . Shoulder pain, left 05/2010   with rotator cuff tear--Dr Alfonso Ramus  . Skin cancer of nose    "MOHS; think it was basal"  . Squamous carcinoma    "burned off my face"  . Stroke [facility]) 1/ 2014; 12/2014   denies residual on 06/28/2016    Patient Active Problem List   Diagnosis Date Noted  . Right-sided epistaxis 12/13/2016  . Intracranial hemorrhage ([place]) 12/09/2016  . Hospice care patient 12/09/2016  . Multiple contusions 12/09/2016  . Nasal fracture 12/09/2016  . AC separation, right, initial encounter 12/09/2016  . Fall at home, initial encounter 12/09/2016  . Chronic combined systolic and diastolic CHF (congestive heart failure) ([place]) 09/19/2016  . Chest pain 08/29/2016  . Concussion 07/06/2016  . CHF exacerbation ([place]) 06/28/2016  . Respiratory failure with hypoxia ([place]) 06/28/2016  . URI (upper respiratory infection) 06/28/2016  . Chest pressure 06/28/2016  . Oral dyskinesia 06/28/2016  . Falls 05/20/2016  . Pulmonary hypertension ([place]) 03/15/2016  . Anxiety about health   . Cough   . Hypokalemia   . AKI (acute kidney injury) ([place])   . Debility 07/20/2015  . Sepsis ([place]) 07/20/2015  . History of CVA (cerebrovascular accident)   . Tachypnea   . Leukocytosis 07/17/2015  . Arterial hypotension   . Hypotension 07/16/2015  .  Elevated troponin 07/16/2015  . PNA (pneumonia)   . Severe sepsis ([place]) 07/15/2015  . Acute respiratory failure with hypercapnia ([place]) 07/15/2015  . Chronic anticoagulation 04/18/2015  . Orthostatic hypotension 03/09/2015  . CVA (cerebral infarction) 02/14/2015  . Stroke ([place]) 02/14/2015  . Stroke with cerebral ischemia ([place])   . Cerebral infarction due to embolism of left middle cerebral artery ([place])   . Occipital fracture ([place])   . Benign essential HTN   . Other allergic rhinitis   . Depression   . SAH (subarachnoid  hemorrhage) ([place]) 01/02/2015  . SDH (subdural hematoma) ([place]) 01/02/2015  . Aortic atherosclerosis ([place]) 08/05/2014  . Nonspecific abnormal unspecified cardiovascular function study 10/28/2013  . Severe aortic stenosis 10/28/2013  . HLD (hyperlipidemia) 07/28/2013  . TIA (transient ischemic attack) 07/27/2013  . History of CHF (congestive heart failure) 07/27/2013  . Urticaria 11/06/2010  . Bronchitis 10/12/2010  . Preventative health care   . Anxiety state 03/29/2010  . RIB PAIN, LEFT SIDED 10/12/2009  . ALLERGIC RHINITIS 06/28/2009  . SHOULDER PAIN, LEFT 06/28/2009  . SCIATICA, RIGHT 06/28/2009  . HEADACHE 06/28/2009  . DYSPNEA ON EXERTION 06/28/2009  . SKIN CANCER, HX OF 06/28/2009    Past Surgical History:  Procedure Laterality Date  . CATARACT EXTRACTION W/ INTRAOCULAR LENS  IMPLANT, BILATERAL Bilateral   . DILATION AND CURETTAGE OF UTERUS    . HERNIA REPAIR  2003  . LUMBAR LAMINECTOMY/DECOMPRESSION MICRODISCECTOMY  04/01/2012   Procedure: LUMBAR LAMINECTOMY/DECOMPRESSION MICRODISCECTOMY 1 LEVEL;  Surgeon: Eustace Moore, MD;  Location: [facility];  Service: Neurosurgery;  Laterality: Right;  Right Lumbar four-five extraforaminal microdiskectomy  . MOHS SURGERY     "nose"  . SHOULDER ARTHROSCOPY Right 2000   ACROMIOPLASTY  . TONSILLECTOMY      OB History    No data available       Home Medications    Prior to Admission medications   Medication Sig Start Date End Date Taking? Authorizing Provider  acetaminophen (TYLENOL) 325 MG tablet Take 650 mg by mouth every 4 (four) hours as needed for mild pain or fever.   Yes [provider]  acetaminophen (TYLENOL) 650 MG suppository Place 650 mg rectally every 4 (four) hours as needed for mild pain or fever.   Yes [provider]  ALPRAZolam (XANAX) 0.25 MG tablet Take 0.25 mg by mouth 2 (two) times daily.    Yes [provider]  apixaban (ELIQUIS) 2.5 MG TABS tablet Take 2.5 mg by mouth 2 (two)  times daily.   Yes [provider]  azelastine (OPTIVAR) 0.05 % ophthalmic solution Place 1 drop into the left eye daily as needed.  11/15/16  Yes [provider]  furosemide (LASIX) 20 MG tablet Take 0.5 tablets (10 mg total) by mouth daily. 12/15/16  Yes Verner Mould, MD  ipratropium-albuterol (DUONEB) 0.5-2.5 (3) MG/3ML SOLN Take 3 mLs by nebulization 3 (three) times daily.  11/14/16  Yes [provider]  ipratropium-albuterol (DUONEB) 0.5-2.5 (3) MG/3ML SOLN Take 3 mLs by nebulization every 4 (four) hours as needed (for shortness of breath).   Yes [provider]  oxymetazoline (AFRIN) 0.05 % nasal spray Place 2 sprays into both nostrils 2 (two) times daily as needed for congestion.   Yes [provider]  polyvinyl alcohol (LIQUIFILM TEARS) 1.4 % ophthalmic solution Place 1 drop into both eyes 4 (four) times daily.   Yes [provider]  potassium chloride (K-DUR) 10 MEQ tablet Take 1 tablet (10 mEq  total) by mouth daily. 08/21/16  Yes Jettie Booze, MD  PROAIR HFA 108 [phone] Base) MCG/ACT inhaler Inhale 2 puffs into the lungs every 4 (four) hours as needed for wheezing or shortness of breath. 06/30/16  Yes Ghimire, Henreitta Leber, MD  sertraline (ZOLOFT) 25 MG tablet Take 25 mg by mouth at bedtime.   Yes [provider]  sertraline (ZOLOFT) 50 MG tablet Take 1 tablet (50 mg total) by mouth daily. 07/28/15  Yes Angiulli, Lavon Paganini, PA-C  spironolactone (ALDACTONE) 25 MG tablet Take 12.5 mg by mouth every Monday, Wednesday, and Friday.  10/09/16  Yes [provider]  levETIRAcetam (KEPPRA) 500 MG tablet Take 1 tablet (500 mg total) by mouth 2 (two) times daily. Patient not taking: Reported on 04/21/2017 12/09/16 04/21/20  Quintella Reichert, MD    Family History Family History  Problem Relation Age of Onset  . Heart failure Father   . Stroke Sister   . Heart attack Neg Hx   . Hypertension Neg Hx     Social  History Social History  Substance Use Topics  . Smoking status: Never Smoker  . Smokeless tobacco: Never Used  . Alcohol use 0.0 oz/week     Comment: 06/28/2016 "moved to assisted living 06/18/2016; nothing since then"     Allergies   Tape and Verapamil   Review of Systems Review of Systems  Constitutional: Positive for activity change and appetite change.  HENT: Negative for congestion and rhinorrhea.   Eyes: Negative for visual disturbance.  Respiratory: Positive for cough, chest tightness and shortness of breath.   Cardiovascular: Negative for chest pain and leg swelling.  Gastrointestinal: Negative for abdominal pain, nausea and vomiting.  Genitourinary: Negative for dysuria, hematuria, vaginal bleeding and vaginal discharge.  Musculoskeletal: Negative for arthralgias and myalgias.  Skin: Negative for wound.  Neurological: Negative for dizziness, weakness, light-headedness and headaches.   all other systems are negative except as noted in the HPI and PMH.     Physical Exam Updated Vital Signs BP (!) 158/107   Pulse (!) 106   Temp 98.9 F (37.2 C) (Oral)   Resp (!) 34   SpO2 99%   Physical Exam  Constitutional: She is oriented to person, place, and time. She appears well-developed. She appears distressed.  Frail appearing Increased work of breathing  HENT:  Head: Normocephalic and atraumatic.  Mouth/Throat: Oropharynx is clear and moist. No oropharyngeal exudate.  Eyes: Pupils are equal, round, and reactive to light. Conjunctivae and EOM are normal.  Neck: Normal range of motion. Neck supple.  No meningismus.  Cardiovascular: Normal rate, regular rhythm, normal heart sounds and intact distal pulses.   No murmur heard. Pulmonary/Chest: Effort normal. No respiratory distress. She has rales. She exhibits no tenderness.  Bibasilar crackles  Abdominal: Soft. There is no tenderness. There is no rebound and no guarding.  Musculoskeletal: Normal range of motion. She  exhibits no edema or tenderness.  Neurological: She is alert and oriented to person, place, and time. No cranial nerve deficit. She exhibits normal muscle tone. Coordination normal.   5/5 strength throughout. CN 2-12 intact.Equal grip strength.   Skin: Skin is warm.  Psychiatric: She has a normal mood and affect. Her behavior is normal.  Nursing note and vitals reviewed.    ED Treatments / Results  Labs (all labs ordered are listed, but only abnormal results are displayed) Labs Reviewed  BASIC METABOLIC PANEL - Abnormal; Notable for the following:       Result Value  Sodium 134 (*)    Calcium 8.4 (*)    GFR calc non Af Amer 53 (*)    All other components within normal limits  TROPONIN I - Abnormal; Notable for the following:    Troponin I 0.04 (*)    All other components within normal limits  BRAIN NATRIURETIC PEPTIDE - Abnormal; Notable for the following:    B Natriuretic Peptide 226.2 (*)    All other components within normal limits  CBC    EKG  EKG Interpretation  Date/Time:  Monday April 21 2017 05:04:18 EDT Ventricular Rate:  111 PR Interval:    QRS Duration: 115 QT Interval:  395 QTC Calculation: 527 R Axis:   -40 Text Interpretation:  Atrial fibrillation Nonspecific IVCD with LAD Probable anteroseptal infarct, recent Artifact Confirmed by Ezequiel Essex [phone]) on 04/21/2017 5:42:38 AM       Radiology Dg Chest Portable 1 View  Result Date: 04/21/2017 CLINICAL DATA:  83 y/o  F; worsening shortness of breath. EXAM: PORTABLE CHEST 1 VIEW COMPARISON:  12/09/2016 chest radiograph. FINDINGS: Stable cardiomegaly given projection and technique. Aortic atherosclerosis with calcification. Reticular markings and peripheral linear opacities compatible with interstitial pulmonary edema. No focal consolidation. Small right pleural effusion. Moderate dextrocurvature of the thoracic spine. IMPRESSION: Stable cardiomegaly. Interstitial pulmonary edema. Small right pleural  effusion. Electronically Signed   By: Kristine Garbe M.D.   On: 04/21/2017 06:00    Procedures Procedures (including critical care time)  Medications Ordered in ED Medications  furosemide (LASIX) injection 40 mg (not administered)     Initial Impression / Assessment and Plan / ED Course  I have reviewed the triage vital signs and the nursing notes.  Pertinent labs & imaging results that were available during my care of the patient were reviewed by me and considered in my medical decision making (see chart for details).    Patient from assisted living with difficulty breathing after finding her oxygen tubing kinked.  She remains tachypneic but not hypoxic.  Denies chest pain.  Denies cough or fever.  History of COPD as well as CHF.  EKG is unchanged but artifactual  Patient given IV Lasix.  Chest x-ray consistent with pulmonary edema  Labs show stable creatinine.  Minimal troponin elevation similar to previous  Patient and daughter at bedside confirmed DNR/DNI.  She feels much more comfortable on BiPAP.  Plan to admit for diuresis Discussed with NP Lissa Merlin.  CRITICAL CARE Performed by: Ezequiel Essex Total critical care time: 35 minutes Critical care time was exclusive of separately billable procedures and treating other patients. Critical care was necessary to treat or prevent imminent or life-threatening deterioration. Critical care was time spent personally by me on the following activities: development of treatment plan with patient and/or surrogate as well as nursing, discussions with consultants, evaluation of patient's response to treatment, examination of patient, obtaining history from patient or surrogate, ordering and performing treatments and interventions, ordering and review of laboratory studies, ordering and review of radiographic studies, pulse oximetry and re-evaluation of patient's condition.  Final Clinical Impressions(s) / ED Diagnoses   Final  diagnoses:  Acute on chronic systolic congestive heart failure [facility])    New Prescriptions New Prescriptions   No medications on file     Ezequiel Essex, MD 04/21/17 [phone]

## 2017-04-21 NOTE — Progress Notes (Signed)
Pt c/o lower abdominal pain.  Urinated a large amount of urine.  Idolina Primer, RN

## 2017-04-21 NOTE — ED Notes (Signed)
Unsuccessful attempt at giving report to [place].

## 2017-04-21 NOTE — Progress Notes (Signed)
Called to room to assess pt. MD arrived at bedside. Placed on BIPAP per V/O. 12/5 30% FiO2.

## 2017-04-21 NOTE — Progress Notes (Signed)
Stable off BiPAP so we will change bed status to telemetry.  Erin Hearing, ANP

## 2017-04-21 NOTE — ED Notes (Signed)
Daughter reports that PT has a MOST [facility]. Daughter will bring it in later this afternoon.

## 2017-04-21 NOTE — ED Triage Notes (Signed)
[facility] up with difficulty breathing and anxiety. First responders noted that patients oxygen tubing was kinked. Tubing was adjusted  Pt continued to feel short of breath and was brought into the ER for evaluation

## 2017-04-21 NOTE — H&P (Signed)
History and Physical    NAKIAH Myers WVP:[id number] DOB: 19-Jun-1924 DOA: 04/21/2017   PCP: Orvis Brill, [facility] /UNASSIGNED  Attending physician: Shanon Brow  Patient coming from/Resides with: ALF/[facility]  Chief Complaint: Shortness of breath and anxiety  HPI: Caroline Myers is a 83 y.o. female with medical history significant for chronic atrial fibrillation on Eliquis, history of CVA x2, chronic combined systolic/diastolic heart failure in the setting of severe aortic stenosis and severe pulmonary hypertension, COPD, anxiety, chronic hypoxemia on nasal cannula oxygen.  Patient previously on hospice services secondary to her heart failure but was disqualified after apparently stabilizing with her heart failure symptoms.  Previously considered for TAVR but decided against this procedure.  She has been in her usual state of health with chronic dyspnea on exertion and requirement for wheelchair for mobilization.  She awakened during the night with shortness of breath.  EMS was called to facility and upon initial evaluation of patient noted her oxygen tubing was kinked-tubing was adjusted but this did not improve patient's symptoms.  In the ER chest x-ray consistent with heart failure/pulmonary edema.  She has been given IV Lasix.  Breathing has improved on BiPAP.  She has had increased urinary output after Lasix.  Of note over the weekend she had corn beef sandwich with cheese and sauerkraut.  Patient has chronic mild elevation in troponin.  ED Course:  Vital Signs: BP 137/85   Pulse 84   Temp 98.9 F (37.2 C) (Oral)   Resp (!) 24   SpO2 98%  PCXR: Interstitial pulmonary edema with small right pleural effusion Lab data: Sodium 134, potassium 4.4, chloride 102, CO2 27, glucose 95, BUN 13, creatinine 0.92, BNP 226, troponin 0 0.04, white count 8400, differential not obtained, hemoglobin 13.1, platelets 279,000  Medications and treatments: Lasix 40 mg IV x1, DuoNeb x1  Review of  Systems:  In addition to the HPI above,  No Fever-chills, myalgias or other constitutional symptoms No Headache, changes with Vision or hearing, new weakness, tingling, numbness in any extremity, dizziness, dysarthria or word finding difficulty, gait disturbance or imbalance, tremors or seizure activity No problems swallowing food or Liquids, indigestion/reflux, choking or coughing while eating, abdominal pain with or after eating No Chest pain, Cough, palpitations No Abdominal pain, N/V, melena,hematochezia, dark tarry stools, constipation No dysuria, malodorous urine, hematuria or flank pain No new skin rashes, lesions, masses or bruises, No new joint pains, aches, swelling or redness No recent unintentional weight gain or loss No polyuria, polydypsia or polyphagia   Past Medical History:  Diagnosis Date  . Allergic rhinitis   . Anxiety   . Arthritis    "neck, spine, hands" (06/28/2016)  . Asthma 2000   mild. tried Advair after bronchospasm after exposure to cats  . Atrial flutter ([place])   . Chronic combined systolic and diastolic CHF (congestive heart failure) ([place])   . COPD (chronic obstructive pulmonary disease) ([place])   . GERD (gastroesophageal reflux disease)    occ  . Intracranial hemorrhage following injury ([place]) 01/03/2015  . Normal coronary arteries    a. underwent treadmill stress test in April 2008 which shows normal perfusion study, however she had chest pain and EKG changes during the terminal portion concerning for false negative study. She had normal coronaries on cardiac catheterization in May 2008.   Marland Kitchen Permanent atrial fibrillation (HCC)    pt does not want anticoagulation  . Pneumonia 07/2015  . Scoliosis   . Severe aortic stenosis   . Shoulder pain,  left 05/2010   with rotator cuff tear--Dr Alfonso Ramus  . Skin cancer of nose    "MOHS; think it was basal"  . Squamous carcinoma    "burned off my face"  . Stroke [facility]) 1/ 2014; 12/2014   denies residual on  06/28/2016    Past Surgical History:  Procedure Laterality Date  . CATARACT EXTRACTION W/ INTRAOCULAR LENS  IMPLANT, BILATERAL Bilateral   . DILATION AND CURETTAGE OF UTERUS    . HERNIA REPAIR  2003  . LUMBAR LAMINECTOMY/DECOMPRESSION MICRODISCECTOMY  04/01/2012   Procedure: LUMBAR LAMINECTOMY/DECOMPRESSION MICRODISCECTOMY 1 LEVEL;  Surgeon: Eustace Moore, MD;  Location: [facility];  Service: Neurosurgery;  Laterality: Right;  Right Lumbar four-five extraforaminal microdiskectomy  . MOHS SURGERY     "nose"  . SHOULDER ARTHROSCOPY Right 2000   ACROMIOPLASTY  . TONSILLECTOMY      Social History   Social History  . Marital status: Widowed    Spouse name: Herbie Baltimore  . Number of children: 2  . Years of education: 12th   Occupational History  . RETIRED homemaker Retired   Social History Main Topics  . Smoking status: Never Smoker  . Smokeless tobacco: Never Used  . Alcohol use 0.0 oz/week     Comment: 06/28/2016 "moved to assisted living 06/18/2016; nothing since then"  . Drug use: No  . Sexual activity: No   Other Topics Concern  . Not on file   Social History Narrative   Patient is married with 2 children.   Patient is right handed.   Patient has hs education.   Patient drinks 1 cup daily.    Mobility: Wheelchair dependent secondary to chronic dyspnea and need to mobilize with oxygen Work history: Not obtained   Allergies  Allergen Reactions  . Tape Other (See Comments)    Rips skin-paper tape  . Verapamil Other (See Comments)    Per MAR    Family History  Problem Relation Age of Onset  . Heart failure Father   . Stroke Sister   . Heart attack Neg Hx   . Hypertension Neg Hx      Prior to Admission medications   Medication Sig Start Date End Date Taking? Authorizing Provider  acetaminophen (TYLENOL) 325 MG tablet Take 650 mg by mouth every 4 (four) hours as needed for mild pain or fever.   Yes [provider]  acetaminophen (TYLENOL) 650 MG  suppository Place 650 mg rectally every 4 (four) hours as needed for mild pain or fever.   Yes [provider]  ALPRAZolam (XANAX) 0.25 MG tablet Take 0.25 mg by mouth 2 (two) times daily.    Yes [provider]  apixaban (ELIQUIS) 2.5 MG TABS tablet Take 2.5 mg by mouth 2 (two) times daily.   Yes [provider]  azelastine (OPTIVAR) 0.05 % ophthalmic solution Place 1 drop into the left eye daily as needed.  11/15/16  Yes [provider]  furosemide (LASIX) 20 MG tablet Take 0.5 tablets (10 mg total) by mouth daily. 12/15/16  Yes Verner Mould, MD  ipratropium-albuterol (DUONEB) 0.5-2.5 (3) MG/3ML SOLN Take 3 mLs by nebulization 3 (three) times daily.  11/14/16  Yes [provider]  ipratropium-albuterol (DUONEB) 0.5-2.5 (3) MG/3ML SOLN Take 3 mLs by nebulization every 4 (four) hours as needed (for shortness of breath).   Yes [provider]  oxymetazoline (AFRIN) 0.05 % nasal spray Place 2 sprays into both nostrils 2 (two) times daily as needed for congestion.  Yes [provider]  polyvinyl alcohol (LIQUIFILM TEARS) 1.4 % ophthalmic solution Place 1 drop into both eyes 4 (four) times daily.   Yes [provider]  potassium chloride (K-DUR) 10 MEQ tablet Take 1 tablet (10 mEq total) by mouth daily. 08/21/16  Yes Jettie Booze, MD  PROAIR HFA 108 [phone] Base) MCG/ACT inhaler Inhale 2 puffs into the lungs every 4 (four) hours as needed for wheezing or shortness of breath. 06/30/16  Yes Ghimire, Henreitta Leber, MD  sertraline (ZOLOFT) 25 MG tablet Take 25 mg by mouth at bedtime.   Yes [provider]  sertraline (ZOLOFT) 50 MG tablet Take 1 tablet (50 mg total) by mouth daily. 07/28/15  Yes Angiulli, Lavon Paganini, PA-C  spironolactone (ALDACTONE) 25 MG tablet Take 12.5 mg by mouth every Monday, Wednesday, and Friday.  10/09/16  Yes [provider]  levETIRAcetam (KEPPRA) 500 MG tablet Take 1 tablet (500 mg total)  by mouth 2 (two) times daily. Patient not taking: Reported on 04/21/2017 12/09/16 04/21/20  Quintella Reichert, MD    Physical Exam: Vitals:   04/21/17 0537 04/21/17 0630 04/21/17 0700 04/21/17 0748  BP: (!) 158/107 (!) 129/97 137/85   Pulse:  92 84   Resp:  (!) 27 (!) 24   Temp:      TempSrc:      SpO2: 99% 98% 96% 98%      Constitutional: NAD, calm, comfortable Eyes: PERRL, bilateral ectropion with mild scleral injection and reddened conjunctiva ENMT: Mucous membranes are moist. Posterior pharynx clear of any exudate or lesions.Normal dentition.  Neck: normal, supple, no masses, no thyromegaly Respiratory: Fine expiratory crackles normal respiratory effort doubt accessory muscle use.  Pap Cardiovascular: Regular rate and rhythm, no rubs / gallops.  Grade 3/6 systolic murmur right sternal border second intercostal space, no extremity edema. 2+ pedal pulses. No carotid bruits.  Abdomen: no tenderness, no masses palpated. No hepatosplenomegaly. Bowel sounds positive.  Musculoskeletal: no clubbing / cyanosis. No joint deformity upper and lower extremities. Good ROM, no contractures. Normal muscle tone. Genitourinary: PureWick urinary collection device in place with yellow urine noted in suction canister Skin: no rashes, lesions, ulcers. No induration Neurologic: CN 2-12 grossly intact. Sensation intact, DTR normal. Strength 5/5 x all 4 extremities.  Psychiatric: Normal judgment and insight. Alert and oriented x 3. Normal mood.    Labs on Admission: I have personally reviewed following labs and imaging studies  CBC:  Recent Labs Lab 04/21/17 0530  WBC 8.4  HGB 13.1  HCT 40.8  MCV 99.0  PLT 546   Basic Metabolic Panel:  Recent Labs Lab 04/21/17 0530  NA 134*  K 4.4  CL 102  CO2 27  GLUCOSE 95  BUN 13  CREATININE 0.92  CALCIUM 8.4*   GFR: CrCl cannot be calculated (Unknown ideal weight.). Liver Function Tests: No results for input(s): AST, ALT, ALKPHOS, BILITOT,  PROT, ALBUMIN in the last 168 hours. No results for input(s): LIPASE, AMYLASE in the last 168 hours. No results for input(s): AMMONIA in the last 168 hours. Coagulation Profile: No results for input(s): INR, PROTIME in the last 168 hours. Cardiac Enzymes:  Recent Labs Lab 04/21/17 0530  TROPONINI 0.04*   BNP (last 3 results) No results for input(s): PROBNP in the last 8760 hours. HbA1C: No results for input(s): HGBA1C in the last 72 hours. CBG: No results for input(s): GLUCAP in the last 168 hours. Lipid Profile: No results for input(s): CHOL, HDL, LDLCALC, TRIG, CHOLHDL, LDLDIRECT in the  last 72 hours. Thyroid Function Tests: No results for input(s): TSH, T4TOTAL, FREET4, T3FREE, THYROIDAB in the last 72 hours. Anemia Panel: No results for input(s): VITAMINB12, FOLATE, FERRITIN, TIBC, IRON, RETICCTPCT in the last 72 hours. Urine analysis:    Component Value Date/Time   COLORURINE YELLOW 07/06/2016 1752   APPEARANCEUR CLEAR 07/06/2016 1752   LABSPEC 1.020 07/06/2016 1752   PHURINE 6.0 07/06/2016 1752   GLUCOSEU NEGATIVE 07/06/2016 1752   GLUCOSEU NEG mg/dL 10/09/2009 2126   HGBUR NEGATIVE 07/06/2016 1752   BILIRUBINUR NEGATIVE 07/06/2016 1752   KETONESUR NEGATIVE 07/06/2016 1752   PROTEINUR 30 (A) 07/06/2016 1752   UROBILINOGEN 0.2 02/14/2015 0435   NITRITE NEGATIVE 07/06/2016 1752   LEUKOCYTESUR TRACE (A) 07/06/2016 1752   Sepsis Labs: @LABRCNTIP (procalcitonin:4,lacticidven:4) )No results found for this or any previous visit (from the past 240 hour(s)).   Radiological Exams on Admission: Dg Chest Portable 1 View  Result Date: 04/21/2017 CLINICAL DATA:  83 y/o  F; worsening shortness of breath. EXAM: PORTABLE CHEST 1 VIEW COMPARISON:  12/09/2016 chest radiograph. FINDINGS: Stable cardiomegaly given projection and technique. Aortic atherosclerosis with calcification. Reticular markings and peripheral linear opacities compatible with interstitial pulmonary edema. No  focal consolidation. Small right pleural effusion. Moderate dextrocurvature of the thoracic spine. IMPRESSION: Stable cardiomegaly. Interstitial pulmonary edema. Small right pleural effusion. Electronically Signed   By: Kristine Garbe M.D.   On: 04/21/2017 06:00    EKG: (Independently reviewed) atrial fibrillation/?  Flutter with unifocal PVC, ventricular rate 111 bpm, QTC 527 ms in setting of nonspecific IVCD, no definitive acute ischemic changes but significant baseline artifact in setting of increased work of breathing/respiratory effort when EKG was obtained at presentation  Assessment/Plan Principal Problem:   Acute on chronic systolic heart failure, NYHA class 3/Severe aortic valve stenosis/Moderate to severe pulmonary hypertension  -Patient presents with acute heart failure exacerbation in the setting of known severe end-stage heart failure -She has opted not to proceed with TAVR -Continue supportive care with oxygen (see below) -Lasix 40 mg IV every 12 hours x2 more doses then reevaluate if appropriate to transition back to home dosage of oral Lasix -Hold Aldactone during acute diuresis -Was not on ACE inhibitor or beta-blocker prior to admission -Daily weights, strict I/O -Last echocardiogram March 2018: Severe LVH, EF 35-40%, diffuse hypokinesis, severe aortic stenosis with a valve area of 0.92 cm^2, pulmonary hypertension 59 mmHg -Daughter interested in resuming hospice care if eligible-SW/CM consulted  Active Problems:   Acute on chronic respiratory failure with hypoxia  -On chronic nasal cannula oxygen at home -Wean BiPAP as tolerated    Chronic atrial fibrillation  -Currently rate controlled and not on AVN blocking agents -Continue Eliquis -CHADsVASc=6    COPD (chronic obstructive pulmonary disease)  -Continue preadmission nebs and MDI -Oxygen as above    Anxiety disorder -Continue preadmission Zoloft and Xanax      DVT prophylaxis: Eliquis Code Status:  DNR Family Communication: Daughter Disposition Plan: ALF vs SNF ending PT/OT evaluation Consults called: None    ELLIS,ALLISON L. ANP-BC [facility] Pager [phone]   If 7PM-7AM, please contact night-coverage [email or web address] Password [facility]  04/21/2017, 7:53 AM

## 2017-04-22 LAB — BASIC METABOLIC PANEL
ANION GAP: 11 (ref 5–15)
BUN: 11 mg/dL (ref 6–20)
CO2: 30 mmol/L (ref 22–32)
Calcium: 8.4 mg/dL — ABNORMAL LOW (ref 8.9–10.3)
Chloride: 93 mmol/L — ABNORMAL LOW (ref 101–111)
Creatinine, Ser: 0.89 mg/dL (ref 0.44–1.00)
GFR calc non Af Amer: 55 mL/min — ABNORMAL LOW (ref >60)
GLUCOSE: 97 mg/dL (ref 65–99)
Potassium: 3.6 mmol/L (ref 3.5–5.1)
Sodium: 134 mmol/L — ABNORMAL LOW (ref 135–145)

## 2017-04-22 LAB — GLUCOSE, CAPILLARY: Glucose-Capillary: 110 mg/dL — ABNORMAL HIGH (ref 65–99)

## 2017-04-22 MED ORDER — ORAL CARE MOUTH RINSE
15.0000 mL | Freq: Two times a day (BID) | OROMUCOSAL | Status: DC
  Administered 2017-04-22 – 2017-04-25 (×5): 15 mL via OROMUCOSAL

## 2017-04-22 MED ORDER — FUROSEMIDE 10 MG/ML IJ SOLN
40.0000 mg | Freq: Two times a day (BID) | INTRAMUSCULAR | Status: DC
  Administered 2017-04-22 – 2017-04-23 (×2): 40 mg via INTRAVENOUS
  Filled 2017-04-22 (×2): qty 4

## 2017-04-22 NOTE — Evaluation (Signed)
Physical Therapy Evaluation Patient Details Name: Caroline Myers MRN: [id number] DOB: 06/26/1924 Today's Date: 04/22/2017   History of Present Illness  Patient is a 83 y/o female who presents with SOB. CXR-consistent with heart failure/pulmonary edema. PMH includes PAF, CVA, ICH, back surgery, chronic combined systolic/diastolic heart failure in the setting of severe aortic stenosis and severe pulmonary hypertension, A-fib.  Clinical Impression  Patient presents with dyspnea on exertion, decreased activity tolerance, generalized weakness, impaired balance and impaired mobility s/p above. Tolerated short distance ambulation with Min A for balance/safety. Pt fatigues very quickly with 3-4/4 DOE. Has limited reserve to perform mobility at this time. Pt from ALF and per RN (after talking with family) plans to go on hospice care when she returns. If pt decided against hospice, recommend follow up therapy services to maximize mobility. Pt reports multiple falls as she does not like to use her walker. Education re: using RW for safety, energy conservation etc. Will follow acutely to maximize independence and mobility prior to return home.     Follow Up Recommendations Home health PT;Supervision for mobility/OOB;Supervision/Assistance - 24 hour (return to ALF)    Equipment Recommendations  None recommended by PT    Recommendations for Other Services       Precautions / Restrictions Precautions Precautions: Fall Precaution Comments: watch BP Restrictions Weight Bearing Restrictions: No      Mobility  Bed Mobility Overal bed mobility: Needs Assistance Bed Mobility: Supine to Sit;Sit to Supine     Supine to sit: Min guard;HOB elevated Sit to supine: Min guard;HOB elevated   General bed mobility comments: Increased time to get to EOB with use of rail. ABle to bring LES into bed to return to supine.  Transfers Overall transfer level: Needs assistance Equipment used: Rolling walker (2  wheeled) Transfers: Sit to/from Stand Sit to Stand: Min guard         General transfer comment: Min guard for safety. Stood from [employer], from toilet x1.   Ambulation/Gait Ambulation/Gait assistance: Min assist Ambulation Distance (Feet): 26 Feet (+ 12') Assistive device: Rolling walker (2 wheeled) Gait Pattern/deviations: Step-through pattern;Decreased stride length;Trunk flexed Gait velocity: decreased   General Gait Details: SLow, unsteady gait with constant Min A for support due to LOB x2. 3/4 DOE. Walked with and without RW. Seated rest break on toilet.  Stairs            Wheelchair Mobility    Modified Rankin (Stroke Patients Only)       Balance Overall balance assessment: Needs assistance Sitting-balance support: Feet supported;No upper extremity supported Sitting balance-Leahy Scale: Good Sitting balance - Comments: ABle to perform pericare without difficulty.    Standing balance support: During functional activity;Bilateral upper extremity supported Standing balance-Leahy Scale: Poor Standing balance comment: Reliant on UEs for support in standing.                              Pertinent Vitals/Pain Pain Assessment: Faces Faces Pain Scale: Hurts little more Pain Location: chest/abdomen Pain Descriptors / Indicators: Sore Pain Intervention(s): Monitored during session;Repositioned;Limited activity within patient's tolerance    Home Living Family/patient expects to be discharged to:: Assisted living [facility])               Home Equipment: Gilford Rile - 2 wheels;Cane - single point;Grab bars - tub/shower      Prior Function Level of Independence: Independent with assistive device(s)  Comments: Reports independent with ADLs. I have a walker but do not like to use it. Reports falls.     Hand Dominance   Dominant Hand: Right    Extremity/Trunk Assessment   Upper Extremity Assessment Upper Extremity Assessment: Defer  to OT evaluation    Lower Extremity Assessment Lower Extremity Assessment: Generalized weakness    Cervical / Trunk Assessment Cervical / Trunk Assessment: Kyphotic  Communication   Communication: No difficulties  Cognition Arousal/Alertness: Awake/alert Behavior During Therapy: WFL for tasks assessed/performed Overall Cognitive Status: Within Functional Limits for tasks assessed                                 General Comments: for basic mobility tasks.      General Comments General comments (skin integrity, edema, etc.): BP pre activity 75/57, post activity BP 77/52.    Exercises     Assessment/Plan    PT Assessment Patient needs continued PT services  PT Problem List Decreased strength;Decreased mobility;Decreased safety awareness;Decreased balance;Decreased activity tolerance;Cardiopulmonary status limiting activity;Decreased cognition       PT Treatment Interventions Therapeutic activities;Gait training;Therapeutic exercise;Patient/family education;Balance training;Functional mobility training    PT Goals (Current goals can be found in the Care Plan section)  Acute Rehab PT Goals Patient Stated Goal: to be able to walk more PT Goal Formulation: With patient Time For Goal Achievement: 05/06/17 Potential to Achieve Goals: Fair    Frequency Min 2X/week   Barriers to discharge Decreased caregiver support      Co-evaluation               AM-PAC PT "6 Clicks" Daily Activity  Outcome Measure Difficulty turning over in bed (including adjusting bedclothes, sheets and blankets)?: None Difficulty moving from lying on back to sitting on the side of the bed? : None Difficulty sitting down on and standing up from a chair with arms (e.g., wheelchair, bedside commode, etc,.)?: None Help needed moving to and from a bed to chair (including a wheelchair)?: A Little Help needed walking in hospital room?: A Little Help needed climbing 3-5 steps with a  railing? : A Lot 6 Click Score: 20    End of Session Equipment Utilized During Treatment: Gait belt Activity Tolerance: Patient limited by fatigue;Other (comment) (SOB) Patient left: in bed;with call bell/phone within reach;with bed alarm set Nurse Communication: Mobility status;Other (comment) (wanting breathing treatment) PT Visit Diagnosis: Unsteadiness on feet (R26.81);Muscle weakness (generalized) (M62.81);Other (comment) (DOE)    Time: 1100-1130 PT Time Calculation (min) (ACUTE ONLY): 30 min   Charges:   PT Evaluation $PT Eval Moderate Complexity: 1 Mod PT Treatments $Gait Training: 8-22 mins   PT G Codes:        Wray Kearns, PT, DPT [phone]    Marguarite Arbour A [place] 04/22/2017, 12:22 PM

## 2017-04-22 NOTE — Care Management Note (Addendum)
Case Management Note  Patient Details  Name: Caroline Myers MRN: [id number] Date of Birth: [birth year]  Subjective/Objective:  Pt presented for for SOB and Anxiety. Treating for Acute on Chronic CHF. Pt is from [name]. Pt has support of two daughters.                   Action/Plan: Daughter [name] had questions in regards to if pt is eligible for Hospice Services at the ALF. Daughter asked if a Palliative Consult could be placed. Pt was active with [facility] from April 11- March 27 2017. Daughter wanted to see is she meets criteria for services this admission. CM did reach out to MD in regards to Palliative Consult and he states that  pt will not meet Criteria for Hospice Services at this time. CM will reach out to [facility] to see if they are contracted with certain agencies for [name].     Expected Discharge Date:                  Expected Discharge Plan:  Assisted Living / Rest Home  In-House Referral:  Clinical Social Work  Discharge planning Services  CM Consult  Post Acute Care Choice:   Home Health  Choice offered to:   Patient; Adult Children  DME Arranged:   N/A DME Agency:   N/A  HH Arranged:   PT HH Agency:   [facility]  Status of Service:  Completed.  If discussed at [place] of Stay Meetings, dates discussed:    Additional Comments: 1049 04-25-17 Jacqlyn Krauss, RN,BSN [phone] CM did speak with patient this am and pt answered questions appropriately. Per pt she uses a wheelchair at the [facility] and she has continuous Oxygen. Pt will need HHPT and F2F orders. CM did ask patient if ok to reach out to daughter [name] and she stated that was more than ok. CM did call [name] in regards to HHPT and choice of Agency.  [name] wanted to use [facility] and [facility] with them. Liaison [facility] with [facility] is aware. SOC to begin within 24- 48 hours of d/c. CSW aware that pt wants to use ambulance transport for home.   No further needs from this CM.  Bethena Roys, RN 04/22/2017, 12:52 PM

## 2017-04-22 NOTE — Progress Notes (Signed)
Pt had a 19 beat run of V Tach. MD notified. Pt asymptomatic, vital signs stable. Orders received for STAT BMET and [place] labs. K+:3.7, Mag: 1.9. Will continue to monitor.

## 2017-04-22 NOTE — Progress Notes (Signed)
PROGRESS NOTE    Caroline Myers  XBJ:[id number] DOB: August 24, 1923 DOA: 04/21/2017 PCP: [facility]   Brief Narrative:  83 y.o. female with medical history significant for chronic atrial fibrillation on Eliquis, history of CVA x2, chronic combined systolic/diastolic heart failure in the setting of severe aortic stenosis and severe pulmonary hypertension, COPD, anxiety, chronic hypoxemia on nasal cannula oxygen.  Patient previously on hospice services secondary to her heart failure but was disqualified after apparently stabilizing with her heart failure symptoms.    Assessment & Plan:   Principal Problem:   Acute on chronic systolic heart failure, NYHA class 3 (HCC) - We'll obtain echocardiogram to assess and see if CHF has worsened. Daughter is interested in trying to get patient into hospice care again - Continue lasix 40 mg IV bid  Active Problems:   Severe aortic valve stenosis   Chronic respiratory failure with hypoxia (HCC)   Chronic atrial fibrillation (HCC) - continue patient on eliquis    Moderate to severe pulmonary hypertension ([place]) - may be contributing to principle problem    COPD (chronic obstructive pulmonary disease) (HCC) - stable currently, continue duoneb - albuterol prn   DVT prophylaxis: Eliquis Code Status: DNR Family Communication: none at bedside. Disposition Plan:  Pending improvement in condition, will obtain echocardiogram for disposition recommendations moving forward.   Consultants:   None   Procedures: None   Antimicrobials: None   Subjective: Pt has no new complaints.  Objective: Vitals:   04/22/17 0914 04/22/17 0930 04/22/17 1339 04/22/17 1425  BP:    96/83  Pulse:      Resp:      Temp:    97.8 F (36.6 C)  TempSrc:    Oral  SpO2: 100% 95% 97% 99%  Weight:        Intake/Output Summary (Last 24 hours) at 04/22/17 1538 Last data filed at 04/22/17 0930  Gross per 24 hour  Intake              123 ml    Output                1 ml  Net              122 ml   Filed Weights   04/21/17 1346 04/22/17 0500  Weight: 54.4 kg (120 lb) 52.6 kg (115 lb 14.4 oz)    Examination:  General exam: Appears calm and comfortable, in nad. Respiratory system: prolonged exp phase, equal chest rise, mild wheezes on expiration. Cardiovascular system: S1 & S2 heard, RRR. No JVD, murmurs, rubs Gastrointestinal system: Abdomen is nondistended, soft and nontender. No organomegaly or masses felt.  Central nervous system: Alert and oriented. No focal neurological deficits. Extremities: Symmetric 5 x 5 power. Skin: No rashes, lesions or ulcers, on limited exam. Psychiatry:  Mood & affect appropriate.     Data Reviewed: I have personally reviewed following labs and imaging studies  CBC:  Recent Labs Lab 04/21/17 0530  WBC 8.4  HGB 13.1  HCT 40.8  MCV 99.0  PLT 308   Basic Metabolic Panel:  Recent Labs Lab 04/21/17 0530 04/21/17 2020 04/22/17 0403  NA 134* 134* 134*  K 4.4 3.7 3.6  CL 102 94* 93*  CO2 27 31 30   GLUCOSE 95 95 97  BUN 13 13 11   CREATININE 0.92 0.78 0.89  CALCIUM 8.4* 8.4* 8.4*  MG  --  1.9  --    GFR: Estimated Creatinine Clearance: 34.9 mL/min (by C-G  formula based on SCr of 0.89 mg/dL). Liver Function Tests: No results for input(s): AST, ALT, ALKPHOS, BILITOT, PROT, ALBUMIN in the last 168 hours. No results for input(s): LIPASE, AMYLASE in the last 168 hours. No results for input(s): AMMONIA in the last 168 hours. Coagulation Profile: No results for input(s): INR, PROTIME in the last 168 hours. Cardiac Enzymes:  Recent Labs Lab 04/21/17 0530  TROPONINI 0.04*   BNP (last 3 results) No results for input(s): PROBNP in the last 8760 hours. HbA1C: No results for input(s): HGBA1C in the last 72 hours. CBG: No results for input(s): GLUCAP in the last 168 hours. Lipid Profile: No results for input(s): CHOL, HDL, LDLCALC, TRIG, CHOLHDL, LDLDIRECT in the last 72  hours. Thyroid Function Tests: No results for input(s): TSH, T4TOTAL, FREET4, T3FREE, THYROIDAB in the last 72 hours. Anemia Panel: No results for input(s): VITAMINB12, FOLATE, FERRITIN, TIBC, IRON, RETICCTPCT in the last 72 hours. Sepsis Labs: No results for input(s): PROCALCITON, LATICACIDVEN in the last 168 hours.  No results found for this or any previous visit (from the past 240 hour(s)).       Radiology Studies: Dg Chest Portable 1 View  Result Date: 04/21/2017 CLINICAL DATA:  83 y/o  F; worsening shortness of breath. EXAM: PORTABLE CHEST 1 VIEW COMPARISON:  12/09/2016 chest radiograph. FINDINGS: Stable cardiomegaly given projection and technique. Aortic atherosclerosis with calcification. Reticular markings and peripheral linear opacities compatible with interstitial pulmonary edema. No focal consolidation. Small right pleural effusion. Moderate dextrocurvature of the thoracic spine. IMPRESSION: Stable cardiomegaly. Interstitial pulmonary edema. Small right pleural effusion. Electronically Signed   By: Kristine Garbe M.D.   On: 04/21/2017 06:00        Scheduled Meds: . ALPRAZolam  0.25 mg Oral BID  . apixaban  2.5 mg Oral BID  . ipratropium-albuterol  3 mL Nebulization TID  . ketotifen  1 drop Left Eye BID  . mouth rinse  15 mL Mouth Rinse BID  . polyvinyl alcohol  1 drop Both Eyes QID  . potassium chloride  10 mEq Oral Daily  . sertraline  25 mg Oral QHS  . sodium chloride flush  3 mL Intravenous Q12H   Continuous Infusions: . sodium chloride       LOS: 1 day    Time spent: > 35 minutes   Velvet Bathe, MD [facility] Pager [phone]  If 7PM-7AM, please contact night-coverage [email or web address] Password [facility] 04/22/2017, 3:38 PM

## 2017-04-23 ENCOUNTER — Inpatient Hospital Stay (HOSPITAL_COMMUNITY): Payer: Medicare Other

## 2017-04-23 DIAGNOSIS — I340 Nonrheumatic mitral (valve) insufficiency: Secondary | ICD-10-CM

## 2017-04-23 DIAGNOSIS — I351 Nonrheumatic aortic (valve) insufficiency: Secondary | ICD-10-CM

## 2017-04-23 LAB — BASIC METABOLIC PANEL
ANION GAP: 9 (ref 5–15)
BUN: 24 mg/dL — ABNORMAL HIGH (ref 6–20)
CALCIUM: 8.5 mg/dL — AB (ref 8.9–10.3)
CO2: 32 mmol/L (ref 22–32)
Chloride: 94 mmol/L — ABNORMAL LOW (ref 101–111)
Creatinine, Ser: 1.17 mg/dL — ABNORMAL HIGH (ref 0.44–1.00)
GFR, EST AFRICAN AMERICAN: 46 mL/min — AB (ref >60)
GFR, EST NON AFRICAN AMERICAN: 40 mL/min — AB (ref >60)
GLUCOSE: 105 mg/dL — AB (ref 65–99)
Potassium: 3.3 mmol/L — ABNORMAL LOW (ref 3.5–5.1)
SODIUM: 135 mmol/L (ref 135–145)

## 2017-04-23 LAB — ECHOCARDIOGRAM COMPLETE: WEIGHTICAEL: 1848 [oz_av]

## 2017-04-23 MED ORDER — POTASSIUM CHLORIDE CRYS ER 20 MEQ PO TBCR
40.0000 meq | EXTENDED_RELEASE_TABLET | Freq: Once | ORAL | Status: AC
  Administered 2017-04-23: 40 meq via ORAL
  Filled 2017-04-23: qty 2

## 2017-04-23 MED ORDER — FUROSEMIDE 20 MG PO TABS
20.0000 mg | ORAL_TABLET | Freq: Every day | ORAL | Status: DC
  Administered 2017-04-23 – 2017-04-25 (×3): 20 mg via ORAL
  Filled 2017-04-23 (×3): qty 1

## 2017-04-23 NOTE — Evaluation (Signed)
Occupational Therapy Evaluation Patient Details Name: Caroline Myers MRN: [id number] DOB: Nov 17, 1923 Today's Date: 04/23/2017    History of Present Illness Patient is a 83 y/o female who presents with SOB. CXR-consistent with heart failure/pulmonary edema. PMH includes PAF, CVA, ICH, back surgery, chronic combined systolic/diastolic heart failure in the setting of severe aortic stenosis and severe pulmonary hypertension, A-fib.   Clinical Impression   Pt reports she pushes a w/c to walk about the ALF and is independent in sponge bathing, grooming, dressing and feeding. The staff at the ALF supervise showering and provide meals and housekeeping. Pt presents with decreased activity tolerance, impaired balance and generalized weakness interfering with ability to perform ADL and ADL transfers. Will follow.    Follow Up Recommendations  Home health OT;Supervision/Assistance - 24 hour (at ALF)    Equipment Recommendations  None recommended by OT    Recommendations for Other Services       Precautions / Restrictions Precautions Precautions: Fall Restrictions Weight Bearing Restrictions: No      Mobility Bed Mobility Overal bed mobility: Needs Assistance Bed Mobility: Supine to Sit     Supine to sit: Supervision;HOB elevated     General bed mobility comments: increased time, use of rail  Transfers Overall transfer level: Needs assistance Equipment used: Rolling walker (2 wheeled) Transfers: Sit to/from Stand Sit to Stand: Min guard         General transfer comment: cues for hand placement, min guard fro safety, moves very slowly    Balance Overall balance assessment: Needs assistance Sitting-balance support: Feet supported;No upper extremity supported Sitting balance-Leahy Scale: Good       Standing balance-Leahy Scale: Poor Standing balance comment: requires steadying assist as she manages her depends in standing                           ADL  either performed or assessed with clinical judgement   ADL Overall ADL's : Needs assistance/impaired Eating/Feeding: Independent;Bed level   Grooming: Wash/dry hands;Wash/dry face;Brushing hair;Sitting;Set up   Upper Body Bathing: Sitting;Supervision/ safety   Lower Body Bathing: Minimal assistance;Sit to/from stand   Upper Body Dressing : Set up;Sitting   Lower Body Dressing: Minimal assistance;Sit to/from stand   Toilet Transfer: Minimal assistance;Stand-pivot;BSC;RW   Toileting- Clothing Manipulation and Hygiene: Minimal assistance;Sit to/from stand               Vision Baseline Vision/History: Wears glasses Wears Glasses: Reading only Patient Visual Report: Blurring of vision       Perception     Praxis      Pertinent Vitals/Pain Pain Assessment: No/denies pain     Hand Dominance Right   Extremity/Trunk Assessment Upper Extremity Assessment Upper Extremity Assessment: Generalized weakness (hx of clavicle fx)   Lower Extremity Assessment Lower Extremity Assessment: Defer to PT evaluation   Cervical / Trunk Assessment Cervical / Trunk Assessment: Kyphotic   Communication Communication Communication: No difficulties   Cognition Arousal/Alertness: Awake/alert Behavior During Therapy: WFL for tasks assessed/performed Overall Cognitive Status: Impaired/Different from baseline Area of Impairment: Problem solving                             Problem Solving: Slow processing;Decreased initiation;Difficulty sequencing;Requires verbal cues;Requires tactile cues     General Comments       Exercises     Shoulder Instructions      Home Living Family/patient expects to be discharged  to:: Assisted living [facility])                             Home Equipment: Cane - single point;Grab bars - tub/shower;Wheelchair - manual          Prior Functioning/Environment Level of Independence: Needs assistance  Gait / Transfers  Assistance Needed: pushes w/c to ambulate ADL's / Homemaking Assistance Needed: assisted for showering, meals, housekeeping            OT Problem List: Decreased activity tolerance;Impaired balance (sitting and/or standing);Decreased strength;Decreased cognition;Decreased safety awareness;Decreased knowledge of use of DME or AE;Cardiopulmonary status limiting activity      OT Treatment/Interventions: Self-care/ADL training;DME and/or AE instruction;Patient/family education;Balance training;Therapeutic activities;Energy conservation    OT Goals(Current goals can be found in the care plan section) Acute Rehab OT Goals Patient Stated Goal: to be able to go out with her friends and daughter OT Goal Formulation: With patient Time For Goal Achievement: 2017-05-04 Potential to Achieve Goals: Good ADL Goals Pt Will Perform Grooming: with supervision;standing Pt Will Perform Upper Body Bathing: with supervision;sitting Pt Will Perform Lower Body Bathing: with supervision;sit to/from stand Pt Will Perform Upper Body Dressing: with supervision;sitting Pt Will Transfer to Toilet: with supervision;ambulating Pt Will Perform Toileting - Clothing Manipulation and hygiene: with supervision;sit to/from stand Pt Will Perform Tub/Shower Transfer: Shower transfer;with supervision;ambulating;shower seat Additional ADL Goal #1: Pt will utilize energy conservation strategies in ADL and mobility with minima verbal cues.  OT Frequency: Min 2X/week   Barriers to D/C:            Co-evaluation              AM-PAC PT "6 Clicks" Daily Activity     Outcome Measure Help from another person eating meals?: None   Help from another person toileting, which includes using toliet, bedpan, or urinal?: A Little Help from another person bathing (including washing, rinsing, drying)?: A Little Help from another person to put on and taking off regular upper body clothing?: A Little Help from another person to put  on and taking off regular lower body clothing?: A Lot 6 Click Score: 15   End of Session Equipment Utilized During Treatment: Gait belt;Rolling walker;Oxygen Nurse Communication: Mobility status  Activity Tolerance: Patient limited by fatigue Patient left: in chair;with call bell/phone within reach;with chair alarm set;with nursing/sitter in room  OT Visit Diagnosis: Unsteadiness on feet (R26.81);Other abnormalities of gait and mobility (R26.89);History of falling (Z91.81);Muscle weakness (generalized) (M62.81);Cognitive communication deficit (R41.841)                Time: 1030-1054 OT Time Calculation (min): 24 min Charges:  OT General Charges $OT Visit: 1 Visit OT Evaluation $OT Eval Moderate Complexity: 1 Mod G-Codes:     05-04-2017 Nestor Lewandowsky, OTR/L Pager: [phone]  Werner Lean, Haze Boyden 05/04/17, 11:09 AM

## 2017-04-23 NOTE — Progress Notes (Signed)
PROGRESS NOTE    Caroline Myers  JTT:[id number] DOB: March 06, 1924 DOA: 04/21/2017 PCP: [facility]   Brief Narrative:  83 y.o. female with medical history significant for chronic atrial fibrillation on Eliquis, history of CVA x2, chronic combined systolic/diastolic heart failure in the setting of severe aortic stenosis and severe pulmonary hypertension, COPD, anxiety, chronic hypoxemia on nasal cannula oxygen.  Patient previously on hospice services secondary to her heart failure but was disqualified after apparently stabilizing with her heart failure symptoms.    Assessment & Plan:   Principal Problem:   Acute on chronic systolic heart failure, NYHA class 3 (HCC) - We'll obtain echocardiogram to assess and see if CHF has worsened. Daughter is interested in trying to get patient into hospice care again - Continue lasix but orally. Patient had increase in serum creatinine and bun.   Active Problems:   Severe aortic valve stenosis   Chronic respiratory failure with hypoxia (HCC)   Chronic atrial fibrillation (HCC) - continue patient on eliquis  Hypokalemia - will replace and reassess next am. Bmp order placed. - placed on kdur    Moderate to severe pulmonary hypertension ([place]) - may be contributing to principle problem    COPD (chronic obstructive pulmonary disease) (HCC) - stable currently, continue duoneb - albuterol prn   DVT prophylaxis: Eliquis Code Status: DNR Family Communication: none at bedside. Disposition Plan: pending improvement in respiratory condition back to baseline. May d/c next am.   Consultants:   None   Procedures: Echocardiogram   Antimicrobials: None   Subjective: Pt breathing near baseline. Not worse today.  Objective: Vitals:   04/23/17 0520 04/23/17 0817 04/23/17 1100 04/23/17 1633  BP:    (!) 85/62  Pulse:    99  Resp:    20  Temp:    99.6 F (37.6 C)  TempSrc:    Oral  SpO2: 95% 95% 97% 98%  Weight:         Intake/Output Summary (Last 24 hours) at 04/23/17 1704 Last data filed at 04/23/17 1230  Gross per 24 hour  Intake              400 ml  Output              151 ml  Net              249 ml   Filed Weights   04/21/17 1346 04/22/17 0500 04/23/17 0451  Weight: 54.4 kg (120 lb) 52.6 kg (115 lb 14.4 oz) 52.4 kg (115 lb 8 oz)    Examination:  General exam: Appears calm and comfortable, in nad. Respiratory system: prolonged exp phase, equal chest rise, mild wheezes on expiration. Cardiovascular system: S1 & S2 heard, RRR. No JVD, murmurs, rubs Gastrointestinal system: Abdomen is nondistended, soft and nontender. No organomegaly or masses felt.  Central nervous system: Alert and oriented. No focal neurological deficits. Extremities: Symmetric 5 x 5 power. Skin: No rashes, lesions or ulcers, on limited exam. Psychiatry:  Mood & affect appropriate.   Data Reviewed: I have personally reviewed following labs and imaging studies  CBC:  Recent Labs Lab 04/21/17 0530  WBC 8.4  HGB 13.1  HCT 40.8  MCV 99.0  PLT 009   Basic Metabolic Panel:  Recent Labs Lab 04/21/17 0530 04/21/17 2020 04/22/17 0403 04/23/17 0442  NA 134* 134* 134* 135  K 4.4 3.7 3.6 3.3*  CL 102 94* 93* 94*  CO2 27 31 30  32  GLUCOSE 95 95 97  105*  BUN 13 13 11  24*  CREATININE 0.92 0.78 0.89 1.17*  CALCIUM 8.4* 8.4* 8.4* 8.5*  MG  --  1.9  --   --    GFR: Estimated Creatinine Clearance: 26.4 mL/min (A) (by C-G formula based on SCr of 1.17 mg/dL (H)). Liver Function Tests: No results for input(s): AST, ALT, ALKPHOS, BILITOT, PROT, ALBUMIN in the last 168 hours. No results for input(s): LIPASE, AMYLASE in the last 168 hours. No results for input(s): AMMONIA in the last 168 hours. Coagulation Profile: No results for input(s): INR, PROTIME in the last 168 hours. Cardiac Enzymes:  Recent Labs Lab 04/21/17 0530  TROPONINI 0.04*   BNP (last 3 results) No results for input(s): PROBNP in the last 8760  hours. HbA1C: No results for input(s): HGBA1C in the last 72 hours. CBG:  Recent Labs Lab 04/22/17 1632  GLUCAP 110*   Lipid Profile: No results for input(s): CHOL, HDL, LDLCALC, TRIG, CHOLHDL, LDLDIRECT in the last 72 hours. Thyroid Function Tests: No results for input(s): TSH, T4TOTAL, FREET4, T3FREE, THYROIDAB in the last 72 hours. Anemia Panel: No results for input(s): VITAMINB12, FOLATE, FERRITIN, TIBC, IRON, RETICCTPCT in the last 72 hours. Sepsis Labs: No results for input(s): PROCALCITON, LATICACIDVEN in the last 168 hours.  No results found for this or any previous visit (from the past 240 hour(s)).       Radiology Studies: No results found.      Scheduled Meds: . ALPRAZolam  0.25 mg Oral BID  . apixaban  2.5 mg Oral BID  . furosemide  20 mg Oral Daily  . ipratropium-albuterol  3 mL Nebulization TID  . ketotifen  1 drop Left Eye BID  . mouth rinse  15 mL Mouth Rinse BID  . polyvinyl alcohol  1 drop Both Eyes QID  . potassium chloride  10 mEq Oral Daily  . sertraline  25 mg Oral QHS  . sodium chloride flush  3 mL Intravenous Q12H   Continuous Infusions: . sodium chloride       LOS: 2 days    Time spent: > 35 minutes   Velvet Bathe, MD [facility] Pager [phone]  If 7PM-7AM, please contact night-coverage [email or web address] Password [email or web address] 04/23/2017, 5:04 PM

## 2017-04-23 NOTE — Discharge Instructions (Signed)

## 2017-04-23 NOTE — NC FL2 (Signed)
[place] LEVEL OF CARE SCREENING TOOL     IDENTIFICATION  Patient Name: Caroline Myers Birthdate: 23-Jul-1923 Sex: female Admission Date (Current Location): 04/21/2017  [facility] and [place] Number:  Herbalist and Address:  [facility]. [facility] [place] [address], [place],  [address]      Provider Number: [id number]  Attending Physician Name and Address:  Velvet Bathe, MD  Relative Name and Phone Number:  Ranae Casebier, daughter, [phone]    Current Level of Care: Hospital Recommended Level of Care: [place] Prior Approval Number:    Date Approved/Denied:   PASRR Number:    Discharge Plan: Other (Comment) (ALF)    Current Diagnoses: Patient Active Problem List   Diagnosis Date Noted  . Acute on chronic systolic heart failure, NYHA class 3 ([place]) 04/21/2017  . Severe aortic valve stenosis 04/21/2017  . Chronic respiratory failure with hypoxia ([place]) 04/21/2017  . Chronic atrial fibrillation ([place]) 04/21/2017  . Moderate to severe pulmonary hypertension ([place]) 04/21/2017  . COPD (chronic obstructive pulmonary disease) ([place]) 04/21/2017  . Right-sided epistaxis 12/13/2016  . Intracranial hemorrhage ([place]) 12/09/2016  . Hospice care patient 12/09/2016  . Multiple contusions 12/09/2016  . Nasal fracture 12/09/2016  . AC separation, right, initial encounter 12/09/2016  . Fall at home, initial encounter 12/09/2016  . Chronic combined systolic and diastolic CHF (congestive heart failure) ([place]) 09/19/2016  . Chest pain 08/29/2016  . Concussion 07/06/2016  . CHF exacerbation ([place]) 06/28/2016  . Respiratory failure with hypoxia ([place]) 06/28/2016  . URI (upper respiratory infection) 06/28/2016  . Chest pressure 06/28/2016  . Oral dyskinesia 06/28/2016  . Falls 05/20/2016  . Pulmonary hypertension ([place]) 03/15/2016  . Anxiety about health   . Cough   . Hypokalemia   . AKI (acute kidney injury) ([place])   . Debility  07/20/2015  . Sepsis ([place]) 07/20/2015  . History of CVA (cerebrovascular accident)   . Tachypnea   . Leukocytosis 07/17/2015  . Arterial hypotension   . Hypotension 07/16/2015  . Elevated troponin 07/16/2015  . PNA (pneumonia)   . Severe sepsis ([place]) 07/15/2015  . Acute respiratory failure with hypercapnia ([place]) 07/15/2015  . Chronic anticoagulation 04/18/2015  . Orthostatic hypotension 03/09/2015  . CVA (cerebral infarction) 02/14/2015  . Stroke ([place]) 02/14/2015  . Stroke with cerebral ischemia ([place])   . Cerebral infarction due to embolism of left middle cerebral artery ([place])   . Occipital fracture ([place])   . Benign essential HTN   . Other allergic rhinitis   . Depression   . SAH (subarachnoid hemorrhage) ([place]) 01/02/2015  . SDH (subdural hematoma) ([place]) 01/02/2015  . Aortic atherosclerosis ([place]) 08/05/2014  . Nonspecific abnormal unspecified cardiovascular function study 10/28/2013  . Severe aortic stenosis 10/28/2013  . HLD (hyperlipidemia) 07/28/2013  . TIA (transient ischemic attack) 07/27/2013  . History of CHF (congestive heart failure) 07/27/2013  . Urticaria 11/06/2010  . Bronchitis 10/12/2010  . Preventative health care   . Anxiety state 03/29/2010  . RIB PAIN, LEFT SIDED 10/12/2009  . ALLERGIC RHINITIS 06/28/2009  . SHOULDER PAIN, LEFT 06/28/2009  . SCIATICA, RIGHT 06/28/2009  . HEADACHE 06/28/2009  . DYSPNEA ON EXERTION 06/28/2009  . SKIN CANCER, HX OF 06/28/2009    Orientation RESPIRATION BLADDER Height & Weight     Self, Time, Situation, Place  O2 (nasal cannula 2L) Incontinent Weight: 115 lb 8 oz (52.4 kg) Height:     BEHAVIORAL SYMPTOMS/MOOD NEUROLOGICAL BOWEL NUTRITION STATUS  Continent Diet (please see DC summary)  AMBULATORY STATUS COMMUNICATION OF NEEDS Skin   Limited Assist Verbally Normal                       Personal Care Assistance Level of Assistance  Bathing, Feeding, Dressing Bathing Assistance: Limited assistance Feeding  assistance: Independent Dressing Assistance: Limited assistance     Functional Limitations Info  Sight, Hearing, Speech Sight Info: Adequate Hearing Info: Adequate Speech Info: Adequate    SPECIAL CARE FACTORS FREQUENCY  PT (By licensed PT), OT (By licensed OT)     PT Frequency: 5x/week OT Frequency: 5x/week            Contractures Contractures Info: Not present    Additional Factors Info  Code Status, Allergies Code Status Info: DNR Allergies Info: Tape, Verapamil           Current Medications (04/23/2017):  This is the current hospital active medication list Current Facility-Administered Medications  Medication Dose Route Frequency Provider Last Rate Last Dose  . 0.9 %  sodium chloride infusion  250 mL Intravenous PRN Samella Parr, NP      . acetaminophen (TYLENOL) tablet 650 mg  650 mg Oral Q4H PRN Samella Parr, NP   650 mg at 04/21/17 2057  . albuterol (PROVENTIL) (2.5 MG/3ML) 0.083% nebulizer solution 3 mL  3 mL Inhalation Q4H PRN Samella Parr, NP   3 mL at 04/23/17 0517  . ALPRAZolam Duanne Moron) tablet 0.25 mg  0.25 mg Oral BID Erin Hearing L, NP   0.25 mg at 04/23/17 1057  . apixaban (ELIQUIS) tablet 2.5 mg  2.5 mg Oral BID Erin Hearing L, NP   2.5 mg at 04/23/17 1057  . furosemide (LASIX) tablet 20 mg  20 mg Oral Daily Velvet Bathe, MD   20 mg at 04/23/17 1158  . ipratropium-albuterol (DUONEB) 0.5-2.5 (3) MG/3ML nebulizer solution 3 mL  3 mL Nebulization TID Samella Parr, NP   3 mL at 04/23/17 0815  . ketotifen (ZADITOR) 0.025 % ophthalmic solution 1 drop  1 drop Left Eye BID Samella Parr, NP   1 drop at 04/21/17 2233  . MEDLINE mouth rinse  15 mL Mouth Rinse BID Derrill Kay A, MD   15 mL at 04/23/17 1058  . ondansetron (ZOFRAN) injection 4 mg  4 mg Intravenous Q6H PRN Samella Parr, NP      . polyvinyl alcohol (LIQUIFILM TEARS) 1.4 % ophthalmic solution 1 drop  1 drop Both Eyes QID Samella Parr, NP   1 drop at 04/23/17 1057  .  potassium chloride (K-DUR,KLOR-CON) CR tablet 10 mEq  10 mEq Oral Daily Samella Parr, NP   10 mEq at 04/23/17 1057  . sertraline (ZOLOFT) tablet 25 mg  25 mg Oral QHS Samella Parr, NP   25 mg at 04/22/17 2054  . sodium chloride flush (NS) 0.9 % injection 3 mL  3 mL Intravenous Q12H Erin Hearing L, NP   3 mL at 04/23/17 1000  . sodium chloride flush (NS) 0.9 % injection 3 mL  3 mL Intravenous PRN Samella Parr, NP         Discharge Medications: Please see discharge summary for a list of discharge medications.  Relevant Imaging Results:  Relevant Lab Results:   Additional Information SSN: [id number]  Estanislado Emms, LCSW

## 2017-04-23 NOTE — Progress Notes (Signed)
  Echocardiogram 2D Echocardiogram has been performed.  Caroline Myers 04/23/2017, 3:44 PM

## 2017-04-23 NOTE — Clinical Social Work Note (Signed)
Clinical Social Work Assessment  Patient Details  Name: Caroline Myers MRN: [id number] Date of Birth: [birth year]  Date of referral:  04/23/17               Reason for consult:  Facility Placement (from [facility])                Permission sought to share information with:  Facility Sport and exercise psychologist, Family Supports Permission granted to share information::  Yes, Verbal Permission Granted  Name::     Caroline Myers  Agency::  [facility]  Relationship::  daughter  Contact Information:  [phone]  Housing/Transportation Living arrangements for the past 2 months:  [place] of Information:  Patient Patient Interpreter Needed:  None Criminal Activity/Legal Involvement Pertinent to Current Situation/Hospitalization:  No - Comment as needed Significant Relationships:  Adult Children Lives with:  Facility Resident Do you feel safe going back to the place where you live?  Yes Need for family participation in patient care:  Yes (Comment)  Care giving concerns: Patient from [facility]. Was previously followed by hospice but hospice care stopped due to patient's extended prognosis.   Social Worker assessment / plan: CSW met with patient at bedside. Patient alert and oriented. CSW discussed disposition planning. Patient prefers to go back to [facility], though defers to daughter regarding home health v hospice. Patient stated hospice stopped because "I was doing too well."  Patient talked about her family and late husband, who passed about a year ago. Patient talked about her grieving process and transition to ALF. CSW offered reflective and empathetic listening and normalized patient's experience.   CSW talked to patient's daughter, Caroline, via phone. Daughter with concerns about patient's care at ALF. Patient and daughter indicated patient was not feeling well at ALF and called staff for help, but there was no answer; patient ended up calling EMS  herself. Daughter hopeful for palliative consult while patient admitted, and hopeful that hospice could restart following patient at ALF. Daughter also agreeable for patient to continue PT at ALF. CSW to follow and support with disposition planning.  Employment status:  Retired Forensic scientist:  Medicare PT Recommendations:  Home with [place] / Referral to community resources:  Other (Comment Required) (back to ALF)  Patient/Family's Response to care: Patient and daughter appreciative of care.  Patient/Family's Understanding of and Emotional Response to Diagnosis, Current Treatment, and Prognosis: Patient and daughter with good understanding of patient's condition and plan for treatment. Hopeful for return to ALF with hospice if they will restart services.  Emotional Assessment Appearance:  Appears stated age Attitude/Demeanor/Rapport:  Other (appropriate) Affect (typically observed):  Calm, Pleasant Orientation:  Oriented to Self, Oriented to Place, Oriented to  Time, Oriented to Situation Alcohol / Substance use:  Not Applicable Psych involvement (Current and /or in the community):  No (Comment)  Discharge Needs  Concerns to be addressed:  Discharge Planning Concerns Readmission within the last 30 days:  No Current discharge risk:  Physical Impairment Barriers to Discharge:  Continued Medical Work up   Estanislado Emms, LCSW 04/23/2017, 12:56 PM

## 2017-04-24 LAB — BASIC METABOLIC PANEL
Anion gap: 11 (ref 5–15)
Anion gap: 11 (ref 5–15)
BUN: 33 mg/dL — AB (ref 6–20)
BUN: 31 mg/dL — AB (ref 6–20)
CALCIUM: 8.6 mg/dL — AB (ref 8.9–10.3)
CALCIUM: 8.8 mg/dL — AB (ref 8.9–10.3)
CHLORIDE: 93 mmol/L — AB (ref 101–111)
CHLORIDE: 95 mmol/L — AB (ref 101–111)
CO2: 29 mmol/L (ref 22–32)
CO2: 30 mmol/L (ref 22–32)
CREATININE: 1.32 mg/dL — AB (ref 0.44–1.00)
CREATININE: 1.3 mg/dL — AB (ref 0.44–1.00)
GFR, EST AFRICAN AMERICAN: 40 mL/min — AB (ref >60)
GFR, EST AFRICAN AMERICAN: 41 mL/min — AB (ref >60)
GFR, EST NON AFRICAN AMERICAN: 34 mL/min — AB (ref >60)
GFR, EST NON AFRICAN AMERICAN: 35 mL/min — AB (ref >60)
Glucose, Bld: 102 mg/dL — ABNORMAL HIGH (ref 65–99)
Glucose, Bld: 98 mg/dL (ref 65–99)
Potassium: 4.3 mmol/L (ref 3.5–5.1)
Potassium: 4 mmol/L (ref 3.5–5.1)
SODIUM: 133 mmol/L — AB (ref 135–145)
SODIUM: 136 mmol/L (ref 135–145)

## 2017-04-24 LAB — TROPONIN I: TROPONIN I: 0.05 ng/mL — AB (ref <0.03)

## 2017-04-24 MED ORDER — HYDROXYZINE HCL 25 MG PO TABS
25.0000 mg | ORAL_TABLET | Freq: Once | ORAL | Status: AC
  Administered 2017-04-24: 25 mg via ORAL
  Filled 2017-04-24: qty 1

## 2017-04-24 MED ORDER — ENSURE ENLIVE PO LIQD
237.0000 mL | Freq: Two times a day (BID) | ORAL | Status: DC
  Administered 2017-04-24 – 2017-04-25 (×2): 237 mL via ORAL

## 2017-04-24 MED ORDER — INFLUENZA VAC SPLIT HIGH-DOSE 0.5 ML IM SUSY
0.5000 mL | PREFILLED_SYRINGE | INTRAMUSCULAR | Status: AC
  Administered 2017-04-25: 0.5 mL via INTRAMUSCULAR
  Filled 2017-04-24 (×2): qty 0.5

## 2017-04-24 NOTE — Consult Note (Signed)
   [facility] CM Inpatient Consult   04/24/2017  Caroline Myers 07-29-1923 [id number]  Chart reviewed for Medicare ACO in regards to ED and admissions for HF exacerbation. Patient is to return to [facility]. Chart reviewed for [facility] management needs and patient has 24 hour assistance noted in PT notes. No needs identified at this time. Her currentprimary care providers are physicians in [place] which is 'not' in the [place].  Will sign off.  Natividad Brood, RN BSN [facility] Liaison  [phone] business mobile phone Toll free office [phone]

## 2017-04-24 NOTE — Plan of Care (Signed)
Problem: Safety: Goal: Ability to remain free from injury will improve Outcome: Progressing Bed in low position, bed alarm on, non skid socks on, hourly rounds done, pt calls for assistance appropriately.

## 2017-04-24 NOTE — Progress Notes (Signed)
Physical Therapy Treatment Patient Details Name: Caroline Myers MRN: [id number] DOB: 1924/05/29 Today's Date: 04/24/2017    History of Present Illness Patient is a 83 y/o female who presents with SOB. CXR-consistent with heart failure/pulmonary edema. PMH includes PAF, CVA, ICH, back surgery, chronic combined systolic/diastolic heart failure in the setting of severe aortic stenosis and severe pulmonary hypertension, A-fib.    PT Comments    Patient progressing slowly towards PT goals. Improved ambulation distance with less fatigue than prior session. Continues to demonstrate impaired balance and impaired endurance requiring multiple standing rest breaks. 2-3/4 DOE noted. Sp02 remained in 90s on 2L/min 02. Anxious about returning to ALF. Will continue to follow and progress as tolerated.    Follow Up Recommendations  Home health PT;Supervision for mobility/OOB;Supervision/Assistance - 24 hour (return to ALF)     Equipment Recommendations  None recommended by PT    Recommendations for Other Services       Precautions / Restrictions Precautions Precautions: Fall Precaution Comments: watch BP Restrictions Weight Bearing Restrictions: No    Mobility  Bed Mobility               General bed mobility comments: Up in chair upon PT arrival.   Transfers Overall transfer level: Needs assistance Equipment used: Rolling walker (2 wheeled) Transfers: Sit to/from Stand Sit to Stand: Min guard         General transfer comment: cues for hand placement, min guard for safety, moves very slowly. Use of momentum to stand.  Ambulation/Gait Ambulation/Gait assistance: Min assist Ambulation Distance (Feet): 50 Feet Assistive device: Rolling walker (2 wheeled) Gait Pattern/deviations: Step-through pattern;Decreased stride length;Trunk flexed Gait velocity: decreased   General Gait Details: Slow, unsteady gait with occasional Min A for support. 3/4 DOE. Multiple standing rest  breaks due to fatigue and SOB. Sp02 remained >92% on 2L/min 02.   Stairs            Wheelchair Mobility    Modified Rankin (Stroke Patients Only)       Balance Overall balance assessment: Needs assistance Sitting-balance support: Feet supported;No upper extremity supported Sitting balance-Leahy Scale: Good     Standing balance support: During functional activity;Bilateral upper extremity supported Standing balance-Leahy Scale: Poor Standing balance comment: Use of UEs for support in standing.                            Cognition Arousal/Alertness: Awake/alert Behavior During Therapy: WFL for tasks assessed/performed Overall Cognitive Status: Impaired/Different from baseline Area of Impairment: Problem solving                             Problem Solving: Slow processing;Decreased initiation;Difficulty sequencing;Requires verbal cues;Requires tactile cues        Exercises      General Comments General comments (skin integrity, edema, etc.): BP pre activity 80/63, BP post activity 104/66      Pertinent Vitals/Pain Pain Assessment: Faces Faces Pain Scale: Hurts little more Pain Location: chest/abdomen Pain Descriptors / Indicators: Aching Pain Intervention(s): Monitored during session;Repositioned;Limited activity within patient's tolerance    Home Living                      Prior Function            PT Goals (current goals can now be found in the care plan section) Progress towards PT goals: Progressing toward goals  Frequency    Min 2X/week      PT Plan Current plan remains appropriate    Co-evaluation              AM-PAC PT "6 Clicks" Daily Activity  Outcome Measure  Difficulty turning over in bed (including adjusting bedclothes, sheets and blankets)?: None Difficulty moving from lying on back to sitting on the side of the bed? : None Difficulty sitting down on and standing up from a chair with arms  (e.g., wheelchair, bedside commode, etc,.)?: None Help needed moving to and from a bed to chair (including a wheelchair)?: A Little Help needed walking in hospital room?: A Little Help needed climbing 3-5 steps with a railing? : A Lot 6 Click Score: 20    End of Session Equipment Utilized During Treatment: Gait belt;Oxygen Activity Tolerance: Patient limited by fatigue;Patient tolerated treatment well Patient left: in chair;with call bell/phone within reach;with chair alarm set Nurse Communication: Mobility status PT Visit Diagnosis: Unsteadiness on feet (R26.81);Muscle weakness (generalized) (M62.81);Other (comment) (DOE)     Time: 2500-3704 PT Time Calculation (min) (ACUTE ONLY): 21 min  Charges:  $Gait Training: 8-22 mins                    G Codes:       Wray Kearns, PT, DPT [phone]     [place] 04/24/2017, 12:10 PM

## 2017-04-24 NOTE — Progress Notes (Signed)
Patient was more relaxed tonight and has been sleeping throughout the night. PRN-Tylenol given for right sided pain near ribs.

## 2017-04-24 NOTE — Progress Notes (Signed)
PROGRESS NOTE    Caroline Myers  VFI:[id number] DOB: 1923/08/12 DOA: 04/21/2017 [facility]CP: [facility]   Brief Narrative:  83 y.o. female with medical history significant for chronic atrial fibrillation on Eliquis, history of CVA x2, chronic combined systolic/diastolic heart failure in the setting of severe aortic stenosis and severe pulmonary hypertension, COPD, anxiety, chronic hypoxemia on nasal cannula oxygen.  Patient previously on hospice services secondary to her heart failure but was disqualified after apparently stabilizing with her heart failure symptoms.    Assessment & Plan:   Principal Problem:   Acute on chronic systolic heart failure, NYHA class 3 (HCC) - We'll obtain echocardiogram to assess and see if CHF has worsened. Daughter is interested in trying to get patient into hospice care again but last echocardiogram reported improvement in systolic function - Continue lasix but orally. Patient had increase in serum creatinine and bun will reassess level next am.  Active Problems:   Severe aortic valve stenosis   Chronic respiratory failure with hypoxia (HCC)   Chronic atrial fibrillation (HCC) - continue patient on eliquis  Hypokalemia - resolved    Moderate to severe pulmonary hypertension ([place]) - may be contributing to principle problem    COPD (chronic obstructive pulmonary disease) (HCC) - stable currently, continue duoneb - albuterol prn  chest pain - obtained EKG which did not show any ST elevations or depressions, troponin at 0.05 patient describes chest tightness.But per my discussion with daughter patient has been complaining of this for a month.   DVT prophylaxis: Eliquis Code Status: DNR Family Communication: none at bedside. Disposition Plan: d/c next am most likely with continued improvement   Consultants:   None   Procedures: Echocardiogram   Antimicrobials: None   Subjective: Pt breathing near baseline. Not worse  today.  Objective: Vitals:   04/24/17 0859 04/24/17 0900 04/24/17 1337 04/24/17 1548  BP:      Pulse:  85 72   Resp:  15 16   Temp:   98.2 F (36.8 C)   TempSrc:   Oral   SpO2: 98%  99% 99%  Weight:        Intake/Output Summary (Last 24 hours) at 04/24/17 1608 Last data filed at 04/24/17 1028  Gross per 24 hour  Intake              840 ml  Output              150 ml  Net              690 ml   Filed Weights   04/22/17 0500 04/23/17 0451 04/24/17 0700  Weight: 52.6 kg (115 lb 14.4 oz) 52.4 kg (115 lb 8 oz) 52.7 kg (116 lb 3.2 oz)    Examination: exam unchanged when compared to 04/23/2017  General exam: Appears calm and comfortable, in nad. Respiratory system: prolonged exp phase, equal chest rise, mild wheezes on expiration. Cardiovascular system: S1 & S2 heard, RRR. No JVD, murmurs, rubs Gastrointestinal system: Abdomen is nondistended, soft and nontender. No organomegaly or masses felt.  Central nervous system: Alert and oriented. No focal neurological deficits. Extremities: Symmetric 5 x 5 power. Skin: No rashes, lesions or ulcers, on limited exam. Psychiatry:  Mood & affect appropriate.   Data Reviewed: I have personally reviewed following labs and imaging studies  CBC:  Recent Labs Lab 04/21/17 0530  WBC 8.4  HGB 13.1  HCT 40.8  MCV 99.0  PLT 416   Basic Metabolic Panel:  Recent  Labs Lab 04/21/17 2020 04/22/17 0403 04/23/17 0442 04/23/17 2324 04/24/17 0403  NA 134* 134* 135 133* 136  K 3.7 3.6 3.3* 4.3 4.0  CL 94* 93* 94* 93* 95*  CO2 31 30 32 29 30  GLUCOSE 95 97 105* 102* 98  BUN 13 11 24* 33* 31*  CREATININE 0.78 0.89 1.17* 1.32* 1.30*  CALCIUM 8.4* 8.4* 8.5* 8.6* 8.8*  MG 1.9  --   --   --   --    GFR: Estimated Creatinine Clearance: 23.9 mL/min (A) (by C-G formula based on SCr of 1.3 mg/dL (H)). Liver Function Tests: No results for input(s): AST, ALT, ALKPHOS, BILITOT, PROT, ALBUMIN in the last 168 hours. No results for input(s):  LIPASE, AMYLASE in the last 168 hours. No results for input(s): AMMONIA in the last 168 hours. Coagulation Profile: No results for input(s): INR, PROTIME in the last 168 hours. Cardiac Enzymes:  Recent Labs Lab 04/21/17 0530 04/24/17 0743  TROPONINI 0.04* 0.05*   BNP (last 3 results) No results for input(s): PROBNP in the last 8760 hours. HbA1C: No results for input(s): HGBA1C in the last 72 hours. CBG:  Recent Labs Lab 04/22/17 1632  GLUCAP 110*   Lipid Profile: No results for input(s): CHOL, HDL, LDLCALC, TRIG, CHOLHDL, LDLDIRECT in the last 72 hours. Thyroid Function Tests: No results for input(s): TSH, T4TOTAL, FREET4, T3FREE, THYROIDAB in the last 72 hours. Anemia Panel: No results for input(s): VITAMINB12, FOLATE, FERRITIN, TIBC, IRON, RETICCTPCT in the last 72 hours. Sepsis Labs: No results for input(s): PROCALCITON, LATICACIDVEN in the last 168 hours.  No results found for this or any previous visit (from the past 240 hour(s)).       Radiology Studies: No results found.      Scheduled Meds: . ALPRAZolam  0.25 mg Oral BID  . apixaban  2.5 mg Oral BID  . feeding supplement (ENSURE ENLIVE)  237 mL Oral BID BM  . furosemide  20 mg Oral Daily  . ipratropium-albuterol  3 mL Nebulization TID  . ketotifen  1 drop Left Eye BID  . mouth rinse  15 mL Mouth Rinse BID  . polyvinyl alcohol  1 drop Both Eyes QID  . potassium chloride  10 mEq Oral Daily  . sertraline  25 mg Oral QHS  . sodium chloride flush  3 mL Intravenous Q12H   Continuous Infusions: . sodium chloride       LOS: 3 days    Time spent: > 35 minutes   Velvet Bathe, MD [facility] Pager [phone]  If 7PM-7AM, please contact night-coverage [email or web address] Password [email or web address] 04/24/2017, 4:08 PM

## 2017-04-24 NOTE — Progress Notes (Signed)
CRITICAL VALUE ALERT  Critical Value:  Troponin 0.05  Date & Time Notied:  04/24/17 0929  Provider Notified: Wendee Beavers  Orders Received/Actions taken: no new orders

## 2017-04-25 LAB — BASIC METABOLIC PANEL
ANION GAP: 11 (ref 5–15)
BUN: 33 mg/dL — ABNORMAL HIGH (ref 6–20)
CALCIUM: 8.7 mg/dL — AB (ref 8.9–10.3)
CO2: 29 mmol/L (ref 22–32)
CREATININE: 1.12 mg/dL — AB (ref 0.44–1.00)
Chloride: 94 mmol/L — ABNORMAL LOW (ref 101–111)
GFR, EST AFRICAN AMERICAN: 49 mL/min — AB (ref >60)
GFR, EST NON AFRICAN AMERICAN: 42 mL/min — AB (ref >60)
Glucose, Bld: 105 mg/dL — ABNORMAL HIGH (ref 65–99)
Potassium: 4.4 mmol/L (ref 3.5–5.1)
SODIUM: 134 mmol/L — AB (ref 135–145)

## 2017-04-25 NOTE — Discharge Summary (Addendum)
Physician Discharge Summary  Caroline Myers PJK:[id number] DOB: 1924/04/11 DOA: 04/21/2017  PCP: [facility]  Admit date: 04/21/2017 Discharge date: 04/25/2017  Time spent: > 35 minutes  Recommendations for Outpatient Follow-up:  1. Ensure f/u with cardiologist   Discharge Diagnoses:  Principal Problem:   Acute on chronic systolic heart failure, NYHA class 3 (HCC) Active Problems:   Severe aortic valve stenosis   Chronic respiratory failure with hypoxia (HCC)   Chronic atrial fibrillation (HCC)   Moderate to severe pulmonary hypertension (HCC)   COPD (chronic obstructive pulmonary disease) ([place])   Discharge Condition: > 35 minutes  Diet recommendation: Heart healthy  Filed Weights   04/24/17 0700 04/25/17 0005 04/25/17 0500  Weight: 52.7 kg (116 lb 3.2 oz) 52.4 kg (115 lb 9.6 oz) 52.4 kg (115 lb 8.3 oz)    History of present illness:  83 y.o. female with medical history significant for chronic atrial fibrillation on Eliquis, history of CVA x2, chronic combined systolic/diastolic heart failure in the setting of severe aortic stenosis and severe pulmonary hypertension, COPD, anxiety, chronic hypoxemia on nasal cannula oxygen  Hospital Course:  Principal Problem:   Acute on chronic systolic heart failure, NYHA class 3 (HCC) - Echocardiogram reporting improved EF when compared to last - recommended patient f/u with cardiologist - improved and patient back at baseline breathing status with oxygen at 2 L via [place] - continue prior to admission medication regimen  Active Problems:   Severe aortic valve stenosis   Chronic respiratory failure with hypoxia ([place])    Chronic atrial fibrillation ([place]) - continue prior to admission medication regimen.  Hypokalemia - resolved    Moderate to severe pulmonary hypertension (HCC) - stable    COPD (chronic obstructive pulmonary disease) (HCC) - stable currently - albuterol prn  chest pain - obtained EKG  which did not show any ST elevations or depressions, troponin at 0.05 patient describes chest tightness. But per my discussion with daughter patient has been complaining of this for a month. - no chest pressure or tightness reported on day of discharge.   Procedures:  None  Consultations:  none  Discharge Exam: Vitals:   04/25/17 0630 04/25/17 0906  BP: 116/69   Pulse: 78   Resp: (!) 28   Temp: 99.1 F (37.3 C)   SpO2: 99% 99%    General: Pt in nad, alert and awake Cardiovascular: s1 and s2 no rubs Respiratory: no increased wob, equal chest rise, no wheezes, [place] in place.  Discharge Instructions   Discharge Instructions    Call MD for:  extreme fatigue    Complete by:  As directed    Call MD for:  severe uncontrolled pain    Complete by:  As directed    Call MD for:  temperature >100.4    Complete by:  As directed    Diet - low sodium heart healthy    Complete by:  As directed    Increase activity slowly    Complete by:  As directed      Current Discharge Medication List    CONTINUE these medications which have NOT CHANGED   Details  acetaminophen (TYLENOL) 325 MG tablet Take 650 mg by mouth every 4 (four) hours as needed for mild pain or fever.    acetaminophen (TYLENOL) 650 MG suppository Place 650 mg rectally every 4 (four) hours as needed for mild pain or fever.    ALPRAZolam (XANAX) 0.25 MG tablet Take 0.25 mg by mouth 2 (two) times  daily.     apixaban (ELIQUIS) 2.5 MG TABS tablet Take 2.5 mg by mouth 2 (two) times daily.    azelastine (OPTIVAR) 0.05 % ophthalmic solution Place 1 drop into the left eye daily as needed.  Refills: 0    furosemide (LASIX) 20 MG tablet Take 0.5 tablets (10 mg total) by mouth daily. Qty: 15 tablet, Refills: 0    !! ipratropium-albuterol (DUONEB) 0.5-2.5 (3) MG/3ML SOLN Take 3 mLs by nebulization 3 (three) times daily.  Refills: 2    !! ipratropium-albuterol (DUONEB) 0.5-2.5 (3) MG/3ML SOLN Take 3 mLs by nebulization  every 4 (four) hours as needed (for shortness of breath).    oxymetazoline (AFRIN) 0.05 % nasal spray Place 2 sprays into both nostrils 2 (two) times daily as needed for congestion.    polyvinyl alcohol (LIQUIFILM TEARS) 1.4 % ophthalmic solution Place 1 drop into both eyes 4 (four) times daily.    potassium chloride (K-DUR) 10 MEQ tablet Take 1 tablet (10 mEq total) by mouth daily. Qty: 90 tablet, Refills: 3    PROAIR HFA 108 (90 Base) MCG/ACT inhaler Inhale 2 puffs into the lungs every 4 (four) hours as needed for wheezing or shortness of breath. Qty: 18 g, Refills: 0    !! sertraline (ZOLOFT) 25 MG tablet Take 25 mg by mouth at bedtime.    !! sertraline (ZOLOFT) 50 MG tablet Take 1 tablet (50 mg total) by mouth daily. Qty: 30 tablet, Refills: 4    spironolactone (ALDACTONE) 25 MG tablet Take 12.5 mg by mouth every Monday, Wednesday, and Friday.  Refills: 3    levETIRAcetam (KEPPRA) 500 MG tablet Take 1 tablet (500 mg total) by mouth 2 (two) times daily. Qty: 14 tablet, Refills: 0     !! - Potential duplicate medications found. Please discuss with provider.     Allergies  Allergen Reactions  . Tape Other (See Comments)    Rips skin-paper tape  . Verapamil Other (See Comments)    Per MAR   Follow-up Information    [facility] Follow up.   Specialty:  Home Health Services Why:  Physical Therapy.  Contact information: [address] [place] [address] [phone]            The results of significant diagnostics from this hospitalization (including imaging, microbiology, ancillary and laboratory) are listed below for reference.    Significant Diagnostic Studies: Dg Chest Portable 1 View  Result Date: 04/21/2017 CLINICAL DATA:  83 y/o  F; worsening shortness of breath. EXAM: PORTABLE CHEST 1 VIEW COMPARISON:  12/09/2016 chest radiograph. FINDINGS: Stable cardiomegaly given projection and technique. Aortic atherosclerosis with calcification.  Reticular markings and peripheral linear opacities compatible with interstitial pulmonary edema. No focal consolidation. Small right pleural effusion. Moderate dextrocurvature of the thoracic spine. IMPRESSION: Stable cardiomegaly. Interstitial pulmonary edema. Small right pleural effusion. Electronically Signed   By: Kristine Garbe M.D.   On: 04/21/2017 06:00    Microbiology: No results found for this or any previous visit (from the past 240 hour(s)).   Labs: Basic Metabolic Panel:  Recent Labs Lab 04/21/17 2020 04/22/17 0403 04/23/17 0442 04/23/17 2324 04/24/17 0403 04/25/17 0329  NA 134* 134* 135 133* 136 134*  K 3.7 3.6 3.3* 4.3 4.0 4.4  CL 94* 93* 94* 93* 95* 94*  CO2 31 30 32 29 30 29   GLUCOSE 95 97 105* 102* 98 105*  BUN 13 11 24* 33* 31* 33*  CREATININE 0.78 0.89 1.17* 1.32* 1.30* 1.12*  CALCIUM 8.4* 8.4* 8.5* 8.6* 8.8* 8.7*  MG 1.9  --   --   --   --   --    Liver Function Tests: No results for input(s): AST, ALT, ALKPHOS, BILITOT, PROT, ALBUMIN in the last 168 hours. No results for input(s): LIPASE, AMYLASE in the last 168 hours. No results for input(s): AMMONIA in the last 168 hours. CBC:  Recent Labs Lab 04/21/17 0530  WBC 8.4  HGB 13.1  HCT 40.8  MCV 99.0  PLT 279   Cardiac Enzymes:  Recent Labs Lab 04/21/17 0530 04/24/17 0743  TROPONINI 0.04* 0.05*   BNP: BNP (last 3 results)  Recent Labs  08/11/16 1529 08/29/16 0947 04/21/17 0530  BNP 291.3* 214.0* 226.2*    ProBNP (last 3 results) No results for input(s): PROBNP in the last 8760 hours.  CBG:  Recent Labs Lab 04/22/17 1632  GLUCAP 110*    Signed:  Velvet Bathe MD.  [facility] 04/25/2017, 1:56 PM

## 2017-04-25 NOTE — Progress Notes (Signed)
Pt's daughter came at the Bedside and states that pt is too weak to be discharged today. I have examine the pt. pt is Alert eating her supper. VS are BP 101/72, P: 86,  T: 98.8 O2 sat 98 % on 2 l O2. MD notified.   Ferdinand Lango, RN

## 2017-04-25 NOTE — Care Management (Signed)
ED CM received call from [place] on [place] concerning patient not having need for 3 n1 and rolling walker.ED CM will notify [facility] to return and provide credit for the items.

## 2017-04-25 NOTE — Progress Notes (Signed)
Patient will discharge back to [facility]  Anticipated discharge date: 04/25/17 Family notified: [name], daughter Transportation by: [facility]  Nurse to call report to [phone].   CSW signing off.  Estanislado Emms, [place]  Clinical Social Worker

## 2017-04-25 NOTE — Care Management Important Message (Signed)
Important Message  Patient Details  Name: Caroline Myers MRN: [id number] Date of Birth: 01-Nov-1923   Medicare Important Message Given:  Yes    Nathen May 04/25/2017, 10:37 AM

## 2017-04-25 NOTE — Progress Notes (Signed)
CSW received a call pt desires to "appeal their D/C" from pt's RN.  CSW noted pt has Medicare A&B and referred pt's RN to CM for appeal process.  Please reconsult if future social work needs arise.  CSW signing off, as social work intervention is no longer needed.  Alphonse Guild. Willam Munford, LCSW, LCAS, CSI Clinical Social Worker Ph: [phone]     '

## 2017-04-25 NOTE — Progress Notes (Signed)
Occupational Therapy Treatment Patient Details Name: Caroline Myers MRN: [id number] DOB: [birth year] Today's Date: 04/25/2017    History of present illness Patient is a 83 y/o female who presents with SOB. CXR-consistent with heart failure/pulmonary edema. PMH includes PAF, CVA, ICH, back surgery, chronic combined systolic/diastolic heart failure in the setting of severe aortic stenosis and severe pulmonary hypertension, A-fib.   OT comments  This 83 yo female admitted with above presents to acute OT making progress towards toileting with main issue being DOE/increased WOB/decreased endurance. She will continue to benefit from acute OT with follow up [place] at ALF to get back to her PLOF of mobility from an W/C standpoint.  Follow Up Recommendations  Home health OT;Supervision/Assistance - 24 hour (at ALF)    Equipment Recommendations  None recommended by OT       Precautions / Restrictions Precautions Precautions: Fall Restrictions Weight Bearing Restrictions: No       Mobility Bed Mobility Overal bed mobility: Needs Assistance Bed Mobility: Supine to Sit     Supine to sit: Min assist;HOB elevated (use or rail. Assist to elevate trunk)        Transfers Overall transfer level: Needs assistance Equipment used: Rolling walker (2 wheeled) Transfers: Sit to/from Stand Sit to Stand: Min guard              Balance Overall balance assessment: Needs assistance Sitting-balance support: No upper extremity supported;Feet supported Sitting balance-Leahy Scale: Good     Standing balance support: During functional activity;No upper extremity supported   Standing balance comment: to pull down/up Depends                           ADL either performed or assessed with clinical judgement   ADL Overall ADL's : Needs assistance/impaired                         Toilet Transfer: Min guard;Ambulation;RW;BSC (over toilet)   Toileting- Clothing  Manipulation and Hygiene: Min guard;Sit to/from stand         General ADL Comments: VCs to remind pt of purse lipped breathing. increased time for all basic ADLs due to DOE/increased WOB     Vision Baseline Vision/History: Wears glasses Wears Glasses: Reading only            Cognition Arousal/Alertness: Awake/alert Behavior During Therapy: WFL for tasks assessed/performed Overall Cognitive Status: Within Functional Limits for tasks assessed                                                     Pertinent Vitals/ Pain       Pain Assessment: Faces Pain Score: 3  Pain Location: abdomen Pain Descriptors / Indicators: Tightness Pain Intervention(s): Monitored during session;Repositioned;Limited activity within patient's tolerance         Frequency  Min 2X/week        Progress Toward Goals  OT Goals(current goals can now be found in the care plan section)  Progress towards OT goals: Progressing toward goals     Plan Discharge plan remains appropriate       AM-PAC PT "6 Clicks" Daily Activity     Outcome Measure   Help from another person eating meals?: None Help from another person taking care of personal grooming?:  A Little Help from another person toileting, which includes using toliet, bedpan, or urinal?: A Little Help from another person bathing (including washing, rinsing, drying)?: A Little Help from another person to put on and taking off regular upper body clothing?: A Little Help from another person to put on and taking off regular lower body clothing?: A Lot 6 Click Score: 18    End of Session Equipment Utilized During Treatment: Gait belt;Rolling walker;Oxygen (2 liters)  OT Visit Diagnosis: Unsteadiness on feet (R26.81);Muscle weakness (generalized) (M62.81)   Activity Tolerance  (pt had to stop once on way to bathroom and once on way back from bathroom to catch her breath)   Patient Left in chair;with call bell/phone within  reach;with chair alarm set   Nurse Communication  (pt urinated and had a small bowel movement)        Time: 1093-2355 OT Time Calculation (min): 37 min  Charges: OT General Charges $OT Visit: 1 Visit OT Treatments $Self Care/Home Management : 23-37 mins  [place]en Circle, OTR/L [phone] 04/25/2017

## 2017-04-25 NOTE — Progress Notes (Signed)
Initial Nutrition Assessment  DOCUMENTATION CODES:   Severe malnutrition in context of social or environmental circumstances  INTERVENTION:   -Continue Ensure Enlive, each supplement provides 350 kcals and 20 grams of protein  NUTRITION DIAGNOSIS:   Severe Malnutrition related to social / environmental circumstances, decreased appetite (husband died, pt moved to [facility]) as evidenced by percent weight loss, severe fat depletion, severe muscle depletion, energy intake < or equal to 50% for > or equal to 1 month.  GOAL:   Patient will meet greater than or equal to 90% of their needs  MONITOR:   PO intake, Supplement acceptance, Labs, Weight trends  REASON FOR ASSESSMENT:   Malnutrition Screening Tool    ASSESSMENT:   Pt is a 83 year old female admitted from [facility] for SOB and anxiety. PMH of Afib on Eliquis, CVA x2, chronic HF with severe aortic stenosis and severe pulmonary HTN, COPD, and chronic hypoxemia on nasal cannula oxygen.  Pt is on Heart Diet and said that her appetite has been hit or miss since admission depending on how she felt. Per chart, she ate 50-100% of her meals since 10/24. She reports drinking the Ensures sent to her and liking them.   PTA, pt reports typically eating cereal with orange juice for breakfast, a snack item for lunch, and a small portion of chicken,vegetables and pasta for dinner. She reports that her appetite in the last several weeks is nowhere near what it used to be and she's unsure why.  Pt reports losing 10 lbs in the past 9 months and says her weight loss began when her husband died and she moved to the [facility]. Per weight records in chart, pt weighed 115 lbs on 04/25/17, 126 lbs on 12/14/16, and 136 lbs on 3/30. This indicates a 15% weight loss in 7 months, severe for time frame.  Medications: Lasix  Labs: Na 134 (L), Glucose 105 (H), BUN 33 (H), Cr 1.12 (H), Ca 8.7 (L)  NUTRITION  - FOCUSED PHYSICAL EXAM:  Severe fat depletion in upper arm, thoracic, and buccal regions. Severe muscle depletion in temple, clavicle, acromion, scapula, patellar, and thigh regions. No edema.  Diet Order:  Diet Heart Room service appropriate? Yes; Fluid consistency: Thin Diet - low sodium heart healthy  EDUCATION NEEDS:   No education needs have been identified at this time  Skin:  Skin Assessment: Reviewed RN Assessment  Last BM:  04/23/17  Height:   Ht Readings from Last 1 Encounters:  12/14/16 5\' 5"  (1.651 m)    Weight:   Wt Readings from Last 1 Encounters:  04/25/17 115 lb 8.3 oz (52.4 kg)    Ideal Body Weight:  56.8 kg  BMI:  Body mass index is 19.22 kg/m.  Estimated Nutritional Needs:   Kcal:  1200-1400 kcals  Protein:  60-70 grams   Fluid:  1.2-1.4 [place] Dietetic Intern Pager: [phone] 04/25/2017 2:52 PM

## 2017-04-25 NOTE — Progress Notes (Signed)
Pt and daughter have stated that pt is feeling better after breathing treatment and can be D/C today.  I have called report to the admitting facility [facility], and talked with Helene Kelp, RN and gave report. I have answered all questions.  Awaiting carelink for transport at this time.   Ferdinand Lango, RN

## 2017-04-28 DIAGNOSIS — H0100B Unspecified blepharitis left eye, upper and lower eyelids: Secondary | ICD-10-CM | POA: Diagnosis not present

## 2017-04-28 DIAGNOSIS — F0630 Mood disorder due to known physiological condition, unspecified: Secondary | ICD-10-CM | POA: Diagnosis not present

## 2017-04-28 DIAGNOSIS — I350 Nonrheumatic aortic (valve) stenosis: Secondary | ICD-10-CM | POA: Diagnosis not present

## 2017-04-28 DIAGNOSIS — I110 Hypertensive heart disease with heart failure: Secondary | ICD-10-CM | POA: Diagnosis not present

## 2017-04-28 DIAGNOSIS — J449 Chronic obstructive pulmonary disease, unspecified: Secondary | ICD-10-CM | POA: Diagnosis not present

## 2017-04-28 DIAGNOSIS — J349 Unspecified disorder of nose and nasal sinuses: Secondary | ICD-10-CM | POA: Diagnosis not present

## 2017-04-28 DIAGNOSIS — I5032 Chronic diastolic (congestive) heart failure: Secondary | ICD-10-CM | POA: Diagnosis not present

## 2017-04-28 DIAGNOSIS — Z9981 Dependence on supplemental oxygen: Secondary | ICD-10-CM | POA: Diagnosis not present

## 2017-04-28 DIAGNOSIS — I5023 Acute on chronic systolic (congestive) heart failure: Secondary | ICD-10-CM | POA: Diagnosis not present

## 2017-04-28 DIAGNOSIS — J9611 Chronic respiratory failure with hypoxia: Secondary | ICD-10-CM | POA: Diagnosis not present

## 2017-04-30 DIAGNOSIS — J449 Chronic obstructive pulmonary disease, unspecified: Secondary | ICD-10-CM | POA: Diagnosis not present

## 2017-04-30 DIAGNOSIS — I350 Nonrheumatic aortic (valve) stenosis: Secondary | ICD-10-CM | POA: Diagnosis not present

## 2017-04-30 DIAGNOSIS — I5032 Chronic diastolic (congestive) heart failure: Secondary | ICD-10-CM | POA: Diagnosis not present

## 2017-04-30 DIAGNOSIS — I5023 Acute on chronic systolic (congestive) heart failure: Secondary | ICD-10-CM | POA: Diagnosis not present

## 2017-04-30 DIAGNOSIS — J9611 Chronic respiratory failure with hypoxia: Secondary | ICD-10-CM | POA: Diagnosis not present

## 2017-04-30 DIAGNOSIS — I110 Hypertensive heart disease with heart failure: Secondary | ICD-10-CM | POA: Diagnosis not present

## 2017-05-01 DIAGNOSIS — I739 Peripheral vascular disease, unspecified: Secondary | ICD-10-CM | POA: Diagnosis not present

## 2017-05-01 DIAGNOSIS — M2040 Other hammer toe(s) (acquired), unspecified foot: Secondary | ICD-10-CM | POA: Diagnosis not present

## 2017-05-01 DIAGNOSIS — L84 Corns and callosities: Secondary | ICD-10-CM | POA: Diagnosis not present

## 2017-05-01 DIAGNOSIS — B351 Tinea unguium: Secondary | ICD-10-CM | POA: Diagnosis not present

## 2017-05-01 DIAGNOSIS — L605 Yellow nail syndrome: Secondary | ICD-10-CM | POA: Diagnosis not present

## 2017-05-05 DIAGNOSIS — M6281 Muscle weakness (generalized): Secondary | ICD-10-CM | POA: Diagnosis not present

## 2017-05-05 DIAGNOSIS — Z9981 Dependence on supplemental oxygen: Secondary | ICD-10-CM | POA: Diagnosis not present

## 2017-05-05 DIAGNOSIS — J449 Chronic obstructive pulmonary disease, unspecified: Secondary | ICD-10-CM | POA: Diagnosis not present

## 2017-05-05 DIAGNOSIS — I951 Orthostatic hypotension: Secondary | ICD-10-CM | POA: Diagnosis not present

## 2017-05-05 DIAGNOSIS — F419 Anxiety disorder, unspecified: Secondary | ICD-10-CM | POA: Diagnosis not present

## 2017-05-05 DIAGNOSIS — I5023 Acute on chronic systolic (congestive) heart failure: Secondary | ICD-10-CM | POA: Diagnosis not present

## 2017-05-05 DIAGNOSIS — R531 Weakness: Secondary | ICD-10-CM | POA: Diagnosis not present

## 2017-05-06 DIAGNOSIS — I5023 Acute on chronic systolic (congestive) heart failure: Secondary | ICD-10-CM | POA: Diagnosis not present

## 2017-05-06 DIAGNOSIS — I350 Nonrheumatic aortic (valve) stenosis: Secondary | ICD-10-CM | POA: Diagnosis not present

## 2017-05-06 DIAGNOSIS — I110 Hypertensive heart disease with heart failure: Secondary | ICD-10-CM | POA: Diagnosis not present

## 2017-05-06 DIAGNOSIS — J9611 Chronic respiratory failure with hypoxia: Secondary | ICD-10-CM | POA: Diagnosis not present

## 2017-05-06 DIAGNOSIS — I5032 Chronic diastolic (congestive) heart failure: Secondary | ICD-10-CM | POA: Diagnosis not present

## 2017-05-06 DIAGNOSIS — J449 Chronic obstructive pulmonary disease, unspecified: Secondary | ICD-10-CM | POA: Diagnosis not present

## 2017-05-08 DIAGNOSIS — F331 Major depressive disorder, recurrent, moderate: Secondary | ICD-10-CM | POA: Diagnosis not present

## 2017-05-08 DIAGNOSIS — F411 Generalized anxiety disorder: Secondary | ICD-10-CM | POA: Diagnosis not present

## 2017-05-09 DIAGNOSIS — I110 Hypertensive heart disease with heart failure: Secondary | ICD-10-CM | POA: Diagnosis not present

## 2017-05-09 DIAGNOSIS — I5032 Chronic diastolic (congestive) heart failure: Secondary | ICD-10-CM | POA: Diagnosis not present

## 2017-05-09 DIAGNOSIS — J449 Chronic obstructive pulmonary disease, unspecified: Secondary | ICD-10-CM | POA: Diagnosis not present

## 2017-05-09 DIAGNOSIS — I350 Nonrheumatic aortic (valve) stenosis: Secondary | ICD-10-CM | POA: Diagnosis not present

## 2017-05-09 DIAGNOSIS — I5023 Acute on chronic systolic (congestive) heart failure: Secondary | ICD-10-CM | POA: Diagnosis not present

## 2017-05-09 DIAGNOSIS — J9611 Chronic respiratory failure with hypoxia: Secondary | ICD-10-CM | POA: Diagnosis not present

## 2017-05-10 DIAGNOSIS — J449 Chronic obstructive pulmonary disease, unspecified: Secondary | ICD-10-CM | POA: Diagnosis not present

## 2017-05-10 DIAGNOSIS — J9611 Chronic respiratory failure with hypoxia: Secondary | ICD-10-CM | POA: Diagnosis not present

## 2017-05-10 DIAGNOSIS — I350 Nonrheumatic aortic (valve) stenosis: Secondary | ICD-10-CM | POA: Diagnosis not present

## 2017-05-10 DIAGNOSIS — I5032 Chronic diastolic (congestive) heart failure: Secondary | ICD-10-CM | POA: Diagnosis not present

## 2017-05-10 DIAGNOSIS — I5023 Acute on chronic systolic (congestive) heart failure: Secondary | ICD-10-CM | POA: Diagnosis not present

## 2017-05-10 DIAGNOSIS — I110 Hypertensive heart disease with heart failure: Secondary | ICD-10-CM | POA: Diagnosis not present

## 2017-05-12 DIAGNOSIS — H01009 Unspecified blepharitis unspecified eye, unspecified eyelid: Secondary | ICD-10-CM | POA: Diagnosis not present

## 2017-05-12 DIAGNOSIS — M79652 Pain in left thigh: Secondary | ICD-10-CM | POA: Diagnosis not present

## 2017-05-12 DIAGNOSIS — J449 Chronic obstructive pulmonary disease, unspecified: Secondary | ICD-10-CM | POA: Diagnosis not present

## 2017-05-12 DIAGNOSIS — J9610 Chronic respiratory failure, unspecified whether with hypoxia or hypercapnia: Secondary | ICD-10-CM | POA: Diagnosis not present

## 2017-05-12 DIAGNOSIS — Z23 Encounter for immunization: Secondary | ICD-10-CM | POA: Diagnosis not present

## 2017-05-12 DIAGNOSIS — I5020 Unspecified systolic (congestive) heart failure: Secondary | ICD-10-CM | POA: Diagnosis not present

## 2017-05-12 DIAGNOSIS — M779 Enthesopathy, unspecified: Secondary | ICD-10-CM | POA: Diagnosis not present

## 2017-05-12 DIAGNOSIS — F4322 Adjustment disorder with anxiety: Secondary | ICD-10-CM | POA: Diagnosis not present

## 2017-05-13 DIAGNOSIS — J449 Chronic obstructive pulmonary disease, unspecified: Secondary | ICD-10-CM | POA: Diagnosis not present

## 2017-05-13 DIAGNOSIS — I350 Nonrheumatic aortic (valve) stenosis: Secondary | ICD-10-CM | POA: Diagnosis not present

## 2017-05-13 DIAGNOSIS — I5023 Acute on chronic systolic (congestive) heart failure: Secondary | ICD-10-CM | POA: Diagnosis not present

## 2017-05-13 DIAGNOSIS — I110 Hypertensive heart disease with heart failure: Secondary | ICD-10-CM | POA: Diagnosis not present

## 2017-05-13 DIAGNOSIS — J9611 Chronic respiratory failure with hypoxia: Secondary | ICD-10-CM | POA: Diagnosis not present

## 2017-05-13 DIAGNOSIS — I5032 Chronic diastolic (congestive) heart failure: Secondary | ICD-10-CM | POA: Diagnosis not present

## 2017-05-14 DIAGNOSIS — R42 Dizziness and giddiness: Secondary | ICD-10-CM | POA: Diagnosis not present

## 2017-05-14 DIAGNOSIS — J449 Chronic obstructive pulmonary disease, unspecified: Secondary | ICD-10-CM | POA: Diagnosis not present

## 2017-05-14 DIAGNOSIS — K219 Gastro-esophageal reflux disease without esophagitis: Secondary | ICD-10-CM | POA: Diagnosis not present

## 2017-05-14 DIAGNOSIS — F419 Anxiety disorder, unspecified: Secondary | ICD-10-CM | POA: Diagnosis not present

## 2017-05-14 DIAGNOSIS — R269 Unspecified abnormalities of gait and mobility: Secondary | ICD-10-CM | POA: Diagnosis not present

## 2017-05-14 DIAGNOSIS — I509 Heart failure, unspecified: Secondary | ICD-10-CM | POA: Diagnosis not present

## 2017-05-14 DIAGNOSIS — I110 Hypertensive heart disease with heart failure: Secondary | ICD-10-CM | POA: Diagnosis not present

## 2017-05-14 DIAGNOSIS — I4891 Unspecified atrial fibrillation: Secondary | ICD-10-CM | POA: Diagnosis not present

## 2017-05-15 DIAGNOSIS — I5032 Chronic diastolic (congestive) heart failure: Secondary | ICD-10-CM | POA: Diagnosis not present

## 2017-05-15 DIAGNOSIS — F411 Generalized anxiety disorder: Secondary | ICD-10-CM | POA: Diagnosis not present

## 2017-05-15 DIAGNOSIS — J449 Chronic obstructive pulmonary disease, unspecified: Secondary | ICD-10-CM | POA: Diagnosis not present

## 2017-05-15 DIAGNOSIS — I110 Hypertensive heart disease with heart failure: Secondary | ICD-10-CM | POA: Diagnosis not present

## 2017-05-15 DIAGNOSIS — I350 Nonrheumatic aortic (valve) stenosis: Secondary | ICD-10-CM | POA: Diagnosis not present

## 2017-05-15 DIAGNOSIS — I5023 Acute on chronic systolic (congestive) heart failure: Secondary | ICD-10-CM | POA: Diagnosis not present

## 2017-05-15 DIAGNOSIS — J9611 Chronic respiratory failure with hypoxia: Secondary | ICD-10-CM | POA: Diagnosis not present

## 2017-05-15 DIAGNOSIS — F339 Major depressive disorder, recurrent, unspecified: Secondary | ICD-10-CM | POA: Diagnosis not present

## 2017-05-16 DIAGNOSIS — I110 Hypertensive heart disease with heart failure: Secondary | ICD-10-CM | POA: Diagnosis not present

## 2017-05-16 DIAGNOSIS — I5023 Acute on chronic systolic (congestive) heart failure: Secondary | ICD-10-CM | POA: Diagnosis not present

## 2017-05-16 DIAGNOSIS — I5032 Chronic diastolic (congestive) heart failure: Secondary | ICD-10-CM | POA: Diagnosis not present

## 2017-05-16 DIAGNOSIS — J449 Chronic obstructive pulmonary disease, unspecified: Secondary | ICD-10-CM | POA: Diagnosis not present

## 2017-05-16 DIAGNOSIS — J9611 Chronic respiratory failure with hypoxia: Secondary | ICD-10-CM | POA: Diagnosis not present

## 2017-05-16 DIAGNOSIS — I350 Nonrheumatic aortic (valve) stenosis: Secondary | ICD-10-CM | POA: Diagnosis not present

## 2017-05-17 DIAGNOSIS — I110 Hypertensive heart disease with heart failure: Secondary | ICD-10-CM | POA: Diagnosis not present

## 2017-05-17 DIAGNOSIS — I350 Nonrheumatic aortic (valve) stenosis: Secondary | ICD-10-CM | POA: Diagnosis not present

## 2017-05-17 DIAGNOSIS — I5023 Acute on chronic systolic (congestive) heart failure: Secondary | ICD-10-CM | POA: Diagnosis not present

## 2017-05-17 DIAGNOSIS — J9611 Chronic respiratory failure with hypoxia: Secondary | ICD-10-CM | POA: Diagnosis not present

## 2017-05-17 DIAGNOSIS — J449 Chronic obstructive pulmonary disease, unspecified: Secondary | ICD-10-CM | POA: Diagnosis not present

## 2017-05-17 DIAGNOSIS — I5032 Chronic diastolic (congestive) heart failure: Secondary | ICD-10-CM | POA: Diagnosis not present

## 2017-05-18 ENCOUNTER — Emergency Department (HOSPITAL_COMMUNITY)
Admission: EM | Admit: 2017-05-18 | Discharge: 2017-05-18 | Disposition: A | Discharge status: Home / Self Care | Payer: Medicare Other | Attending: Emergency Medicine | Admitting: Emergency Medicine

## 2017-05-18 ENCOUNTER — Emergency Department (HOSPITAL_COMMUNITY): Payer: Medicare Other

## 2017-05-18 ENCOUNTER — Encounter (HOSPITAL_COMMUNITY): Payer: Self-pay | Admitting: Emergency Medicine

## 2017-05-18 DIAGNOSIS — R404 Transient alteration of awareness: Secondary | ICD-10-CM | POA: Diagnosis not present

## 2017-05-18 DIAGNOSIS — J449 Chronic obstructive pulmonary disease, unspecified: Secondary | ICD-10-CM | POA: Insufficient documentation

## 2017-05-18 DIAGNOSIS — R4182 Altered mental status, unspecified: Secondary | ICD-10-CM | POA: Diagnosis not present

## 2017-05-18 DIAGNOSIS — Z79899 Other long term (current) drug therapy: Secondary | ICD-10-CM | POA: Diagnosis not present

## 2017-05-18 DIAGNOSIS — Z66 Do not resuscitate: Secondary | ICD-10-CM | POA: Diagnosis not present

## 2017-05-18 DIAGNOSIS — R531 Weakness: Secondary | ICD-10-CM | POA: Diagnosis not present

## 2017-05-18 DIAGNOSIS — J45909 Unspecified asthma, uncomplicated: Secondary | ICD-10-CM | POA: Insufficient documentation

## 2017-05-18 DIAGNOSIS — Z8673 Personal history of transient ischemic attack (TIA), and cerebral infarction without residual deficits: Secondary | ICD-10-CM | POA: Diagnosis not present

## 2017-05-18 DIAGNOSIS — N390 Urinary tract infection, site not specified: Secondary | ICD-10-CM | POA: Diagnosis not present

## 2017-05-18 DIAGNOSIS — Z85828 Personal history of other malignant neoplasm of skin: Secondary | ICD-10-CM | POA: Diagnosis not present

## 2017-05-18 DIAGNOSIS — I119 Hypertensive heart disease without heart failure: Secondary | ICD-10-CM | POA: Diagnosis not present

## 2017-05-18 DIAGNOSIS — R5383 Other fatigue: Secondary | ICD-10-CM | POA: Diagnosis not present

## 2017-05-18 DIAGNOSIS — M6281 Muscle weakness (generalized): Secondary | ICD-10-CM | POA: Diagnosis not present

## 2017-05-18 LAB — POC OCCULT BLOOD, ED: Fecal Occult Bld: NEGATIVE

## 2017-05-18 LAB — COMPREHENSIVE METABOLIC PANEL
ALK PHOS: 81 U/L (ref 38–126)
ALT: 13 U/L — ABNORMAL LOW (ref 14–54)
ANION GAP: 5 (ref 5–15)
AST: 18 U/L (ref 15–41)
Albumin: 2.1 g/dL — ABNORMAL LOW (ref 3.5–5.0)
BILIRUBIN TOTAL: 0.6 mg/dL (ref 0.3–1.2)
BUN: 22 mg/dL — ABNORMAL HIGH (ref 6–20)
CALCIUM: 8.2 mg/dL — AB (ref 8.9–10.3)
CO2: 26 mmol/L (ref 22–32)
Chloride: 107 mmol/L (ref 101–111)
Creatinine, Ser: 0.83 mg/dL (ref 0.44–1.00)
GFR calc Af Amer: >60 mL/min (ref >60)
GLUCOSE: 112 mg/dL — AB (ref 65–99)
POTASSIUM: 4 mmol/L (ref 3.5–5.1)
Sodium: 138 mmol/L (ref 135–145)
TOTAL PROTEIN: 5.9 g/dL — AB (ref 6.5–8.1)

## 2017-05-18 LAB — I-STAT CG4 LACTIC ACID, ED: Lactic Acid, Venous: 1.01 mmol/L (ref 0.5–1.9)

## 2017-05-18 LAB — URINALYSIS, ROUTINE W REFLEX MICROSCOPIC
BILIRUBIN URINE: NEGATIVE
GLUCOSE, UA: NEGATIVE mg/dL
Ketones, ur: NEGATIVE mg/dL
NITRITE: POSITIVE — AB
PH: 5 (ref 5.0–8.0)
Protein, ur: NEGATIVE mg/dL
SPECIFIC GRAVITY, URINE: 1.014 (ref 1.005–1.030)

## 2017-05-18 LAB — CBC WITH DIFFERENTIAL/PLATELET
Basophils Absolute: 0 10*3/uL (ref 0.0–0.1)
Basophils Relative: 0 %
Eosinophils Absolute: 0.5 10*3/uL (ref 0.0–0.7)
Eosinophils Relative: 7 %
HEMATOCRIT: 33.9 % — AB (ref 36.0–46.0)
HEMOGLOBIN: 10.8 g/dL — AB (ref 12.0–15.0)
LYMPHS ABS: 0.8 10*3/uL (ref 0.7–4.0)
LYMPHS PCT: 12 %
MCH: 31.7 pg (ref 26.0–34.0)
MCHC: 31.9 g/dL (ref 30.0–36.0)
MCV: 99.4 fL (ref 78.0–100.0)
MONO ABS: 0.7 10*3/uL (ref 0.1–1.0)
MONOS PCT: 11 %
NEUTROS ABS: 4.7 10*3/uL (ref 1.7–7.7)
NEUTROS PCT: 70 %
Platelets: 263 10*3/uL (ref 150–400)
RBC: 3.41 MIL/uL — ABNORMAL LOW (ref 3.87–5.11)
RDW: 14.9 % (ref 11.5–15.5)
WBC: 6.8 10*3/uL (ref 4.0–10.5)

## 2017-05-18 LAB — TROPONIN I: Troponin I: 0.05 ng/mL (ref <0.03)

## 2017-05-18 MED ORDER — SODIUM CHLORIDE 0.9 % IV SOLN
INTRAVENOUS | Status: DC
Start: 2017-05-18 — End: 2017-05-19
  Administered 2017-05-18: 18:00:00 via INTRAVENOUS

## 2017-05-18 MED ORDER — IPRATROPIUM-ALBUTEROL 0.5-2.5 (3) MG/3ML IN SOLN
3.0000 mL | Freq: Once | RESPIRATORY_TRACT | Status: AC
  Administered 2017-05-18: 3 mL via RESPIRATORY_TRACT
  Filled 2017-05-18: qty 3

## 2017-05-18 MED ORDER — DEXTROSE 5 % IV SOLN
1.0000 g | Freq: Once | INTRAVENOUS | Status: AC
  Administered 2017-05-18: 1 g via INTRAVENOUS
  Filled 2017-05-18: qty 10

## 2017-05-18 MED ORDER — CEPHALEXIN 500 MG PO CAPS: 500.0000 mg | ORAL_CAPSULE | Freq: Two times a day (BID) | ORAL | 0 refills | Status: AC

## 2017-05-18 NOTE — Discharge Instructions (Signed)
Your testing showed that you have some anemia (low blood counts) and a urinary tract infection - please take Keflex 500mg  by mouth twice daily for 7 days, and have your doctor recheck your blood counts in 2 weeks - you should return to the ER for increased shortness of breath, coughing, fevers, vomiting or increased weakness.

## 2017-05-18 NOTE — ED Triage Notes (Signed)
Pt arrives EMS from [facility] with complaints of weakness. Staff reports that pt was leaning to her right side. EMS reported no unilateral deficits. EMS reports bp 82/60 and 116/80 after 500 cc NS given. Pt has hx of CVA, falls, and A-fib and on eliquis.

## 2017-05-18 NOTE — ED Provider Notes (Signed)
[facility] EMERGENCY DEPARTMENT Provider Note   CSN: [id number] Arrival date & time: 05/18/17  1745     History   Chief Complaint Chief Complaint  Patient presents with  . Weakness    HPI Caroline Myers is a 83 y.o. female.  HPI  The patient is a 83 year old female, she [facility] from [facility] she is a resident, she has a known history of COPD, congestive heart failure and has had stress test in the past with normal coronary arteries.  The patient does have severe aortic stenosis.  Of note the patient has been having a gradual decline over the last year since she lost her husband of 77 years of marriage.  Her daughter came to visit her from [place] and took her out yesterday, it seemed to be a normal day with a normal level of alertness, no generalized weakness, she was in a wheelchair.  She went back to the nursing home and today when she went back to see her she was extremely fatigued and weak and somnolent but easily arousable.  The patient denies focal pain, nausea, vomiting, diarrhea, fevers, chills coughing or shortness of breath.  She just has severe fatigue.  There is a physician that makes house calls that went to her assisted care facility and recommended that she come to the emergency department and likely be admitted for her generalized weakness.  There is no further information at this time.  The paramedics did report that she was hypotensive to 80 systolic.  Past Medical History:  Diagnosis Date  . Allergic rhinitis   . Anxiety   . Arthritis    "neck, spine, hands" (06/28/2016)  . Asthma 2000   mild. tried Advair after bronchospasm after exposure to cats  . Atrial flutter ([place])   . Chronic combined systolic and diastolic CHF (congestive heart failure) ([place])   . COPD (chronic obstructive pulmonary disease) ([place])   . GERD (gastroesophageal reflux disease)    occ  . Intracranial hemorrhage following injury ([place])  01/03/2015  . Normal coronary arteries    a. underwent treadmill stress test in April 2008 which shows normal perfusion study, however she had chest pain and EKG changes during the terminal portion concerning for false negative study. She had normal coronaries on cardiac catheterization in May 2008.   Marland Kitchen Permanent atrial fibrillation (HCC)    pt does not want anticoagulation  . Pneumonia 07/2015  . Scoliosis   . Severe aortic stenosis   . Shoulder pain, left 05/2010   with rotator cuff tear--Dr Alfonso Ramus  . Skin cancer of nose    "MOHS; think it was basal"  . Squamous carcinoma    "burned off my face"  . Stroke [facility]) 1/ 2014; 12/2014   denies residual on 06/28/2016    Patient Active Problem List   Diagnosis Date Noted  . Acute on chronic systolic heart failure, NYHA class 3 ([place]) 04/21/2017  . Severe aortic valve stenosis 04/21/2017  . Chronic respiratory failure with hypoxia ([place]) 04/21/2017  . Chronic atrial fibrillation ([place]) 04/21/2017  . Moderate to severe pulmonary hypertension ([place]) 04/21/2017  . COPD (chronic obstructive pulmonary disease) ([place]) 04/21/2017  . Right-sided epistaxis 12/13/2016  . Intracranial hemorrhage ([place]) 12/09/2016  . Hospice care patient 12/09/2016  . Multiple contusions 12/09/2016  . Nasal fracture 12/09/2016  . AC separation, right, initial encounter 12/09/2016  . Fall at home, initial encounter 12/09/2016  . Chronic combined systolic and diastolic CHF (congestive heart  failure) ([place]) 09/19/2016  . Chest pain 08/29/2016  . Concussion 07/06/2016  . CHF exacerbation ([place]) 06/28/2016  . Respiratory failure with hypoxia ([place]) 06/28/2016  . URI (upper respiratory infection) 06/28/2016  . Chest pressure 06/28/2016  . Oral dyskinesia 06/28/2016  . Falls 05/20/2016  . Pulmonary hypertension ([place]) 03/15/2016  . Anxiety about health   . Cough   . Hypokalemia   . AKI (acute kidney injury) ([place])   . Debility 07/20/2015  . Sepsis ([place]) 07/20/2015  .  History of CVA (cerebrovascular accident)   . Tachypnea   . Leukocytosis 07/17/2015  . Arterial hypotension   . Hypotension 07/16/2015  . Elevated troponin 07/16/2015  . PNA (pneumonia)   . Severe sepsis ([place]) 07/15/2015  . Acute respiratory failure with hypercapnia ([place]) 07/15/2015  . Chronic anticoagulation 04/18/2015  . Orthostatic hypotension 03/09/2015  . CVA (cerebral infarction) 02/14/2015  . Stroke ([place]) 02/14/2015  . Stroke with cerebral ischemia ([place])   . Cerebral infarction due to embolism of left middle cerebral artery ([place])   . Occipital fracture ([place])   . Benign essential HTN   . Other allergic rhinitis   . Depression   . SAH (subarachnoid hemorrhage) ([place]) 01/02/2015  . SDH (subdural hematoma) ([place]) 01/02/2015  . Aortic atherosclerosis ([place]) 08/05/2014  . Nonspecific abnormal unspecified cardiovascular function study 10/28/2013  . Severe aortic stenosis 10/28/2013  . HLD (hyperlipidemia) 07/28/2013  . TIA (transient ischemic attack) 07/27/2013  . History of CHF (congestive heart failure) 07/27/2013  . Urticaria 11/06/2010  . Bronchitis 10/12/2010  . Preventative health care   . Anxiety state 03/29/2010  . RIB PAIN, LEFT SIDED 10/12/2009  . ALLERGIC RHINITIS 06/28/2009  . SHOULDER PAIN, LEFT 06/28/2009  . SCIATICA, RIGHT 06/28/2009  . HEADACHE 06/28/2009  . DYSPNEA ON EXERTION 06/28/2009  . SKIN CANCER, HX OF 06/28/2009    Past Surgical History:  Procedure Laterality Date  . CATARACT EXTRACTION W/ INTRAOCULAR LENS  IMPLANT, BILATERAL Bilateral   . DILATION AND CURETTAGE OF UTERUS    . HERNIA REPAIR  2003  . LUMBAR LAMINECTOMY/DECOMPRESSION MICRODISCECTOMY 1 LEVEL Right 04/01/2012   Performed by Eustace Moore, MD at [facility] NEURO ORS  . MOHS SURGERY     "nose"  . SHOULDER ARTHROSCOPY Right 2000   ACROMIOPLASTY  . TONSILLECTOMY      OB History    No data available       Home Medications    Prior to Admission medications   Medication Sig Start Date  End Date Taking? Authorizing Provider  acetaminophen (TYLENOL) 325 MG tablet Take 650 mg by mouth every 4 (four) hours as needed for mild pain or fever.   Yes [provider]  acetaminophen (TYLENOL) 500 MG tablet Take 500 mg 2 (two) times daily by mouth. For 10 days. Started on 05-14-17 @@ 2000   Yes [provider]  acetaminophen (TYLENOL) 650 MG suppository Place 650 mg rectally every 4 (four) hours as needed for mild pain or fever.   Yes [provider]  ALPRAZolam (XANAX) 0.25 MG tablet Take 0.25 mg by mouth 2 (two) times daily.    Yes [provider]  apixaban (ELIQUIS) 2.5 MG TABS tablet Take 2.5 mg by mouth 2 (two) times daily.   Yes [provider]  furosemide (LASIX) 20 MG tablet Take 0.5 tablets (10 mg total) by mouth daily. 12/15/16  Yes Verner Mould, MD  ipratropium-albuterol (DUONEB) 0.5-2.5 (3) MG/3ML SOLN Take 3 mLs by nebulization 3 (three) times daily.  11/14/16  Yes [provider]  ipratropium-albuterol (DUONEB) 0.5-2.5 (3) MG/3ML SOLN Take 3 mLs by nebulization every 4 (four) hours as needed (for shortness of breath).   Yes [provider]  oxymetazoline (AFRIN) 0.05 % nasal spray Place 2 sprays into both nostrils 2 (two) times daily as needed for congestion.   Yes [provider]  polyvinyl alcohol (LIQUIFILM TEARS) 1.4 % ophthalmic solution Place 1 drop into both eyes 4 (four) times daily.   Yes [provider]  potassium chloride (K-DUR) 10 MEQ tablet Take 1 tablet (10 mEq total) by mouth daily. 08/21/16  Yes Jettie Booze, MD  sertraline (ZOLOFT) 50 MG tablet Take 1 tablet (50 mg total) by mouth daily. Patient taking differently: Take 50 mg 2 (two) times daily by mouth.  07/28/15  Yes Angiulli, Lavon Paganini, PA-C  spironolactone (ALDACTONE) 25 MG tablet Take 12.5 mg by mouth every Monday, Wednesday, and Friday.  10/09/16  Yes [provider]  cephALEXin (KEFLEX) 500 MG capsule  Take 1 capsule (500 mg total) 2 (two) times daily for 7 days by mouth. 05/18/17 05/25/17  Noemi Chapel, MD  PROAIR HFA 108 [phone] Base) MCG/ACT inhaler Inhale 2 puffs into the lungs every 4 (four) hours as needed for wheezing or shortness of breath. Patient not taking: Reported on 05/18/2017 06/30/16   Jonetta Osgood, MD    Family History Family History  Problem Relation Age of Onset  . Heart failure Father   . Stroke Sister   . Heart attack Neg Hx   . Hypertension Neg Hx     Social History Social History   Tobacco Use  . Smoking status: Never Smoker  . Smokeless tobacco: Never Used  Substance Use Topics  . Alcohol use: Yes    Alcohol/week: 0.0 oz    Comment: 06/28/2016 "moved to assisted living 06/18/2016; nothing since then"  . Drug use: No     Allergies   Tape and Verapamil   Review of Systems Review of Systems  All other systems reviewed and are negative.    Physical Exam Updated Vital Signs BP 120/79   Pulse 79   Temp 97.8 F (36.6 C) (Oral)   Resp (!) 25   SpO2 96%   Physical Exam  Constitutional: She appears well-developed and well-nourished. No distress.  HENT:  Head: Normocephalic and atraumatic.  Mouth/Throat: Oropharynx is clear and moist. No oropharyngeal exudate.  Eyes: Conjunctivae and EOM are normal. Pupils are equal, round, and reactive to light. Right eye exhibits no discharge. Left eye exhibits no discharge. No scleral icterus.  Neck: Normal range of motion. Neck supple. No JVD present. No thyromegaly present.  Cardiovascular: Normal rate and regular rhythm. Exam reveals no gallop and no friction rub.  Murmur ( systolic) heard. Weak peripheral pulses at the radial arteries, no JVD, no edema  Pulmonary/Chest: Effort normal. No respiratory distress. She has no wheezes. She has rales ( Subtle rales at the bases that cleared with deep breathing, no increased work of breathing).  Abdominal: Soft. Bowel sounds are normal. She exhibits no  distension and no mass. There is no tenderness.  Genitourinary:  Genitourinary Comments: Chaperone present Has normal appearing anus / rectum, no blood, no masses, formed brown stool  Musculoskeletal: Normal range of motion. She exhibits no edema or tenderness.  Lymphadenopathy:    She has no cervical adenopathy.  Neurological: She is alert. Coordination normal.  The patient has a slight somnolence and generalized fatigue and weakness but has no  slurred speech, no facial droop and is able to sit up with me in the bed, normal strength in her arms and her legs when isolated but overall generally weak.  Skin: Skin is warm and dry. No rash noted. No erythema.  Psychiatric: She has a normal mood and affect. Her behavior is normal.  Nursing note and vitals reviewed.    ED Treatments / Results  Labs (all labs ordered are listed, but only abnormal results are displayed) Labs Reviewed  CBC WITH DIFFERENTIAL/PLATELET - Abnormal; Notable for the following components:      Result Value   RBC 3.41 (*)    Hemoglobin 10.8 (*)    HCT 33.9 (*)    All other components within normal limits  COMPREHENSIVE METABOLIC PANEL - Abnormal; Notable for the following components:   Glucose, Bld 112 (*)    BUN 22 (*)    Calcium 8.2 (*)    Total Protein 5.9 (*)    Albumin 2.1 (*)    ALT 13 (*)    All other components within normal limits  URINALYSIS, ROUTINE W REFLEX MICROSCOPIC - Abnormal; Notable for the following components:   APPearance HAZY (*)    Hgb urine dipstick MODERATE (*)    Nitrite POSITIVE (*)    Leukocytes, UA LARGE (*)    Bacteria, UA MANY (*)    Squamous Epithelial / LPF 0-5 (*)    All other components within normal limits  TROPONIN I - Abnormal; Notable for the following components:   Troponin I 0.05 (*)    All other components within normal limits  CULTURE, BLOOD (ROUTINE X 2)  CULTURE, BLOOD (ROUTINE X 2)  URINE CULTURE  OCCULT BLOOD X 1 CARD TO LAB, STOOL  I-STAT CG4 LACTIC ACID,  ED  POC OCCULT BLOOD, ED    EKG  EKG Interpretation  Date/Time:  Sunday May 18 2017 18:22:13 EST Ventricular Rate:  80 PR Interval:    QRS Duration: 92 QT Interval:  419 QTC Calculation: 484 R Axis:   -69 Text Interpretation:  atrial flutter with variable block likely  Nonspecific ST abnormality Left anterior fascicular block Anteroseptal infarct, age indeterminate Baseline wander in lead(s) V3 Since last tracing rate slower Confirmed by Noemi Chapel [phone]) on 05/18/2017 8:14:23 PM       Radiology Ct Head Wo Contrast  Result Date: 05/18/2017 CLINICAL DATA:  83 y/o F; difficulty waking up, altered mental status. EXAM: CT HEAD WITHOUT CONTRAST TECHNIQUE: Contiguous axial images were obtained from the base of the skull through the vertex without intravenous contrast. COMPARISON:  12/09/2016 CT head.  03/08/2016 MRI head. FINDINGS: Brain: No evidence of acute infarction, hemorrhage, hydrocephalus, extra-axial collection or mass lesion/mass effect. Stable foci of hypoattenuation in periventricular and subcortical white matter compatible with moderate chronic microvascular ischemic changes. Moderate diffuse brain parenchymal volume loss small lucency in bilateral basal ganglia likely represent chronic lacunar infarctions or prominent perivascular spaces and are stable. Stable small chronic infarctions within bilateral cerebellar hemispheres. Prominent left frontal extra-axial space is stable from prior MRI where demonstrated CSF signal on all sequences, likely volume loss. Vascular: Calcific atherosclerosis of carotid siphons. Skull: Normal. Negative for fracture or focal lesion. Sinuses/Orbits: No acute finding. Other: None. IMPRESSION: 1. No acute intracranial abnormality identified. 2. Stable moderate chronic microvascular ischemic changes and parenchymal volume loss of the brain. Electronically Signed   By: Kristine Garbe M.D.   On: 05/18/2017 19:29   Dg Chest Port 1  View  Result Date: 05/18/2017 CLINICAL  DATA:  Weakness EXAM: PORTABLE CHEST 1 VIEW COMPARISON:  04/21/2017 FINDINGS: Cardiac shadow remains enlarged. Aortic calcifications are again seen. Diffuse interstitial changes are again identified without focal infiltrate or sizable effusion. Chronic changes about shoulder joints are noted. No acute abnormality is seen. IMPRESSION: Chronic interstitial changes without acute abnormality. Electronically Signed   By: Inez Catalina M.D.   On: 05/18/2017 19:41    Procedures Procedures (including critical care time)  Medications Ordered in ED Medications  0.9 %  sodium chloride infusion ( Intravenous New Bag/Given 05/18/17 1822)  cefTRIAXone (ROCEPHIN) 1 g in dextrose 5 % 50 mL IVPB (1 g Intravenous New Bag/Given 05/18/17 2207)     Initial Impression / Assessment and Plan / ED Course  I have reviewed the triage vital signs and the nursing notes.  Pertinent labs & imaging results that were available during my care of the patient were reviewed by me and considered in my medical decision making (see chart for details).     Patient has evidence of a urinary tract infection on her labs, her blood counts are normal, she has slightly low hemoglobin at 10.8, her Hemoccult stool was negative, metabolic panel was unremarkable, I discussed this with the patient and her daughter, she has received IV fluids and Rocephin and is well-appearing, alert, able to follow commands and appears comfortable for discharge back to her nursing facility.  Questions were answered, stable for discharge  Resp 24 on my exam - she is wheezing, she has COPD and has not had neb - prior to d/c neb ordered   Final Clinical Impressions(s) / ED Diagnoses   Final diagnoses:  Urinary tract infection without hematuria, site unspecified  Generalized weakness    ED Discharge Orders        Ordered    cephALEXin (KEFLEX) 500 MG capsule  2 times daily     05/18/17 2217       Noemi Chapel, MD 05/18/17 2226

## 2017-05-18 NOTE — ED Notes (Signed)
Patient transported to CT 

## 2017-05-19 DIAGNOSIS — I350 Nonrheumatic aortic (valve) stenosis: Secondary | ICD-10-CM | POA: Diagnosis not present

## 2017-05-19 DIAGNOSIS — I6782 Cerebral ischemia: Secondary | ICD-10-CM | POA: Diagnosis not present

## 2017-05-19 DIAGNOSIS — J449 Chronic obstructive pulmonary disease, unspecified: Secondary | ICD-10-CM | POA: Diagnosis not present

## 2017-05-19 DIAGNOSIS — I509 Heart failure, unspecified: Secondary | ICD-10-CM | POA: Diagnosis not present

## 2017-05-19 DIAGNOSIS — I5023 Acute on chronic systolic (congestive) heart failure: Secondary | ICD-10-CM | POA: Diagnosis not present

## 2017-05-19 DIAGNOSIS — J9611 Chronic respiratory failure with hypoxia: Secondary | ICD-10-CM | POA: Diagnosis not present

## 2017-05-19 DIAGNOSIS — D649 Anemia, unspecified: Secondary | ICD-10-CM | POA: Diagnosis not present

## 2017-05-19 DIAGNOSIS — I5032 Chronic diastolic (congestive) heart failure: Secondary | ICD-10-CM | POA: Diagnosis not present

## 2017-05-19 DIAGNOSIS — I110 Hypertensive heart disease with heart failure: Secondary | ICD-10-CM | POA: Diagnosis not present

## 2017-05-19 DIAGNOSIS — Z9981 Dependence on supplemental oxygen: Secondary | ICD-10-CM | POA: Diagnosis not present

## 2017-05-20 DIAGNOSIS — F05 Delirium due to known physiological condition: Secondary | ICD-10-CM | POA: Diagnosis not present

## 2017-05-20 DIAGNOSIS — F064 Anxiety disorder due to known physiological condition: Secondary | ICD-10-CM | POA: Diagnosis not present

## 2017-05-20 DIAGNOSIS — F411 Generalized anxiety disorder: Secondary | ICD-10-CM | POA: Diagnosis not present

## 2017-05-21 DIAGNOSIS — I350 Nonrheumatic aortic (valve) stenosis: Secondary | ICD-10-CM | POA: Diagnosis not present

## 2017-05-21 DIAGNOSIS — R531 Weakness: Secondary | ICD-10-CM | POA: Diagnosis not present

## 2017-05-21 DIAGNOSIS — J9611 Chronic respiratory failure with hypoxia: Secondary | ICD-10-CM | POA: Diagnosis not present

## 2017-05-21 DIAGNOSIS — I5023 Acute on chronic systolic (congestive) heart failure: Secondary | ICD-10-CM | POA: Diagnosis not present

## 2017-05-21 DIAGNOSIS — I110 Hypertensive heart disease with heart failure: Secondary | ICD-10-CM | POA: Diagnosis not present

## 2017-05-21 DIAGNOSIS — I5032 Chronic diastolic (congestive) heart failure: Secondary | ICD-10-CM | POA: Diagnosis not present

## 2017-05-21 DIAGNOSIS — J449 Chronic obstructive pulmonary disease, unspecified: Secondary | ICD-10-CM | POA: Diagnosis not present

## 2017-05-21 LAB — URINE CULTURE: Culture: >=100000 — AB

## 2017-05-22 ENCOUNTER — Telehealth: Payer: Self-pay | Admitting: *Deleted

## 2017-05-22 NOTE — Telephone Encounter (Signed)
Post ED Visit - Positive Culture Follow-up  Culture report reviewed by antimicrobial stewardship pharmacist:  []  Elenor Quinones, Pharm.D. []  Heide Guile, Pharm.D., BCPS AQ-ID []  Parks Neptune, Pharm.D., BCPS []  Alycia Rossetti, Pharm.D., BCPS []  [place], Pharm.D., BCPS, AAHIVP []  Legrand Como, Pharm.D., BCPS, AAHIVP [x]  Salome Arnt, PharmD, BCPS []  Dimitri Ped, PharmD, BCPS []  Vincenza Hews, PharmD, BCPS  Positive urine culture Treated with Cephalexin, organism sensitive to the same and no further patient follow-up is required at this time.  Harlon Flor [facility] 05/22/2017, 2:17 PM

## 2017-05-23 DIAGNOSIS — I5023 Acute on chronic systolic (congestive) heart failure: Secondary | ICD-10-CM | POA: Diagnosis not present

## 2017-05-23 DIAGNOSIS — I5032 Chronic diastolic (congestive) heart failure: Secondary | ICD-10-CM | POA: Diagnosis not present

## 2017-05-23 DIAGNOSIS — J9611 Chronic respiratory failure with hypoxia: Secondary | ICD-10-CM | POA: Diagnosis not present

## 2017-05-23 DIAGNOSIS — I350 Nonrheumatic aortic (valve) stenosis: Secondary | ICD-10-CM | POA: Diagnosis not present

## 2017-05-23 DIAGNOSIS — J449 Chronic obstructive pulmonary disease, unspecified: Secondary | ICD-10-CM | POA: Diagnosis not present

## 2017-05-23 DIAGNOSIS — I110 Hypertensive heart disease with heart failure: Secondary | ICD-10-CM | POA: Diagnosis not present

## 2017-05-23 LAB — CULTURE, BLOOD (ROUTINE X 2)
CULTURE: NO GROWTH
Culture: NO GROWTH
Special Requests: ADEQUATE

## 2017-05-26 DIAGNOSIS — J9610 Chronic respiratory failure, unspecified whether with hypoxia or hypercapnia: Secondary | ICD-10-CM | POA: Diagnosis not present

## 2017-05-26 DIAGNOSIS — I509 Heart failure, unspecified: Secondary | ICD-10-CM | POA: Diagnosis not present

## 2017-05-26 DIAGNOSIS — F064 Anxiety disorder due to known physiological condition: Secondary | ICD-10-CM | POA: Diagnosis not present

## 2017-05-26 DIAGNOSIS — J449 Chronic obstructive pulmonary disease, unspecified: Secondary | ICD-10-CM | POA: Diagnosis not present

## 2017-05-26 DIAGNOSIS — I2729 Other secondary pulmonary hypertension: Secondary | ICD-10-CM | POA: Diagnosis not present

## 2017-05-26 DIAGNOSIS — Z9981 Dependence on supplemental oxygen: Secondary | ICD-10-CM | POA: Diagnosis not present

## 2017-05-27 DIAGNOSIS — J9611 Chronic respiratory failure with hypoxia: Secondary | ICD-10-CM | POA: Diagnosis not present

## 2017-05-27 DIAGNOSIS — I350 Nonrheumatic aortic (valve) stenosis: Secondary | ICD-10-CM | POA: Diagnosis not present

## 2017-05-27 DIAGNOSIS — J449 Chronic obstructive pulmonary disease, unspecified: Secondary | ICD-10-CM | POA: Diagnosis not present

## 2017-05-27 DIAGNOSIS — I5023 Acute on chronic systolic (congestive) heart failure: Secondary | ICD-10-CM | POA: Diagnosis not present

## 2017-05-27 DIAGNOSIS — I5032 Chronic diastolic (congestive) heart failure: Secondary | ICD-10-CM | POA: Diagnosis not present

## 2017-05-27 DIAGNOSIS — I110 Hypertensive heart disease with heart failure: Secondary | ICD-10-CM | POA: Diagnosis not present

## 2017-05-28 DIAGNOSIS — I5032 Chronic diastolic (congestive) heart failure: Secondary | ICD-10-CM | POA: Diagnosis not present

## 2017-05-28 DIAGNOSIS — I350 Nonrheumatic aortic (valve) stenosis: Secondary | ICD-10-CM | POA: Diagnosis not present

## 2017-05-28 DIAGNOSIS — I5023 Acute on chronic systolic (congestive) heart failure: Secondary | ICD-10-CM | POA: Diagnosis not present

## 2017-05-28 DIAGNOSIS — J9611 Chronic respiratory failure with hypoxia: Secondary | ICD-10-CM | POA: Diagnosis not present

## 2017-05-28 DIAGNOSIS — J449 Chronic obstructive pulmonary disease, unspecified: Secondary | ICD-10-CM | POA: Diagnosis not present

## 2017-05-28 DIAGNOSIS — I110 Hypertensive heart disease with heart failure: Secondary | ICD-10-CM | POA: Diagnosis not present

## 2017-05-29 DIAGNOSIS — J449 Chronic obstructive pulmonary disease, unspecified: Secondary | ICD-10-CM | POA: Diagnosis not present

## 2017-05-29 DIAGNOSIS — I350 Nonrheumatic aortic (valve) stenosis: Secondary | ICD-10-CM | POA: Diagnosis not present

## 2017-05-29 DIAGNOSIS — F411 Generalized anxiety disorder: Secondary | ICD-10-CM | POA: Diagnosis not present

## 2017-05-29 DIAGNOSIS — J9611 Chronic respiratory failure with hypoxia: Secondary | ICD-10-CM | POA: Diagnosis not present

## 2017-05-29 DIAGNOSIS — I5023 Acute on chronic systolic (congestive) heart failure: Secondary | ICD-10-CM | POA: Diagnosis not present

## 2017-05-29 DIAGNOSIS — F339 Major depressive disorder, recurrent, unspecified: Secondary | ICD-10-CM | POA: Diagnosis not present

## 2017-05-29 DIAGNOSIS — I110 Hypertensive heart disease with heart failure: Secondary | ICD-10-CM | POA: Diagnosis not present

## 2017-05-29 DIAGNOSIS — I5032 Chronic diastolic (congestive) heart failure: Secondary | ICD-10-CM | POA: Diagnosis not present

## 2017-05-30 DIAGNOSIS — I5023 Acute on chronic systolic (congestive) heart failure: Secondary | ICD-10-CM | POA: Diagnosis not present

## 2017-05-30 DIAGNOSIS — I110 Hypertensive heart disease with heart failure: Secondary | ICD-10-CM | POA: Diagnosis not present

## 2017-05-30 DIAGNOSIS — J449 Chronic obstructive pulmonary disease, unspecified: Secondary | ICD-10-CM | POA: Diagnosis not present

## 2017-05-30 DIAGNOSIS — F0630 Mood disorder due to known physiological condition, unspecified: Secondary | ICD-10-CM | POA: Diagnosis not present

## 2017-05-30 DIAGNOSIS — F064 Anxiety disorder due to known physiological condition: Secondary | ICD-10-CM | POA: Diagnosis not present

## 2017-05-30 DIAGNOSIS — I350 Nonrheumatic aortic (valve) stenosis: Secondary | ICD-10-CM | POA: Diagnosis not present

## 2017-05-30 DIAGNOSIS — F411 Generalized anxiety disorder: Secondary | ICD-10-CM | POA: Diagnosis not present

## 2017-05-30 DIAGNOSIS — F05 Delirium due to known physiological condition: Secondary | ICD-10-CM | POA: Diagnosis not present

## 2017-05-30 DIAGNOSIS — J9611 Chronic respiratory failure with hypoxia: Secondary | ICD-10-CM | POA: Diagnosis not present

## 2017-05-30 DIAGNOSIS — I5032 Chronic diastolic (congestive) heart failure: Secondary | ICD-10-CM | POA: Diagnosis not present

## 2017-05-30 DIAGNOSIS — F339 Major depressive disorder, recurrent, unspecified: Secondary | ICD-10-CM | POA: Diagnosis not present

## 2017-06-02 DIAGNOSIS — F4320 Adjustment disorder, unspecified: Secondary | ICD-10-CM | POA: Diagnosis not present

## 2017-06-02 DIAGNOSIS — R5383 Other fatigue: Secondary | ICD-10-CM | POA: Diagnosis not present

## 2017-06-02 DIAGNOSIS — J449 Chronic obstructive pulmonary disease, unspecified: Secondary | ICD-10-CM | POA: Diagnosis not present

## 2017-06-02 DIAGNOSIS — I509 Heart failure, unspecified: Secondary | ICD-10-CM | POA: Diagnosis not present

## 2017-06-03 DIAGNOSIS — Z79899 Other long term (current) drug therapy: Secondary | ICD-10-CM | POA: Diagnosis not present

## 2017-06-03 DIAGNOSIS — I5023 Acute on chronic systolic (congestive) heart failure: Secondary | ICD-10-CM | POA: Diagnosis not present

## 2017-06-03 DIAGNOSIS — J449 Chronic obstructive pulmonary disease, unspecified: Secondary | ICD-10-CM | POA: Diagnosis not present

## 2017-06-03 DIAGNOSIS — J9611 Chronic respiratory failure with hypoxia: Secondary | ICD-10-CM | POA: Diagnosis not present

## 2017-06-03 DIAGNOSIS — I350 Nonrheumatic aortic (valve) stenosis: Secondary | ICD-10-CM | POA: Diagnosis not present

## 2017-06-03 DIAGNOSIS — I110 Hypertensive heart disease with heart failure: Secondary | ICD-10-CM | POA: Diagnosis not present

## 2017-06-03 DIAGNOSIS — I5032 Chronic diastolic (congestive) heart failure: Secondary | ICD-10-CM | POA: Diagnosis not present

## 2017-06-05 DIAGNOSIS — W19XXXA Unspecified fall, initial encounter: Secondary | ICD-10-CM | POA: Diagnosis not present

## 2017-06-05 DIAGNOSIS — J9611 Chronic respiratory failure with hypoxia: Secondary | ICD-10-CM | POA: Diagnosis not present

## 2017-06-05 DIAGNOSIS — I350 Nonrheumatic aortic (valve) stenosis: Secondary | ICD-10-CM | POA: Diagnosis not present

## 2017-06-05 DIAGNOSIS — I5023 Acute on chronic systolic (congestive) heart failure: Secondary | ICD-10-CM | POA: Diagnosis not present

## 2017-06-05 DIAGNOSIS — F339 Major depressive disorder, recurrent, unspecified: Secondary | ICD-10-CM | POA: Diagnosis not present

## 2017-06-05 DIAGNOSIS — I5032 Chronic diastolic (congestive) heart failure: Secondary | ICD-10-CM | POA: Diagnosis not present

## 2017-06-05 DIAGNOSIS — J449 Chronic obstructive pulmonary disease, unspecified: Secondary | ICD-10-CM | POA: Diagnosis not present

## 2017-06-05 DIAGNOSIS — I110 Hypertensive heart disease with heart failure: Secondary | ICD-10-CM | POA: Diagnosis not present

## 2017-06-05 DIAGNOSIS — F411 Generalized anxiety disorder: Secondary | ICD-10-CM | POA: Diagnosis not present

## 2017-06-05 DIAGNOSIS — R079 Chest pain, unspecified: Secondary | ICD-10-CM | POA: Diagnosis not present

## 2017-06-06 DIAGNOSIS — R531 Weakness: Secondary | ICD-10-CM | POA: Diagnosis not present

## 2017-06-07 DIAGNOSIS — S51822A Laceration with foreign body of left forearm, initial encounter: Secondary | ICD-10-CM | POA: Diagnosis not present

## 2017-06-07 DIAGNOSIS — S29099A Other injury of muscle and tendon of unspecified wall of thorax, initial encounter: Secondary | ICD-10-CM | POA: Diagnosis not present

## 2017-06-07 DIAGNOSIS — R0789 Other chest pain: Secondary | ICD-10-CM | POA: Diagnosis not present

## 2017-06-07 DIAGNOSIS — S51812A Laceration without foreign body of left forearm, initial encounter: Secondary | ICD-10-CM | POA: Diagnosis not present

## 2017-06-07 DIAGNOSIS — I509 Heart failure, unspecified: Secondary | ICD-10-CM | POA: Diagnosis not present

## 2017-06-07 DIAGNOSIS — R269 Unspecified abnormalities of gait and mobility: Secondary | ICD-10-CM | POA: Diagnosis not present

## 2017-06-07 DIAGNOSIS — F4322 Adjustment disorder with anxiety: Secondary | ICD-10-CM | POA: Diagnosis not present

## 2017-06-07 DIAGNOSIS — I951 Orthostatic hypotension: Secondary | ICD-10-CM | POA: Diagnosis not present

## 2017-06-10 DIAGNOSIS — I350 Nonrheumatic aortic (valve) stenosis: Secondary | ICD-10-CM | POA: Diagnosis not present

## 2017-06-10 DIAGNOSIS — J9611 Chronic respiratory failure with hypoxia: Secondary | ICD-10-CM | POA: Diagnosis not present

## 2017-06-10 DIAGNOSIS — I110 Hypertensive heart disease with heart failure: Secondary | ICD-10-CM | POA: Diagnosis not present

## 2017-06-10 DIAGNOSIS — I5032 Chronic diastolic (congestive) heart failure: Secondary | ICD-10-CM | POA: Diagnosis not present

## 2017-06-10 DIAGNOSIS — J449 Chronic obstructive pulmonary disease, unspecified: Secondary | ICD-10-CM | POA: Diagnosis not present

## 2017-06-10 DIAGNOSIS — I5023 Acute on chronic systolic (congestive) heart failure: Secondary | ICD-10-CM | POA: Diagnosis not present

## 2017-06-11 DIAGNOSIS — J309 Allergic rhinitis, unspecified: Secondary | ICD-10-CM | POA: Diagnosis not present

## 2017-06-11 DIAGNOSIS — M25511 Pain in right shoulder: Secondary | ICD-10-CM | POA: Diagnosis not present

## 2017-06-11 DIAGNOSIS — J449 Chronic obstructive pulmonary disease, unspecified: Secondary | ICD-10-CM | POA: Diagnosis not present

## 2017-06-11 DIAGNOSIS — K219 Gastro-esophageal reflux disease without esophagitis: Secondary | ICD-10-CM | POA: Diagnosis not present

## 2017-06-11 DIAGNOSIS — I4891 Unspecified atrial fibrillation: Secondary | ICD-10-CM | POA: Diagnosis not present

## 2017-06-11 DIAGNOSIS — R269 Unspecified abnormalities of gait and mobility: Secondary | ICD-10-CM | POA: Diagnosis not present

## 2017-06-11 DIAGNOSIS — R42 Dizziness and giddiness: Secondary | ICD-10-CM | POA: Diagnosis not present

## 2017-06-11 DIAGNOSIS — I110 Hypertensive heart disease with heart failure: Secondary | ICD-10-CM | POA: Diagnosis not present

## 2017-06-12 DIAGNOSIS — I350 Nonrheumatic aortic (valve) stenosis: Secondary | ICD-10-CM | POA: Diagnosis not present

## 2017-06-12 DIAGNOSIS — I5032 Chronic diastolic (congestive) heart failure: Secondary | ICD-10-CM | POA: Diagnosis not present

## 2017-06-12 DIAGNOSIS — J9611 Chronic respiratory failure with hypoxia: Secondary | ICD-10-CM | POA: Diagnosis not present

## 2017-06-12 DIAGNOSIS — I110 Hypertensive heart disease with heart failure: Secondary | ICD-10-CM | POA: Diagnosis not present

## 2017-06-12 DIAGNOSIS — I5023 Acute on chronic systolic (congestive) heart failure: Secondary | ICD-10-CM | POA: Diagnosis not present

## 2017-06-12 DIAGNOSIS — J449 Chronic obstructive pulmonary disease, unspecified: Secondary | ICD-10-CM | POA: Diagnosis not present

## 2017-06-16 DIAGNOSIS — R269 Unspecified abnormalities of gait and mobility: Secondary | ICD-10-CM | POA: Diagnosis not present

## 2017-06-16 DIAGNOSIS — S40812A Abrasion of left upper arm, initial encounter: Secondary | ICD-10-CM | POA: Diagnosis not present

## 2017-06-16 DIAGNOSIS — F418 Other specified anxiety disorders: Secondary | ICD-10-CM | POA: Diagnosis not present

## 2017-06-16 DIAGNOSIS — F411 Generalized anxiety disorder: Secondary | ICD-10-CM | POA: Diagnosis not present

## 2017-06-16 DIAGNOSIS — F064 Anxiety disorder due to known physiological condition: Secondary | ICD-10-CM | POA: Diagnosis not present

## 2017-06-16 DIAGNOSIS — J449 Chronic obstructive pulmonary disease, unspecified: Secondary | ICD-10-CM | POA: Diagnosis not present

## 2017-06-17 DIAGNOSIS — I5032 Chronic diastolic (congestive) heart failure: Secondary | ICD-10-CM | POA: Diagnosis not present

## 2017-06-17 DIAGNOSIS — J9611 Chronic respiratory failure with hypoxia: Secondary | ICD-10-CM | POA: Diagnosis not present

## 2017-06-17 DIAGNOSIS — J449 Chronic obstructive pulmonary disease, unspecified: Secondary | ICD-10-CM | POA: Diagnosis not present

## 2017-06-17 DIAGNOSIS — I350 Nonrheumatic aortic (valve) stenosis: Secondary | ICD-10-CM | POA: Diagnosis not present

## 2017-06-17 DIAGNOSIS — I5023 Acute on chronic systolic (congestive) heart failure: Secondary | ICD-10-CM | POA: Diagnosis not present

## 2017-06-17 DIAGNOSIS — I110 Hypertensive heart disease with heart failure: Secondary | ICD-10-CM | POA: Diagnosis not present

## 2017-06-19 DIAGNOSIS — I5032 Chronic diastolic (congestive) heart failure: Secondary | ICD-10-CM | POA: Diagnosis not present

## 2017-06-19 DIAGNOSIS — J9611 Chronic respiratory failure with hypoxia: Secondary | ICD-10-CM | POA: Diagnosis not present

## 2017-06-19 DIAGNOSIS — I5023 Acute on chronic systolic (congestive) heart failure: Secondary | ICD-10-CM | POA: Diagnosis not present

## 2017-06-19 DIAGNOSIS — F411 Generalized anxiety disorder: Secondary | ICD-10-CM | POA: Diagnosis not present

## 2017-06-19 DIAGNOSIS — J449 Chronic obstructive pulmonary disease, unspecified: Secondary | ICD-10-CM | POA: Diagnosis not present

## 2017-06-19 DIAGNOSIS — F339 Major depressive disorder, recurrent, unspecified: Secondary | ICD-10-CM | POA: Diagnosis not present

## 2017-06-19 DIAGNOSIS — I110 Hypertensive heart disease with heart failure: Secondary | ICD-10-CM | POA: Diagnosis not present

## 2017-06-19 DIAGNOSIS — I350 Nonrheumatic aortic (valve) stenosis: Secondary | ICD-10-CM | POA: Diagnosis not present

## 2017-06-25 DIAGNOSIS — I5032 Chronic diastolic (congestive) heart failure: Secondary | ICD-10-CM | POA: Diagnosis not present

## 2017-06-25 DIAGNOSIS — J9611 Chronic respiratory failure with hypoxia: Secondary | ICD-10-CM | POA: Diagnosis not present

## 2017-06-25 DIAGNOSIS — I110 Hypertensive heart disease with heart failure: Secondary | ICD-10-CM | POA: Diagnosis not present

## 2017-06-25 DIAGNOSIS — I350 Nonrheumatic aortic (valve) stenosis: Secondary | ICD-10-CM | POA: Diagnosis not present

## 2017-06-25 DIAGNOSIS — J449 Chronic obstructive pulmonary disease, unspecified: Secondary | ICD-10-CM | POA: Diagnosis not present

## 2017-06-25 DIAGNOSIS — I5023 Acute on chronic systolic (congestive) heart failure: Secondary | ICD-10-CM | POA: Diagnosis not present

## 2017-06-26 DIAGNOSIS — I5032 Chronic diastolic (congestive) heart failure: Secondary | ICD-10-CM | POA: Diagnosis not present

## 2017-06-26 DIAGNOSIS — I110 Hypertensive heart disease with heart failure: Secondary | ICD-10-CM | POA: Diagnosis not present

## 2017-06-26 DIAGNOSIS — I5023 Acute on chronic systolic (congestive) heart failure: Secondary | ICD-10-CM | POA: Diagnosis not present

## 2017-06-26 DIAGNOSIS — J9611 Chronic respiratory failure with hypoxia: Secondary | ICD-10-CM | POA: Diagnosis not present

## 2017-06-26 DIAGNOSIS — I350 Nonrheumatic aortic (valve) stenosis: Secondary | ICD-10-CM | POA: Diagnosis not present

## 2017-06-26 DIAGNOSIS — J449 Chronic obstructive pulmonary disease, unspecified: Secondary | ICD-10-CM | POA: Diagnosis not present

## 2017-06-27 DIAGNOSIS — J9611 Chronic respiratory failure with hypoxia: Secondary | ICD-10-CM | POA: Diagnosis not present

## 2017-06-27 DIAGNOSIS — I5023 Acute on chronic systolic (congestive) heart failure: Secondary | ICD-10-CM | POA: Diagnosis not present

## 2017-06-27 DIAGNOSIS — J449 Chronic obstructive pulmonary disease, unspecified: Secondary | ICD-10-CM | POA: Diagnosis not present

## 2017-06-27 DIAGNOSIS — I350 Nonrheumatic aortic (valve) stenosis: Secondary | ICD-10-CM | POA: Diagnosis not present

## 2017-06-27 DIAGNOSIS — I5032 Chronic diastolic (congestive) heart failure: Secondary | ICD-10-CM | POA: Diagnosis not present

## 2017-06-27 DIAGNOSIS — I110 Hypertensive heart disease with heart failure: Secondary | ICD-10-CM | POA: Diagnosis not present

## 2017-06-30 DIAGNOSIS — I5023 Acute on chronic systolic (congestive) heart failure: Secondary | ICD-10-CM | POA: Diagnosis not present

## 2017-06-30 DIAGNOSIS — F4322 Adjustment disorder with anxiety: Secondary | ICD-10-CM | POA: Diagnosis not present

## 2017-06-30 DIAGNOSIS — F411 Generalized anxiety disorder: Secondary | ICD-10-CM | POA: Diagnosis not present

## 2017-06-30 DIAGNOSIS — F339 Major depressive disorder, recurrent, unspecified: Secondary | ICD-10-CM | POA: Diagnosis not present

## 2017-06-30 DIAGNOSIS — I350 Nonrheumatic aortic (valve) stenosis: Secondary | ICD-10-CM | POA: Diagnosis not present

## 2017-06-30 DIAGNOSIS — Z7901 Long term (current) use of anticoagulants: Secondary | ICD-10-CM | POA: Diagnosis not present

## 2017-06-30 DIAGNOSIS — I5032 Chronic diastolic (congestive) heart failure: Secondary | ICD-10-CM | POA: Diagnosis not present

## 2017-06-30 DIAGNOSIS — I482 Chronic atrial fibrillation: Secondary | ICD-10-CM | POA: Diagnosis not present

## 2017-06-30 DIAGNOSIS — I110 Hypertensive heart disease with heart failure: Secondary | ICD-10-CM | POA: Diagnosis not present

## 2017-06-30 DIAGNOSIS — J449 Chronic obstructive pulmonary disease, unspecified: Secondary | ICD-10-CM | POA: Diagnosis not present

## 2017-06-30 DIAGNOSIS — J9611 Chronic respiratory failure with hypoxia: Secondary | ICD-10-CM | POA: Diagnosis not present

## 2017-07-07 ENCOUNTER — Other Ambulatory Visit: Payer: Self-pay

## 2017-07-07 ENCOUNTER — Telehealth: Payer: Self-pay | Admitting: Interventional Cardiology

## 2017-07-07 MED ORDER — APIXABAN 2.5 MG PO TABS: 2.5000 mg | ORAL_TABLET | Freq: Two times a day (BID) | ORAL | 1 refills | Status: AC

## 2017-07-07 NOTE — Telephone Encounter (Signed)
I have placed a [employer] Pt assistance application in our mail bin addressed to the pt.

## 2017-07-07 NOTE — Telephone Encounter (Signed)
New message     Please send out form for assistance for the Eliquis they did not receive them this year .

## 2017-10-04 ENCOUNTER — Other Ambulatory Visit: Payer: Self-pay | Admitting: Interventional Cardiology

## 2017-10-10 ENCOUNTER — Telehealth: Payer: Self-pay | Admitting: Interventional Cardiology

## 2017-10-10 NOTE — Telephone Encounter (Signed)
New Message   Patients daughter is calling to advise that her mother is on hospice care. She just wanted [place] to know.

## 2018-05-31 DEATH — deceased
